# Patient Record
Sex: Female | Born: 1955 | Race: Black or African American | Hispanic: No | Marital: Single | Smoking: Never smoker
Health system: Southern US, Community
[De-identification: ages and names within clinical notes are randomized; demographics above are authoritative.]

## PROBLEM LIST (undated history)

## (undated) DIAGNOSIS — R569 Unspecified convulsions: Secondary | ICD-10-CM

## (undated) DIAGNOSIS — I10 Essential (primary) hypertension: Secondary | ICD-10-CM

## (undated) DIAGNOSIS — R4182 Altered mental status, unspecified: Secondary | ICD-10-CM

## (undated) DIAGNOSIS — I639 Cerebral infarction, unspecified: Secondary | ICD-10-CM

## (undated) DIAGNOSIS — R531 Weakness: Secondary | ICD-10-CM

## (undated) DIAGNOSIS — G4700 Insomnia, unspecified: Secondary | ICD-10-CM

## (undated) HISTORY — DX: Altered mental status, unspecified: R41.82

## (undated) HISTORY — DX: Unspecified convulsions: R56.9

## (undated) HISTORY — DX: Essential (primary) hypertension: I10

## (undated) HISTORY — DX: Insomnia, unspecified: G47.00

## (undated) HISTORY — DX: Weakness: R53.1

## (undated) HISTORY — DX: Cerebral infarction, unspecified: I63.9

---

## 2006-11-25 ENCOUNTER — Emergency Department (HOSPITAL_COMMUNITY): Admission: EM | Admit: 2006-11-25 | Discharge: 2006-11-25 | Payer: Self-pay | Admitting: Emergency Medicine

## 2006-12-02 ENCOUNTER — Emergency Department (HOSPITAL_COMMUNITY): Admission: EM | Admit: 2006-12-02 | Discharge: 2006-12-02 | Payer: Self-pay | Admitting: Family Medicine

## 2011-01-12 LAB — POCT I-STAT CREATININE: Creatinine, Ser: 0.9

## 2011-01-12 LAB — I-STAT 8, (EC8 V) (CONVERTED LAB)
Chloride: 105
HCT: 42
Hemoglobin: 14.3
Operator id: 294511
Potassium: 3.3 — ABNORMAL LOW

## 2013-04-08 ENCOUNTER — Emergency Department (HOSPITAL_COMMUNITY)
Admission: EM | Admit: 2013-04-08 | Discharge: 2013-04-08 | Disposition: A | Discharge status: Home / Self Care | Payer: No Typology Code available for payment source | Attending: Emergency Medicine | Admitting: Emergency Medicine

## 2013-04-08 DIAGNOSIS — S0993XA Unspecified injury of face, initial encounter: Secondary | ICD-10-CM | POA: Insufficient documentation

## 2013-04-08 DIAGNOSIS — S199XXA Unspecified injury of neck, initial encounter: Principal | ICD-10-CM

## 2013-04-08 DIAGNOSIS — Y92410 Unspecified street and highway as the place of occurrence of the external cause: Secondary | ICD-10-CM | POA: Insufficient documentation

## 2013-04-08 DIAGNOSIS — Y9389 Activity, other specified: Secondary | ICD-10-CM | POA: Insufficient documentation

## 2013-04-08 MED ORDER — NAPROXEN 250 MG PO TABS
500.0000 mg | ORAL_TABLET | Freq: Once | ORAL | Status: AC
  Administered 2013-04-08: 500 mg via ORAL
  Filled 2013-04-08: qty 2
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
## 2013-04-09 NOTE — ED Provider Notes (Signed)
Medical screening examination/treatment/procedure(s) were performed by non-physician practitioner and as supervising physician I was immediately available for consultation/collaboration.  EKG Interpretation   None         Candyce ChurnJohn David Navjot Loera, MD 04/09/13 1454

## 2013-04-15 ENCOUNTER — Other Ambulatory Visit (HOSPITAL_COMMUNITY): Payer: Self-pay | Admitting: Chiropractic Medicine

## 2013-04-15 ENCOUNTER — Ambulatory Visit (HOSPITAL_COMMUNITY)
Admission: RE | Admit: 2013-04-15 | Discharge: 2013-04-15 | Disposition: A | Discharge status: Home / Self Care | Payer: Self-pay | Source: Ambulatory Visit | Attending: Chiropractic Medicine | Admitting: Chiropractic Medicine

## 2013-04-15 DIAGNOSIS — M542 Cervicalgia: Secondary | ICD-10-CM

## 2013-04-15 DIAGNOSIS — M47812 Spondylosis without myelopathy or radiculopathy, cervical region: Secondary | ICD-10-CM | POA: Insufficient documentation

## 2013-04-15 DIAGNOSIS — M4310 Spondylolisthesis, site unspecified: Secondary | ICD-10-CM | POA: Insufficient documentation

## 2013-10-08 ENCOUNTER — Encounter (HOSPITAL_COMMUNITY): Payer: Self-pay | Admitting: Emergency Medicine

## 2013-10-08 DIAGNOSIS — M25569 Pain in unspecified knee: Secondary | ICD-10-CM | POA: Insufficient documentation

## 2013-10-08 DIAGNOSIS — I10 Essential (primary) hypertension: Secondary | ICD-10-CM | POA: Insufficient documentation

## 2013-10-08 NOTE — ED Notes (Signed)
Pt. reports chronic pain at back of left knee for several years worse these past several days , denies injury / no fall , ambulatory.

## 2013-10-09 ENCOUNTER — Emergency Department (HOSPITAL_COMMUNITY)
Admission: EM | Admit: 2013-10-09 | Discharge: 2013-10-09 | Disposition: A | Discharge status: Home / Self Care | Payer: BC Managed Care – PPO | Attending: Emergency Medicine | Admitting: Emergency Medicine

## 2013-10-09 DIAGNOSIS — M25562 Pain in left knee: Secondary | ICD-10-CM

## 2013-10-09 DIAGNOSIS — I10 Essential (primary) hypertension: Secondary | ICD-10-CM

## 2013-10-09 MED ORDER — ACETAMINOPHEN 500 MG PO TABS: 500.0000 mg | ORAL_TABLET | Freq: Four times a day (QID) | ORAL | Status: DC | PRN

## 2013-10-09 MED ORDER — ACETAMINOPHEN 500 MG PO TABS
1000.0000 mg | ORAL_TABLET | Freq: Once | ORAL | Status: AC
  Administered 2013-10-09: 1000 mg via ORAL
  Filled 2013-10-09: qty 2

## 2013-10-09 NOTE — Discharge Instructions (Signed)
Hypertension Hypertension is another name for high blood pressure. High blood pressure forces your heart to work harder to pump blood. A blood pressure reading has two numbers, which includes a higher number over a lower number (example: 110/72). HOME CARE   Have your blood pressure rechecked by your doctor.  Only take medicine as told by your doctor. Follow the directions carefully. The medicine does not work as well if you skip doses. Skipping doses also puts you at risk for problems.  Do not smoke.  Monitor your blood pressure at home as told by your doctor. GET HELP IF:  You think you are having a reaction to the medicine you are taking.  You have repeat headaches or feel dizzy.  You have puffiness (swelling) in your ankles.  You have trouble with your vision. GET HELP RIGHT AWAY IF:   You get a very bad headache and are confused.  You feel weak, numb, or faint.  You get chest or belly (abdominal) pain.  You throw up (vomit).  You cannot breathe very well. MAKE SURE YOU:   Understand these instructions.  Will watch your condition.  Will get help right away if you are not doing well or get worse. Document Released: 09/05/2007 Document Revised: 03/24/2013 Document Reviewed: 01/09/2013 [facility] Patient Information 2015 [place], [place]. This information is not intended to replace advice given to you by your health care provider. Make sure you discuss any questions you have with your health care provider.  Knee Pain Knee pain can be a result of an injury or other medical conditions. Treatment will depend on the cause of your pain. HOME CARE  Only take medicine as told by your doctor.  Keep a healthy weight. Being overweight can make the knee hurt more.  Stretch before exercising or playing sports.  If there is constant knee pain, change the way you exercise. Ask your doctor for advice.  Make sure shoes fit well. Choose the right shoe for the sport or  activity.  Protect your knees. Wear kneepads if needed.  Rest when you are tired. GET HELP RIGHT AWAY IF:   Your knee pain does not stop.  Your knee pain does not get better.  Your knee joint feels hot to the touch.  You have a fever. MAKE SURE YOU:   Understand these instructions.  Will watch this condition.  Will get help right away if you are not doing well or get worse. Document Released: 06/15/2008 Document Revised: 06/11/2011 Document Reviewed: 06/15/2008 [facility]ExitCare Patient Information 2015 [place]Care, [place]LLC. This information is not intended to replace advice given to you by your health care provider. Make sure you discuss any questions you have with your health care provider.

## 2013-10-09 NOTE — ED Provider Notes (Signed)
CSN: [id number]     Arrival date & time 10/08/13  2316 History   First MD Initiated Contact with Patient 10/09/13 0024     Chief Complaint  Patient presents with  . Leg Pain     (Consider location/radiation/quality/duration/timing/severity/associated sxs/prior Treatment) HPI Comments: Patient is an otherwise healthy 58 year old female who presents today with left posterior knee pain. She reports the pain started yesterday. There was no trauma to the area. The pain is worse with movement, walking, and palpation. This is the same pain she had many years ago. She took tylenol with relief of her symptoms. The pain is dull and achy. No prior history of DVT or PE, no long trips or recent surgeries, no exogenous estrogen, no history of cancer. Patient denies shortness of breath, chest pain, fevers, chills, paresthesias, headache. Patient also was noted to be very hypertensive in triage. Patient reports she does not have history of hypertension, but believes today it is due to her eating a large amount of salt prior to arrival. No CP, SOB, lightheadedness, dizziness, headache.  The history is provided by the patient. No language interpreter was used.    History reviewed. No pertinent past medical history. Past Surgical History  Procedure Laterality Date  . Cesarean section     No family history on file. History  Substance Use Topics  . Smoking status: Never Smoker   . Smokeless tobacco: Not on file  . Alcohol Use: No   OB History   Grav Para Term Preterm Abortions TAB SAB Ect Mult Living                 Review of Systems  Constitutional: Negative for fever and chills.  Respiratory: Negative for shortness of breath.   Cardiovascular: Negative for chest pain and leg swelling.  Musculoskeletal: Positive for arthralgias.  Neurological: Negative for dizziness, weakness, light-headedness, numbness and headaches.  All other systems reviewed and are negative.     Allergies  Review of  patient's allergies indicates no known allergies.  Home Medications   Prior to Admission medications   Medication Sig Start Date End Date Taking? Authorizing Provider  Aspirin-Salicylamide-Caffeine (BC HEADACHE POWDER PO) Take 1 packet by mouth daily as needed (headache).    Historical Provider, MD   BP 177/100  Pulse 107  Temp(Src) 97.1 F (36.2 C) (Oral)  Resp 18  Ht 5\' 1"  (1.549 m)  Wt 163 lb (73.936 kg)  BMI 30.81 kg/m2  SpO2 100% Physical Exam  Nursing note and vitals reviewed. Constitutional: She is oriented to person, place, and time. She appears well-developed and well-nourished. No distress.  HENT:  Head: Normocephalic and atraumatic.  Right Ear: External ear normal.  Left Ear: External ear normal.  Nose: Nose normal.  Mouth/Throat: Oropharynx is clear and moist.  Eyes: Conjunctivae and EOM are normal. Pupils are equal, round, and reactive to light.  Neck: Normal range of motion. No rigidity.  Cardiovascular: Normal rate, regular rhythm, normal heart sounds, intact distal pulses and normal pulses.   Pulses:      Radial pulses are 2+ on the right side, and 2+ on the left side.       Dorsalis pedis pulses are 2+ on the right side, and 2+ on the left side.       Posterior tibial pulses are 2+ on the right side, and 2+ on the left side.  Pulmonary/Chest: Effort normal and breath sounds normal. No stridor. No respiratory distress. She has no wheezes. She has  no rales.  Abdominal: Soft. She exhibits no distension.  Musculoskeletal: Normal range of motion.  Strength 5/5 in lower extremities bilaterally Tender to palpation to posterior left knee. Joint stable, neurovascularly intact. Compartment soft. No erythema or lesion No calf tenderness or leg swelling.   Neurological: She is alert and oriented to person, place, and time. She has normal strength. No sensory deficit. Coordination and gait normal.  Normal gait  Skin: Skin is warm and dry. She is not diaphoretic. No  erythema.  Psychiatric: She has a normal mood and affect. Her behavior is normal.    ED Course  Procedures (including critical care time) Labs Review Labs Reviewed - No data to display  Imaging Review No results found.   EKG Interpretation None      MDM   Final diagnoses:  Left knee pain  Essential hypertension    Patient presents to ED for evaluation of left knee pain. She denies injury or trauma to the area. There is no associated swelling. Normal gait. Patient is Well's negative for DVT. Pain improves with tylenol. I do not believe further imaging will benefit this patient at this time. She was given tylenol in the ED. She requested note for work which was granted. Patient also with hypertension. Patient is asymptomatic. Neuro exam is non focal. I had a lengthy discussion with patient about hypertension and the potential for untreated hypertension to be fatal. She was given a resource guide and referral to the Health and [employer]. She does not have previously documented hypertension and believes her currently hypertension is due to eating a large amount of salt prior to arrival. Discussed low salt diet and the importance of follow up. Discussed reasons to return to ED immediately. Vital signs stable for discharge. Patient / Family / Caregiver informed of clinical course, understand medical decision-making process, and agree with plan.    Mora Bellman, PA-C 10/09/13 1310

## 2013-10-19 NOTE — ED Provider Notes (Signed)
Medical screening examination/treatment/procedure(s) were performed by non-physician practitioner and as supervising physician I was immediately available for consultation/collaboration.   EKG Interpretation None        Julie Manly, MD 10/19/13 0756 

## 2017-08-14 ENCOUNTER — Emergency Department (HOSPITAL_COMMUNITY): Payer: Medicaid Other

## 2017-08-14 ENCOUNTER — Encounter (HOSPITAL_COMMUNITY): Payer: Self-pay

## 2017-08-14 ENCOUNTER — Inpatient Hospital Stay (HOSPITAL_COMMUNITY)
Admission: EM | Admit: 2017-08-14 | Discharge: 2017-09-03 | DRG: 020 | Disposition: A | Discharge status: Rehabilitation Facility | Payer: Medicaid Other | Attending: Neurosurgery | Admitting: Neurosurgery

## 2017-08-14 ENCOUNTER — Inpatient Hospital Stay (HOSPITAL_COMMUNITY): Payer: Medicaid Other

## 2017-08-14 ENCOUNTER — Inpatient Hospital Stay (HOSPITAL_COMMUNITY): Payer: Medicaid Other | Admitting: Anesthesiology

## 2017-08-14 ENCOUNTER — Inpatient Hospital Stay (HOSPITAL_COMMUNITY)
Admission: EM | Disposition: A | Discharge status: Rehabilitation Facility | Payer: Self-pay | Source: Home / Self Care | Attending: Neurosurgery

## 2017-08-14 ENCOUNTER — Other Ambulatory Visit: Payer: Self-pay

## 2017-08-14 DIAGNOSIS — I619 Nontraumatic intracerebral hemorrhage, unspecified: Secondary | ICD-10-CM | POA: Diagnosis present

## 2017-08-14 DIAGNOSIS — R402362 Coma scale, best motor response, obeys commands, at arrival to emergency department: Secondary | ICD-10-CM | POA: Diagnosis present

## 2017-08-14 DIAGNOSIS — E876 Hypokalemia: Secondary | ICD-10-CM | POA: Diagnosis not present

## 2017-08-14 DIAGNOSIS — E870 Hyperosmolality and hypernatremia: Secondary | ICD-10-CM | POA: Diagnosis not present

## 2017-08-14 DIAGNOSIS — J9811 Atelectasis: Secondary | ICD-10-CM | POA: Diagnosis not present

## 2017-08-14 DIAGNOSIS — R739 Hyperglycemia, unspecified: Secondary | ICD-10-CM | POA: Diagnosis not present

## 2017-08-14 DIAGNOSIS — I63332 Cerebral infarction due to thrombosis of left posterior cerebral artery: Secondary | ICD-10-CM | POA: Diagnosis present

## 2017-08-14 DIAGNOSIS — D6489 Other specified anemias: Secondary | ICD-10-CM | POA: Diagnosis present

## 2017-08-14 DIAGNOSIS — J9601 Acute respiratory failure with hypoxia: Secondary | ICD-10-CM

## 2017-08-14 DIAGNOSIS — D62 Acute posthemorrhagic anemia: Secondary | ICD-10-CM | POA: Diagnosis present

## 2017-08-14 DIAGNOSIS — I6032 Nontraumatic subarachnoid hemorrhage from left posterior communicating artery: Secondary | ICD-10-CM | POA: Diagnosis present

## 2017-08-14 DIAGNOSIS — G919 Hydrocephalus, unspecified: Secondary | ICD-10-CM | POA: Diagnosis present

## 2017-08-14 DIAGNOSIS — Y92234 Operating room of hospital as the place of occurrence of the external cause: Secondary | ICD-10-CM | POA: Diagnosis not present

## 2017-08-14 DIAGNOSIS — I609 Nontraumatic subarachnoid hemorrhage, unspecified: Secondary | ICD-10-CM

## 2017-08-14 DIAGNOSIS — H02402 Unspecified ptosis of left eyelid: Secondary | ICD-10-CM | POA: Diagnosis not present

## 2017-08-14 DIAGNOSIS — R402232 Coma scale, best verbal response, inappropriate words, at arrival to emergency department: Secondary | ICD-10-CM | POA: Diagnosis present

## 2017-08-14 DIAGNOSIS — R402132 Coma scale, eyes open, to sound, at arrival to emergency department: Secondary | ICD-10-CM | POA: Diagnosis present

## 2017-08-14 DIAGNOSIS — I611 Nontraumatic intracerebral hemorrhage in hemisphere, cortical: Secondary | ICD-10-CM

## 2017-08-14 DIAGNOSIS — G9389 Other specified disorders of brain: Secondary | ICD-10-CM | POA: Diagnosis present

## 2017-08-14 DIAGNOSIS — I639 Cerebral infarction, unspecified: Secondary | ICD-10-CM | POA: Diagnosis not present

## 2017-08-14 DIAGNOSIS — Z4659 Encounter for fitting and adjustment of other gastrointestinal appliance and device: Secondary | ICD-10-CM

## 2017-08-14 DIAGNOSIS — R7303 Prediabetes: Secondary | ICD-10-CM

## 2017-08-14 DIAGNOSIS — R0682 Tachypnea, not elsewhere classified: Secondary | ICD-10-CM

## 2017-08-14 DIAGNOSIS — E46 Unspecified protein-calorie malnutrition: Secondary | ICD-10-CM | POA: Diagnosis not present

## 2017-08-14 DIAGNOSIS — R000 Tachycardia, unspecified: Secondary | ICD-10-CM

## 2017-08-14 DIAGNOSIS — D649 Anemia, unspecified: Secondary | ICD-10-CM | POA: Diagnosis not present

## 2017-08-14 DIAGNOSIS — S06320A Contusion and laceration of left cerebrum without loss of consciousness, initial encounter: Secondary | ICD-10-CM

## 2017-08-14 DIAGNOSIS — N390 Urinary tract infection, site not specified: Secondary | ICD-10-CM | POA: Diagnosis not present

## 2017-08-14 DIAGNOSIS — I10 Essential (primary) hypertension: Secondary | ICD-10-CM | POA: Diagnosis present

## 2017-08-14 DIAGNOSIS — D72829 Elevated white blood cell count, unspecified: Secondary | ICD-10-CM

## 2017-08-14 DIAGNOSIS — R4702 Dysphasia: Secondary | ICD-10-CM | POA: Diagnosis not present

## 2017-08-14 DIAGNOSIS — R451 Restlessness and agitation: Secondary | ICD-10-CM | POA: Diagnosis not present

## 2017-08-14 DIAGNOSIS — S06350A Traumatic hemorrhage of left cerebrum without loss of consciousness, initial encounter: Secondary | ICD-10-CM

## 2017-08-14 DIAGNOSIS — G8191 Hemiplegia, unspecified affecting right dominant side: Secondary | ICD-10-CM | POA: Diagnosis present

## 2017-08-14 DIAGNOSIS — G935 Compression of brain: Secondary | ICD-10-CM | POA: Diagnosis present

## 2017-08-14 DIAGNOSIS — B9620 Unspecified Escherichia coli [E. coli] as the cause of diseases classified elsewhere: Secondary | ICD-10-CM | POA: Diagnosis present

## 2017-08-14 DIAGNOSIS — I69151 Hemiplegia and hemiparesis following nontraumatic intracerebral hemorrhage affecting right dominant side: Secondary | ICD-10-CM | POA: Diagnosis not present

## 2017-08-14 DIAGNOSIS — I739 Peripheral vascular disease, unspecified: Secondary | ICD-10-CM | POA: Diagnosis not present

## 2017-08-14 DIAGNOSIS — I97811 Intraoperative cerebrovascular infarction during other surgery: Secondary | ICD-10-CM | POA: Diagnosis not present

## 2017-08-14 DIAGNOSIS — R29810 Facial weakness: Secondary | ICD-10-CM | POA: Diagnosis present

## 2017-08-14 DIAGNOSIS — Y838 Other surgical procedures as the cause of abnormal reaction of the patient, or of later complication, without mention of misadventure at the time of the procedure: Secondary | ICD-10-CM | POA: Diagnosis not present

## 2017-08-14 DIAGNOSIS — R509 Fever, unspecified: Secondary | ICD-10-CM

## 2017-08-14 DIAGNOSIS — I615 Nontraumatic intracerebral hemorrhage, intraventricular: Secondary | ICD-10-CM | POA: Diagnosis present

## 2017-08-14 DIAGNOSIS — I161 Hypertensive emergency: Secondary | ICD-10-CM | POA: Diagnosis present

## 2017-08-14 DIAGNOSIS — R29721 NIHSS score 21: Secondary | ICD-10-CM | POA: Diagnosis present

## 2017-08-14 DIAGNOSIS — G936 Cerebral edema: Secondary | ICD-10-CM | POA: Diagnosis present

## 2017-08-14 DIAGNOSIS — F419 Anxiety disorder, unspecified: Secondary | ICD-10-CM | POA: Diagnosis not present

## 2017-08-14 DIAGNOSIS — R1310 Dysphagia, unspecified: Secondary | ICD-10-CM | POA: Diagnosis not present

## 2017-08-14 DIAGNOSIS — R4701 Aphasia: Secondary | ICD-10-CM | POA: Diagnosis not present

## 2017-08-14 HISTORY — PX: CRANIOTOMY: SHX93

## 2017-08-14 LAB — DIFFERENTIAL
Band Neutrophils: 1 %
Basophils Absolute: 0 10*3/uL (ref 0.0–0.1)
Basophils Relative: 0 %
Blasts: 0 %
Eosinophils Absolute: 0.1 10*3/uL (ref 0.0–0.7)
Eosinophils Relative: 1 %
LYMPHS ABS: 6.6 10*3/uL — AB (ref 0.7–4.0)
Lymphocytes Relative: 53 %
MYELOCYTES: 0 %
Metamyelocytes Relative: 0 %
Monocytes Absolute: 0.3 10*3/uL (ref 0.1–1.0)
Monocytes Relative: 2 %
NRBC: 0 /100{WBCs}
Neutro Abs: 5.5 10*3/uL (ref 1.7–7.7)
Neutrophils Relative %: 43 %
Other: 0 %
Promyelocytes Relative: 0 %

## 2017-08-14 LAB — COMPREHENSIVE METABOLIC PANEL
ALT: 16 U/L (ref 14–54)
AST: 22 U/L (ref 15–41)
Albumin: 3.9 g/dL (ref 3.5–5.0)
Alkaline Phosphatase: 66 U/L (ref 38–126)
Anion gap: 12 (ref 5–15)
BILIRUBIN TOTAL: 0.6 mg/dL (ref 0.3–1.2)
BUN: 7 mg/dL (ref 6–20)
CO2: 22 mmol/L (ref 22–32)
CREATININE: 0.86 mg/dL (ref 0.44–1.00)
Calcium: 9.5 mg/dL (ref 8.9–10.3)
Chloride: 105 mmol/L (ref 101–111)
GFR calc Af Amer: >60 mL/min (ref >60)
Glucose, Bld: 188 mg/dL — ABNORMAL HIGH (ref 65–99)
POTASSIUM: 2.9 mmol/L — AB (ref 3.5–5.1)
Sodium: 139 mmol/L (ref 135–145)
TOTAL PROTEIN: 7.5 g/dL (ref 6.5–8.1)

## 2017-08-14 LAB — RAPID URINE DRUG SCREEN, HOSP PERFORMED
Amphetamines: NOT DETECTED
Barbiturates: NOT DETECTED
Benzodiazepines: NOT DETECTED
Cocaine: NOT DETECTED
Opiates: NOT DETECTED
Tetrahydrocannabinol: NOT DETECTED

## 2017-08-14 LAB — I-STAT TROPONIN, ED: TROPONIN I, POC: 0 ng/mL (ref 0.00–0.08)

## 2017-08-14 LAB — URINALYSIS, ROUTINE W REFLEX MICROSCOPIC
BACTERIA UA: NONE SEEN
Bilirubin Urine: NEGATIVE
Glucose, UA: 150 mg/dL — AB
KETONES UR: 5 mg/dL — AB
LEUKOCYTES UA: NEGATIVE
NITRITE: NEGATIVE
PROTEIN: NEGATIVE mg/dL
Specific Gravity, Urine: 1.008 (ref 1.005–1.030)
pH: 6 (ref 5.0–8.0)

## 2017-08-14 LAB — CBC
HEMATOCRIT: 35.8 % — AB (ref 36.0–46.0)
HEMOGLOBIN: 11.8 g/dL — AB (ref 12.0–15.0)
MCH: 29.2 pg (ref 26.0–34.0)
MCHC: 33 g/dL (ref 30.0–36.0)
MCV: 88.6 fL (ref 78.0–100.0)
Platelets: 281 10*3/uL (ref 150–400)
RBC: 4.04 MIL/uL (ref 3.87–5.11)
RDW: 13.9 % (ref 11.5–15.5)
WBC: 12.5 10*3/uL — ABNORMAL HIGH (ref 4.0–10.5)

## 2017-08-14 LAB — I-STAT CHEM 8, ED
BUN: 7 mg/dL (ref 6–20)
CHLORIDE: 106 mmol/L (ref 101–111)
Calcium, Ion: 1.22 mmol/L (ref 1.15–1.40)
Creatinine, Ser: 0.6 mg/dL (ref 0.44–1.00)
GLUCOSE: 189 mg/dL — AB (ref 65–99)
HEMATOCRIT: 37 % (ref 36.0–46.0)
Hemoglobin: 12.6 g/dL (ref 12.0–15.0)
POTASSIUM: 2.9 mmol/L — AB (ref 3.5–5.1)
SODIUM: 143 mmol/L (ref 135–145)
TCO2: 24 mmol/L (ref 22–32)

## 2017-08-14 LAB — BASIC METABOLIC PANEL
ANION GAP: 11 (ref 5–15)
BUN: 5 mg/dL — ABNORMAL LOW (ref 6–20)
CO2: 21 mmol/L — AB (ref 22–32)
Calcium: 8.7 mg/dL — ABNORMAL LOW (ref 8.9–10.3)
Chloride: 111 mmol/L (ref 101–111)
Creatinine, Ser: 0.7 mg/dL (ref 0.44–1.00)
GFR calc non Af Amer: >60 mL/min (ref >60)
Glucose, Bld: 186 mg/dL — ABNORMAL HIGH (ref 65–99)
POTASSIUM: 3.6 mmol/L (ref 3.5–5.1)
Sodium: 143 mmol/L (ref 135–145)

## 2017-08-14 LAB — CBG MONITORING, ED: GLUCOSE-CAPILLARY: 223 mg/dL — AB (ref 65–99)

## 2017-08-14 LAB — GLUCOSE, CAPILLARY
GLUCOSE-CAPILLARY: 149 mg/dL — AB (ref 65–99)
GLUCOSE-CAPILLARY: 141 mg/dL — AB (ref 65–99)

## 2017-08-14 LAB — MRSA PCR SCREENING: MRSA BY PCR: NEGATIVE

## 2017-08-14 LAB — TRIGLYCERIDES: Triglycerides: 41 mg/dL (ref <150)

## 2017-08-14 LAB — ABO/RH: ABO/RH(D): B POS

## 2017-08-14 LAB — APTT: APTT: 25 s (ref 24–36)

## 2017-08-14 LAB — SURGICAL PCR SCREEN
MRSA, PCR: NEGATIVE
Staphylococcus aureus: NEGATIVE

## 2017-08-14 LAB — PREPARE RBC (CROSSMATCH)

## 2017-08-14 LAB — PROTIME-INR
INR: 1.01
Prothrombin Time: 13.2 seconds (ref 11.4–15.2)

## 2017-08-14 LAB — ETHANOL

## 2017-08-14 SURGERY — CRANIOTOMY INTRACRANIAL ANEURYSM FOR CAROTID
Anesthesia: General | Site: Head | Laterality: Left

## 2017-08-14 MED ORDER — EPHEDRINE SULFATE 50 MG/ML IJ SOLN
INTRAMUSCULAR | Status: AC
  Filled 2017-08-14: qty 1

## 2017-08-14 MED ORDER — ALBUMIN HUMAN 5 % IV SOLN
INTRAVENOUS | Status: DC | PRN
  Administered 2017-08-14: 14:00:00 via INTRAVENOUS

## 2017-08-14 MED ORDER — SODIUM CHLORIDE 0.9 % IV SOLN
INTRAVENOUS | Status: DC | PRN
  Administered 2017-08-14: 1000 mg via INTRAVENOUS

## 2017-08-14 MED ORDER — SUGAMMADEX SODIUM 200 MG/2ML IV SOLN
INTRAVENOUS | Status: AC
  Filled 2017-08-14: qty 2

## 2017-08-14 MED ORDER — DEXAMETHASONE SODIUM PHOSPHATE 10 MG/ML IJ SOLN
INTRAMUSCULAR | Status: AC
  Filled 2017-08-14: qty 1

## 2017-08-14 MED ORDER — 0.9 % SODIUM CHLORIDE (POUR BTL) OPTIME
TOPICAL | Status: DC | PRN
  Administered 2017-08-14 (×3): 1000 mL

## 2017-08-14 MED ORDER — ONDANSETRON HCL 4 MG/2ML IJ SOLN
INTRAMUSCULAR | Status: AC
  Filled 2017-08-14: qty 2

## 2017-08-14 MED ORDER — LIDOCAINE 2% (20 MG/ML) 5 ML SYRINGE
INTRAMUSCULAR | Status: AC
  Filled 2017-08-14: qty 5

## 2017-08-14 MED ORDER — ORAL CARE MOUTH RINSE
15.0000 mL | Freq: Four times a day (QID) | OROMUCOSAL | Status: DC
  Administered 2017-08-14 – 2017-08-15 (×2): 15 mL via OROMUCOSAL

## 2017-08-14 MED ORDER — SODIUM CHLORIDE 0.9 % IV SOLN
INTRAVENOUS | Status: DC | PRN
  Administered 2017-08-14: 500 mL

## 2017-08-14 MED ORDER — BACITRACIN ZINC 500 UNIT/GM EX OINT
TOPICAL_OINTMENT | CUTANEOUS | Status: DC | PRN
  Administered 2017-08-14: 1 via TOPICAL

## 2017-08-14 MED ORDER — ROCURONIUM BROMIDE 50 MG/5ML IV SOLN
INTRAVENOUS | Status: AC
  Filled 2017-08-14: qty 1

## 2017-08-14 MED ORDER — LABETALOL HCL 5 MG/ML IV SOLN
20.0000 mg | Freq: Once | INTRAVENOUS | Status: AC
  Administered 2017-08-14: 20 mg via INTRAVENOUS

## 2017-08-14 MED ORDER — SODIUM CHLORIDE 0.9 % IV SOLN
INTRAVENOUS | Status: DC
  Administered 2017-08-14 – 2017-08-19 (×8): via INTRAVENOUS

## 2017-08-14 MED ORDER — STROKE: EARLY STAGES OF RECOVERY BOOK
Freq: Once | Status: DC
  Filled 2017-08-14: qty 1

## 2017-08-14 MED ORDER — SUGAMMADEX SODIUM 500 MG/5ML IV SOLN
INTRAVENOUS | Status: DC | PRN
  Administered 2017-08-14: 139.6 mg via INTRAVENOUS

## 2017-08-14 MED ORDER — ACETAMINOPHEN 325 MG PO TABS: 650.0000 mg | ORAL_TABLET | ORAL | Status: DC | PRN

## 2017-08-14 MED ORDER — FENTANYL CITRATE (PF) 250 MCG/5ML IJ SOLN
INTRAMUSCULAR | Status: AC
  Filled 2017-08-14: qty 5

## 2017-08-14 MED ORDER — CEFAZOLIN SODIUM 1 G IJ SOLR
INTRAMUSCULAR | Status: AC
  Filled 2017-08-14: qty 20

## 2017-08-14 MED ORDER — ONDANSETRON HCL 4 MG/2ML IJ SOLN
INTRAMUSCULAR | Status: DC | PRN
  Administered 2017-08-14: 4 mg via INTRAVENOUS

## 2017-08-14 MED ORDER — CHLORHEXIDINE GLUCONATE 0.12% ORAL RINSE (MEDLINE KIT)
15.0000 mL | Freq: Two times a day (BID) | OROMUCOSAL | Status: DC
  Administered 2017-08-14 – 2017-08-29 (×29): 15 mL via OROMUCOSAL

## 2017-08-14 MED ORDER — DEXAMETHASONE SODIUM PHOSPHATE 10 MG/ML IJ SOLN
INTRAMUSCULAR | Status: DC | PRN
  Administered 2017-08-14: 10 mg via INTRAVENOUS

## 2017-08-14 MED ORDER — POTASSIUM CHLORIDE 10 MEQ/100ML IV SOLN
10.0000 meq | INTRAVENOUS | Status: AC
  Administered 2017-08-14 (×2): 10 meq via INTRAVENOUS
  Filled 2017-08-14 (×4): qty 100

## 2017-08-14 MED ORDER — FENTANYL CITRATE (PF) 100 MCG/2ML IJ SOLN
25.0000 ug | INTRAMUSCULAR | Status: DC | PRN
  Administered 2017-08-14 – 2017-09-03 (×2): 50 ug via INTRAVENOUS
  Filled 2017-08-14 (×4): qty 2

## 2017-08-14 MED ORDER — PHENYLEPHRINE 40 MCG/ML (10ML) SYRINGE FOR IV PUSH (FOR BLOOD PRESSURE SUPPORT)
PREFILLED_SYRINGE | INTRAVENOUS | Status: DC | PRN
  Administered 2017-08-14 (×2): 80 ug via INTRAVENOUS
  Administered 2017-08-14: 120 ug via INTRAVENOUS
  Administered 2017-08-14 (×4): 80 ug via INTRAVENOUS

## 2017-08-14 MED ORDER — THROMBIN (RECOMBINANT) 5000 UNITS EX SOLR
OROMUCOSAL | Status: DC | PRN
  Administered 2017-08-14: 5 mL via TOPICAL

## 2017-08-14 MED ORDER — ACETAMINOPHEN 160 MG/5ML PO SOLN
650.0000 mg | ORAL | Status: DC | PRN
  Administered 2017-08-16 – 2017-08-22 (×16): 650 mg
  Filled 2017-08-14 (×17): qty 20.3

## 2017-08-14 MED ORDER — SODIUM CHLORIDE 0.9 % IV SOLN
INTRAVENOUS | Status: DC | PRN
  Administered 2017-08-14 (×2): via INTRAVENOUS

## 2017-08-14 MED ORDER — BACITRACIN ZINC 500 UNIT/GM EX OINT
TOPICAL_OINTMENT | CUTANEOUS | Status: AC
  Filled 2017-08-14: qty 28.35

## 2017-08-14 MED ORDER — THROMBIN 20000 UNITS EX SOLR
CUTANEOUS | Status: AC
  Filled 2017-08-14: qty 20000

## 2017-08-14 MED ORDER — PROPOFOL 10 MG/ML IV BOLUS
INTRAVENOUS | Status: DC | PRN
  Administered 2017-08-14: 140 mg via INTRAVENOUS
  Administered 2017-08-14: 30 mg via INTRAVENOUS
  Administered 2017-08-14: 40 mg via INTRAVENOUS

## 2017-08-14 MED ORDER — BUPIVACAINE HCL 0.5 % IJ SOLN
INTRAMUSCULAR | Status: DC | PRN
  Administered 2017-08-14: 5 mL

## 2017-08-14 MED ORDER — HEMOSTATIC AGENTS (NO CHARGE) OPTIME
TOPICAL | Status: DC | PRN
  Administered 2017-08-14: 1 via TOPICAL

## 2017-08-14 MED ORDER — PROPOFOL 1000 MG/100ML IV EMUL: 5.0000 ug/kg/min | INTRAVENOUS | Status: DC

## 2017-08-14 MED ORDER — POTASSIUM CHLORIDE 10 MEQ/100ML IV SOLN
10.0000 meq | INTRAVENOUS | Status: AC
  Administered 2017-08-14 (×2): 10 meq via INTRAVENOUS
  Filled 2017-08-14 (×2): qty 100

## 2017-08-14 MED ORDER — LABETALOL HCL 5 MG/ML IV SOLN
INTRAVENOUS | Status: DC | PRN
  Administered 2017-08-14 (×2): 5 mg via INTRAVENOUS

## 2017-08-14 MED ORDER — POTASSIUM CHLORIDE 10 MEQ/100ML IV SOLN
INTRAVENOUS | Status: DC | PRN
Start: 2017-08-14 — End: 2017-08-14
  Administered 2017-08-14 (×2): 10 meq via INTRAVENOUS

## 2017-08-14 MED ORDER — LIDOCAINE HCL (PF) 1 % IJ SOLN
INTRAMUSCULAR | Status: AC
  Filled 2017-08-14: qty 30

## 2017-08-14 MED ORDER — ROCURONIUM BROMIDE 100 MG/10ML IV SOLN
INTRAVENOUS | Status: DC | PRN
  Administered 2017-08-14: 40 mg via INTRAVENOUS
  Administered 2017-08-14 (×2): 30 mg via INTRAVENOUS
  Administered 2017-08-14: 10 mg via INTRAVENOUS

## 2017-08-14 MED ORDER — SODIUM CHLORIDE 0.9 % IV SOLN
INTRAVENOUS | Status: DC
  Administered 2017-08-14: 17:00:00 via INTRAVENOUS

## 2017-08-14 MED ORDER — ACETAMINOPHEN 650 MG RE SUPP: 650.0000 mg | RECTAL | Status: DC | PRN

## 2017-08-14 MED ORDER — PHENYLEPHRINE 40 MCG/ML (10ML) SYRINGE FOR IV PUSH (FOR BLOOD PRESSURE SUPPORT)
PREFILLED_SYRINGE | INTRAVENOUS | Status: AC
  Filled 2017-08-14: qty 20

## 2017-08-14 MED ORDER — CEFAZOLIN SODIUM-DEXTROSE 2-3 GM-%(50ML) IV SOLR
INTRAVENOUS | Status: DC | PRN
  Administered 2017-08-14: 2 g via INTRAVENOUS

## 2017-08-14 MED ORDER — LABETALOL HCL 5 MG/ML IV SOLN
INTRAVENOUS | Status: AC
  Filled 2017-08-14: qty 4

## 2017-08-14 MED ORDER — SUCCINYLCHOLINE CHLORIDE 200 MG/10ML IV SOSY
PREFILLED_SYRINGE | INTRAVENOUS | Status: AC
  Filled 2017-08-14: qty 10

## 2017-08-14 MED ORDER — SODIUM CHLORIDE 0.9 % IV SOLN: Freq: Once | INTRAVENOUS | Status: DC

## 2017-08-14 MED ORDER — PROPOFOL 500 MG/50ML IV EMUL
INTRAVENOUS | Status: DC | PRN
  Administered 2017-08-14: 25 ug/kg/min via INTRAVENOUS

## 2017-08-14 MED ORDER — NICARDIPINE HCL IN NACL 20-0.86 MG/200ML-% IV SOLN
3.0000 mg/h | INTRAVENOUS | Status: DC
  Administered 2017-08-14: 5 mg/h via INTRAVENOUS
  Administered 2017-08-14: 7.5 mg/h via INTRAVENOUS
  Administered 2017-08-14: 5 mg/h via INTRAVENOUS
  Administered 2017-08-14: 10 mg/h via INTRAVENOUS
  Administered 2017-08-15: 7.5 mg/h via INTRAVENOUS
  Administered 2017-08-15: 15 mg/h via INTRAVENOUS
  Administered 2017-08-15: 12 mg/h via INTRAVENOUS
  Administered 2017-08-15: 10 mg/h via INTRAVENOUS
  Administered 2017-08-15: 15 mg/h via INTRAVENOUS
  Administered 2017-08-15: 10 mg/h via INTRAVENOUS
  Administered 2017-08-15: 15 mg/h via INTRAVENOUS
  Administered 2017-08-15: 12 mg/h via INTRAVENOUS
  Filled 2017-08-14 (×2): qty 200
  Filled 2017-08-14: qty 400
  Filled 2017-08-14 (×8): qty 200

## 2017-08-14 MED ORDER — IOPAMIDOL (ISOVUE-370) INJECTION 76%
INTRAVENOUS | Status: AC
  Administered 2017-08-14: 50 mL via INTRAVENOUS
  Filled 2017-08-14: qty 50

## 2017-08-14 MED ORDER — PHENYLEPHRINE HCL 10 MG/ML IJ SOLN
INTRAVENOUS | Status: DC | PRN
  Administered 2017-08-14: 30 ug/min via INTRAVENOUS

## 2017-08-14 MED ORDER — INDOCYANINE GREEN 25 MG IV SOLR
12.5000 mg | Freq: Once | INTRAVENOUS | Status: DC
  Filled 2017-08-14: qty 25

## 2017-08-14 MED ORDER — SUCCINYLCHOLINE CHLORIDE 20 MG/ML IJ SOLN
INTRAMUSCULAR | Status: DC | PRN
  Administered 2017-08-14: 120 mg via INTRAVENOUS

## 2017-08-14 MED ORDER — THROMBIN (RECOMBINANT) 20000 UNITS EX SOLR
CUTANEOUS | Status: DC | PRN
  Administered 2017-08-14: 20 mL via TOPICAL

## 2017-08-14 MED ORDER — MANNITOL 25 % IV SOLN
INTRAVENOUS | Status: DC | PRN
  Administered 2017-08-14: 50 g via INTRAVENOUS

## 2017-08-14 MED ORDER — FENTANYL CITRATE (PF) 100 MCG/2ML IJ SOLN
INTRAMUSCULAR | Status: DC | PRN
  Administered 2017-08-14 (×2): 100 ug via INTRAVENOUS
  Administered 2017-08-14: 50 ug via INTRAVENOUS

## 2017-08-14 MED ORDER — THROMBIN 5000 UNITS EX SOLR
CUTANEOUS | Status: AC
  Filled 2017-08-14: qty 5000

## 2017-08-14 MED ORDER — FAMOTIDINE IN NACL 20-0.9 MG/50ML-% IV SOLN
20.0000 mg | Freq: Two times a day (BID) | INTRAVENOUS | Status: DC
  Administered 2017-08-14 – 2017-08-16 (×4): 20 mg via INTRAVENOUS
  Filled 2017-08-14 (×4): qty 50

## 2017-08-14 MED ORDER — SENNOSIDES-DOCUSATE SODIUM 8.6-50 MG PO TABS
1.0000 | ORAL_TABLET | Freq: Two times a day (BID) | ORAL | Status: DC
  Administered 2017-08-15 – 2017-08-16 (×2): 1 via ORAL
  Filled 2017-08-14 (×3): qty 1

## 2017-08-14 MED ORDER — LIDOCAINE HCL (CARDIAC) PF 100 MG/5ML IV SOSY
PREFILLED_SYRINGE | INTRAVENOUS | Status: DC | PRN
  Administered 2017-08-14: 50 mg via INTRAVENOUS

## 2017-08-14 MED ORDER — LIDOCAINE HCL (PF) 1 % IJ SOLN
INTRAMUSCULAR | Status: DC | PRN
  Administered 2017-08-14: 5 mL

## 2017-08-14 MED ORDER — BUPIVACAINE HCL (PF) 0.5 % IJ SOLN
INTRAMUSCULAR | Status: AC
  Filled 2017-08-14: qty 30

## 2017-08-14 SURGICAL SUPPLY — 104 items
BAG DECANTER FOR FLEXI CONT (MISCELLANEOUS) ×3 IMPLANT
BENZOIN TINCTURE PRP APPL 2/3 (GAUZE/BANDAGES/DRESSINGS) IMPLANT
BIT DRILL WIRE PASS 1.3MM (BIT) IMPLANT
BLADE SAW GIGLI 16 STRL (MISCELLANEOUS) IMPLANT
BLADE SURG 15 STRL LF DISP TIS (BLADE) IMPLANT
BLADE SURG 15 STRL SS (BLADE)
BLADE ULTRA TIP 2M (BLADE) IMPLANT
BNDG GAUZE ELAST 4 BULKY (GAUZE/BANDAGES/DRESSINGS) ×6 IMPLANT
BNDG STRETCH 4X75 NS LF (GAUZE/BANDAGES/DRESSINGS) ×3 IMPLANT
BNDG STRETCH 4X75 STRL LF (GAUZE/BANDAGES/DRESSINGS) IMPLANT
BUR ACORN 6.0 PRECISION (BURR) ×2 IMPLANT
BUR ACORN 6.0MM PRECISION (BURR) ×1
BUR MATCHSTICK NEURO 3.0 LAGG (BURR) ×3 IMPLANT
BUR ROUND FLUTED 4 SOFT TCH (BURR) ×2 IMPLANT
BUR ROUND FLUTED 4MM SOFT TCH (BURR) ×1
BUR SPIRAL ROUTER 2.3 (BUR) ×2 IMPLANT
BUR SPIRAL ROUTER 2.3MM (BUR) ×1
CANISTER SUCT 3000ML PPV (MISCELLANEOUS) ×9 IMPLANT
CARTRIDGE OIL MAESTRO DRILL (MISCELLANEOUS) ×1 IMPLANT
CATH VENTRIC 35X38 W/TROCAR LG (CATHETERS) ×3 IMPLANT
CLIP ANEURY TI PERM MINI 6.6M (Clip) ×3 IMPLANT
CLIP VESOCCLUDE MED 6/CT (CLIP) IMPLANT
COVER MAYO STAND STRL (DRAPES) IMPLANT
DECANTER SPIKE VIAL GLASS SM (MISCELLANEOUS) ×3 IMPLANT
DIFFUSER DRILL AIR PNEUMATIC (MISCELLANEOUS) ×3 IMPLANT
DRAPE HALF SHEET 40X57 (DRAPES) ×3 IMPLANT
DRAPE MICROSCOPE LEICA (MISCELLANEOUS) ×3 IMPLANT
DRAPE NEUROLOGICAL W/INCISE (DRAPES) ×3 IMPLANT
DRAPE TABLE COVER HEAVY DUTY (DRAPES) ×3 IMPLANT
DRAPE WARM FLUID 44X44 (DRAPE) ×3 IMPLANT
DRESSING TELFA 8X10 (GAUZE/BANDAGES/DRESSINGS) ×3 IMPLANT
DRILL WIRE PASS 1.3MM (BIT)
DRSG ADAPTIC 3X8 NADH LF (GAUZE/BANDAGES/DRESSINGS) IMPLANT
DRSG TELFA 3X8 NADH (GAUZE/BANDAGES/DRESSINGS) IMPLANT
DURAMATRIX ONLAY 3X3 (Plate) ×3 IMPLANT
DURAPREP 6ML APPLICATOR 50/CS (WOUND CARE) ×3 IMPLANT
ELECT REM PT RETURN 9FT ADLT (ELECTROSURGICAL) ×3
ELECTRODE REM PT RTRN 9FT ADLT (ELECTROSURGICAL) ×1 IMPLANT
EVACUATOR SILICONE 100CC (DRAIN) IMPLANT
FORCEPS BIPOLAR SPETZLER 8 1.0 (NEUROSURGERY SUPPLIES) ×3 IMPLANT
GAUZE SPONGE 4X4 12PLY STRL (GAUZE/BANDAGES/DRESSINGS) IMPLANT
GAUZE SPONGE 4X4 16PLY XRAY LF (GAUZE/BANDAGES/DRESSINGS) ×3 IMPLANT
GLOVE BIO SURGEON STRL SZ7.5 (GLOVE) ×3 IMPLANT
GLOVE BIOGEL PI IND STRL 7.5 (GLOVE) ×3 IMPLANT
GLOVE BIOGEL PI IND STRL 8 (GLOVE) ×2 IMPLANT
GLOVE BIOGEL PI INDICATOR 7.5 (GLOVE) ×6
GLOVE BIOGEL PI INDICATOR 8 (GLOVE) ×4
GLOVE ECLIPSE 6.5 STRL STRAW (GLOVE) ×3 IMPLANT
GLOVE ECLIPSE 7.0 STRL STRAW (GLOVE) ×6 IMPLANT
GLOVE ECLIPSE 7.5 STRL STRAW (GLOVE) ×15 IMPLANT
GOWN STRL REUS W/ TWL LRG LVL3 (GOWN DISPOSABLE) ×3 IMPLANT
GOWN STRL REUS W/ TWL XL LVL3 (GOWN DISPOSABLE) IMPLANT
GOWN STRL REUS W/TWL 2XL LVL3 (GOWN DISPOSABLE) ×3 IMPLANT
GOWN STRL REUS W/TWL LRG LVL3 (GOWN DISPOSABLE) ×6
GOWN STRL REUS W/TWL XL LVL3 (GOWN DISPOSABLE)
HEMOSTAT POWDER KIT SURGIFOAM (HEMOSTASIS) ×3 IMPLANT
HEMOSTAT SURGICEL 2X14 (HEMOSTASIS) ×3 IMPLANT
HOOK DURA (MISCELLANEOUS) ×3 IMPLANT
HOOK DURA 1/2IN (MISCELLANEOUS) IMPLANT
KIT BASIN OR (CUSTOM PROCEDURE TRAY) ×3 IMPLANT
KIT DRAIN CSF ACCUDRAIN (MISCELLANEOUS) ×3 IMPLANT
KIT TURNOVER KIT B (KITS) ×3 IMPLANT
KNIFE ARACHNOID DISP AM-24-S (MISCELLANEOUS) ×3 IMPLANT
MARKER SKIN DUAL TIP RULER LAB (MISCELLANEOUS) ×3 IMPLANT
NEEDLE HYPO 22GX1.5 SAFETY (NEEDLE) ×3 IMPLANT
NEEDLE HYPO 25X1 1.5 SAFETY (NEEDLE) ×3 IMPLANT
NEEDLE SPNL 25GX3.5 QUINCKE BL (NEEDLE) IMPLANT
NS IRRIG 1000ML POUR BTL (IV SOLUTION) ×9 IMPLANT
OIL CARTRIDGE MAESTRO DRILL (MISCELLANEOUS) ×3
PACK CRANIOTOMY CUSTOM (CUSTOM PROCEDURE TRAY) ×3 IMPLANT
PAD ARMBOARD 7.5X6 YLW CONV (MISCELLANEOUS) ×9 IMPLANT
PATTIES SURGICAL .25X.25 (GAUZE/BANDAGES/DRESSINGS) IMPLANT
PATTIES SURGICAL .5 X.5 (GAUZE/BANDAGES/DRESSINGS) IMPLANT
PATTIES SURGICAL .5 X3 (DISPOSABLE) IMPLANT
PATTIES SURGICAL 1/4 X 3 (GAUZE/BANDAGES/DRESSINGS) IMPLANT
PATTIES SURGICAL 1X1 (DISPOSABLE) IMPLANT
PERFORATOR LRG  14-11MM (BIT)
PERFORATOR LRG 14-11MM (BIT) IMPLANT
PIN MAYFIELD SKULL DISP (PIN) ×3 IMPLANT
PLATE 1.5  2HOLE LNG NEURO (Plate) ×4 IMPLANT
PLATE 1.5 2HOLE LNG NEURO (Plate) ×2 IMPLANT
PLATE 1.5 4HOLE LONG STRAIGHT (Plate) ×3 IMPLANT
PLATE 1.5/0.5 4HOLE NEURO REC (Plate) ×3 IMPLANT
RUBBERBAND STERILE (MISCELLANEOUS) ×6 IMPLANT
SCREW SELF DRILL HT 1.5/4MM (Screw) ×30 IMPLANT
SPONGE NEURO XRAY DETECT 1X3 (DISPOSABLE) IMPLANT
SPONGE SURGIFOAM ABS GEL 100 (HEMOSTASIS) ×3 IMPLANT
STAPLER SKIN PROX WIDE 3.9 (STAPLE) ×3 IMPLANT
STAPLER VISISTAT 35W (STAPLE) ×3 IMPLANT
STOCKINETTE 6  STRL (DRAPES) ×2
STOCKINETTE 6 STRL (DRAPES) ×1 IMPLANT
SUT ETHILON 3 0 FSL (SUTURE) IMPLANT
SUT NURALON 4 0 TR CR/8 (SUTURE) ×6 IMPLANT
SUT VIC AB 0 CT1 18XCR BRD8 (SUTURE) ×2 IMPLANT
SUT VIC AB 0 CT1 8-18 (SUTURE) ×4
SUT VIC AB 3-0 SH 8-18 (SUTURE) ×6 IMPLANT
TAPE CLOTH 1X10 TAN NS (GAUZE/BANDAGES/DRESSINGS) IMPLANT
TIP NONSTICK .5MMX23CM (INSTRUMENTS)
TIP NONSTICK .5X23 (INSTRUMENTS) IMPLANT
TOWEL GREEN STERILE (TOWEL DISPOSABLE) ×3 IMPLANT
TOWEL GREEN STERILE FF (TOWEL DISPOSABLE) ×3 IMPLANT
TRAY FOLEY MTR SLVR 16FR STAT (SET/KITS/TRAYS/PACK) IMPLANT
UNDERPAD 30X30 (UNDERPADS AND DIAPERS) IMPLANT
WATER STERILE IRR 1000ML POUR (IV SOLUTION) ×3 IMPLANT

## 2017-08-14 NOTE — Consult Note (Addendum)
Chief Complaint   Chief Complaint  Patient presents with  . Code Stroke   HPI   HPI: Carla Bauer is a 62 y.o. female brought in as a code stroke. LKW 1030pm. Patient is confused. Unable to obtain any history.  Patient Active Problem List   Diagnosis Date Noted  . ICH (intracerebral hemorrhage) ([place]) 08/14/2017    PMH: History reviewed. No pertinent past medical history.  PSH: Past Surgical History:  Procedure Laterality Date  . CESAREAN SECTION       (Not in a hospital admission)  SH: Social History   Tobacco Use  . Smoking status: Never Smoker  Substance Use Topics  . Alcohol use: No  . Drug use: No    MEDS: Prior to Admission medications   Medication Sig Start Date End Date Taking? Authorizing Provider  acetaminophen (TYLENOL) 500 MG tablet Take 1 tablet (500 mg total) by mouth every 6 (six) hours as needed. Patient taking differently: Take 500 mg by mouth every 6 (six) hours as needed for mild pain.  10/09/13  Yes Cleatrice Burke, PA-C  Aspirin-Salicylamide-Caffeine (BC HEADACHE POWDER PO) Take 1 packet by mouth daily as needed (headache).   Yes [provider]    ALLERGY: No Known Allergies  Social History   Tobacco Use  . Smoking status: Never Smoker  Substance Use Topics  . Alcohol use: No     History reviewed. No pertinent family history.   ROS   ROS unable to obtain  Exam   Vitals:   08/14/17 0740 08/14/17 0745  BP: 129/75 125/80  Pulse: 92 91  Resp: (!) 22 (!) 21  Temp:    SpO2: 98% 96%   WDWN Drowsy but will awaken to follow commands with encouragement PERRL Sticks out tongue.  Able to lift BLE antigravity, left > right. Left grip strength normal.  Moves LLE well. Able to wiggle right toes and flex at knee.    Results - Imaging/Labs   Results for orders placed or performed during the hospital encounter of 08/14/17 (from the past 48 hour(s))  I-stat troponin, ED     Status: None   Collection Time: 08/14/17  6:45  AM  Result Value Ref Range   Troponin i, poc 0.00 0.00 - 0.08 ng/mL   Comment 3            Comment: Due to the release kinetics of cTnI, a negative result within the first hours of the onset of symptoms does not rule out myocardial infarction with certainty. If myocardial infarction is still suspected, repeat the test at appropriate intervals.   Ethanol     Status: None   Collection Time: 08/14/17  6:47 AM  Result Value Ref Range   Alcohol, Ethyl (B) <10 <10 mg/dL    Comment: (NOTE) Lowest detectable limit for serum alcohol is 10 mg/dL. For medical purposes only. Performed at [facility], [place] [address] [place], [place] [address]   I-Stat Chem 8, ED     Status: Abnormal   Collection Time: 08/14/17  6:48 AM  Result Value Ref Range   Sodium 143 135 - 145 mmol/L   Potassium 2.9 (L) 3.5 - 5.1 mmol/L   Chloride 106 101 - 111 mmol/L   BUN 7 6 - 20 mg/dL   Creatinine, Ser 0.60 0.44 - 1.00 mg/dL   Glucose, Bld 189 (H) 65 - 99 mg/dL   Calcium, Ion 1.22 1.15 - 1.40 mmol/L   TCO2 24 22 - 32 mmol/L  Hemoglobin 12.6 12.0 - 15.0 g/dL   HCT 37.0 36.0 - 46.0 %  Protime-INR     Status: None   Collection Time: 08/14/17  6:48 AM  Result Value Ref Range   Prothrombin Time 13.2 11.4 - 15.2 seconds   INR 1.01     Comment: Performed at [facility] [address] [place], [place] [address]  APTT     Status: None   Collection Time: 08/14/17  6:48 AM  Result Value Ref Range   aPTT 25 24 - 36 seconds    Comment: Performed at [facility] [address], [place], [place] [address]  CBC     Status: Abnormal   Collection Time: 08/14/17  6:48 AM  Result Value Ref Range   WBC 12.5 (H) 4.0 - 10.5 K/uL   RBC 4.04 3.87 - 5.11 MIL/uL   Hemoglobin 11.8 (L) 12.0 - 15.0 g/dL   HCT 35.8 (L) 36.0 - 46.0 %   MCV 88.6 78.0 - 100.0 fL   MCH 29.2 26.0 - 34.0 pg   MCHC 33.0 30.0 - 36.0 g/dL   RDW 13.9 11.5 - 15.5 %   Platelets 281 150 - 400 K/uL    Comment: Performed at [facility], [place] [address] [place], [place] [address]  Differential     Status: Abnormal   Collection Time: 08/14/17  6:48 AM  Result Value Ref Range   Neutrophils Relative % 43 %   Lymphocytes Relative 53 %   Monocytes Relative 2 %   Eosinophils Relative 1 %   Basophils Relative 0 %   Band Neutrophils 1 %   Metamyelocytes Relative 0 %   Myelocytes 0 %   Promyelocytes Relative 0 %   Blasts 0 %   nRBC 0 0 /100 WBC   Other 0 %   Neutro Abs 5.5 1.7 - 7.7 K/uL   Lymphs Abs 6.6 (H) 0.7 - 4.0 K/uL   Monocytes Absolute 0.3 0.1 - 1.0 K/uL   Eosinophils Absolute 0.1 0.0 - 0.7 K/uL   Basophils Absolute 0.0 0.0 - 0.1 K/uL   Smear Review MORPHOLOGY UNREMARKABLE     Comment: Performed at [facility], [place] [address] [place], [place] [address]  Comprehensive metabolic panel     Status: Abnormal   Collection Time: 08/14/17  6:48 AM  Result Value Ref Range   Sodium 139 135 - 145 mmol/L   Potassium 2.9 (L) 3.5 - 5.1 mmol/L   Chloride 105 101 - 111 mmol/L   CO2 22 22 - 32 mmol/L   Glucose, Bld 188 (H) 65 - 99 mg/dL   BUN 7 6 - 20 mg/dL   Creatinine, Ser 0.86 0.44 - 1.00 mg/dL   Calcium 9.5 8.9 - 10.3 mg/dL   Total Protein 7.5 6.5 - 8.1 g/dL   Albumin 3.9 3.5 - 5.0 g/dL   AST 22 15 - 41 U/L   ALT 16 14 - 54 U/L   Alkaline Phosphatase 66 38 - 126 U/L   Total Bilirubin 0.6 0.3 - 1.2 mg/dL   GFR calc non Af Amer >60 >60 mL/min   GFR calc Af Amer >60 >60 mL/min    Comment: (NOTE) The eGFR has been calculated using the CKD EPI equation. This calculation has not been validated in all clinical situations. eGFR's persistently <60 mL/min signify possible Chronic Kidney Disease.    Anion gap 12 5 - 15    Comment: Performed at [facility], [place] [address], [place], [place] [address]  CBG monitoring, ED     Status: Abnormal   Collection Time: 08/14/17  7:01 AM  Result Value Ref Range   Glucose-Capillary 223 (H) 65 - 99 mg/dL   Comment 1 Notify RN    Comment 2 Document in Chart      Ct Head Code Stroke Wo Contrast  Result Date: 08/14/2017 CLINICAL DATA:  Code stroke.  Flaccid right side. EXAM: CT HEAD WITHOUT CONTRAST TECHNIQUE: Contiguous axial images were obtained from the base of the skull through the vertex without intravenous contrast. COMPARISON:  None. FINDINGS: Brain: 4.2 x 3.4 x 2.1 cm (estimated volume 12 cc) intraparenchymal hematoma in the medial left temporal lobe with intraventricular penetration in filling of the left lateral ventricle with blood. Smaller amount of blood in the right lateral ventricle. Third ventricle and fourth ventricle are filled. Mild edema surrounding the left temporal lobe hematoma suggesting that this is not hyperacute. Mass effect with right-to-left shift of 11 mm. Otherwise, the brain shows water probably chronic small-vessel ischemic changes of the deep white matter. There is subdural blood along the convexity on the left with maximal thickness of 6 mm. Small amount of blood also along the left tentorium. Vascular: Normal Skull: Normal Sinuses/Orbits: Clear/normal Other: None ASPECTS ([facility] Stroke Program Early CT Score) - Ganglionic level infarction (caudate, lentiform nuclei, internal capsule, insula, M1-M3 cortex): 7 - Supraganglionic infarction (M4-M6 cortex): 3 Total score (0-10 with 10 being normal): 10 IMPRESSION: 1. Recent intraparenchymal hemorrhage affecting the mesial temporal lobe on the left with a hematoma 12 cc volume. Intraventricular penetration. Small amount of surrounding edema suggesting that this may be several hours to a day old. Mass effect with left-to-right shift of 11 mm. 6 mm subdural hematoma along the lateral convexity on the left with a small amount along the left tentorium. 2. ASPECTS is 10. 3. Dr. Leonel Ramsay aware of the results, with neuro surgery contact. Electronically Signed   By: Nelson Chimes M.D.   On: 08/14/2017 07:09    Impression/Plan   62 y.o. female with left temporal lobe intraparenchymal  hemorrhage with intraventricular extenision, 19m MLS and small left SDH. Neuro exam much improved compared to when she initially presented by report. Will hold on any acute NS intervention presently. The concern for clot evacuation on dominant hemispheric hemorrhage is high possibility of aphasia. Reviewed with attending, Dr NKathyrn Sheriff Will hold on any intervention presently. May consider EVD should she deteriorate.

## 2017-08-14 NOTE — H&P (Addendum)
Neurology H&P  CC: Right sided weakness  History is obtained from:patient  HPI: Carla Bauer is a 62 y.o. female with right sided weakness this am. She went to bed around 10:30 pm and then family heard her fall this am. They found her on the ground and she was noted to have right sided weakness and therefore she  Was activated as a code stroke. On arrival the patient was flaccid on the right, but actually had some improvement over the course of the ER stay.   LKW: 10:30 pm tpa given?: no, ich ICH Score: 1 MRS: 0  ROS:  Unable to obtain due to altered mental status.   PMH: None per family, took no meds   FHx: Unable to assess secondary to patient's altered mental status.    Social History:  reports that she has never smoked. She does not have any smokeless tobacco history on file. She reports that she does not drink alcohol or use drugs.  Exam: Current vital signs: BP (!) 170/92   Pulse (!) 104   Temp (!) 97 F (36.1 C) (Temporal)   Resp 20   SpO2 97%  Vital signs in last 24 hours: Temp:  [97 F (36.1 C)] 97 F (36.1 C) (05/15 0702) Pulse Rate:  [104] 104 (05/15 0702) Resp:  [20] 20 (05/15 0702) BP: (170)/(92) 170/92 (05/15 0702) SpO2:  [96 %-97 %] 97 % (05/15 0702)  Physical Exam  Constitutional: Appears well-developed and well-nourished.  Psych: Affect appropriate to situation Eyes: No scleral injection HENT: No OP obstrucion Head: Normocephalic.  Cardiovascular: Normal rate and regular rhythm.  Respiratory: Effort normal and breath sounds normal to anterior ascultation GI: Soft.  No distension. There is no tenderness.  Skin: WDI  Neuro: Mental Status: Patient is drowsy but awake, she follows command to stick out tongue  Cranial Nerves: II: blinks to threat from the left but not right. Pupils are equal, round, and reactive to light.   III,IV, VI: left gaze preference  V: Facial sensation is symmetric to temperature VII: Facial movement with left facial  droop.  VIII: hearing is intact to voice X: Uvula elevates symmetrically XI: Shoulder shrug is symmetric. XII: tongue is midline without atrophy or fasciculations.  Motor: She lifts both right arm and leg against gravity with 3/5 right arm, 4-/5 right leg. Moves left side with good strength.  Sensory: Sensation is diminished on the right Cerebellar: Does not perform   I have reviewed labs in epic and the results pertinent to this consultation are: Leukocytosis.  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 The location in concerning as I suspect that she may develop either compression of the brainstem, or hydrocephalus.   Recommendations: 1) Admit to ICU 2) no antiplatelets or anticoagulants 3) blood pressure control with goal systolic 120 - 140 4) Frequent neuro checks 5) If symptoms worsen or there is decreased mental status, repeat stat head CT 6) PT,OT,ST 7) Repeat CT with CTA at noon.  8) 40 mEQ potassium, recheck this afternoon.   This patient is critically ill and at significant risk of neurological worsening, death and care requires constant monitoring of vital signs, hemodynamics,respiratory and cardiac monitoring, neurological assessment, discussion with family, other specialists and medical decision  making of high complexity. I spent 60 minutes of neurocritical care time  in the care of  this patient.  Ritta Slot, MD [facility] [phone]  If 7pm- 7am, please page neurology on call as listed in AMION. 08/14/2017  7:04 AM

## 2017-08-14 NOTE — Progress Notes (Signed)
   08/14/17 0700  Clinical Encounter Type  Visited With Family  Visit Type Initial  Referral From Nurse  Consult/Referral To Chaplain  Spiritual Encounters  Spiritual Needs Emotional  Stress Factors  Family Stress Factors Health changes;Major life changes  Chaplain visited with the family in the consult room after being paged by the nurse.  The family was very upset and tearful.  Their loved one had taken a sudden turn.  Once getting an update from the Dr family had an opportunity to cry together as more family was arriving.  I get the sense that they are in a position where they wish they noticed an called sooner the Dr.  [name] is supportive of one another.  Chaplain was able to provide support and beverages to the family.

## 2017-08-14 NOTE — Progress Notes (Addendum)
STROKE TEAM PROGRESS NOTE  HPI ( Dr Amada Jupiter ) Arvid Right is a 62 y.o. female with right sided weakness this am. She went to bed around 10:30 pm and then family heard her fall this am. They found her on the ground and she was noted to have right sided weakness and therefore she  Was activated as a code stroke. On arrival the patient was flaccid on the right, but actually had some improvement over the course of the ER stay.   LKW: 10:30 pm tpa given?: no, ich ICH Score: 1 MRS: 0 [place] 3   INTERVAL HISTORY Her oldest dtr [name], her sister and brother are all at the bedside.  Dr. Pearlean Brownie showed them scans and talked about planned surgery by Dr. Conchita Paris.  Vitals:   08/14/17 0835 08/14/17 0840 08/14/17 0845 08/14/17 0850  BP: 122/75 118/81 119/79 118/81  Pulse: 92 94 92 92  Resp:  (!) 22  15  Temp: (!) 96.4 F (35.8 C) (!) 96.4 F (35.8 C) (!) 96.6 F (35.9 C) (!) 96.6 F (35.9 C)  TempSrc:      SpO2: 100% 95% 98% 95%  Weight:        CBC:  Recent Labs  Lab 08/14/17 0648  WBC 12.5*  NEUTROABS 5.5  HGB 11.8*  12.6  HCT 35.8*  37.0  MCV 88.6  PLT 281    Basic Metabolic Panel:  Recent Labs  Lab 08/14/17 0648  NA 139  143  K 2.9*  2.9*  CL 105  106  CO2 22  GLUCOSE 188*  189*  BUN 7  7  CREATININE 0.86  0.60  CALCIUM 9.5   Lipid Panel: No results found for: CHOL, TRIG, HDL, CHOLHDL, VLDL, LDLCALC HgbA1c: No results found for: HGBA1C Urine Drug Screen:     Component Value Date/Time   LABOPIA NONE DETECTED 08/14/2017 0644   COCAINSCRNUR NONE DETECTED 08/14/2017 0644   LABBENZ NONE DETECTED 08/14/2017 0644   AMPHETMU NONE DETECTED 08/14/2017 0644   THCU NONE DETECTED 08/14/2017 0644   LABBARB NONE DETECTED 08/14/2017 0644    Alcohol Level     Component Value Date/Time   ETH <10 08/14/2017 1191    IMAGING Ct Angio Head W Or Wo Contrast  Result Date: 08/14/2017 CLINICAL DATA:  Intracranial hemorrhage EXAM: CT ANGIOGRAPHY HEAD AND  NECK TECHNIQUE: Multidetector CT imaging of the head and neck was performed using the standard protocol during bolus administration of intravenous contrast. Multiplanar CT image reconstructions and MIPs were obtained to evaluate the vascular anatomy. Carotid stenosis measurements (when applicable) are obtained utilizing NASCET criteria, using the distal internal carotid diameter as the denominator. CONTRAST:  50 mL ISOVUE-370 IOPAMIDOL (ISOVUE-370) INJECTION 76% COMPARISON:  CT head 08/14/2017 FINDINGS: CTA NECK FINDINGS Aortic arch: Normal aortic arch and proximal great vessels. Right carotid system: Right carotid system widely patent. Bifurcation widely patent. Left carotid system: Left carotid system widely patent. Left carotid bifurcation patent. Vertebral arteries: Both vertebral arteries widely patent without stenosis or irregularity. Skeleton: Degenerative changes in the cervical spine with spondylosis and anterolisthesis C3-4. No acute skeletal abnormality. Dental caries. Other neck: Negative for mass or adenopathy in the neck. Upper chest: Visualized lung apices clear. Review of the MIP images confirms the above findings CTA HEAD FINDINGS Anterior circulation: Right cavernous carotid widely patent. Right anterior and middle cerebral arteries widely patent Left cavernous carotid widely patent. Left posterior communicating artery aneurysm measures approximately 4 x 6.5 mm and contains an irregular [employer]  at the apex. This apex extends into the left temporal hematoma. Hematoma felt to be due to rupture of this aneurysm. Left anterior and middle cerebral arteries widely patent without stenosis or vasospasm. Posterior circulation: Both vertebral arteries patent to the basilar. Basilar widely patent. Right PICA patent. Left PICA not visualized. Dominant left AICA. Superior cerebellar and posterior cerebral arteries patent bilaterally without stenosis. No aneurysm in the posterior circulation. Venous sinuses: Normal  venous enhancement Anatomic variants: None Delayed phase: Left temporal hematoma unchanged. Subdural hematoma left frontal lobe and along the tentorium on the left also unchanged. Intraventricular hemorrhage unchanged. Mild hydrocephalus. 11 mm midline shift to the right unchanged. No new hemorrhage since the CT earlier today. 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 Extensive intracranial hemorrhage is stable from earlier this morning. No other aneurysm. No significant atherosclerotic disease. These results were called by telephone at the time of interpretation on 08/14/2017 at 10:37 am to Dr. Conchita Paris , who verbally acknowledged these results. Electronically Signed   By: Marlan Palau M.D.   On: 08/14/2017 10:37   Ct Angio Neck W Or Wo Contrast  Result Date: 08/14/2017 CLINICAL DATA:  Intracranial hemorrhage EXAM: CT ANGIOGRAPHY HEAD AND NECK TECHNIQUE: Multidetector CT imaging of the head and neck was performed using the standard protocol during bolus administration of intravenous contrast. Multiplanar CT image reconstructions and MIPs were obtained to evaluate the vascular anatomy. Carotid stenosis measurements (when applicable) are obtained utilizing NASCET criteria, using the distal internal carotid diameter as the denominator. CONTRAST:  50 mL ISOVUE-370 IOPAMIDOL (ISOVUE-370) INJECTION 76% COMPARISON:  CT head 08/14/2017 FINDINGS: CTA NECK FINDINGS Aortic arch: Normal aortic arch and proximal great vessels. Right carotid system: Right carotid system widely patent. Bifurcation widely patent. Left carotid system: Left carotid system widely patent. Left carotid bifurcation patent. Vertebral arteries: Both vertebral arteries widely patent without stenosis or irregularity. Skeleton: Degenerative changes in the cervical spine with  spondylosis and anterolisthesis C3-4. No acute skeletal abnormality. Dental caries. Other neck: Negative for mass or adenopathy in the neck. Upper chest: Visualized lung apices clear. Review of the MIP images confirms the above findings CTA HEAD FINDINGS Anterior circulation: Right cavernous carotid widely patent. Right anterior and middle cerebral arteries widely patent Left cavernous carotid widely patent. 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 This apex extends into the left temporal hematoma. Hematoma felt to be due to rupture of this aneurysm. Left anterior and middle cerebral arteries widely patent without stenosis or vasospasm. Posterior circulation: Both vertebral arteries patent to the basilar. Basilar widely patent. Right PICA patent. Left PICA not visualized. Dominant left AICA. Superior cerebellar and posterior cerebral arteries patent bilaterally without stenosis. No aneurysm in the posterior circulation. Venous sinuses: Normal venous enhancement Anatomic variants: None Delayed phase: Left temporal hematoma unchanged. Subdural hematoma left frontal lobe and along the tentorium on the left also unchanged. Intraventricular hemorrhage unchanged. Mild hydrocephalus. 11 mm midline shift to the right unchanged. No new hemorrhage since the CT earlier today. 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 Extensive intracranial hemorrhage is stable from earlier this morning. No other aneurysm. No significant atherosclerotic disease. These results were  called by telephone at the time of interpretation on 08/14/2017 at 10:37 am to Dr. Conchita Paris , who verbally acknowledged these results. Electronically Signed   By: Marlan Palau M.D.   On: 08/14/2017 10:37   Ct  Head Code Stroke Wo Contrast  Result Date: 08/14/2017 CLINICAL DATA:  Code stroke.  Flaccid right side. EXAM: CT HEAD WITHOUT CONTRAST TECHNIQUE: Contiguous axial images were obtained from the base of the skull through the vertex without intravenous contrast. COMPARISON:  None. FINDINGS: Brain: 4.2 x 3.4 x 2.1 cm (estimated volume 12 cc) intraparenchymal hematoma in the medial left temporal lobe with intraventricular penetration in filling of the left lateral ventricle with blood. Smaller amount of blood in the right lateral ventricle. Third ventricle and fourth ventricle are filled. Mild edema surrounding the left temporal lobe hematoma suggesting that this is not hyperacute. Mass effect with right-to-left shift of 11 mm. Otherwise, the brain shows water probably chronic small-vessel ischemic changes of the deep white matter. There is subdural blood along the convexity on the left with maximal thickness of 6 mm. Small amount of blood also along the left tentorium. Vascular: Normal Skull: Normal Sinuses/Orbits: Clear/normal Other: None ASPECTS ([place]berta Stroke Program Early CT Score) - Ganglionic level infarction (caudate, lentiform nuclei, internal capsule, insula, M1-M3 cortex): 7 - Supraganglionic infarction (M4-M6 cortex): 3 Total score (0-10 with 10 being normal): 10 IMPRESSION: 1. Recent intraparenchymal hemorrhage affecting the mesial temporal lobe on the left with a hematoma 12 cc volume. Intraventricular penetration. Small amount of surrounding edema suggesting that this may be several hours to a day old. Mass effect with left-to-right shift of 11 mm. 6 mm subdural hematoma along the lateral convexity on the left with a small amount along the left tentorium. 2. ASPECTS is 10. 3. Dr. Amada Jupiter aware of the results, with neuro surgery contact. Electronically Signed   By: Paulina Fusi M.D.   On: 08/14/2017 07:09    PHYSICAL EXAM Constitutional: Appears well-developed and well-nourished.  Psych: Affect  appropriate to situation Eyes: No scleral injection HENT: No OP obstrucion Head: Normocephalic.  Cardiovascular: Normal rate and regular rhythm.  Respiratory: Effort normal and breath sounds normal to anterior ascultation Skin: WDI  Neuro: Mental Status: Patient is drowsy but awake, she follows simple commands .nonfluent hesitant speech with some word finding difficulties. Good comprehension. Diminished naming". Cranial Nerves: II: blinks to threat from the left but not right. Pupils are equal, round, and reactive to light.   III,IV, VI: left gaze preference but does not cross midline to R V: Facial sensation is symmetric to temperature VII: Facial movement with left facial droop.  VIII: hearing is intact to voice X: Uvula elevates symmetrically XI: Shoulder shrug is symmetric. XII: tongue is midline without atrophy or fasciculations.  Motor: She lifts both right arm and leg against gravity with 4/5 right arm, 3/5 right leg. Moves left side with good strength.  Sensory: Sensation is diminished on the right Cerebellar: Does not perform   ASSESSMENT/PLAN Ms. DAYNA ALIA is a 62 y.o. female with no significant history found having fallen out of the bed with R arm weakness and altered mental status.   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 Surrounding edema with L to R shift 11mm. 6mm SDH along L convexity. ASPECTS 10.  CTA head & neck LPCom aneurysm c/w rupture into L temporal lobe. Extensive hmg stable from earlier.  HIV pending   SCDs for VTE prophylaxis  No antithrombotic prior to admission (Goody Powders prn)  Therapy recommendations:  pending   Disposition:  pending   Leukocytosis  WBC 12.5.   Afebrile  UA no infection, CXR not performed  Elevated glucose  223  No hx  diabetes. Had decadron this am  Agree w/ CBGs  Check HgbA1c in am  DB RN following  Blood pressure  No hx hypertension  166/100 on arrival  SBP goal < 140  On cardene drip  Other Stroke Risk Factors  No family hx aneurysms  Other Active Problems  Hypokalemia 2.9 replacing  Hospital day # 0  Annie Main, MSN, APRN, ANVP-BC, AGPCNP-BC Advanced Practice Stroke Nurse [facility] See Amion for Schedule & Pager information 08/14/2017 2:55 PM  I have personally examined this patient, reviewed notes, independently viewed imaging studies, participated in medical decision making and plan of care.ROS completed by me personally and pertinent positives fully documented  I have made any additions or clarifications directly to the above note. Agree with note above. She presented with sudden onset of right hemiplegia due to left medial temporal hematoma with extension into the ventricular system and the subarachnoid space likely from ruptured posterior communicating artery aneurysm. She remains at significant risk for neurological worsening, aneurysm rerupture, hydrocephalus and brain herniation. She needs emergent aneurysm clipping and likely ventriculostomy to provide CSF drainage.Long discussion with the patient, her daughter and other family members as well as Dr. Conchita Paris, about treatment plan and answered questions. This patient is critically ill and at significant risk of neurological worsening, death and care requires constant monitoring of vital signs, hemodynamics,respiratory and cardiac monitoring, extensive review of multiple databases, frequent neurological assessment, discussion with family, other specialists and medical decision making of high complexity.I have made any additions or clarifications directly to the above note.This critical care time does not reflect procedure time, or teaching time or supervisory time of PA/NP/Med Resident etc but could involve care  discussion time.  I spent 50 minutes of neurocritical care time  in the care of  this patient.     Delia Heady, MD Medical Director [facility] Pager: [phone] 08/14/2017 3:12 PM  To contact Stroke Continuity provider, please refer to [email or web address]. After hours, contact General Neurology

## 2017-08-14 NOTE — Progress Notes (Signed)
   08/14/17 1100  Clinical Encounter Type  Visited With Patient and family together;Health care provider  Visit Type Follow-up;Spiritual support;Critical Care  Referral From Chaplain  Consult/Referral To Chaplain  Spiritual Encounters  Spiritual Needs Emotional   Followed up on a visit from [place].  Patient had been moved to 4N and was getting ready to go to surgery.  Met daughter outside the room and provided support for her.  We went in and prayed with the family prior to begin taken for surgery.  Will follow and support as needed. Chaplain Katherene Ponto

## 2017-08-14 NOTE — Consult Note (Addendum)
PULMONARY / CRITICAL CARE MEDICINE   Name: Carla Bauer MRN: [id number] DOB: April 13, 1955    ADMISSION DATE:  08/14/2017 CONSULTATION DATE:  5/04/06/17  REFERRING MD:  Dr. Pearlean Brownie   CHIEF COMPLAINT:  Right sided weakness, fall  HISTORY OF PRESENT ILLNESS:   62 y/o F admitted to [facility] on 5/15 with reports of right sided weakness and fall.    Family reported on admit that she was last seen normal on 5/14 at 1030 PM.  Family woke to hearing her fall.  They found her on the floor with right sided weakness.  EMS activated CODE STROKE.  The patient was evaluated in the ER per Neurology on arrival and found to be flaccid on the right with altered mental status.  In addition, she had hypertensive emergency.  CT imaging notable for large left temporal clot with minimal effacement of the basal cistern, significant IVH with casting of left lateral ventricle, 3rd/4th ventricle, thin left convexity SDH with 1.1cm L to R midline shift significantly out of proportion to the small SDH.  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  She was admitted to ICU under Neurology care.  On 5/15, decision was made by NSGY to proceed with surgical clot evacuation, clipping of aneurysm & EVD placement.    PCCM consulted for evaluation / post-operative ICU care.   PAST MEDICAL HISTORY :  She  has no past medical history on file.  PAST SURGICAL HISTORY: She  has a past surgical history that includes Cesarean section.  No Known Allergies  No current facility-administered medications on file prior to encounter.    Current Outpatient Medications on File Prior to Encounter  Medication Sig  . acetaminophen (TYLENOL) 500 MG tablet Take 1 tablet (500 mg total) by mouth every 6 (six) hours as needed. (Patient taking differently: Take 500 mg by mouth every 6 (six) hours as needed for mild pain. )  . Aspirin-Salicylamide-Caffeine (BC HEADACHE POWDER PO) Take 1 packet by mouth daily as needed (headache).    FAMILY HISTORY:   Her has no family status information on file.    SOCIAL HISTORY: She  reports that she has never smoked. She does not have any smokeless tobacco history on file. She reports that she does not drink alcohol or use drugs.  REVIEW OF SYSTEMS:  Unable to complete with patient due to AMS.   SUBJECTIVE:  Back in ICU post neuro OR   VITAL SIGNS: BP 116/80 Comment: Cardene stopped  Pulse 89   Temp 97.9 F (36.6 C) (Oral)   Resp (!) 23   Ht  (1.549 m)   Wt 69.8 kg (153 lb 14.1 oz)   SpO2 100%   BMI 29.08 kg/m   HEMODYNAMICS:    VENTILATOR SETTINGS: Vent Mode: PSV PEEP:  [5 cmH20] 5 cmH20 Pressure Support:  [10 cmH20] 10 cmH20  INTAKE / OUTPUT: No intake/output data recorded.  PHYSICAL EXAMINATION: General:  wdwn female, NAD  Neuro:  Sedated post op, spontaneously breathing, RASS -2  HEENT:  Mm moist, ETT, EVD  Cardiovascular:  s1s2 rrr Lungs:  resps even non labored on PS 10/5, Vt 650, clear  Abdomen:  Soft, +bs  Musculoskeletal:  Warm and dry, no sig edema   LABS:  BMET Recent Labs  Lab 08/14/17 0648  NA 139  143  K 2.9*  2.9*  CL 105  106  CO2 22  BUN 7  7  CREATININE 0.86  0.60  GLUCOSE 188*  189*  Electrolytes Recent Labs  Lab 08/14/17 0648  CALCIUM 9.5    CBC Recent Labs  Lab 08/14/17 0648  WBC 12.5*  HGB 11.8*  12.6  HCT 35.8*  37.0  PLT 281    Coag's Recent Labs  Lab 08/14/17 0648  APTT 25  INR 1.01    Sepsis Markers No results for input(s): LATICACIDVEN, PROCALCITON, O2SATVEN in the last 168 hours.  ABG No results for input(s): PHART, PCO2ART, PO2ART in the last 168 hours.  Liver Enzymes Recent Labs  Lab 08/14/17 0648  AST 22  ALT 16  ALKPHOS 66  BILITOT 0.6  ALBUMIN 3.9    Cardiac Enzymes No results for input(s): TROPONINI, PROBNP in the last 168 hours.  Glucose Recent Labs  Lab 08/14/17 0701  GLUCAP 223*    Imaging Ct Angio Head W Or Wo Contrast  Result Date: 08/14/2017 CLINICAL DATA:   Intracranial hemorrhage EXAM: CT ANGIOGRAPHY HEAD AND NECK TECHNIQUE: Multidetector CT imaging of the head and neck was performed using the standard protocol during bolus administration of intravenous contrast. Multiplanar CT image reconstructions and MIPs were obtained to evaluate the vascular anatomy. Carotid stenosis measurements (when applicable) are obtained utilizing NASCET criteria, using the distal internal carotid diameter as the denominator. CONTRAST:  50 mL ISOVUE-370 IOPAMIDOL (ISOVUE-370) INJECTION 76% COMPARISON:  CT head 08/14/2017 FINDINGS: CTA NECK FINDINGS Aortic arch: Normal aortic arch and proximal great vessels. Right carotid system: Right carotid system widely patent. Bifurcation widely patent. Left carotid system: Left carotid system widely patent. Left carotid bifurcation patent. Vertebral arteries: Both vertebral arteries widely patent without stenosis or irregularity. Skeleton: Degenerative changes in the cervical spine with spondylosis and anterolisthesis C3-4. No acute skeletal abnormality. Dental caries. Other neck: Negative for mass or adenopathy in the neck. Upper chest: Visualized lung apices clear. Review of the MIP images confirms the above findings CTA HEAD FINDINGS Anterior circulation: Right cavernous carotid widely patent. Right anterior and middle cerebral arteries widely patent Left cavernous carotid widely patent. 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 This apex extends into the left temporal hematoma. Hematoma felt to be due to rupture of this aneurysm. Left anterior and middle cerebral arteries widely patent without stenosis or vasospasm. Posterior circulation: Both vertebral arteries patent to the basilar. Basilar widely patent. Right PICA patent. Left PICA not visualized. Dominant left AICA. Superior cerebellar and posterior cerebral arteries patent bilaterally without stenosis. No aneurysm  in the posterior circulation. Venous sinuses: Normal venous enhancement Anatomic variants: None Delayed phase: Left temporal hematoma unchanged. Subdural hematoma left frontal lobe and along the tentorium on the left also unchanged. Intraventricular hemorrhage unchanged. Mild hydrocephalus. 11 mm midline shift to the right unchanged. No new hemorrhage since the CT earlier today. 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 Extensive intracranial hemorrhage is stable from earlier this morning. No other aneurysm. No significant atherosclerotic disease. These results were called by telephone at the time of interpretation on 08/14/2017 at 10:37 am to Dr. Conchita Paris , who verbally acknowledged these results. Electronically Signed   By: Marlan Palau M.D.   On: 08/14/2017 10:37   Ct Angio Neck W Or Wo Contrast  Result Date: 08/14/2017 CLINICAL DATA:  Intracranial hemorrhage EXAM: CT ANGIOGRAPHY HEAD AND NECK TECHNIQUE: Multidetector CT imaging of the head and neck was performed  using the standard protocol during bolus administration of intravenous contrast. Multiplanar CT image reconstructions and MIPs were obtained to evaluate the vascular anatomy. Carotid stenosis measurements (when applicable) are obtained utilizing NASCET criteria, using the distal internal carotid diameter as the denominator. CONTRAST:  50 mL ISOVUE-370 IOPAMIDOL (ISOVUE-370) INJECTION 76% COMPARISON:  CT head 08/14/2017 FINDINGS: CTA NECK FINDINGS Aortic arch: Normal aortic arch and proximal great vessels. Right carotid system: Right carotid system widely patent. Bifurcation widely patent. Left carotid system: Left carotid system widely patent. Left carotid bifurcation patent. Vertebral arteries: Both vertebral arteries widely patent without stenosis or irregularity.  Skeleton: Degenerative changes in the cervical spine with spondylosis and anterolisthesis C3-4. No acute skeletal abnormality. Dental caries. Other neck: Negative for mass or adenopathy in the neck. Upper chest: Visualized lung apices clear. Review of the MIP images confirms the above findings CTA HEAD FINDINGS Anterior circulation: Right cavernous carotid widely patent. Right anterior and middle cerebral arteries widely patent Left cavernous carotid widely patent. 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 This apex extends into the left temporal hematoma. Hematoma felt to be due to rupture of this aneurysm. Left anterior and middle cerebral arteries widely patent without stenosis or vasospasm. Posterior circulation: Both vertebral arteries patent to the basilar. Basilar widely patent. Right PICA patent. Left PICA not visualized. Dominant left AICA. Superior cerebellar and posterior cerebral arteries patent bilaterally without stenosis. No aneurysm in the posterior circulation. Venous sinuses: Normal venous enhancement Anatomic variants: None Delayed phase: Left temporal hematoma unchanged. Subdural hematoma left frontal lobe and along the tentorium on the left also unchanged. Intraventricular hemorrhage unchanged. Mild hydrocephalus. 11 mm midline shift to the right unchanged. No new hemorrhage since the CT earlier today. 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 Extensive intracranial hemorrhage is stable from earlier this morning. No other aneurysm. No significant atherosclerotic disease. These results were called by telephone at the time of interpretation on 08/14/2017 at 10:37 am to Dr. Conchita Paris , who verbally acknowledged these results. Electronically  Signed   By: Marlan Palau M.D.   On: 08/14/2017 10:37   Ct Head Code Stroke Wo Contrast  Result Date: 08/14/2017 CLINICAL DATA:  Code stroke.  Flaccid right side. EXAM: CT HEAD WITHOUT CONTRAST TECHNIQUE: Contiguous axial images were obtained from the base of the skull through the vertex without intravenous contrast. COMPARISON:  None. FINDINGS: Brain: 4.2 x 3.4 x 2.1 cm (estimated volume 12 cc) intraparenchymal hematoma in the medial left temporal lobe with intraventricular penetration in filling of the left lateral ventricle with blood. Smaller amount of blood in the right lateral ventricle. Third ventricle and fourth ventricle are filled. Mild edema surrounding the left temporal lobe hematoma suggesting that this is not hyperacute. Mass effect with right-to-left shift of 11 mm. Otherwise, the brain shows water probably chronic small-vessel ischemic changes of the deep white matter. There is subdural blood along the convexity on the left with maximal thickness of 6 mm. Small amount of blood also along the left tentorium. Vascular: Normal Skull: Normal Sinuses/Orbits: Clear/normal Other: None ASPECTS (Alberta Stroke Program Early CT Score) - Ganglionic level infarction (caudate, lentiform nuclei, internal capsule, insula, M1-M3 cortex): 7 - Supraganglionic infarction (M4-M6 cortex): 3 Total score (0-10 with 10 being normal): 10 IMPRESSION: 1. Recent intraparenchymal hemorrhage affecting the  mesial temporal lobe on the left with a hematoma 12 cc volume. Intraventricular penetration. Small amount of surrounding edema suggesting that this may be several hours to a day old. Mass effect with left-to-right shift of 11 mm. 6 mm subdural hematoma along the lateral convexity on the left with a small amount along the left tentorium. 2. ASPECTS is 10. 3. Dr. Amada Jupiter aware of the results, with neuro surgery contact. Electronically Signed   By: Paulina Fusi M.D.   On: 08/14/2017 07:09     STUDIES:  UDS 5/15 >>  negative  ETOH 5/15 >> negative  CTA Head / Neck 5/15 >> left posterior artery aneurysm 4x6.71mm with irregularity consistent with rupture and ICH into the left temporal lobe.  Extensive intracranial hemorrhage stable.  No other aneurysm.   CULTURES:   ANTIBIOTICS:   SIGNIFICANT EVENTS: 5/15  Admit with fall, right sided weakness, AMS.  To OR for clot evacuation   LINES/TUBES: ETT 5/15>>> R radial aline 5/15>>>  DISCUSSION: 62 y/o F admitted 5/15 with ruptured PCOM aneurysm.  To OR for evacuation of clot / EVD placement.  Returned to ICU on vent post-op.   ASSESSMENT / PLAN:  NEUROLOGIC A:   PCOM Aneurysm Rupture - with large left mesial temporal clot, significant IVH, thin SDH with left to right midline shift  P:   RASS goal: -1 overnight  WUA in am  Per neurosurgery  Propofol if needed for sedation overnight   PULMONARY A: Acute Respiratory Insufficiency - in setting of aneurysm rupture  P:   Vent support - 8cc/kg  F/u CXR  F/u ABG Ok to rest on PS 10/5 overnight  Can likely extubate in am   CARDIOVASCULAR A:  Hypertensive Emergency  P:  cardene gtt for BP control goal SBP <140  RENAL A:   Hypokalemia  P:   Replete K PRN  F/u chem   GASTROINTESTINAL A:   At Risk Aspiration  P:   NPO PPI   HEMATOLOGIC A:   Anemia - mild P:  Trend CBC SCD's for DVT prophylaxis   INFECTIOUS Indwelling EVD  P:   Monitor fever curve / WBC trend  Post-operative abx per NSGY   ENDOCRINE A:   Hyperglycemia  P:   Trend glucose on BMP.  If consistently greater than 180, add SSI   FAMILY  - Updates:  No family at bedside 5/15 - updated post op by Dr. Rica Records   - Inter-disciplinary family meet or Palliative Care meeting due by:  Day 7    Dirk Dress, NP 08/14/2017  4:22 PM Pager: [phone] or [phone]

## 2017-08-14 NOTE — Progress Notes (Signed)
Patient ID: Carla Bauer, female   DOB: 1955-07-15, 62 y.o.   MRN: [id number] Ct reviewed, no pressure on brainstem, basal cisterns no rffaced. Blood in ventricles, pneumocephalus. Scan is improved from preop. Moving left side purposefully.dressing is dry,. Other than initial insult, no clearcut reason for lethargy, will monitor

## 2017-08-14 NOTE — ED Provider Notes (Signed)
TIME SEEN: 6:58 AM  CHIEF COMPLAINT: Code stroke  HPI: Patient is a 62 year old female with unknown past medical history who presents emergency department as a code stroke.  Last seen normal at 10:30 PM.  Daughter heard a "crash" this morning and found her on the floor.  Patient hypertensive with EMS with blood pressures of 200s/100s with normal blood glucose.  Unclear if patient is on anticoagulation.  ROS: Level 5 caveat for altered mental status  PAST MEDICAL HISTORY/PAST SURGICAL HISTORY:  No past medical history on file.  MEDICATIONS:  Prior to Admission medications   Medication Sig Start Date End Date Taking? Authorizing Provider  acetaminophen (TYLENOL) 500 MG tablet Take 1 tablet (500 mg total) by mouth every 6 (six) hours as needed. 10/09/13   Junious Silk, PA-C  Aspirin-Salicylamide-Caffeine (BC HEADACHE POWDER PO) Take 1 packet by mouth daily as needed (headache).    [provider]    ALLERGIES:  No Known Allergies  SOCIAL HISTORY:  Social History   Tobacco Use  . Smoking status: Never Smoker  Substance Use Topics  . Alcohol use: No    FAMILY HISTORY: No family history on file.  EXAM: BP (!) 158/97   Pulse 85   Temp (!) 97 F (36.1 C) (Temporal)   Resp 16   Wt 69.4 kg (153 lb)   SpO2 95%   BMI 28.91 kg/m  CONSTITUTIONAL: Alert but does not answer questions or follow commands HEAD: Normocephalic EYES: Conjunctivae clear, pupils appear equal; L gaze preference ENT: normal nose; moist mucous membranes NECK: Supple, no meningismus, no nuchal rigidity, no LAD  CARD: RRR; S1 and S2 appreciated; no murmurs, no clicks, no rubs, no gallops RESP: Normal chest excursion without splinting or tachypnea; breath sounds clear and equal bilaterally; no wheezes, no rhonchi, no rales, no hypoxia or respiratory distress, speaking full sentences ABD/GI: Normal bowel sounds; non-distended; soft, non-tender, no rebound, no guarding, no peritoneal signs, no  hepatosplenomegaly BACK:  The back appears normal and is non-tender to palpation, there is no CVA tenderness EXT: Normal ROM in all joints; non-tender to palpation; no edema; normal capillary refill; no cyanosis, no calf tenderness or swelling    SKIN: Normal color for age and race; warm; no rash NEURO: Patient has purposeful movement of her left upper extremity but complete flaccid paralysis of her right upper and lower extremity.  No movement of the left lower extremity.  Aphasic.  Left gaze preference.    MEDICAL DECISION MAKING: Patient here as a code stroke.  CT imaging shows large intraparenchymal hemorrhage near the brainstem with intraventricular spread with midline shift.  She is hypertensive.  She will receive IV labetalol now and then be started on Cardene drip.  Dr. Amada Jupiter with neurology at bedside upon patient's arrival.  Appreciate his help.  Will discuss with neurosurgery.  ED PROGRESS:   6:57 AM  D/w Sabino Dick, PA on-call for neurosurgery.  He is on his way emergently to the ED and will see patient upon arrival.    7:20 AM  Dr. Amada Jupiter will update patient's family who is now in the ED.  Patient will likely need intubation.    7:40 AM  Neuro reports patient's exam is actually improving and she is now moving her right upper extremity.  We will hold off on intubation at this time.  Neurosurgery has seen the patient.  Neurology will admit.    I reviewed all nursing notes, vitals, pertinent previous records, EKGs, lab and urine results, imaging (  as available).    EKG Interpretation  Date/Time:  Wednesday Aug 14 2017 06:55:38 EDT Ventricular Rate:  94 PR Interval:    QRS Duration: 92 QT Interval:  397 QTC Calculation: 497 R Axis:   47 Text Interpretation:  Sinus rhythm Borderline prolonged PR interval Probable left atrial enlargement Borderline prolonged QT interval No significant change since last tracing Confirmed by Rochele Raring [phone]) on 08/14/2017  6:59:38 AM         CRITICAL CARE Performed by: Baxter Hire Kelbie Moro   Total critical care time: 45 minutes  Critical care time was exclusive of separately billable procedures and treating other patients.  Critical care was necessary to treat or prevent imminent or life-threatening deterioration.  Critical care was time spent personally by me on the following activities: development of treatment plan with patient and/or surrogate as well as nursing, discussions with consultants, evaluation of patient's response to treatment, examination of patient, obtaining history from patient or surrogate, ordering and performing treatments and interventions, ordering and review of laboratory studies, ordering and review of radiographic studies, pulse oximetry and re-evaluation of patient's condition.    Honora Searson, Layla Maw, DO 08/14/17 [phone]

## 2017-08-14 NOTE — Anesthesia Preprocedure Evaluation (Addendum)
Anesthesia Evaluation  Patient identified by MRN, date of birth, ID band Patient awake    Reviewed: Allergy & Precautions, NPO status , [name] Chart, lab work & pertinent test results  Airway Mallampati: II  TM Distance: >3 FB Neck ROM: Full    Dental  (+) Teeth Intact   Pulmonary neg pulmonary ROS,    breath sounds clear to auscultation       Cardiovascular negative cardio ROS   Rhythm:Regular Rate:Normal     Neuro/Psych negative neurological ROS     GI/Hepatic negative GI ROS, Neg liver ROS,   Endo/Other  negative endocrine ROS  Renal/GU negative Renal ROS     Musculoskeletal negative musculoskeletal ROS (+)   Abdominal Normal abdominal exam  (+)   Peds  Hematology negative hematology ROS (+)   Anesthesia Other Findings   Reproductive/Obstetrics                            Lab Results  Component Value Date   WBC 12.5 (H) 08/14/2017   HGB 12.6 08/14/2017   HGB 11.8 (L) 08/14/2017   HCT 37.0 08/14/2017   HCT 35.8 (L) 08/14/2017   MCV 88.6 08/14/2017   PLT 281 08/14/2017   Lab Results  Component Value Date   CREATININE 0.60 08/14/2017   CREATININE 0.86 08/14/2017   BUN 7 08/14/2017   BUN 7 08/14/2017   NA 143 08/14/2017   NA 139 08/14/2017   K 2.9 (L) 08/14/2017   K 2.9 (L) 08/14/2017   CL 106 08/14/2017   CL 105 08/14/2017   CO2 22 08/14/2017   Lab Results  Component Value Date   INR 1.01 08/14/2017   EKG: normal sinus rhythm.  Anesthesia Physical Anesthesia Plan  ASA: III and emergent  Anesthesia Plan: General   Post-op Pain Management:    Induction: Intravenous  PONV Risk Score and Plan: 4 or greater and Ondansetron, Dexamethasone and Treatment may vary due to age or medical condition  Airway Management Planned: Oral ETT  Additional Equipment: Arterial line  Intra-op Plan:   Post-operative Plan: Possible Post-op intubation/ventilation  Informed  Consent: I have reviewed the patients History and Physical, chart, labs and discussed the procedure including the risks, benefits and alternatives for the proposed anesthesia with the patient or authorized representative who has indicated his/her understanding and acceptance.   Dental advisory given  Plan Discussed with: CRNA  Anesthesia Plan Comments:        Anesthesia Quick Evaluation

## 2017-08-14 NOTE — Op Note (Signed)
PREOP DIAGNOSIS:  1. Left Posterior communicating artery aneurysm 2. Left temporal hematoma    POSTOP DIAGNOSIS: Same  PROCEDURE: 1. Left pterional craniotomy for clipping of left posterior to medicating artery aneurysm 2. Evacuation of left temporal hematoma 3. Placement of right frontal external ventricular drain  SURGEON: Dr. Lisbeth Renshaw, MD  ASSISTANT: Dr. Coletta Memos, MD  ANESTHESIA: General Endotracheal  EBL: 100 cc  SPECIMENS: None  DRAINS: Right frontal external ventricular drain  COMPLICATIONS: None immediate  CONDITION: Hemodynamically stable to postanesthesia care unit  HISTORY: Carla Bauer is a 62 y.o. female initially presented to the emergency department after passing out this morning.  Her work-up included CT scan of the head which demonstrated left temporal hematoma with some mass-effect upon the brainstem.  She also had significant amount of intraventricular hemorrhage.  In the absence of a history of hypertension, CT angiogram was obtained which did demonstrate the presence of a left posterior communicating artery aneurysm as a source of hemorrhage.  She therefore presents for surgical clipping with evacuation hematoma and placement of a ventricular drain.  The risks and benefits of the surgery were explained in detail to the patient's daughters and son.  After all questions were answered informed consent was obtained and witnessed.  PROCEDURE IN DETAIL: The patient was brought to the operating room. After induction of general anesthesia, the patient was positioned on the operative table in the [place] head holder in the supine position with the head turned slightly towards the right. All pressure points were meticulously padded.  Standard curvilinear skin incision was then marked out and prepped and draped in the usual sterile fashion.  In addition, the midline was marked out, and a small incision was marked out over Kocher's point for placement of  the right frontal external ventricular drain.  After timeout was conducted, the right frontal and left curvilinear incision were infiltrated with local anesthetic without epinephrine.  The right frontal incision was then made and carried through the galea.  Self-retaining retractor was placed.  The high-speed drill was used to create a small bur hole.  The dura was then coagulated.  Using standard anatomic landmarks, a right frontal ventriculostomy catheter was passed in a single attempt with good flow of clear CSF obtained.  The catheter was then tunneled subcutaneously.  The small frontal incision was closed with 3-0 Vicryl stitches and skin staples.  The catheter was then capped and secured in place.  At this point, the left-sided incision was then opened sharply and carried down through the galea.  Hemostasis on the skin edges was achieved with Raney clips.  The superficial and deep temporal fat pads were then identified and incised.  Interfascial dissection was then carried out over the deep temporal fascia.  The skin flap was then reflected anteriorly.  The lateral orbit was identified.  At this point the temporalis fascia and muscle was then cut and the temporalis muscle was reflected inferiorly.  High-speed drill was then used to create a standard frontotemporal bone flap.  During removal of the flap, the temporal portion of the bone did crack and it was removed in 2 pieces.  Hemostasis on the epidural surface was then achieved with a combination of bipolar electrocautery and morselized Gelfoam and thrombin.  Using the high-speed drill, the lesser wing of the sphenoid was then drilled down until the evening orbital ligament was identified.  The dura was then opened in a curvilinear fashion based along the orbit and tacked up with  4 Nurolon stitches.  The microscope was then draped sterilely and brought into the field, and the remainder of the case was done under the microscope using  microdissection.  Immediately underneath the dura we did encounter a subdural hematoma which was evacuated easily with suction.  This measured slightly less than 1 cm in thickness.  Initially, using a subfrontal approach, the left optic nerve was identified in the optical carotid cistern was opened with standard arachnoid knife.  There was good egress of CSF from the cistern.  Dissection was then carried laterally to identify the proximal supraclinoid internal carotid artery.  The carotid was then dissected more distally until the neck of the posterior to medicating artery was identified.  The medial portion of the sylvian fissure was then split in order to further expose the distal neck of the aneurysm.  At this point, a standard titanium curved mini aneurysm clip was identified and placed across the neck of the aneurysm.  I then used bipolar electrocautery to coagulate the portion of the temporal lobe adherent to the aneurysm dome.  The aneurysm dome did appear to be significantly less tense.  The clip blades did appear to completely cross the aneurysm neck likely indicating complete occlusion of the aneurysm.  Aneurysm occlusion was confirmed with intraoperative video ICG angiography.  Patency of the internal carotid artery was also confirmed.  At this point, I made a small corticectomy along the superior temporal gyrus anteriorly just proximal to the temporal pole.  The white matter was dissected with bipolar and suction, and I immediately identified the temporal polar clot.  This was easily removed with suction.  Once the entire hematoma was removed, the entire wound was irrigated with copious amounts of normal saline irrigation.  There was no active bleeding within the hematoma cavity or on the brain surface.  The dura was then reapproximated with 4 Nurolon stitches.  It was covered with a dural onlay dural matrix graft.  The bone flap was then replaced and secured with standard titanium plates and  screws.  The temporalis muscle was then reapproximated with 0 Vicryl stitches.  The galea was reapproximated with interrupted 3-0 Vicryl stitches, and the skin was closed with staples.  Sterile dressing and bacitracin ointment was then applied.  At the end of the case all sponge, needle, instrument, and cottonoid counts were correct.    Patient was removed from the [place] head holder, extubated, and taken to the postanesthesia care unit in stable hemodynamic condition.

## 2017-08-14 NOTE — Anesthesia Postprocedure Evaluation (Signed)
Anesthesia Post Note  Patient: Carla Bauer  Procedure(s) Performed: 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 (Left Head)     Patient location during evaluation: SICU Anesthesia Type: General Level of consciousness: sedated Pain management: pain level controlled Vital Signs Assessment: post-procedure vital signs reviewed and stable Respiratory status: patient remains intubated per anesthesia plan Cardiovascular status: stable Postop Assessment: no apparent nausea or vomiting Anesthetic complications: no    Last Vitals:  Vitals:   08/14/17 1015 08/14/17 1600  BP: 116/80   Pulse: 89   Resp: (!) 23   Temp:    SpO2: 99% 100%    Last Pain:  Vitals:   08/14/17 0930  TempSrc:   PainSc: 3                  Cadyn Fann,W. EDMOND

## 2017-08-14 NOTE — Progress Notes (Signed)
Inpatient Diabetes Program Recommendations  AACE/ADA: New Consensus Statement on Inpatient Glycemic Control (2015)  Target Ranges:  Prepandial:   less than 140 mg/dL      Peak postprandial:   less than 180 mg/dL (1-2 hours)      Critically ill patients:  140 - 180 mg/dL   Results for SHAQUIA, BERKLEY (MRN [id number]) as of 08/14/2017 12:41  Ref. Range 08/14/2017 07:01  Glucose-Capillary Latest Ref Range: 65 - 99 mg/dL 409 (H)   Review of Glycemic Control  Diabetes history: None  Inpatient Diabetes Program Recommendations:    Glucose 223 mg/dl this am at 8119. Patient has since had Decadron 10 mg during surgery. Please consider CBGs and Novolog Moderate Correction 0-15 units Q4 hours.   Thanks,  Christena Deem RN, MSN, BC-ADM, [facility] Inpatient Diabetes Coordinator Team Pager [phone] (8a-5p)

## 2017-08-14 NOTE — Transfer of Care (Signed)
Immediate Anesthesia Transfer of Care Note  Patient: Carla Bauer  Procedure(s) Performed: 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 (Left Head)  Patient Location: ICU  Anesthesia Type:General  Level of Consciousness: drowsy, responds to stimulation and Patient remains intubated per anesthesia plan  Airway & Oxygen Therapy: Patient remains intubated per anesthesia plan and Patient placed on Ventilator (see vital sign flow sheet for setting)  Post-op Assessment: Report given to RN and Post -op Vital signs reviewed and stable  Post vital signs: Reviewed and stable  Last Vitals:  Vitals Value Taken Time  BP 122/95 08/14/2017  4:00 PM  Temp 35.3 C 08/14/2017  3:52 PM  Pulse 83 08/14/2017  4:02 PM  Resp 13 08/14/2017  4:05 PM  SpO2 100 % 08/14/2017  4:02 PM  Vitals shown include unvalidated device data.  Last Pain:  Vitals:   08/14/17 0930  TempSrc:   PainSc: 3          Complications: No apparent anesthesia complications

## 2017-08-14 NOTE — Anesthesia Procedure Notes (Signed)
Procedure Name: Intubation Date/Time: 08/14/2017 11:38 AM Performed by: Scheryl Darter, CRNA Pre-anesthesia Checklist: Patient identified, Emergency Drugs available, Suction available and Patient being monitored Patient Re-evaluated:Patient Re-evaluated prior to induction Oxygen Delivery Method: Circle System Utilized Preoxygenation: Pre-oxygenation with 100% oxygen Induction Type: IV induction Ventilation: Mask ventilation without difficulty Laryngoscope Size: Mac and 3 Grade View: Grade I Tube type: Oral Tube size: 7.0 mm Number of attempts: 1 Airway Equipment and Method: Stylet and Oral airway Placement Confirmation: ETT inserted through vocal cords under direct vision,  positive ETCO2 and breath sounds checked- equal and bilateral Secured at: 22 cm Tube secured with: Tape Dental Injury: Teeth and Oropharynx as per pre-operative assessment

## 2017-08-14 NOTE — ED Triage Notes (Signed)
Pt BIB [facility] for eval of altered mental status. Pt was LKN @ 2230 last night before bed. Daughter heard a crash this AM and found her on the floor, minimally responsive. Arrives via EMS receving BVM assistance w/ ventilations. R sided neglect, flaccidity. Code Stroke called PTA

## 2017-08-14 NOTE — Progress Notes (Signed)
PT Cancellation Note  Patient Details Name: MAGARET JUSTO MRN: [id number] DOB: [birth year]   Cancelled Treatment:    Reason Eval/Treat Not Completed: Patient not medically ready.  Pt in OR for emergent crani.  Will see tomorrow if has OOB activity orders and otherwise as able. 08/14/2017  [place] Bing, PT [phone]  (pager)   Eliseo Gum Tanai Bouler 08/14/2017, 2:48 PM

## 2017-08-14 NOTE — Anesthesia Procedure Notes (Signed)
Arterial Line Insertion Start/End5/15/2019 10:50 AM, 08/14/2017 11:00 AM Performed by: Shelton Silvas, MD  Patient location: Pre-op. Preanesthetic checklist: patient identified, IV checked, site marked, risks and benefits discussed, surgical consent, monitors and equipment checked, pre-op evaluation, timeout performed and anesthesia consent Lidocaine 1% used for infiltration Right, radial was placed Catheter size: 20 G Hand hygiene performed  and maximum sterile barriers used   Attempts: 1 Procedure performed without using ultrasound guided technique. Following insertion, Biopatch and dressing applied. Post procedure assessment: normal  Patient tolerated the procedure well with no immediate complications. Additional procedure comments: Performed by Arlice Colt, SRNA.

## 2017-08-14 NOTE — ED Notes (Signed)
Dr. Petra Kuba talking with daughter. Patient will open her eyes when spoken to. Daughter at bedside and told her mother she loved her , patient repeated back I Love You Too.

## 2017-08-14 NOTE — ED Notes (Signed)
Family at bedside. Talking is speaking in short words to family

## 2017-08-15 ENCOUNTER — Inpatient Hospital Stay (HOSPITAL_COMMUNITY): Payer: Medicaid Other

## 2017-08-15 ENCOUNTER — Encounter (HOSPITAL_COMMUNITY): Payer: Self-pay | Admitting: Neurosurgery

## 2017-08-15 DIAGNOSIS — I610 Nontraumatic intracerebral hemorrhage in hemisphere, subcortical: Secondary | ICD-10-CM

## 2017-08-15 LAB — CBC WITH DIFFERENTIAL/PLATELET
Abs Immature Granulocytes: 0.1 10*3/uL (ref 0.0–0.1)
BASOS ABS: 0 10*3/uL (ref 0.0–0.1)
Basophils Relative: 0 %
EOS ABS: 0 10*3/uL (ref 0.0–0.7)
Eosinophils Relative: 0 %
HEMATOCRIT: 28.8 % — AB (ref 36.0–46.0)
HEMOGLOBIN: 9.5 g/dL — AB (ref 12.0–15.0)
Immature Granulocytes: 1 %
LYMPHS ABS: 1.4 10*3/uL (ref 0.7–4.0)
LYMPHS PCT: 10 %
MCH: 29.3 pg (ref 26.0–34.0)
MCHC: 33 g/dL (ref 30.0–36.0)
MCV: 88.9 fL (ref 78.0–100.0)
Monocytes Absolute: 0.9 10*3/uL (ref 0.1–1.0)
Monocytes Relative: 6 %
NEUTROS PCT: 83 %
Neutro Abs: 11.8 10*3/uL — ABNORMAL HIGH (ref 1.7–7.7)
Platelets: 230 10*3/uL (ref 150–400)
RBC: 3.24 MIL/uL — AB (ref 3.87–5.11)
RDW: 14 % (ref 11.5–15.5)
WBC: 14.2 10*3/uL — AB (ref 4.0–10.5)

## 2017-08-15 LAB — BASIC METABOLIC PANEL
ANION GAP: 8 (ref 5–15)
Anion gap: 9 (ref 5–15)
BUN: 6 mg/dL (ref 6–20)
BUN: 5 mg/dL — ABNORMAL LOW (ref 6–20)
CALCIUM: 8.2 mg/dL — AB (ref 8.9–10.3)
CHLORIDE: 111 mmol/L (ref 101–111)
CO2: 23 mmol/L (ref 22–32)
CO2: 22 mmol/L (ref 22–32)
CREATININE: 0.6 mg/dL (ref 0.44–1.00)
Calcium: 8.6 mg/dL — ABNORMAL LOW (ref 8.9–10.3)
Chloride: 114 mmol/L — ABNORMAL HIGH (ref 101–111)
Creatinine, Ser: 0.62 mg/dL (ref 0.44–1.00)
GFR calc non Af Amer: >60 mL/min (ref >60)
GFR calc non Af Amer: >60 mL/min (ref >60)
Glucose, Bld: 148 mg/dL — ABNORMAL HIGH (ref 65–99)
Glucose, Bld: 176 mg/dL — ABNORMAL HIGH (ref 65–99)
POTASSIUM: 3.3 mmol/L — AB (ref 3.5–5.1)
Potassium: 3.2 mmol/L — ABNORMAL LOW (ref 3.5–5.1)
SODIUM: 143 mmol/L (ref 135–145)
SODIUM: 144 mmol/L (ref 135–145)

## 2017-08-15 LAB — HIV ANTIBODY (ROUTINE TESTING W REFLEX): HIV Screen 4th Generation wRfx: NONREACTIVE

## 2017-08-15 LAB — PATHOLOGIST SMEAR REVIEW

## 2017-08-15 LAB — GLUCOSE, CAPILLARY
GLUCOSE-CAPILLARY: 147 mg/dL — AB (ref 65–99)
GLUCOSE-CAPILLARY: 152 mg/dL — AB (ref 65–99)
GLUCOSE-CAPILLARY: 153 mg/dL — AB (ref 65–99)
GLUCOSE-CAPILLARY: 154 mg/dL — AB (ref 65–99)

## 2017-08-15 LAB — HEMOGLOBIN A1C
Hgb A1c MFr Bld: 6 % — ABNORMAL HIGH (ref 4.8–5.6)
Mean Plasma Glucose: 125.5 mg/dL

## 2017-08-15 MED ORDER — ORAL CARE MOUTH RINSE
15.0000 mL | OROMUCOSAL | Status: DC
  Administered 2017-08-15 – 2017-08-16 (×11): 15 mL via OROMUCOSAL

## 2017-08-15 MED ORDER — NIMODIPINE 60 MG/20ML PO SOLN
60.0000 mg | ORAL | Status: DC
  Administered 2017-08-15 – 2017-09-03 (×113): 60 mg
  Filled 2017-08-15 (×118): qty 20

## 2017-08-15 MED FILL — Thrombin For Soln 5000 Unit: CUTANEOUS | Qty: 5000 | Status: AC

## 2017-08-15 MED FILL — Thrombin For Soln 20000 Unit: CUTANEOUS | Qty: 1 | Status: AC

## 2017-08-15 NOTE — Progress Notes (Signed)
PT Cancellation Note  Patient Details Name: Carla Bauer MRN: [id number] DOB: [birth year]   Cancelled Treatment:    Reason Eval/Treat Not Completed: Patient not medically ready.  Pt still on bedrest, on vent and not arousing well post surgery. 08/15/2017  [place] Bing, PT [phone]  (pager)   [name] 08/15/2017, 12:23 PM

## 2017-08-15 NOTE — Progress Notes (Signed)
PULMONARY / CRITICAL CARE MEDICINE   Name: Carla Bauer MRN: [id number] DOB: [birth year]    ADMISSION DATE:  08/14/2017 CONSULTATION DATE:  08/14/2017  REFERRING MD:  Patric Dykes - CNSA.   CHIEF COMPLAINT:  Aneurysmal subarachnoid hemorrhage.   HISTORY OF PRESENT ILLNESS:   62 year old presented with sudden R sided weakness. Fluctuating exam initially.  Brought to OR for clipping of aneurysm L PCOM and evacuation of L temporal hematoma.   PAST MEDICAL HISTORY :  She  has no past medical history on file.  PAST SURGICAL HISTORY: She  has a past surgical history that includes Cesarean section.  No Known Allergies  No current facility-administered medications on file prior to encounter.    Current Outpatient Medications on File Prior to Encounter  Medication Sig  . acetaminophen (TYLENOL) 500 MG tablet Take 1 tablet (500 mg total) by mouth every 6 (six) hours as needed. (Patient taking differently: Take 500 mg by mouth every 6 (six) hours as needed for mild pain. )  . Aspirin-Salicylamide-Caffeine (BC HEADACHE POWDER PO) Take 1 packet by mouth daily as needed (headache).    FAMILY HISTORY:  Her has no family status information on file.    SOCIAL HISTORY: She  reports that she has never smoked. She does not have any smokeless tobacco history on file. She reports that she does not drink alcohol or use drugs.  REVIEW OF SYSTEMS:   No intercurrent illness reported by family.   SUBJECTIVE:  Patient has remained lethargic and hemiplegic since OR  VITAL SIGNS: BP (!) 141/63   Pulse 92   Temp 98.3 F (36.8 C) (Axillary)   Resp 19   Ht  (1.549 m)   Wt 153 lb 14.1 oz (69.8 kg)   SpO2 100%   BMI 29.08 kg/m   HEMODYNAMICS:   Hypertensive requiring nicardipine infusion.   VENTILATOR SETTINGS: Vent Mode: PRVC FiO2 (%):  [40 %] 40 % Set Rate:  [14 bmp] 14 bmp Vt Set:  [380 mL] 380 mL PEEP:  [5 cmH20] 5 cmH20 Pressure Support:  [5 cmH20-10 cmH20] 5 cmH20 Plateau  Pressure:  [12 cmH20] 12 cmH20  INTAKE / OUTPUT: I/O last 3 completed shifts: In: 3981.4 [I.V.:3381.4; IV Piggyback:600] Out: 3516 [Urine:3415; Drains:1; Blood:100]  PHYSICAL EXAMINATION: General:  Appears stated age . Neuro:  No eye opening. Facial grimacing to pain. Semi-purposeful on L side. R hemiplegia with hemisensory loss.  EVD at 14cmH2O with minimal drainage of clear CSF. HEENT:  7.60mm ETT in place.  Cardiovascular:  HS normal, no edema. Extremities warm. Nicardipine at 15 to maintain SBP< 140. Lungs:  PRVC ventilation. No accessory muscle use or dyssynchrony. Chest clear.  Abdomen:  Soft and non-tender with active bowel sounds. Skin:  Surgical incision wrapped with minimal drainage.   LABS:  BMET Recent Labs  Lab 08/14/17 0648 08/14/17 1643 08/15/17 0532  NA 139  143 143 143  K 2.9*  2.9* 3.6 3.3*  CL 105  106 111 111  CO2 22 21* 23  BUN 7  7 5* 6  CREATININE 0.86  0.60 0.70 0.62  GLUCOSE 188*  189* 186* 148*    Electrolytes Recent Labs  Lab 08/14/17 0648 08/14/17 1643 08/15/17 0532  CALCIUM 9.5 8.7* 8.6*    CBC Recent Labs  Lab 08/14/17 0648 08/15/17 0532  WBC 12.5* 14.2*  HGB 11.8*  12.6 9.5*  HCT 35.8*  37.0 28.8*  PLT 281 230    Coag's Recent Labs  Lab 08/14/17 [phone]  APTT 25  INR 1.01    Sepsis Markers No results for input(s): LATICACIDVEN, PROCALCITON, O2SATVEN in the last 168 hours.  ABG No results for input(s): PHART, PCO2ART, PO2ART in the last 168 hours.  Liver Enzymes Recent Labs  Lab 08/14/17 0648  AST 22  ALT 16  ALKPHOS 66  BILITOT 0.6  ALBUMIN 3.9    Cardiac Enzymes No results for input(s): TROPONINI, PROBNP in the last 168 hours.  Glucose Recent Labs  Lab 08/14/17 0701 08/14/17 1759 08/14/17 2328 08/15/17 0621  GLUCAP 223* 149* 141* 147*    Imaging Ct Angio Head W Or Wo Contrast  Result Date: 08/14/2017 CLINICAL DATA:  Intracranial hemorrhage EXAM: CT ANGIOGRAPHY HEAD AND NECK TECHNIQUE:  Multidetector CT imaging of the head and neck was performed using the standard protocol during bolus administration of intravenous contrast. Multiplanar CT image reconstructions and MIPs were obtained to evaluate the vascular anatomy. Carotid stenosis measurements (when applicable) are obtained utilizing NASCET criteria, using the distal internal carotid diameter as the denominator. CONTRAST:  50 mL ISOVUE-370 IOPAMIDOL (ISOVUE-370) INJECTION 76% COMPARISON:  CT head 08/14/2017 FINDINGS: CTA NECK FINDINGS Aortic arch: Normal aortic arch and proximal great vessels. Right carotid system: Right carotid system widely patent. Bifurcation widely patent. Left carotid system: Left carotid system widely patent. Left carotid bifurcation patent. Vertebral arteries: Both vertebral arteries widely patent without stenosis or irregularity. Skeleton: Degenerative changes in the cervical spine with spondylosis and anterolisthesis C3-4. No acute skeletal abnormality. Dental caries. Other neck: Negative for mass or adenopathy in the neck. Upper chest: Visualized lung apices clear. Review of the MIP images confirms the above findings CTA HEAD FINDINGS Anterior circulation: Right cavernous carotid widely patent. Right anterior and middle cerebral arteries widely patent Left cavernous carotid widely patent. 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 This apex extends into the left temporal hematoma. Hematoma felt to be due to rupture of this aneurysm. Left anterior and middle cerebral arteries widely patent without stenosis or vasospasm. Posterior circulation: Both vertebral arteries patent to the basilar. Basilar widely patent. Right PICA patent. Left PICA not visualized. Dominant left AICA. Superior cerebellar and posterior cerebral arteries patent bilaterally without stenosis. No aneurysm in the posterior circulation. Venous sinuses: Normal venous  enhancement Anatomic variants: None Delayed phase: Left temporal hematoma unchanged. Subdural hematoma left frontal lobe and along the tentorium on the left also unchanged. Intraventricular hemorrhage unchanged. Mild hydrocephalus. 11 mm midline shift to the right unchanged. No new hemorrhage since the CT earlier today. 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 Extensive intracranial hemorrhage is stable from earlier this morning. No other aneurysm. No significant atherosclerotic disease. These results were called by telephone at the time of interpretation on 08/14/2017 at 10:37 am to Dr. Conchita Paris , who verbally acknowledged these results. Electronically Signed   By: Marlan Palau M.D.   On: 08/14/2017 10:37   Ct Head Wo Contrast  Result Date: 08/14/2017 CLINICAL DATA:  62 y/o  F; 08/14/2017 CT head and CTA head. EXAM: CT HEAD WITHOUT CONTRAST TECHNIQUE: Contiguous axial images were obtained from the base of the skull through the vertex without intravenous contrast. COMPARISON:  08/14/2017 CT head and CTA head. FINDINGS: Brain: Interval left frontal and temporal craniotomy postsurgical changes with air and edema in the overlying scalp. Small  volume pneumocephalus overlying the frontal lobes. Interval placement of a left posterior communicating artery aneurysm clip in the left suprasellar cistern. Interval evacuation of hematoma within the left anterior temporal lobe with small residual fluid-filled cavity. Stable volume of intraventricular hemorrhage within the left greater than right lateral ventricles, third ventricle, and fourth ventricle. Stable small volume of hemorrhage overlying the left tentorium cerebelli. Interval placement of a right frontal approach ventriculostomy catheter with tip in the frontal horn of right lateral  ventricle. Stable ventricle size. Decreased left-to-right midline shift of 9 mm, previously 11 mm. No downward herniation. Vascular: Interval placement of left suprasellar cistern aneurysm clips. Skull: Postsurgical changes related to left frontal and temporal craniotomy with air and edema in the overlying scalp as well as skin staples. Sinuses/Orbits: No acute finding. Other: None. IMPRESSION: 1. Interval left frontal and temporal craniotomy, left anterior temporal lobe hematoma evacuation, right frontal approach ventriculostomy catheter, and clipping of left posterior communicating artery aneurysm. 2. Associated postsurgical changes with air and edema in the overlying scalp and small volume of pneumocephalus. 3. Stable residual intraventricular hemorrhage and subdural along the left tentorium cerebelli. 4. No new acute intracranial abnormality. 5. Decreased mass effect with 9 mm left-to-right midline shift, previously 11 mm. Electronically Signed   By: Mitzi Hansen M.D.   On: 08/14/2017 23:20   Ct Angio Neck W Or Wo Contrast  Result Date: 08/14/2017 CLINICAL DATA:  Intracranial hemorrhage EXAM: CT ANGIOGRAPHY HEAD AND NECK TECHNIQUE: Multidetector CT imaging of the head and neck was performed using the standard protocol during bolus administration of intravenous contrast. Multiplanar CT image reconstructions and MIPs were obtained to evaluate the vascular anatomy. Carotid stenosis measurements (when applicable) are obtained utilizing NASCET criteria, using the distal internal carotid diameter as the denominator. CONTRAST:  50 mL ISOVUE-370 IOPAMIDOL (ISOVUE-370) INJECTION 76% COMPARISON:  CT head 08/14/2017 FINDINGS: CTA NECK FINDINGS Aortic arch: Normal aortic arch and proximal great vessels. Right carotid system: Right carotid system widely patent. Bifurcation widely patent. Left carotid system: Left carotid system widely patent. Left carotid bifurcation patent. Vertebral arteries: Both vertebral  arteries widely patent without stenosis or irregularity. Skeleton: Degenerative changes in the cervical spine with spondylosis and anterolisthesis C3-4. No acute skeletal abnormality. Dental caries. Other neck: Negative for mass or adenopathy in the neck. Upper chest: Visualized lung apices clear. Review of the MIP images confirms the above findings CTA HEAD FINDINGS Anterior circulation: Right cavernous carotid widely patent. Right anterior and middle cerebral arteries widely patent Left cavernous carotid widely patent. 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 This apex extends into the left temporal hematoma. Hematoma felt to be due to rupture of this aneurysm. Left anterior and middle cerebral arteries widely patent without stenosis or vasospasm. Posterior circulation: Both vertebral arteries patent to the basilar. Basilar widely patent. Right PICA patent. Left PICA not visualized. Dominant left AICA. Superior cerebellar and posterior cerebral arteries patent bilaterally without stenosis. No aneurysm in the posterior circulation. Venous sinuses: Normal venous enhancement Anatomic variants: None Delayed phase: Left temporal hematoma unchanged. Subdural hematoma left frontal lobe and along the tentorium on the left also unchanged. Intraventricular hemorrhage unchanged. Mild hydrocephalus. 11 mm midline shift to the right unchanged. No new hemorrhage since the CT earlier today. 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  Extensive intracranial hemorrhage is stable from earlier this morning. No other aneurysm. No significant atherosclerotic disease. These results were called by telephone at the time of interpretation on 08/14/2017 at 10:37 am to Dr. Conchita Paris , who  verbally acknowledged these results. Electronically Signed   By: Marlan Palau M.D.   On: 08/14/2017 10:37     STUDIES:  NA  CULTURES: NA  ANTIBIOTICS: Completed perioperative antibiotics.  SIGNIFICANT EVENTS: 5/15 - Clipping PCOM aneurysm and clot evacuation.   LINES/TUBES: EVD, Foley arterial line, PIV  DISCUSSION: Unexplained lethargy following aneurysm clipping. Possible intraoperative or clot related vascular compromise. CTA if not more awake this pm.  ASSESSMENT / PLAN:  PULMONARY A: Acute respiratory failure from inability to protect airway.  P:   Maintain Control ventilation at this time to avoid, hypoxia or hypercarbia induced secondary neurological injury.  VAP bundle in place. SBT not appropriate today  CARDIOVASCULAR A:  Secured aneurysm. No signs of Neurogenic cardiac events. P:  Control SBP < 140 for next 24h then permissive hypertension. Secondary Prevention: n/a  RENAL A:   Even fluid balance.  Volume Status: P:   Increase fluids to maintain slight hypervolemia. Monitor for cerebral salt wasting. Target Na 144- 150. Foley: required to precisely monitor urine output.  GASTROINTESTINAL A:   Nutritional Status: Moderate nutritional risk. P:   Initiate tube feeds if not extubated in next 48h.  HEMATOLOGIC A:   Acute blood loss anemia.DVT prophylaxis: Mechanical only now; Transfusion requirements: none P:  Continue current management.  INFECTIOUS A:   Mild stress leukocytosis.  P:   NA Line Removal: all currently required.   ENDOCRINE A:   Euglycemic on current therapy   Glycemic control: monitor; Stress steroids: none. P:   N/A  NEUROLOGIC A:   Prolonged coma following surgery. Unlikely sedation or anesthesia related at this point. Possible cerebral infarction or perioperative edema related.  P:   RASS goal: 0 Keep off sedative if possible. Likely can be extubated if become agitated.   FAMILY  - Updates:  daughter at  bedside.   - Inter-disciplinary family meet or Palliative Care meeting due by:  n/a  CRITICAL CARE Performed by: Lynnell Catalan  Total critical care time: 45 minutes  Critical care time was exclusive of separately billable procedures and treating other patients.  Critical care was necessary to treat or prevent imminent or life-threatening deterioration.  Critical care was time spent personally by me on the following activities: development of treatment plan with patient and/or surrogate as well as nursing, discussions with consultants, evaluation of patient's response to treatment, examination of patient, obtaining history from patient or surrogate, ordering and performing treatments and interventions, ordering and review of laboratory studies, ordering and review of radiographic studies, pulse oximetry and re-evaluation of patient's condition.  Lynnell Catalan, MD [facility] Pulmonary and Critical Care Medicine [facility] Pager: [phone] After hours [phone]  08/15/2017, 9:46 AM

## 2017-08-15 NOTE — Progress Notes (Addendum)
STROKE TEAM PROGRESS NOTE    INTERVAL HISTORY Husband at bedside. Pt intubated post crani w/ clip, hematoma evacuation and EVD placement. not waking up post op. CT head during the night shows nothing to cause. Not following commands. Moves the left  Side only. Doubt can extubate, defer to PCCM.   Vitals:   08/15/17 0630 08/15/17 0635 08/15/17 0645 08/15/17 0700  BP: 111/68  110/70 109/69  Pulse: 90 89 90 86  Resp: Temp:      TempSrc:      SpO2: 100% 100% 100% 100%  Weight:      Height:        CBC:  Recent Labs  Lab 08/14/17 0648 08/15/17 0532  WBC 12.5* 14.2*  NEUTROABS 5.5 11.8*  HGB 11.8*  12.6 9.5*  HCT 35.8*  37.0 28.8*  MCV 88.6 88.9  PLT 281 230    Basic Metabolic Panel:  Recent Labs  Lab 08/14/17 1643 08/15/17 0532  NA 143 143  K 3.6 3.3*  CL 111 111  CO2 21* 23  GLUCOSE 186* 148*  BUN 5* 6  CREATININE 0.70 0.62  CALCIUM 8.7* 8.6*   Lipid Panel:     Component Value Date/Time   TRIG 41 08/14/2017 2105   HgbA1c:  Lab Results  Component Value Date   HGBA1C 6.0 (H) 08/15/2017   Urine Drug Screen:     Component Value Date/Time   LABOPIA NONE DETECTED 08/14/2017 0644   COCAINSCRNUR NONE DETECTED 08/14/2017 0644   LABBENZ NONE DETECTED 08/14/2017 0644   AMPHETMU NONE DETECTED 08/14/2017 0644   THCU NONE DETECTED 08/14/2017 0644   LABBARB NONE DETECTED 08/14/2017 0644    Alcohol Level     Component Value Date/Time   ETH <10 08/14/2017 0647    IMAGING Ct Angio Head W Or Wo Contrast  Result Date: 08/14/2017 CLINICAL DATA:  Intracranial hemorrhage EXAM: CT ANGIOGRAPHY HEAD AND NECK TECHNIQUE: Multidetector CT imaging of the head and neck was performed using the standard protocol during bolus administration of intravenous contrast. Multiplanar CT image reconstructions and MIPs were obtained to evaluate the vascular anatomy. Carotid stenosis measurements (when applicable) are obtained utilizing NASCET criteria, using the distal  internal carotid diameter as the denominator. CONTRAST:  50 mL ISOVUE-370 IOPAMIDOL (ISOVUE-370) INJECTION 76% COMPARISON:  CT head 08/14/2017 FINDINGS: CTA NECK FINDINGS Aortic arch: Normal aortic arch and proximal great vessels. Right carotid system: Right carotid system widely patent. Bifurcation widely patent. Left carotid system: Left carotid system widely patent. Left carotid bifurcation patent. Vertebral arteries: Both vertebral arteries widely patent without stenosis or irregularity. Skeleton: Degenerative changes in the cervical spine with spondylosis and anterolisthesis C3-4. No acute skeletal abnormality. Dental caries. Other neck: Negative for mass or adenopathy in the neck. Upper chest: Visualized lung apices clear. Review of the MIP images confirms the above findings CTA HEAD FINDINGS Anterior circulation: Right cavernous carotid widely patent. Right anterior and middle cerebral arteries widely patent Left cavernous carotid widely patent. 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 This apex extends into the left temporal hematoma. Hematoma felt to be due to rupture of this aneurysm. Left anterior and middle cerebral arteries widely patent without stenosis or vasospasm. Posterior circulation: Both vertebral arteries patent to the basilar. Basilar widely patent. Right PICA patent. Left PICA not visualized. Dominant left AICA. Superior cerebellar and posterior cerebral arteries patent bilaterally without stenosis. No aneurysm in the posterior circulation.  Venous sinuses: Normal venous enhancement Anatomic variants: None Delayed phase: Left temporal hematoma unchanged. Subdural hematoma left frontal lobe and along the tentorium on the left also unchanged. Intraventricular hemorrhage unchanged. Mild hydrocephalus. 11 mm midline shift to the right unchanged. No new hemorrhage since the CT earlier today. 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 Extensive intracranial hemorrhage is stable from earlier this morning. No other aneurysm. No significant atherosclerotic disease. These results were called by telephone at the time of interpretation on 08/14/2017 at 10:37 am to Dr. Conchita Paris , who verbally acknowledged these results. Electronically Signed   By: Marlan Palau M.D.   On: 08/14/2017 10:37   Ct Head Wo Contrast  Result Date: 08/14/2017 CLINICAL DATA:  62 y/o  F; 08/14/2017 CT head and CTA head. EXAM: CT HEAD WITHOUT CONTRAST TECHNIQUE: Contiguous axial images were obtained from the base of the skull through the vertex without intravenous contrast. COMPARISON:  08/14/2017 CT head and CTA head. FINDINGS: Brain: Interval left frontal and temporal craniotomy postsurgical changes with air and edema in the overlying scalp. Small volume pneumocephalus overlying the frontal lobes. Interval placement of a left posterior communicating artery aneurysm clip in the left suprasellar cistern. Interval evacuation of hematoma within the left anterior temporal lobe with small residual fluid-filled cavity. Stable volume of intraventricular hemorrhage within the left greater than right lateral ventricles, third ventricle, and fourth ventricle. Stable small volume of hemorrhage overlying the left tentorium cerebelli. Interval placement of a right frontal approach ventriculostomy catheter with tip in the frontal horn of right lateral ventricle. Stable ventricle size. Decreased left-to-right midline shift of 9 mm, previously 11 mm. No downward herniation. Vascular: Interval placement of left suprasellar cistern aneurysm clips. Skull: Postsurgical changes related to left frontal and temporal craniotomy with air and edema in the overlying scalp as well as skin staples.  Sinuses/Orbits: No acute finding. Other: None. IMPRESSION: 1. Interval left frontal and temporal craniotomy, left anterior temporal lobe hematoma evacuation, right frontal approach ventriculostomy catheter, and clipping of left posterior communicating artery aneurysm. 2. Associated postsurgical changes with air and edema in the overlying scalp and small volume of pneumocephalus. 3. Stable residual intraventricular hemorrhage and subdural along the left tentorium cerebelli. 4. No new acute intracranial abnormality. 5. Decreased mass effect with 9 mm left-to-right midline shift, previously 11 mm. Electronically Signed   By: Mitzi Hansen M.D.   On: 08/14/2017 23:20   Ct Angio Neck W Or Wo Contrast  Result Date: 08/14/2017 CLINICAL DATA:  Intracranial hemorrhage EXAM: CT ANGIOGRAPHY HEAD AND NECK TECHNIQUE: Multidetector CT imaging of the head and neck was performed using the standard protocol during bolus administration of intravenous contrast. Multiplanar CT image reconstructions and MIPs were obtained to evaluate the vascular anatomy. Carotid stenosis measurements (when applicable) are obtained utilizing NASCET criteria, using the distal internal carotid diameter as the denominator. CONTRAST:  50 mL ISOVUE-370 IOPAMIDOL (ISOVUE-370) INJECTION 76% COMPARISON:  CT head 08/14/2017 FINDINGS: CTA NECK FINDINGS Aortic arch: Normal aortic arch and proximal great vessels. Right carotid system: Right carotid system widely patent. Bifurcation widely patent. Left carotid system: Left carotid system widely patent. Left carotid bifurcation patent. Vertebral arteries: Both vertebral arteries widely patent without stenosis or irregularity. Skeleton: Degenerative changes in the cervical spine with spondylosis and anterolisthesis C3-4. No acute skeletal abnormality. Dental caries. Other neck: Negative for mass or adenopathy  in the neck. Upper chest: Visualized lung apices clear. Review of the MIP images confirms the  above findings CTA HEAD FINDINGS Anterior circulation: Right cavernous carotid widely patent. Right anterior and middle cerebral arteries widely patent Left cavernous carotid widely patent. 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 This apex extends into the left temporal hematoma. Hematoma felt to be due to rupture of this aneurysm. Left anterior and middle cerebral arteries widely patent without stenosis or vasospasm. Posterior circulation: Both vertebral arteries patent to the basilar. Basilar widely patent. Right PICA patent. Left PICA not visualized. Dominant left AICA. Superior cerebellar and posterior cerebral arteries patent bilaterally without stenosis. No aneurysm in the posterior circulation. Venous sinuses: Normal venous enhancement Anatomic variants: None Delayed phase: Left temporal hematoma unchanged. Subdural hematoma left frontal lobe and along the tentorium on the left also unchanged. Intraventricular hemorrhage unchanged. Mild hydrocephalus. 11 mm midline shift to the right unchanged. No new hemorrhage since the CT earlier today. 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 Extensive intracranial hemorrhage is stable from earlier this morning. No other aneurysm. No significant atherosclerotic disease. These results were called by telephone at the time of interpretation on 08/14/2017 at 10:37 am to Dr. Conchita Paris , who verbally acknowledged these results. Electronically Signed   By: Marlan Palau M.D.   On: 08/14/2017 10:37   Ct Head Code Stroke Wo Contrast  Result Date: 08/14/2017 CLINICAL DATA:  Code stroke.  Flaccid right side. EXAM: CT HEAD WITHOUT CONTRAST TECHNIQUE: Contiguous axial images were obtained from the base of the skull through  the vertex without intravenous contrast. COMPARISON:  None. FINDINGS: Brain: 4.2 x 3.4 x 2.1 cm (estimated volume 12 cc) intraparenchymal hematoma in the medial left temporal lobe with intraventricular penetration in filling of the left lateral ventricle with blood. Smaller amount of blood in the right lateral ventricle. Third ventricle and fourth ventricle are filled. Mild edema surrounding the left temporal lobe hematoma suggesting that this is not hyperacute. Mass effect with right-to-left shift of 11 mm. Otherwise, the brain shows water probably chronic small-vessel ischemic changes of the deep white matter. There is subdural blood along the convexity on the left with maximal thickness of 6 mm. Small amount of blood also along the left tentorium. Vascular: Normal Skull: Normal Sinuses/Orbits: Clear/normal Other: None ASPECTS (Alberta Stroke Program Early CT Score) - Ganglionic level infarction (caudate, lentiform nuclei, internal capsule, insula, M1-M3 cortex): 7 - Supraganglionic infarction (M4-M6 cortex): 3 Total score (0-10 with 10 being normal): 10 IMPRESSION: 1. Recent intraparenchymal hemorrhage affecting the mesial temporal lobe on the left with a hematoma 12 cc volume. Intraventricular penetration. Small amount of surrounding edema suggesting that this may be several hours to a day old. Mass effect with left-to-right shift of 11 mm. 6 mm subdural hematoma along the lateral convexity on the left with a small amount along the left tentorium. 2. ASPECTS is 10. 3. Dr. Amada Jupiter aware of the results, with neuro surgery contact. Electronically Signed   By: Paulina Fusi M.D.   On: 08/14/2017 07:09    PHYSICAL EXAM Constitutional: Appears well-developed and well-nourished.  Psych: Affect appropriate to situation Eyes: No scleral injection HENT: No OP obstrucion Head: Normocephalic.  Cardiovascular: Normal rate and regular rhythm.  Respiratory: Effort normal and breath sounds normal to  anterior  ascultation. +cough +gag Skin: WDI  Neuro: Mental Status: Patient is intubated. Does not follows commands  Cranial Nerves: II: blinks to threat from the left but not right. Pupils are equal, round, and reactive to light.   III,IV, VI: left gaze preference  absent reflex eye moments of the right. V: Facial sensation is symmetric to temperature VII: Facial movement with  right facial droop.  VIII: hearing is intact to voice X: Uvula elevates symmetrically XI: Shoulder shrug is symmetric. XII: tongue is midline without atrophy or fasciculations.  Motor: dense R arm and leg plegia. Withdraws L leg to pain and has antigravity strength on left.  Sensory: Sensation is diminished on the right Cerebellar: Does not perform   ASSESSMENT/PLAN Carla Bauer is a 62 y.o. female with no significant history found having fallen out of the bed with R arm weakness and altered mental status.   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 May be direct effect of OR. Consider CTA with next imaging. Will discuss with NS  Code Stroke CT head L mesial temporal hmg 12 cc w/ IVH. Surrounding edema with L to R shift 11mm. 6mm SDH along L convexity. ASPECTS 10.     CTA head & neck LPCom aneurysm c/w rupture into L temporal lobe. Extensive hmg stable from earlier.  CT head interval L frontal and temp crani, L ant temporal lobe hematoma evac w/ R fronal appoach, clip L PCom. Post OR changes w/ air and edema. Stable IVH and SDH. No new abn. Decreased mass effect to 9mm L to R  TCD MWF. To start 5/17  Place OG.   Start nimonidpine  Increase NS to 100cc/hr  HIV pending   SCDs for VTE prophylaxis  No antithrombotic prior to admission (Goody Powders prn)  Therapy recommendations:  pending   Disposition:  pending   Respiratory  failure  Intubated for crani  PCCM following  Leukocytosis  WBC 12.5.   Afebrile  UA no infection, CXR not performed  Hyperglycemia  No hx diabetes. Had decadron this am  CBGs - 147 -141 - 149  HgbA1c 6.0  DB RN following  Blood pressure  No hx hypertension  166/100 on arrival  SBP goal < 140  maxed out on cardene drip  Other Stroke Risk Factors  No family hx aneurysms  Other Active Problems  Hypokalemia 3.3   Leukocytosis 14.2  Anemia 9.5  Hospital day # 1  Carla Main, MSN, APRN, ANVP-BC, AGPCNP-BC Advanced Practice Stroke Nurse  Stroke Center See Amion for Schedule & Pager information 08/15/2017 8:19 AM  I have personally examined this patient, reviewed notes, independently viewed imaging studies, participated in medical decision making and plan of care.ROS completed by me personally and pertinent positives fully documented  I have made any additions or clarifications directly to the above note. Agree with note above.  The patient remains aphasic with dense right hemiplegia following her aneurysm surgery anxiety etiology of this is unclear. Recommend checking CT angiogram this afternoon though the time course for vasospasm could be too early. Start Nimodipine as per protocol and transcranial Dopplers to monitor for vasospasm.increase hydration and a CT angiogram suggest vasospasm may need triple H therapy. Long discussion at the bedside with the patient's husband about her prognosis and answered questions. Discussed with  Dr. Denese Killings This patient is critically ill and at significant risk of neurological worsening, death and care requires constant monitoring of vital signs, hemodynamics,respiratory and cardiac monitoring, extensive review of multiple databases, frequent neurological assessment, discussion with family, other specialists and medical decision making of high complexity.I have made any additions or clarifications directly to the above  note.This critical care time does not reflect procedure time, or teaching time or supervisory time of PA/NP/Med Resident etc but could involve care discussion time.  I spent 35 minutes of neurocritical care time  in the care of  this patient.     Delia Heady, MD Medical Director [facility] Stroke Center Pager: [phone] 08/15/2017 1:53 PM   To contact Stroke Continuity provider, please refer to [email or web address]. After hours, contact General Neurology

## 2017-08-15 NOTE — Progress Notes (Signed)
  NEUROSURGERY PROGRESS NOTE   Pt seen and examined. Has remained intubated, been very somnolent. CT done overnight.  EXAM: Temp:  [98.2 F (36.8 C)-99.3 F (37.4 C)] 98.7 F (37.1 C) (05/16 1600) Pulse Rate:  [76-112] 76 (05/16 1600) Resp:  [9-25] 18 (05/16 1600) BP: (100-157)/(59-92) 106/61 (05/16 1600) SpO2:  [100 %] 100 % (05/16 1600) Arterial Line BP: (116-161)/(53-76) 135/58 (05/16 1600) FiO2 (%):  [40 %-100 %] 100 % (05/16 1557) Intake/Output      05/15 0701 - 05/16 0700 05/16 0701 - 05/17 0700   P.O.  3   I.V. (mL/kg) 3381.4 (48.4) 1795.3 (25.7)   NG/GT  90   IV Piggyback 600 50   Total Intake(mL/kg) 3981.4 (57) 1938.3 (27.8)   Urine (mL/kg/hr) 3415 (2) 800 (1.1)   Drains 1 3   Blood 100    Total Output 3516 803   Net +465.4 +1135.3          Opens eyes to pain Pupils reactive Does follow commands LUE/LLE Moves RUE/RLE to noxious stimuli Headwrap in place, c/d/i EVD with clear CSF  LABS: Lab Results  Component Value Date   CREATININE 0.60 08/15/2017   BUN 5 (L) 08/15/2017   NA 144 08/15/2017   K 3.2 (L) 08/15/2017   CL 114 (H) 08/15/2017   CO2 22 08/15/2017   Lab Results  Component Value Date   WBC 14.2 (H) 08/15/2017   HGB 9.5 (L) 08/15/2017   HCT 28.8 (L) 08/15/2017   MCV 88.9 08/15/2017   PLT 230 08/15/2017    IMAGING: CTH reviewed, unchanged MLS, casting of left lateral, 3rd, and 4th ventricles.Evacuation of left temporal hematoma. No CT evidence of stroke.  IMPRESSION: - 62 y.o. female POD#1 SAH d#2 s/p clipping left Pcom aneurysm. Has been somnolent since surgery, but appears to be somewhat improving.  PLAN: - Planning on CTA - Cont supportive care. SBP up to ok today, can go up to tomorrow - 100% O2 for pneumocephalus

## 2017-08-15 NOTE — Progress Notes (Signed)
Pt placed on 100% fio2 for 24 hours per Dr. Denese Killings to provide nitrogen washout.

## 2017-08-15 NOTE — Progress Notes (Signed)
SLP Cancellation Note  Patient Details Name: Carla Bauer MRN: [id number] DOB: Feb 04, 1956   Cancelled treatment:       Reason Eval/Treat Not Completed: Patient not medically ready   Tailer Volkert, Riley Nearing 08/15/2017, 8:01 AM

## 2017-08-15 NOTE — Progress Notes (Signed)
OT Cancellation Note  Patient Details Name: Carla Bauer MRN: [id number] DOB: [birth year]   Cancelled Treatment:    Reason Eval/Treat Not Completed: Patient not medically ready.  Pt currently on bedrest.  Jeani Hawking, OTR/L [phone]   Jeani Hawking M 08/15/2017, 7:24 AM

## 2017-08-16 ENCOUNTER — Inpatient Hospital Stay (HOSPITAL_COMMUNITY): Payer: Medicaid Other

## 2017-08-16 DIAGNOSIS — I671 Cerebral aneurysm, nonruptured: Secondary | ICD-10-CM

## 2017-08-16 DIAGNOSIS — G911 Obstructive hydrocephalus: Secondary | ICD-10-CM

## 2017-08-16 DIAGNOSIS — I340 Nonrheumatic mitral (valve) insufficiency: Secondary | ICD-10-CM

## 2017-08-16 LAB — LIPID PANEL
CHOL/HDL RATIO: 2.3 ratio
CHOLESTEROL: 120 mg/dL (ref 0–200)
HDL: 53 mg/dL (ref >40)
LDL Cholesterol: 52 mg/dL (ref 0–99)
TRIGLYCERIDES: 77 mg/dL (ref <150)
VLDL: 15 mg/dL (ref 0–40)

## 2017-08-16 LAB — MAGNESIUM: Magnesium: 2 mg/dL (ref 1.7–2.4)

## 2017-08-16 LAB — CBC WITH DIFFERENTIAL/PLATELET
ABS IMMATURE GRANULOCYTES: 0.2 10*3/uL — AB (ref 0.0–0.1)
BASOS ABS: 0 10*3/uL (ref 0.0–0.1)
Basophils Relative: 0 %
EOS PCT: 0 %
Eosinophils Absolute: 0 10*3/uL (ref 0.0–0.7)
HEMATOCRIT: 27.3 % — AB (ref 36.0–46.0)
HEMOGLOBIN: 9 g/dL — AB (ref 12.0–15.0)
Immature Granulocytes: 1 %
LYMPHS ABS: 2.2 10*3/uL (ref 0.7–4.0)
LYMPHS PCT: 11 %
MCH: 29.7 pg (ref 26.0–34.0)
MCHC: 33 g/dL (ref 30.0–36.0)
MCV: 90.1 fL (ref 78.0–100.0)
MONO ABS: 1.4 10*3/uL — AB (ref 0.1–1.0)
MONOS PCT: 7 %
NEUTROS ABS: 16.1 10*3/uL — AB (ref 1.7–7.7)
Neutrophils Relative %: 81 %
Platelets: 220 10*3/uL (ref 150–400)
RBC: 3.03 MIL/uL — ABNORMAL LOW (ref 3.87–5.11)
RDW: 14.4 % (ref 11.5–15.5)
WBC: 19.9 10*3/uL — ABNORMAL HIGH (ref 4.0–10.5)

## 2017-08-16 LAB — GLUCOSE, CAPILLARY
GLUCOSE-CAPILLARY: 135 mg/dL — AB (ref 65–99)
Glucose-Capillary: 110 mg/dL — ABNORMAL HIGH (ref 65–99)
Glucose-Capillary: 143 mg/dL — ABNORMAL HIGH (ref 65–99)
Glucose-Capillary: 152 mg/dL — ABNORMAL HIGH (ref 65–99)
Glucose-Capillary: 155 mg/dL — ABNORMAL HIGH (ref 65–99)

## 2017-08-16 LAB — ECHOCARDIOGRAM COMPLETE
Height: 61 in
WEIGHTICAEL: 2462.1 [oz_av]

## 2017-08-16 LAB — BASIC METABOLIC PANEL
ANION GAP: 8 (ref 5–15)
BUN: 5 mg/dL — ABNORMAL LOW (ref 6–20)
CHLORIDE: 114 mmol/L — AB (ref 101–111)
CO2: 23 mmol/L (ref 22–32)
CREATININE: 0.57 mg/dL (ref 0.44–1.00)
Calcium: 8.6 mg/dL — ABNORMAL LOW (ref 8.9–10.3)
GFR calc non Af Amer: >60 mL/min (ref >60)
Glucose, Bld: 159 mg/dL — ABNORMAL HIGH (ref 65–99)
Potassium: 3 mmol/L — ABNORMAL LOW (ref 3.5–5.1)
SODIUM: 145 mmol/L (ref 135–145)

## 2017-08-16 MED ORDER — ORAL CARE MOUTH RINSE
15.0000 mL | Freq: Two times a day (BID) | OROMUCOSAL | Status: DC
  Administered 2017-08-16 – 2017-08-27 (×22): 15 mL via OROMUCOSAL

## 2017-08-16 MED ORDER — PRO-STAT SUGAR FREE PO LIQD
30.0000 mL | Freq: Every day | ORAL | Status: DC
  Administered 2017-08-17 – 2017-09-03 (×17): 30 mL
  Filled 2017-08-16 (×18): qty 30

## 2017-08-16 MED ORDER — OSMOLITE 1.2 CAL PO LIQD
1000.0000 mL | ORAL | Status: DC
  Administered 2017-08-16 – 2017-09-02 (×17): 1000 mL
  Filled 2017-08-16 (×26): qty 1000

## 2017-08-16 MED ORDER — LABETALOL HCL 5 MG/ML IV SOLN
20.0000 mg | INTRAVENOUS | Status: AC | PRN
  Administered 2017-08-18: 20 mg via INTRAVENOUS
  Filled 2017-08-16: qty 4

## 2017-08-16 MED ORDER — IOPAMIDOL (ISOVUE-370) INJECTION 76%
INTRAVENOUS | Status: AC
  Administered 2017-08-16: 01:00:00
  Filled 2017-08-16: qty 50

## 2017-08-16 MED ORDER — LABETALOL HCL 5 MG/ML IV SOLN: 20.0000 mg | INTRAVENOUS | Status: DC | PRN

## 2017-08-16 MED ORDER — IOPAMIDOL (ISOVUE-370) INJECTION 76%: 50.0000 mL | Freq: Once | INTRAVENOUS | Status: DC | PRN

## 2017-08-16 MED ORDER — VITAL HIGH PROTEIN PO LIQD: 1000.0000 mL | ORAL | Status: DC

## 2017-08-16 MED ORDER — PRO-STAT SUGAR FREE PO LIQD: 30.0000 mL | Freq: Two times a day (BID) | ORAL | Status: DC

## 2017-08-16 NOTE — Progress Notes (Signed)
Initial Nutrition Assessment  DOCUMENTATION CODES:   Not applicable  INTERVENTION:   Osmolite 1.2 @ 50 ml/hr (1200 ml/day) 30 ml Prostat daily  Provides: 1540 kcal, 81 grams protein, and 973 ml free water.    NUTRITION DIAGNOSIS:   Inadequate oral intake related to inability to eat as evidenced by NPO status.  GOAL:   Patient will meet greater than or equal to 90% of their needs  MONITOR:   TF tolerance, I & O's  REASON FOR ASSESSMENT:   Consult Enteral/tube feeding initiation and management  ASSESSMENT:   Pt with no PMH on file admitted with SAH due to ruptured PCOM aneurysm s/p clipping with clot evacuation and EVD placement.    Pt discussed during ICU rounds and with RN.  Pt just extubated 5/17 cortrak placed, tip is in the stomach   Medications reviewed and include: senokot-s Labs reviewed: K+ 3 (L)   NUTRITION - FOCUSED PHYSICAL EXAM:    Most Recent Value  Orbital Region  No depletion  Upper Arm Region  No depletion  Thoracic and Lumbar Region  No depletion  Buccal Region  Unable to assess  Temple Region  No depletion  Clavicle Bone Region  No depletion  Clavicle and Acromion Bone Region  No depletion  Scapular Bone Region  Unable to assess  Dorsal Hand  No depletion  Patellar Region  No depletion  Anterior Thigh Region  No depletion  Posterior Calf Region  No depletion  Edema (RD Assessment)  None  Hair  Reviewed  Eyes  Unable to assess  Mouth  Unable to assess  Skin  Reviewed  Nails  Reviewed       Diet Order:   Diet Order           Diet NPO time specified  Diet effective now          EDUCATION NEEDS:   No education needs have been identified at this time  Skin:  Skin Assessment: Reviewed RN Assessment  Last BM:     Height:   Ht Readings from Last 1 Encounters:  08/14/17  (1.549 m)    Weight:   Wt Readings from Last 1 Encounters:  08/16/17 153 lb 14.1 oz (69.8 kg)    Ideal Body Weight:  47.7 kg  BMI:  Body  mass index is 29.08 kg/m.  Estimated Nutritional Needs:   Kcal:  1500-1700  Protein:  80-90 grams  Fluid:  > 1.5 L/day  Kendell Bane RD, LDN, CNSC [phone] Pager [phone] After Hours Pager

## 2017-08-16 NOTE — Progress Notes (Signed)
OT Cancellation Note  Patient Details Name: Carla Bauer MRN: [id number] DOB: 1955/04/26   Cancelled Treatment:    Reason Eval/Treat Not Completed: Patient not medically ready (Bedrest order set and intubated at this time. Please increase activity as appropriate)   Harolyn Rutherford  [phone] 08/16/2017, 8:04 AM

## 2017-08-16 NOTE — Progress Notes (Signed)
Pt transported from 4N24 to CT1 and back to 4N24 without incidence.

## 2017-08-16 NOTE — Progress Notes (Addendum)
PULMONARY / CRITICAL CARE MEDICINE   Name: Carla Bauer MRN: [id number] DOB: 11-17-1955    ADMISSION DATE:  08/14/2017 CONSULTATION DATE:  08/14/2017  REFERRING MD:  Patric Dykes - CNSA.   CHIEF COMPLAINT:  Aneurysmal subarachnoid hemorrhage.   BRIEF SUMMARY:   61 year old presented with sudden R sided weakness. Fluctuating exam initially.  Brought to OR for clipping of aneurysm L PCOM and evacuation of L temporal hematoma.  Lethargic post-operative.  Mental status improved 5/17 but remains hemiplegic on right.      SUBJECTIVE:  RN reports pt weaning on PSV, appropriate on left.  Cardene gtt off.      VITAL SIGNS: BP (!) 151/71   Pulse 90   Temp 98.7 F (37.1 C) (Axillary)   Resp 17   Ht  (1.549 m)   Wt 153 lb 14.1 oz (69.8 kg)   SpO2 100%   BMI 29.08 kg/m   HEMODYNAMICS:       VENTILATOR SETTINGS: Vent Mode: PSV;CPAP FiO2 (%):  [100 %] 100 % Set Rate:  [14 bmp] 14 bmp Vt Set:  [380 mL] 380 mL PEEP:  [5 cmH20] 5 cmH20 Pressure Support:  [5 cmH20] 5 cmH20 Plateau Pressure:  [11 cmH20-13 cmH20] 12 cmH20  INTAKE / OUTPUT: I/O last 3 completed shifts: In: 5324.8 [P.O.:3; I.V.:4881.8; NG/GT:90; IV Piggyback:350] Out: 3910 [Urine:3865; Drains:45]  PHYSICAL EXAMINATION: General:  Critically ill appearing adult female in NAD, lying in bed HEENT: MM pink/moist, head dressed, EVD with bloody drainage  Neuro: eyes open, left eye swollen, follows commands on left, hemiplegia on right, pupils 3-34mm =/R.   CV: s1s2 rrr, no m/r/g PULM: even/non-labored, lungs bilaterally clear  NU:UVOZ, non-tender, bsx4 active  Extremities: warm/dry, no peripheral edema  Skin: no rashes or lesions  LABS:  BMET Recent Labs  Lab 08/15/17 0532 08/15/17 1533 08/16/17 0316  NA 143 144 145  K 3.3* 3.2* 3.0*  CL 111 114* 114*  CO2 BUN 6 5* 5*  CREATININE 0.62 0.60 0.57  GLUCOSE 148* 176* 159*    Electrolytes Recent Labs  Lab 08/15/17 0532 08/15/17 1533  08/16/17 0316  CALCIUM 8.6* 8.2* 8.6*    CBC Recent Labs  Lab 08/14/17 0648 08/15/17 0532 08/16/17 0316  WBC 12.5* 14.2* 19.9*  HGB 11.8*  12.6 9.5* 9.0*  HCT 35.8*  37.0 28.8* 27.3*  PLT 281 230 220    Coag's Recent Labs  Lab 08/14/17 0648  APTT 25  INR 1.01    Sepsis Markers No results for input(s): LATICACIDVEN, PROCALCITON, O2SATVEN in the last 168 hours.  ABG No results for input(s): PHART, PCO2ART, PO2ART in the last 168 hours.  Liver Enzymes Recent Labs  Lab 08/14/17 0648  AST 22  ALT 16  ALKPHOS 66  BILITOT 0.6  ALBUMIN 3.9    Cardiac Enzymes No results for input(s): TROPONINI, PROBNP in the last 168 hours.  Glucose Recent Labs  Lab 08/14/17 2328 08/15/17 0621 08/15/17 1143 08/15/17 1730 08/15/17 2317 08/16/17 0543  GLUCAP 141* 147* 152* 153* 154* 110*    Imaging Ct Angio Head W Or Wo Contrast  Result Date: 08/16/2017 CLINICAL DATA:  62 y/o F; cerebral aneurysm and subarachnoid hemorrhage. Evaluation for cerebral vasospasm. EXAM: CT ANGIOGRAPHY HEAD TECHNIQUE: Multidetector CT imaging of the head was performed using the standard protocol during bolus administration of intravenous contrast. Multiplanar CT image reconstructions and MIPs were obtained to evaluate the vascular anatomy. CONTRAST:  50 cc Isovue 370 COMPARISON:  08/14/2017  CT angiogram of the head and CT of the head. FINDINGS: CT HEAD Brain: Mild interval decrease in volume of intraventricular hemorrhage. Stable subdural collection over the left anterior frontal convexity subjacent to frontal and temporal craniotomy. Near complete resolution of pneumocephalus. Decreased mass effect with 7 mm of left-to-right midline shift, previously 9 mm. Increased hypoattenuation within the left medial temporal lobe extending into the left posterior lentiform nucleus and posterior limb of internal capsule likely representing evolving early subacute infarction. No new acute intracranial hemorrhage or  herniation. Right frontal approach ventriculostomy catheter is stable in position. No hydrocephalus. Vascular: As below. Skull: Postsurgical changes related to a left frontal and temporal craniotomy with mildly increased edema, but decreased air in the overlying scalp. Sinuses: Imaged portions are clear. Orbits: No acute finding. CTA HEAD Anterior circulation: No significant stenosis, proximal occlusion, new aneurysm, or vascular malformation. Clips left posterior communicating artery aneurysm without residual or recurrent aneurysm identified. Posterior circulation: No significant stenosis, proximal occlusion, aneurysm, or vascular malformation. Venous sinuses: As permitted by contrast timing, patent. Anatomic variants: None significant. Delayed phase: No abnormal intracranial enhancement. IMPRESSION: CT head: 1. Increasing hypoattenuation within the left medial temporal lobe extending to left posterior lentiform nucleus and posterior limb of internal capsule compatible developing early subacute infarction. 2. Mild decrease in volume of intraventricular hemorrhage. 3. Stable subdural collection over the left anterior frontal convexity subjacent to the left frontal and temporal craniotomy. 4. Decreased mass effect with 7 mm of left-to-right midline shift, previously 9 mm. CTA head: 1. No findings of vasospasm. No new large vessel occlusion, aneurysm, or significant stenosis. 2. Left posterior communicating artery aneurysm clip. No residual or recurrent aneurysm identified. Electronically Signed   By: Mitzi Hansen M.D.   On: 08/16/2017 02:20   Dg Abd 1 View  Result Date: 08/15/2017 CLINICAL DATA:  Orogastric tube placement EXAM: ABDOMEN - 1 VIEW COMPARISON:  None. FINDINGS: Orogastric tube coiled in the stomach with the tip projecting over the cardia of the stomach. There is no bowel dilatation to suggest obstruction. There is no evidence of pneumoperitoneum, portal venous gas or pneumatosis. There are  no pathologic calcifications along the expected course of the ureters. The osseous structures are unremarkable. IMPRESSION: Orogastric tube coiled in the stomach with the tip projecting over the cardia of the stomach. Electronically Signed   By: Elige Ko   On: 08/15/2017 11:08     STUDIES:  CT Head 5/17 >> increasing hypoattenuation within the left medial temporal lobe extending to left posterior lentiform nucleus & posterior limb of internal capsule consistent with developing early subacute infarction.  Mild decrease in intraventricular hemorrhage, decreased mass effect at 7mm, decreased air CTA Head 5/17 >> no findings of vasospasm, no new large vessel occlusion, left posterior communicating artery aneurysm clip  CULTURES:   ANTIBIOTICS:   SIGNIFICANT EVENTS: 5/15 - Clipping PCOM aneurysm and clot evacuation.  2 units PRBC's intra-op  LINES/TUBES: EVD 5/15 >>  A-Line 5/15 >>   DISCUSSION: 62 y/o F admitted after fall and right sided weakness.  Concern for PCOM aneurysm rupture s/p neurosurgical clot evacuation, clipping of aneurysm and EVD placement.   Postop lethargy, improved 5/17.  Repeat imaging with concern for new PCA territory infarct.     ASSESSMENT / PLAN:  NEUROLOGIC A:   PCOM Aneurysm Rupture - prolonged coma following surgery. Unlikely sedation or anesthesia related at this point. Possible cerebral infarction or perioperative edema related.  P:   RASS Goal: 0  Minimize sedation  Early PT efforts  SBP goal <160  Appreciate Neurology / NSGY care Monitor TCD's M/W/F Continue nimodipine   PULMONARY A: Acute Respiratory Insufficiency  P:   PRVC as rest mode, avoid hypoxia, hypercarbia SBT as tolerated, proceed with extubation 5/17 Aspiration precautions   CARDIOVASCULAR A:  Secured aneurysm    Hypertensive Emergency  P:  SBP goal < 160  ICU monitoring   RENAL A:   Hypokalemia  P:   NS @ 100 ml/hr, maintain slight hypervolemia Monitor for  cerebral salt wasting  Goal Na 144-150  Monitor I/O, foley needed   GASTROINTESTINAL A:   At Risk Aspiration  P:   NPO  Begin TF pm 5/17 if not extubated  Pepcid for SUP  Place cortrak post extubation for meds / feeding   HEMATOLOGIC A:   Acute Blood Loss Anemia  P:  Trend CBC  Transfuse per ICU guidelines   INFECTIOUS A:   Indwelling EVD  P:   Monitor, post operative care per NSGY   ENDOCRINE A:   Hyperglycemia  P:   Monitor glucose on BMP.  If consistently >180, add SSI     FAMILY  - Updates: No family at bedside on am NP rounding.  Will update on arrival.    Multidisciplinary rounding completed with RN and Dr. Roda Shutters.    Canary Brim, NP-C [facility] Pgr: [phone] or if no answer [phone] 08/16/2017, 9:24 AM

## 2017-08-16 NOTE — Care Management Note (Signed)
Case Management Note  Patient Details  Name: Carla Bauer MRN: [id number] Date of Birth: 31-Jan-1956  Subjective/Objective:  Pt admitted on 08/14/17 s/p Lt temporal lobe ICH with IVH/SAH/SDH duet to LT PCOM aneurysm rupture s/p clipping, EVD and hematoma evacuation.                 Action/Plan: Pt extubated today.  Await therapy recommendations.      Expected Discharge Date:                  Expected Discharge Plan:     In-House Referral:     Discharge planning Services  CM Consult  Post Acute Care Choice:    Choice offered to:     DME Arranged:    DME Agency:     HH Arranged:    HH Agency:     Status of Service:  In process, will continue to follow  If discussed at Long Length of Stay Meetings, dates discussed:    Additional Comments:  Glennon Mac, RN 08/16/2017, 4:59 PM

## 2017-08-16 NOTE — Progress Notes (Signed)
PT Cancellation Note  Patient Details Name: Carla Bauer MRN: [id number] DOB: 09/14/1955   Cancelled Treatment:    Reason Eval/Treat Not Completed: Patient not medically ready.  Pt still on bedrest, sedated on the vent.  Please increase activity orders when appropriate. 08/16/2017  Milbank Bing, PT [phone]  (pager)   Eliseo Gum [name] [name] 08/16/2017, 9:39 AM

## 2017-08-16 NOTE — Progress Notes (Addendum)
STROKE TEAM PROGRESS NOTE   INTERVAL HISTORY RN and CCM PA at bedside. Pt still intubated post crani w/ clip, hematoma evacuation and EVD placement. EVD patent and draining. Pt more awake alert and trying to use left hand to extubate. Put on hand mitten. Still right hemiplegia. On weaning trial doing well. CTA showed new right thalamic and optic radiation infarct, distal PCA territory, however left PCA patent.    Vitals:   08/16/17 0700 08/16/17 0800 08/16/17 0859 08/16/17 0901  BP: 123/76 121/75 (!) 155/73 (!) 151/71  Pulse: 86 84 92 90  Resp: Temp:  (!) 100.5 F (38.1 C)    TempSrc:  Axillary    SpO2: 100% 100% 100% 100%  Weight:      Height:        CBC:  Recent Labs  Lab 08/15/17 0532 08/16/17 0316  WBC 14.2* 19.9*  NEUTROABS 11.8* 16.1*  HGB 9.5* 9.0*  HCT 28.8* 27.3*  MCV 88.9 90.1  PLT 230 220    Basic Metabolic Panel:  Recent Labs  Lab 08/15/17 1533 08/16/17 0316  NA 144 145  K 3.2* 3.0*  CL 114* 114*  CO2 22 23  GLUCOSE 176* 159*  BUN 5* 5*  CREATININE 0.60 0.57  CALCIUM 8.2* 8.6*   Lipid Panel:     Component Value Date/Time   CHOL 120 08/16/2017 0316   TRIG 77 08/16/2017 0316   HDL 53 08/16/2017 0316   CHOLHDL 2.3 08/16/2017 0316   VLDL 15 08/16/2017 0316   LDLCALC 52 08/16/2017 0316   HgbA1c:  Lab Results  Component Value Date   HGBA1C 6.0 (H) 08/15/2017   Urine Drug Screen:     Component Value Date/Time   LABOPIA NONE DETECTED 08/14/2017 0644   COCAINSCRNUR NONE DETECTED 08/14/2017 0644   LABBENZ NONE DETECTED 08/14/2017 0644   AMPHETMU NONE DETECTED 08/14/2017 0644   THCU NONE DETECTED 08/14/2017 0644   LABBARB NONE DETECTED 08/14/2017 0644    Alcohol Level     Component Value Date/Time   ETH <10 08/14/2017 0647    IMAGING I have personally reviewed the radiological images below and agree with the radiology interpretations.  Ct Angio Head W Or Wo Contrast  Result Date: 08/16/2017 CLINICAL DATA:  62 y/o F;  cerebral aneurysm and subarachnoid hemorrhage. Evaluation for cerebral vasospasm. EXAM: CT ANGIOGRAPHY HEAD TECHNIQUE: Multidetector CT imaging of the head was performed using the standard protocol during bolus administration of intravenous contrast. Multiplanar CT image reconstructions and MIPs were obtained to evaluate the vascular anatomy. CONTRAST:  50 cc Isovue 370 COMPARISON:  08/14/2017 CT angiogram of the head and CT of the head. FINDINGS: CT HEAD Brain: Mild interval decrease in volume of intraventricular hemorrhage. Stable subdural collection over the left anterior frontal convexity subjacent to frontal and temporal craniotomy. Near complete resolution of pneumocephalus. Decreased mass effect with 7 mm of left-to-right midline shift, previously 9 mm. Increased hypoattenuation within the left medial temporal lobe extending into the left posterior lentiform nucleus and posterior limb of internal capsule likely representing evolving early subacute infarction. No new acute intracranial hemorrhage or herniation. Right frontal approach ventriculostomy catheter is stable in position. No hydrocephalus. Vascular: As below. Skull: Postsurgical changes related to a left frontal and temporal craniotomy with mildly increased edema, but decreased air in the overlying scalp. Sinuses: Imaged portions are clear. Orbits: No acute finding. CTA HEAD Anterior circulation: No significant stenosis, proximal occlusion, new aneurysm, or vascular malformation. Clips left posterior communicating  artery aneurysm without residual or recurrent aneurysm identified. Posterior circulation: No significant stenosis, proximal occlusion, aneurysm, or vascular malformation. Venous sinuses: As permitted by contrast timing, patent. Anatomic variants: None significant. Delayed phase: No abnormal intracranial enhancement. IMPRESSION: CT head: 1. Increasing hypoattenuation within the left medial temporal lobe extending to left posterior lentiform  nucleus and posterior limb of internal capsule compatible developing early subacute infarction. 2. Mild decrease in volume of intraventricular hemorrhage. 3. Stable subdural collection over the left anterior frontal convexity subjacent to the left frontal and temporal craniotomy. 4. Decreased mass effect with 7 mm of left-to-right midline shift, previously 9 mm. CTA head: 1. No findings of vasospasm. No new large vessel occlusion, aneurysm, or significant stenosis. 2. Left posterior communicating artery aneurysm clip. No residual or recurrent aneurysm identified. Electronically Signed   By: Mitzi Hansen M.D.   On: 08/16/2017 02:20   Ct Angio Head W Or Wo Contrast  Result Date: 08/14/2017 CLINICAL DATA:  Intracranial hemorrhage EXAM: CT ANGIOGRAPHY HEAD AND NECK TECHNIQUE: Multidetector CT imaging of the head and neck was performed using the standard protocol during bolus administration of intravenous contrast. Multiplanar CT image reconstructions and MIPs were obtained to evaluate the vascular anatomy. Carotid stenosis measurements (when applicable) are obtained utilizing NASCET criteria, using the distal internal carotid diameter as the denominator. CONTRAST:  50 mL ISOVUE-370 IOPAMIDOL (ISOVUE-370) INJECTION 76% COMPARISON:  CT head 08/14/2017 FINDINGS: CTA NECK FINDINGS Aortic arch: Normal aortic arch and proximal great vessels. Right carotid system: Right carotid system widely patent. Bifurcation widely patent. Left carotid system: Left carotid system widely patent. Left carotid bifurcation patent. Vertebral arteries: Both vertebral arteries widely patent without stenosis or irregularity. Skeleton: Degenerative changes in the cervical spine with spondylosis and anterolisthesis C3-4. No acute skeletal abnormality. Dental caries. Other neck: Negative for mass or adenopathy in the neck. Upper chest: Visualized lung apices clear. Review of the MIP images confirms the above findings CTA HEAD FINDINGS  Anterior circulation: Right cavernous carotid widely patent. Right anterior and middle cerebral arteries widely patent Left cavernous carotid widely patent. 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 This apex extends into the left temporal hematoma. Hematoma felt to be due to rupture of this aneurysm. Left anterior and middle cerebral arteries widely patent without stenosis or vasospasm. Posterior circulation: Both vertebral arteries patent to the basilar. Basilar widely patent. Right PICA patent. Left PICA not visualized. Dominant left AICA. Superior cerebellar and posterior cerebral arteries patent bilaterally without stenosis. No aneurysm in the posterior circulation. Venous sinuses: Normal venous enhancement Anatomic variants: None Delayed phase: Left temporal hematoma unchanged. Subdural hematoma left frontal lobe and along the tentorium on the left also unchanged. Intraventricular hemorrhage unchanged. Mild hydrocephalus. 11 mm midline shift to the right unchanged. No new hemorrhage since the CT earlier today. 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 Extensive intracranial hemorrhage is stable from earlier this morning. No other aneurysm. No significant atherosclerotic disease. These results were called by telephone at the time of interpretation on 08/14/2017 at 10:37 am to Dr. Conchita Paris , who verbally acknowledged these results. Electronically Signed   By: Marlan Palau M.D.   On: 08/14/2017 10:37   Dg Abd 1 View  Result Date: 08/15/2017 CLINICAL DATA:  Orogastric tube placement EXAM: ABDOMEN - 1 VIEW COMPARISON:  None. FINDINGS: Orogastric tube coiled in the stomach with the tip projecting over the cardia of the stomach. There is no bowel dilatation  to suggest obstruction. There is no evidence of pneumoperitoneum, portal venous gas or pneumatosis. There are no pathologic calcifications along the expected course of the ureters. The osseous structures are unremarkable. IMPRESSION: Orogastric tube coiled in the stomach with the tip projecting over the cardia of the stomach. Electronically Signed   By: Elige Ko   On: 08/15/2017 11:08   Ct Head Wo Contrast  Result Date: 08/14/2017 CLINICAL DATA:  62 y/o  F; 08/14/2017 CT head and CTA head. EXAM: CT HEAD WITHOUT CONTRAST TECHNIQUE: Contiguous axial images were obtained from the base of the skull through the vertex without intravenous contrast. COMPARISON:  08/14/2017 CT head and CTA head. FINDINGS: Brain: Interval left frontal and temporal craniotomy postsurgical changes with air and edema in the overlying scalp. Small volume pneumocephalus overlying the frontal lobes. Interval placement of a left posterior communicating artery aneurysm clip in the left suprasellar cistern. Interval evacuation of hematoma within the left anterior temporal lobe with small residual fluid-filled cavity. Stable volume of intraventricular hemorrhage within the left greater than right lateral ventricles, third ventricle, and fourth ventricle. Stable small volume of hemorrhage overlying the left tentorium cerebelli. Interval placement of a right frontal approach ventriculostomy catheter with tip in the frontal horn of right lateral ventricle. Stable ventricle size. Decreased left-to-right midline shift of 9 mm, previously 11 mm. No downward herniation. Vascular: Interval placement of left suprasellar cistern aneurysm clips. Skull: Postsurgical changes related to left frontal and temporal craniotomy with air and edema in the overlying scalp as well as skin staples. Sinuses/Orbits: No acute finding. Other: None. IMPRESSION: 1. Interval left frontal and temporal craniotomy, left anterior temporal lobe hematoma evacuation, right frontal  approach ventriculostomy catheter, and clipping of left posterior communicating artery aneurysm. 2. Associated postsurgical changes with air and edema in the overlying scalp and small volume of pneumocephalus. 3. Stable residual intraventricular hemorrhage and subdural along the left tentorium cerebelli. 4. No new acute intracranial abnormality. 5. Decreased mass effect with 9 mm left-to-right midline shift, previously 11 mm. Electronically Signed   By: Mitzi Hansen M.D.   On: 08/14/2017 23:20   Ct Angio Neck W Or Wo Contrast  Result Date: 08/14/2017 CLINICAL DATA:  Intracranial hemorrhage EXAM: CT ANGIOGRAPHY HEAD AND NECK TECHNIQUE: Multidetector CT imaging of the head and neck was performed using the standard protocol during bolus administration of intravenous contrast. Multiplanar CT image reconstructions and MIPs were obtained to evaluate the vascular anatomy. Carotid stenosis measurements (when applicable) are obtained utilizing NASCET criteria, using the distal internal carotid diameter as the denominator. CONTRAST:  50 mL ISOVUE-370 IOPAMIDOL (ISOVUE-370) INJECTION 76% COMPARISON:  CT head 08/14/2017 FINDINGS: CTA NECK FINDINGS Aortic arch: Normal aortic arch and proximal great vessels. Right carotid system: Right carotid system widely patent. Bifurcation widely patent. Left carotid system: Left carotid system widely patent. Left carotid bifurcation patent. Vertebral arteries: Both vertebral arteries widely patent without stenosis or irregularity. Skeleton: Degenerative changes in the cervical spine with spondylosis and anterolisthesis C3-4. No acute skeletal abnormality. Dental caries. Other neck: Negative for mass or adenopathy in the neck. Upper chest: Visualized lung apices clear. Review of the MIP images confirms the above findings CTA HEAD FINDINGS Anterior circulation: Right cavernous carotid widely patent. Right anterior and middle cerebral arteries widely patent Left cavernous  carotid widely patent. Left posterior communicating artery aneurysm measures approximately 4 x 6.5  mm and contains an irregular tit at the apex. This apex extends into the left temporal hematoma. Hematoma felt to be due to rupture of this aneurysm. Left anterior and middle cerebral arteries widely patent without stenosis or vasospasm. Posterior circulation: Both vertebral arteries patent to the basilar. Basilar widely patent. Right PICA patent. Left PICA not visualized. Dominant left AICA. Superior cerebellar and posterior cerebral arteries patent bilaterally without stenosis. No aneurysm in the posterior circulation. Venous sinuses: Normal venous enhancement Anatomic variants: None Delayed phase: Left temporal hematoma unchanged. Subdural hematoma left frontal lobe and along the tentorium on the left also unchanged. Intraventricular hemorrhage unchanged. Mild hydrocephalus. 11 mm midline shift to the right unchanged. No new hemorrhage since the CT earlier today. 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 Extensive intracranial hemorrhage is stable from earlier this morning. No other aneurysm. No significant atherosclerotic disease. These results were called by telephone at the time of interpretation on 08/14/2017 at 10:37 am to Dr. Conchita Paris , who verbally acknowledged these results. Electronically Signed   By: Marlan Palau M.D.   On: 08/14/2017 10:37   TTE pending   PHYSICAL EXAM Temp:  [98.2 F (36.8 C)-100.5 F (38.1 C)] 100.5 F (38.1 C) (05/17 0800) Pulse Rate:  [74-98] 90 (05/17 0901) Resp:  [14-20] 17 (05/17 0901) BP: (96-155)/(59-76) 151/71 (05/17 0901) SpO2:  [100 %] 100 % (05/17 0901) Arterial Line BP: (120-160)/(51-69) 149/68 (05/17 0800) FiO2 (%):  [100 %] 100 % (05/17 0901)  General - Well nourished, well  developed, intubated on weaning trial.  Ophthalmologic - Fundi not visualized due to ET tube and left eyelid swollen.  Cardiovascular - Regular rate and rhythm.  Neuro - lethargic, eye closed, however able to open right eye on voice, but not left eye due to eyelid swollen. With forced eye open, PERRL, able to have left gaze but not complete, not able to have right gaze on command. Blinking to visual threat inconsistent bilaterally. Facial symmetry not able to test due to ET tube. Pt was able to follow some simple commands on the left hand, showing fingers and fist, LUE 4/5, LLE 3/5, RUE and RLE flaccid. DTR 1+ and no babinski. Sensation, coordination and gait not tested.   ASSESSMENT/PLAN Ms. SUMAYYA MUHA is a 62 y.o. female with no significant history found having fallen out of the bed with R arm weakness and altered mental status. 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  Left temporal lobe ICH w IVH/SAH/SDH, d/t L PCOM aneurysm rupture s/p clipping, EVD and hematoma evacuation  CT head L mesial temporal hmg 12 cc w/ IVH. Surrounding edema with L to R shift 11mm. 6mm SDH along L convexity. ASPECTS 10.   CTA head & neck LPCom aneurysm c/w rupture into L temporal lobe. Extensive hmg stable from earlier.  S/p crani w/ clipping of L PCom aneurysm, hematoma evacutaion and EVD placement 5/15 (Nundkumar)  CT head repeat interval L frontal and temp crani, L ant temporal lobe hematoma evac w/ R fronal appoach, clip L PCom. Post OR changes w/ air and edema. Stable IVH and SDH. No new abn. 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  PCCM following  Leukocytosis  WBC 12.5->19.9   Tmax 100.5  UA neg   CXR pending  Hyperglycemia  No hx diabetes. Off decadron this am  CBGs monitoring  HgbA1c 6.0  SSI  Blood pressure  controlled  SBP goal < 140  off cardene drip  Labetalol PRN  Other Stroke Risk Factors  No family hx aneurysms  Other Active Problems  Hypokalemia 3.3 -> 3.0, supplement   Hospital day # 2  This patient is critically ill due to ICH, SAH, IVH, PCOM aneurysm, s/p hematoma evacuation, EVD, and aneurysm clipping and at significant risk of neurological worsening, death form re-bleed, hydrocephalus, seizure, cerebral edema and brain herniation. This patient's care requires constant monitoring of vital signs, hemodynamics, respiratory and cardiac monitoring, review of multiple databases, neurological assessment, discussion with family, other specialists and medical decision making of high complexity. I spent 40 minutes of neurocritical care time in the care of this patient.  Marvel Plan, MD PhD Stroke Neurology 08/16/2017 1:52 PM    To contact Stroke Continuity provider, please refer to [email or web address]. After hours, contact General Neurology

## 2017-08-16 NOTE — Progress Notes (Signed)
Transcranial Doppler  Date POD PCO2 HCT BP  MCA ACA PCA OPHT SIPH VERT Basilar  5/17,rs     Right  Left   79  49   -59  *   34  42  32   *  *   *           Right  Left                                            Right  Left                                             Right  Left                                             Right  Left                                            Right  Left                                            Right  Left                                        MCA = Middle Cerebral Artery      OPHT = Opthalmic Artery     BASILAR = Basilar Artery   ACA = Anterior Cerebral Artery     SIPH = Carotid Siphon PCA = Posterior Cerebral Artery   VERT = Verterbral Artery                   Normal MCA = 62+\-12 ACA = 50+\-12 PCA = 42+\-23   5/17 - (*) not insonated due patient position. RDS

## 2017-08-16 NOTE — Progress Notes (Signed)
  Echocardiogram 2D Echocardiogram has been performed.  Carla Bauer 08/16/2017, 11:24 AM

## 2017-08-16 NOTE — Progress Notes (Signed)
Cortrak Tube Team Note:  Consult received to place a Cortrak feeding tube.   A 10 F Cortrak tube was placed in the RIGHT nare and secured with a nasal bridle at 60 cm. Per the Cortrak monitor reading the tube tip is gastric, at the pylorus.   No x-ray is required. RN may begin using tube.   If the tube becomes dislodged please keep the tube and contact the Cortrak team at [email or web address] ([email or web address]) for replacement.  If after hours and replacement cannot be delayed, place a NG tube and confirm placement with an abdominal x-ray.    Earma Reading, MS, RD, LDN Pager: [phone] Weekend/After Hours: [phone]

## 2017-08-16 NOTE — Progress Notes (Signed)
  NEUROSURGERY PROGRESS NOTE   Pt seen and examined. No issues overnight. Extubated today.  EXAM: Temp:  [98.2 F (36.8 C)-100.5 F (38.1 C)] 99.3 F (37.4 C) (05/17 1200) Pulse Rate:  [74-104] 104 (05/17 1200) Resp:  [14-20] 20 (05/17 1200) BP: (96-155)/(59-77) 143/77 (05/17 1200) SpO2:  [98 %-100 %] 100 % (05/17 1200) Arterial Line BP: (120-181)/(51-76) 181/76 (05/17 1200) FiO2 (%):  [100 %] 100 % (05/17 0901) Weight:  [69.8 kg (153 lb 14.1 oz)] 69.8 kg (153 lb 14.1 oz) (05/17 1000) Intake/Output      05/16 0701 - 05/17 0700 05/17 0701 - 05/18 0700   P.O. 3    I.V. (mL/kg) 3295.3 (47.2) 500 (7.2)   NG/GT 90    IV Piggyback 100 50   Total Intake(mL/kg) 3488.3 (50) 550 (7.9)   Urine (mL/kg/hr) 2950 (1.8) 350 (0.8)   Drains 44 14   Blood     Total Output 2994 364   Net +494.3 +186         Arouses to voice Responds to simple questions CN grossly intact Follows commands LUE/LLE W/D RLE to pain, no movement RUE Headwrap c/d/i  LABS: Lab Results  Component Value Date   CREATININE 0.57 08/16/2017   BUN 5 (L) 08/16/2017   NA 145 08/16/2017   K 3.0 (L) 08/16/2017   CL 114 (H) 08/16/2017   CO2 23 08/16/2017   Lab Results  Component Value Date   WBC 19.9 (H) 08/16/2017   HGB 9.0 (L) 08/16/2017   HCT 27.3 (L) 08/16/2017   MCV 90.1 08/16/2017   PLT 220 08/16/2017    IMAGING: CTA reviewed. No large vessel occlusion. No residual aneurysm. PLIC hypodensity likely stroke  IMPRESSION: - 62 y.o. female SAH d#3 POD#2 s/p clipping left pcom aneurysm. 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 Can allow SBP up to if needed - Nimotop, TCD monitoring - Cont EVD @ 10 - Can get OOB with PT/OT

## 2017-08-16 NOTE — Procedures (Signed)
Extubation Procedure Note  Patient Details:   Name: Carla Bauer DOB: 09-14-1955 MRN: [id number]   Airway Documentation:    Vent end date: 08/16/17 Vent end time: 1155   Evaluation  O2 sats: stable throughout Complications: No apparent complications Patient did tolerate procedure well. Bilateral Breath Sounds: Diminished, Clear   Yes- pt has clear ability to speak.  Pt extubated per MD order to 2L [place]. VS stable and no signs of distress or stridor noted.   Julieanne Manson 08/16/2017, 11:58 AM

## 2017-08-16 NOTE — Progress Notes (Signed)
eLink Physician-Brief Progress Note Patient Name: Carla Bauer DOB: [birth year] MRN: [id number]   Date of Service  08/16/2017  HPI/Events of Note  hypertension  eICU Interventions  Prn labetalol     Intervention Category Major Interventions: Hypertension - evaluation and management  Max Fickle 08/16/2017, 2:05 AM

## 2017-08-17 ENCOUNTER — Inpatient Hospital Stay (HOSPITAL_COMMUNITY): Payer: Medicaid Other

## 2017-08-17 DIAGNOSIS — J9601 Acute respiratory failure with hypoxia: Secondary | ICD-10-CM

## 2017-08-17 DIAGNOSIS — I63332 Cerebral infarction due to thrombosis of left posterior cerebral artery: Secondary | ICD-10-CM

## 2017-08-17 DIAGNOSIS — I6032 Nontraumatic subarachnoid hemorrhage from left posterior communicating artery: Principal | ICD-10-CM

## 2017-08-17 DIAGNOSIS — J988 Other specified respiratory disorders: Secondary | ICD-10-CM

## 2017-08-17 LAB — CBC WITH DIFFERENTIAL/PLATELET
ABS IMMATURE GRANULOCYTES: 0.2 10*3/uL — AB (ref 0.0–0.1)
BASOS PCT: 0 %
Basophils Absolute: 0.1 10*3/uL (ref 0.0–0.1)
EOS PCT: 0 %
Eosinophils Absolute: 0 10*3/uL (ref 0.0–0.7)
HCT: 27.1 % — ABNORMAL LOW (ref 36.0–46.0)
Hemoglobin: 9.2 g/dL — ABNORMAL LOW (ref 12.0–15.0)
Immature Granulocytes: 1 %
LYMPHS PCT: 16 %
Lymphs Abs: 3 10*3/uL (ref 0.7–4.0)
MCH: 29.8 pg (ref 26.0–34.0)
MCHC: 33.9 g/dL (ref 30.0–36.0)
MCV: 87.7 fL (ref 78.0–100.0)
MONO ABS: 1.1 10*3/uL — AB (ref 0.1–1.0)
MONOS PCT: 6 %
NEUTROS ABS: 15 10*3/uL — AB (ref 1.7–7.7)
Neutrophils Relative %: 77 %
Platelets: 227 10*3/uL (ref 150–400)
RBC: 3.09 MIL/uL — ABNORMAL LOW (ref 3.87–5.11)
RDW: 13.6 % (ref 11.5–15.5)
WBC: 19.3 10*3/uL — ABNORMAL HIGH (ref 4.0–10.5)

## 2017-08-17 LAB — BASIC METABOLIC PANEL
ANION GAP: 9 (ref 5–15)
BUN: 5 mg/dL — ABNORMAL LOW (ref 6–20)
CO2: 24 mmol/L (ref 22–32)
Calcium: 8.9 mg/dL (ref 8.9–10.3)
Chloride: 113 mmol/L — ABNORMAL HIGH (ref 101–111)
Creatinine, Ser: 0.55 mg/dL (ref 0.44–1.00)
Glucose, Bld: 160 mg/dL — ABNORMAL HIGH (ref 65–99)
POTASSIUM: 2.8 mmol/L — AB (ref 3.5–5.1)
Sodium: 146 mmol/L — ABNORMAL HIGH (ref 135–145)

## 2017-08-17 LAB — GLUCOSE, CAPILLARY
GLUCOSE-CAPILLARY: 140 mg/dL — AB (ref 65–99)
GLUCOSE-CAPILLARY: 142 mg/dL — AB (ref 65–99)
Glucose-Capillary: 124 mg/dL — ABNORMAL HIGH (ref 65–99)
Glucose-Capillary: 131 mg/dL — ABNORMAL HIGH (ref 65–99)
Glucose-Capillary: 136 mg/dL — ABNORMAL HIGH (ref 65–99)
Glucose-Capillary: 148 mg/dL — ABNORMAL HIGH (ref 65–99)

## 2017-08-17 LAB — C DIFFICILE QUICK SCREEN W PCR REFLEX
C DIFFICLE (CDIFF) ANTIGEN: NEGATIVE
C Diff interpretation: NOT DETECTED
C Diff toxin: NEGATIVE

## 2017-08-17 MED ORDER — ACETYLCYSTEINE 20 % IN SOLN
3.0000 mL | Freq: Three times a day (TID) | RESPIRATORY_TRACT | Status: DC
  Administered 2017-08-17: 3 mL via RESPIRATORY_TRACT
  Filled 2017-08-17: qty 4

## 2017-08-17 MED ORDER — CHLORHEXIDINE GLUCONATE 0.12 % MT SOLN
OROMUCOSAL | Status: AC
  Filled 2017-08-17: qty 15

## 2017-08-17 MED ORDER — POTASSIUM CHLORIDE 20 MEQ/15ML (10%) PO SOLN
40.0000 meq | Freq: Four times a day (QID) | ORAL | Status: AC
  Administered 2017-08-17 (×3): 40 meq
  Filled 2017-08-17 (×3): qty 30

## 2017-08-17 MED ORDER — ALBUTEROL SULFATE (2.5 MG/3ML) 0.083% IN NEBU
2.5000 mg | INHALATION_SOLUTION | Freq: Four times a day (QID) | RESPIRATORY_TRACT | Status: DC | PRN
  Administered 2017-08-17: 2.5 mg via RESPIRATORY_TRACT
  Filled 2017-08-17: qty 3

## 2017-08-17 NOTE — Progress Notes (Signed)
PULMONARY / CRITICAL CARE MEDICINE   Name: Carla Bauer MRN: [id number] DOB: [birth year]    ADMISSION DATE:  08/14/2017 CONSULTATION DATE:  08/14/2017  REFERRING MD:  Patric Dykes - CNSA.   CHIEF COMPLAINT:  Aneurysmal subarachnoid hemorrhage.   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  Remained on vent post op.  SUBJECTIVE:   Extubated 5/17.  VITAL SIGNS: BP 122/68 (BP Location: Right Arm)   Pulse 85   Temp 99.1 F (37.3 C) (Axillary)   Resp 20   Ht  (1.549 m)   Wt 149 lb 4 oz (67.7 kg)   SpO2 96%   BMI 28.20 kg/m   INTAKE / OUTPUT: I/O last 3 completed shifts: In: 4470.8 [I.V.:3600; NG/GT:770.8; IV Piggyback:100] Out: 5066 [Urine:4950; Drains:116]  PHYSICAL EXAMINATION:  General - alert Eyes - pupils reactive ENT - no stridor Cardiac - regular, no murmur Chest - no wheeze, rales Abd - soft, non tender Ext - no edema Skin - no rashes Neuro - weak on Rt side  LABS:  BMET Recent Labs  Lab 08/15/17 1533 08/16/17 0316 08/17/17 0454  NA 144 145 146*  K 3.2* 3.0* 2.8*  CL 114* 114* 113*  CO2 BUN 5* 5* 5*  CREATININE 0.60 0.57 0.55  GLUCOSE 176* 159* 160*    Electrolytes Recent Labs  Lab 08/15/17 1533 08/16/17 0316 08/17/17 0454  CALCIUM 8.2* 8.6* 8.9  MG  --  2.0  --     CBC Recent Labs  Lab 08/15/17 0532 08/16/17 0316 08/17/17 0454  WBC 14.2* 19.9* 19.3*  HGB 9.5* 9.0* 9.2*  HCT 28.8* 27.3* 27.1*  PLT 230 220 227    Coag's Recent Labs  Lab 08/14/17 0648  APTT 25  INR 1.01    Sepsis Markers No results for input(s): LATICACIDVEN, PROCALCITON, O2SATVEN in the last 168 hours.  ABG No results for input(s): PHART, PCO2ART, PO2ART in the last 168 hours.  Liver Enzymes Recent Labs  Lab 08/14/17 0648  AST 22  ALT 16  ALKPHOS 66  BILITOT 0.6  ALBUMIN 3.9    Cardiac  Enzymes No results for input(s): TROPONINI, PROBNP in the last 168 hours.  Glucose Recent Labs  Lab 08/16/17 1138 08/16/17 1527 08/16/17 2007 08/16/17 2331 08/17/17 0409 08/17/17 0730  GLUCAP 143* 135* 152* 155* 140* 131*    Imaging Dg Chest Port 1 View  Result Date: 08/17/2017 CLINICAL DATA:  Acute respiratory failure with hypoxia EXAM: PORTABLE CHEST 1 VIEW COMPARISON:  None. FINDINGS: Nasoenteric feeding tube at least reaches the stomach. Extensive artifact from EKG leads. Linear opacity at the right base favoring atelectasis. There is low volumes with interstitial crowding. No Kerley lines, effusion, or pneumothorax. Borderline cardiomegaly, accentuated by technique. IMPRESSION: Low volumes with mild atelectasis on the right. Electronically Signed   By: Marnee Spring M.D.   On: 08/17/2017 08:18     STUDIES:  CT Head 5/17 >> increasing hypoattenuation within the left medial temporal lobe extending to left posterior lentiform nucleus & posterior limb of internal capsule consistent with developing early subacute infarction.  Mild decrease in intraventricular hemorrhage, decreased mass effect at 7mm, decreased air CTA Head 5/17 >> no findings of vasospasm, no new large vessel occlusion, left posterior communicating artery aneurysm clip Echo 5/17 >> EF 65 to 70%, grade 1 DD, mild MR, mod TR,  PAS 48 mmHg  CULTURES:  ANTIBIOTICS:  SIGNIFICANT EVENTS: 5/15 Clipping PCOM aneurysm and clot evacuation.  2 units PRBC's intra-op  LINES/TUBES: EVD 5/15 >>  A-Line 5/15 >>  ETT 5/15 >> 5/17  DISCUSSION: No issues after extubation 5/17.  Neurosurgery planning for f/u CT head 5/19.  ASSESSMENT / PLAN:  Compromised airway. 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 - post op care, EVD per neurosurgery - f/u CT head 5/19 - continue nimodipine  Hypertension. - goal SBP < 160  Hypokalemia. - replace  as needed  Hyperglycemia. - SSI  Anemia of critical illness. - f/u CBC  DVT prophylaxis - SCD SUP - not indicated Nutrition - tube feeds Goals of care - full code  PCCM will f/u on 08/19/17 >> call if help needed sooner  Updated family at bedside  Coralyn Helling, MD [facility] Pulmonary/Critical Care 08/17/2017, 9:09 AM

## 2017-08-17 NOTE — Evaluation (Signed)
Clinical/Bedside Swallow Evaluation Patient Details  Name: Carla Bauer MRN: [id number] Date of Birth: [birth year]  Today's Date: 08/17/2017 Time: SLP Start Time (ACUTE ONLY): 1110 SLP Stop Time (ACUTE ONLY): 1125 SLP Time Calculation (min) (ACUTE ONLY): 15 min  Past Medical History: History reviewed. No pertinent past medical history. Past Surgical History:  Past Surgical History:  Procedure Laterality Date  . CESAREAN SECTION    . CRANIOTOMY Left 08/14/2017   Procedure: 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;  Surgeon: Lisbeth Renshaw, MD;  Location: [facility] OR;  Service: Neurosurgery;  Laterality: Left;   HPI:  Carla Bauer is a 62 y.o. female with no significant history found having fallen out of the bed with R arm weakness and altered mental status. 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 Intubated ETT 5/15- 5/17.   Assessment / Plan / Recommendation Clinical Impression   Patient presents with severe risk for aspiration, with what appears to be multifactorial dysphagia s/p ICH and 2 day intubation. Cough is weak and congested. Pt with cognitive linguistic impairments including slow processing; suspect language impairments although difficult to assess this date given pt's ability to maintain attention and extremely low vocal intensity. Right sided facial droop, weakness noted. Single ice chip administered after oral care; there is prolonged oral transit, and pt requires cues to initiate swallow, which appears delayed and is followed by immediate, wet coughing. Pt's son and daughter-in-law present; education completed with pt and family re: risks for aspiration and swallow physiology. Anticipate pt will need instrumental assessment (MBS or FEES would be appropriate) when clinically indicated. Cortrak in place; recommend pt remain NPO. Will follow for readiness for POs/instrumental  assessment. Pt's family in agreement.   SLP Visit Diagnosis: Dysphagia, oropharyngeal phase (R13.12)    Aspiration Risk  Severe aspiration risk    Diet Recommendation NPO   Medication Administration: Via alternative means    Other  Recommendations Oral Care Recommendations: Oral care QID   Follow up Recommendations Other (comment)(tbd)      Frequency and Duration min 2x/week  2 weeks       Prognosis Prognosis for Safe Diet Advancement: Good      Swallow Study   General Date of Onset: 08/14/17 HPI: Carla Bauer is a 62 y.o. female with no significant history found having fallen out of the bed with R arm weakness and altered mental status. 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 Intubated ETT 5/15- 5/17. Type of Study: Bedside Swallow Evaluation Previous Swallow Assessment: none in chart Diet Prior to this Study: NPO;NG Tube Temperature Spikes Noted: Yes(100.3) Respiratory Status: Room air History of Recent Intubation: No Behavior/Cognition: Lethargic/Drowsy;Distractible;Requires cueing Oral Cavity Assessment: Within Functional Limits Oral Care Completed by SLP: Yes Oral Cavity - Dentition: Adequate natural dentition Vision: Impaired for self-feeding Self-Feeding Abilities: Total assist Patient Positioning: Upright in bed Baseline Vocal Quality: Low vocal intensity Volitional Cough: Cognitively unable to elicit(reflexive coughing weak and congested) Volitional Swallow: Unable to elicit    Oral/Motor/Sensory Function Overall Oral Motor/Sensory Function: Moderate impairment Facial ROM: Reduced right;Suspected CN VII (facial) dysfunction Facial Symmetry: Abnormal symmetry right;Suspected CN VII (facial) dysfunction Facial Strength: Reduced right;Suspected CN VII (facial) dysfunction Lingual Symmetry: Within Functional Limits   Ice Chips Ice chips: Impaired Oral Phase Impairments: Poor  awareness of bolus Oral Phase Functional Implications: Oral holding;Prolonged oral transit Pharyngeal Phase  Impairments: Suspected delayed Swallow;Cough - Immediate   Thin Liquid Thin Liquid: Not tested    Nectar Thick Nectar Thick Liquid: Not tested   Honey Thick Honey Thick Liquid: Not tested   Puree Puree: Not tested   Solid   GO   Solid: Not tested       Rondel Baton, MS, CCC-SLP Speech-Language Pathologist [phone]  Arlana Lindau 08/17/2017,11:47 AM

## 2017-08-17 NOTE — Progress Notes (Signed)
No changes over the night, patient following commands, not moving right side but to pain. Patient has congested cough, but will not cough up secretions all the way or allow to be suctioned in mouth. Lungs sound rhonchus.  Removed foley at 0500, placed pure wick. Removed arterial line after drawing labs.

## 2017-08-17 NOTE — Progress Notes (Signed)
STROKE TEAM PROGRESS NOTE   INTERVAL HISTORY RN at bedside and two family members sleeping in roomat bedside. Pt extubated yesterday and tolerating well. Today she has secretions but difficult to cough out. Lethargic but able to follow most of the simple commands. EVD drain patent. CT head pending.     Vitals:   08/17/17 0500 08/17/17 0600 08/17/17 0700 08/17/17 0800  BP: 125/72 (!) 141/81 (!) 124/59 122/68  Pulse: 86 99 92 85  Resp: (!) 23 (!) 21 (!) 22 20  Temp:      TempSrc:      SpO2: 98% 97% 95% 96%  Weight: 149 lb 4 oz (67.7 kg)     Height:        CBC:  Recent Labs  Lab 08/16/17 0316 08/17/17 0454  WBC 19.9* 19.3*  NEUTROABS 16.1* 15.0*  HGB 9.0* 9.2*  HCT 27.3* 27.1*  MCV 90.1 87.7  PLT 220 227    Basic Metabolic Panel:  Recent Labs  Lab 08/16/17 0316 08/17/17 0454  NA 145 146*  K 3.0* 2.8*  CL 114* 113*  CO2 23 24  GLUCOSE 159* 160*  BUN 5* 5*  CREATININE 0.57 0.55  CALCIUM 8.6* 8.9  MG 2.0  --    Lipid Panel:     Component Value Date/Time   CHOL 120 08/16/2017 0316   TRIG 77 08/16/2017 0316   HDL 53 08/16/2017 0316   CHOLHDL 2.3 08/16/2017 0316   VLDL 15 08/16/2017 0316   LDLCALC 52 08/16/2017 0316   HgbA1c:  Lab Results  Component Value Date   HGBA1C 6.0 (H) 08/15/2017   Urine Drug Screen:     Component Value Date/Time   LABOPIA NONE DETECTED 08/14/2017 0644   COCAINSCRNUR NONE DETECTED 08/14/2017 0644   LABBENZ NONE DETECTED 08/14/2017 0644   AMPHETMU NONE DETECTED 08/14/2017 0644   THCU NONE DETECTED 08/14/2017 0644   LABBARB NONE DETECTED 08/14/2017 0644    Alcohol Level     Component Value Date/Time   ETH <10 08/14/2017 0647    IMAGING I have personally reviewed the radiological images below and agree with the radiology interpretations.  Ct Angio Head W Or Wo Contrast  Result Date: 08/16/2017 CLINICAL DATA:  62 y/o F; cerebral aneurysm and subarachnoid hemorrhage. Evaluation for cerebral vasospasm. EXAM: CT ANGIOGRAPHY  HEAD TECHNIQUE: Multidetector CT imaging of the head was performed using the standard protocol during bolus administration of intravenous contrast. Multiplanar CT image reconstructions and MIPs were obtained to evaluate the vascular anatomy. CONTRAST:  50 cc Isovue 370 COMPARISON:  08/14/2017 CT angiogram of the head and CT of the head. FINDINGS: CT HEAD Brain: Mild interval decrease in volume of intraventricular hemorrhage. Stable subdural collection over the left anterior frontal convexity subjacent to frontal and temporal craniotomy. Near complete resolution of pneumocephalus. Decreased mass effect with 7 mm of left-to-right midline shift, previously 9 mm. Increased hypoattenuation within the left medial temporal lobe extending into the left posterior lentiform nucleus and posterior limb of internal capsule likely representing evolving early subacute infarction. No new acute intracranial hemorrhage or herniation. Right frontal approach ventriculostomy catheter is stable in position. No hydrocephalus. Vascular: As below. Skull: Postsurgical changes related to a left frontal and temporal craniotomy with mildly increased edema, but decreased air in the overlying scalp. Sinuses: Imaged portions are clear. Orbits: No acute finding. CTA HEAD Anterior circulation: No significant stenosis, proximal occlusion, new aneurysm, or vascular malformation. Clips left posterior communicating artery aneurysm without residual or recurrent aneurysm identified. Posterior  circulation: No significant stenosis, proximal occlusion, aneurysm, or vascular malformation. Venous sinuses: As permitted by contrast timing, patent. Anatomic variants: None significant. Delayed phase: No abnormal intracranial enhancement. IMPRESSION: CT head: 1. Increasing hypoattenuation within the left medial temporal lobe extending to left posterior lentiform nucleus and posterior limb of internal capsule compatible developing early subacute infarction. 2. Mild  decrease in volume of intraventricular hemorrhage. 3. Stable subdural collection over the left anterior frontal convexity subjacent to the left frontal and temporal craniotomy. 4. Decreased mass effect with 7 mm of left-to-right midline shift, previously 9 mm. CTA head: 1. No findings of vasospasm. No new large vessel occlusion, aneurysm, or significant stenosis. 2. Left posterior communicating artery aneurysm clip. No residual or recurrent aneurysm identified. Electronically Signed   By: Mitzi Hansen M.D.   On: 08/16/2017 02:20   Ct Angio Head W Or Wo Contrast  Result Date: 08/14/2017 CLINICAL DATA:  Intracranial hemorrhage EXAM: CT ANGIOGRAPHY HEAD AND NECK TECHNIQUE: Multidetector CT imaging of the head and neck was performed using the standard protocol during bolus administration of intravenous contrast. Multiplanar CT image reconstructions and MIPs were obtained to evaluate the vascular anatomy. Carotid stenosis measurements (when applicable) are obtained utilizing NASCET criteria, using the distal internal carotid diameter as the denominator. CONTRAST:  50 mL ISOVUE-370 IOPAMIDOL (ISOVUE-370) INJECTION 76% COMPARISON:  CT head 08/14/2017 FINDINGS: CTA NECK FINDINGS Aortic arch: Normal aortic arch and proximal great vessels. Right carotid system: Right carotid system widely patent. Bifurcation widely patent. Left carotid system: Left carotid system widely patent. Left carotid bifurcation patent. Vertebral arteries: Both vertebral arteries widely patent without stenosis or irregularity. Skeleton: Degenerative changes in the cervical spine with spondylosis and anterolisthesis C3-4. No acute skeletal abnormality. Dental caries. Other neck: Negative for mass or adenopathy in the neck. Upper chest: Visualized lung apices clear. Review of the MIP images confirms the above findings CTA HEAD FINDINGS Anterior circulation: Right cavernous carotid widely patent. Right anterior and middle cerebral arteries  widely patent Left cavernous carotid widely patent. 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 This apex extends into the left temporal hematoma. Hematoma felt to be due to rupture of this aneurysm. Left anterior and middle cerebral arteries widely patent without stenosis or vasospasm. Posterior circulation: Both vertebral arteries patent to the basilar. Basilar widely patent. Right PICA patent. Left PICA not visualized. Dominant left AICA. Superior cerebellar and posterior cerebral arteries patent bilaterally without stenosis. No aneurysm in the posterior circulation. Venous sinuses: Normal venous enhancement Anatomic variants: None Delayed phase: Left temporal hematoma unchanged. Subdural hematoma left frontal lobe and along the tentorium on the left also unchanged. Intraventricular hemorrhage unchanged. Mild hydrocephalus. 11 mm midline shift to the right unchanged. No new hemorrhage since the CT earlier today. 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 Extensive intracranial hemorrhage is stable from earlier this morning. No other aneurysm. No significant atherosclerotic disease. These results were called by telephone at the time of interpretation on 08/14/2017 at 10:37 am to Dr. Conchita Paris , who verbally acknowledged these results. Electronically Signed   By: Marlan Palau M.D.   On: 08/14/2017 10:37   Ct Head Wo Contrast  Result Date: 08/14/2017 CLINICAL DATA:  62 y/o  F; 08/14/2017 CT head and CTA head. EXAM: CT HEAD WITHOUT CONTRAST TECHNIQUE: Contiguous axial  images were obtained from the base of the skull through the vertex without intravenous contrast. COMPARISON:  08/14/2017 CT head and CTA head. FINDINGS: Brain: Interval left frontal and temporal  craniotomy postsurgical changes with air and edema in the overlying scalp. Small volume pneumocephalus overlying the frontal lobes. Interval placement of a left posterior communicating artery aneurysm clip in the left suprasellar cistern. Interval evacuation of hematoma within the left anterior temporal lobe with small residual fluid-filled cavity. Stable volume of intraventricular hemorrhage within the left greater than right lateral ventricles, third ventricle, and fourth ventricle. Stable small volume of hemorrhage overlying the left tentorium cerebelli. Interval placement of a right frontal approach ventriculostomy catheter with tip in the frontal horn of right lateral ventricle. Stable ventricle size. Decreased left-to-right midline shift of 9 mm, previously 11 mm. No downward herniation. Vascular: Interval placement of left suprasellar cistern aneurysm clips. Skull: Postsurgical changes related to left frontal and temporal craniotomy with air and edema in the overlying scalp as well as skin staples. Sinuses/Orbits: No acute finding. Other: None. IMPRESSION: 1. Interval left frontal and temporal craniotomy, left anterior temporal lobe hematoma evacuation, right frontal approach ventriculostomy catheter, and clipping of left posterior communicating artery aneurysm. 2. Associated postsurgical changes with air and edema in the overlying scalp and small volume of pneumocephalus. 3. Stable residual intraventricular hemorrhage and subdural along the left tentorium cerebelli. 4. No new acute intracranial abnormality. 5. Decreased mass effect with 9 mm left-to-right midline shift, previously 11 mm. Electronically Signed   By: Mitzi Hansen M.D.   On: 08/14/2017 23:20   Ct Angio Neck W Or Wo Contrast  Result Date: 08/14/2017 CLINICAL DATA:  Intracranial hemorrhage EXAM: CT ANGIOGRAPHY HEAD AND NECK TECHNIQUE: Multidetector CT imaging of the head and neck was performed using the standard protocol during  bolus administration of intravenous contrast. Multiplanar CT image reconstructions and MIPs were obtained to evaluate the vascular anatomy. Carotid stenosis measurements (when applicable) are obtained utilizing NASCET criteria, using the distal internal carotid diameter as the denominator. CONTRAST:  50 mL ISOVUE-370 IOPAMIDOL (ISOVUE-370) INJECTION 76% COMPARISON:  CT head 08/14/2017 FINDINGS: CTA NECK FINDINGS Aortic arch: Normal aortic arch and proximal great vessels. Right carotid system: Right carotid system widely patent. Bifurcation widely patent. Left carotid system: Left carotid system widely patent. Left carotid bifurcation patent. Vertebral arteries: Both vertebral arteries widely patent without stenosis or irregularity. Skeleton: Degenerative changes in the cervical spine with spondylosis and anterolisthesis C3-4. No acute skeletal abnormality. Dental caries. Other neck: Negative for mass or adenopathy in the neck. Upper chest: Visualized lung apices clear. Review of the MIP images confirms the above findings CTA HEAD FINDINGS Anterior circulation: Right cavernous carotid widely patent. Right anterior and middle cerebral arteries widely patent Left cavernous carotid widely patent. 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 This apex extends into the left temporal hematoma. Hematoma felt to be due to rupture of this aneurysm. Left anterior and middle cerebral arteries widely patent without stenosis or vasospasm. Posterior circulation: Both vertebral arteries patent to the basilar. Basilar widely patent. Right PICA patent. Left PICA not visualized. Dominant left AICA. Superior cerebellar and posterior cerebral arteries patent bilaterally without stenosis. No aneurysm in the posterior circulation. Venous sinuses: Normal venous enhancement Anatomic variants: None Delayed phase: Left temporal hematoma unchanged. Subdural hematoma  left frontal lobe and along the tentorium on the left also unchanged. Intraventricular hemorrhage unchanged. Mild hydrocephalus. 11 mm midline shift to  the right unchanged. No new hemorrhage since the CT earlier today. 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 Extensive intracranial hemorrhage is stable from earlier this morning. No other aneurysm. No significant atherosclerotic disease. These results were called by telephone at the time of interpretation on 08/14/2017 at 10:37 am to Dr. Conchita Paris , who verbally acknowledged these results. Electronically Signed   By: Marlan Palau M.D.   On: 08/14/2017 10:37   Dg Chest Port 1 View  Result Date: 08/17/2017 CLINICAL DATA:  Acute respiratory failure with hypoxia EXAM: PORTABLE CHEST 1 VIEW COMPARISON:  None. FINDINGS: Nasoenteric feeding tube at least reaches the stomach. Extensive artifact from EKG leads. Linear opacity at the right base favoring atelectasis. There is low volumes with interstitial crowding. No Kerley lines, effusion, or pneumothorax. Borderline cardiomegaly, accentuated by technique. IMPRESSION: Low volumes with mild atelectasis on the right. Electronically Signed   By: Marnee Spring M.D.   On: 08/17/2017 08:18   TTE  - Left ventricle: The cavity size was normal. Systolic function was   vigorous. The estimated ejection fraction was in the range of 65%   to 70%. Wall motion was normal; there were no regional wall   motion abnormalities. Doppler parameters are consistent with   abnormal left ventricular relaxation (grade 1 diastolic   dysfunction). There was no evidence of elevated ventricular   filling pressure by Doppler parameters. - Aortic valve: Trileaflet; normal thickness leaflets. Valve area   (VTI): 2.01 cm^2. Valve area (Vmax): 2.18 cm^2. Valve area    (Vmean): 2.06 cm^2. - Aortic root: The aortic root was normal in size. - Mitral valve: There was mild regurgitation. - Right ventricle: Systolic function was normal. - Right atrium: The atrium was normal in size. - Tricuspid valve: There was moderate regurgitation. - Pulmonic valve: There was no regurgitation. - Pulmonary arteries: Systolic pressure was moderately increased.   PA peak pressure: 48 mm Hg (S). - Inferior vena cava: The vessel was normal in size. - Pericardium, extracardiac: There was no pericardial effusion.  TCD 08/16/17 - no vasospasm  TCD pending  CT head pending    PHYSICAL EXAM Temp:  [99 F (37.2 C)-100.3 F (37.9 C)] 99.1 F (37.3 C) (05/18 0400) Pulse Rate:  [81-117] 85 (05/18 0800) Resp:  [17-27] 20 (05/18 0800) BP: (114-153)/(54-109) 122/68 (05/18 0800) SpO2:  [95 %-100 %] 96 % (05/18 0800) Arterial Line BP: (137-201)/(55-77) 155/67 (05/18 0500) Weight:  [149 lb 4 oz (67.7 kg)-153 lb 14.1 oz (69.8 kg)] 149 lb 4 oz (67.7 kg) (05/18 0500)  General - Well nourished, well developed, lethargic  Ophthalmologic - Fundi not visualized due to noncooperation.  Cardiovascular - Regular rate and rhythm.  Neuro - lethargic, eye closed, however able to open right eye on voice, but slightly open left eye due to eyelid swollen. With forced eye open, PERRL, left gaze preference but not able to cross midline. Blinking to visual threat to the left but not to the right. Right facial droop and tongue midline protrusion. Pt was able to follow  simple peripheral commands on the left hand, showing fingers and fist, but not wiggle toes on command, LUE 4/5, LLE 3/5, RUE 1/5 on pain, and RLE 2/5 on pain stimulation. DTR 1+ and no babinski. Sensation, coordination not cooperative and gait not tested.   ASSESSMENT/PLAN Ms. SAHVANNA MCMANIGAL is a  62 y.o. female with no significant history found having fallen out of the bed with R arm weakness and altered mental status. CT showed  left temporal ICH with IVH/SAH. 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  Left temporal lobe ICH w IVH/SAH/SDH, d/t L PCOM aneurysm rupture s/p clipping, EVD and hematoma evacuation  CT head L mesial temporal hmg 12 cc w/ IVH. Surrounding edema with L to R shift 11mm. 6mm SDH along L convexity. ASPECTS 10.   CTA head & neck LPCom aneurysm c/w rupture into L temporal lobe. Extensive hmg stable from earlier.  S/p crani w/ clipping of L PCom aneurysm, hematoma evacutaion and EVD placement 5/15 (Nundkumar)  CT head repeat interval L frontal and temp crani, L ant temporal lobe hematoma evac w/ R fronal appoach, clip L PCom. Post OR changes w/ air and edema. Stable IVH and SDH. No new abn. Decreased mass effect to 9mm L to R  CTA repeat showed left PCOM s/p clip and left temporal ICH evacuation, however, new left thalamus and left optic radiation infarcts  TCD MWF - 08/16/17 no vasospasm. 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/PCA/AchA manipulation with PCOM aneurysm clipping  Resultant - right hemiplegia  CTA repeat - patent left PCA and MCA  Consider MRI once off EVD drain and able to go to MRI  May consider ASA pending CT repeat  PT/OT - pending  LDL 52   A1C 6.0  Repeat CT pending  Respiratory distress  Intubated for crani  extubate 08/16/17, tolerating well   CXR mild right atelectasis  Will order mucomyst nebs  PCCM following  Leukocytosis  WBC 12.5->19.9->19.3  Tmax 100.5  UA neg   CXR mild right atelectasis  Blood pressure  controlled  SBP goal < 140  off cardene drip  Labetalol PRN  Other Stroke Risk Factors  No family hx aneurysms  Other Active  Problems  Hypokalemia 3.3 -> 3.0->2.8, supplement   Hospital day # 3  This patient is critically ill due to ICH, SAH, IVH, PCOM aneurysm, s/p hematoma evacuation, EVD, and aneurysm clipping and at significant risk of neurological worsening, death form re-bleed, hydrocephalus, seizure, cerebral edema and brain herniation. This patient's care requires constant monitoring of vital signs, hemodynamics, respiratory and cardiac monitoring, review of multiple databases, neurological assessment, discussion with family, other specialists and medical decision making of high complexity. I spent 35 minutes of neurocritical care time in the care of this patient.  Marvel Plan, MD PhD Stroke Neurology 08/17/2017 9:12 AM    To contact Stroke Continuity provider, please refer to [email or web address]. After hours, contact General Neurology

## 2017-08-17 NOTE — Progress Notes (Signed)
Subjective: Patient reports patient somnolent but arousable does follow commands on the left right hemiparesis  Objective: Vital signs in last 24 hours: Temp:  [99 F (37.2 C)-100.3 F (37.9 C)] 99.1 F (37.3 C) (05/18 0400) Pulse Rate:  [81-117] 85 (05/18 0800) Resp:  [17-27] 20 (05/18 0800) BP: (114-153)/(54-109) 122/68 (05/18 0800) SpO2:  [95 %-100 %] 96 % (05/18 0800) Arterial Line BP: (137-201)/(55-77) 155/67 (05/18 0500) Weight:  [67.7 kg (149 lb 4 oz)-69.8 kg (153 lb 14.1 oz)] 67.7 kg (149 lb 4 oz) (05/18 0500)  Intake/Output from previous day: 05/17 0701 - 05/18 0700 In: 3220.8 [I.V.:2400; NG/GT:770.8; IV Piggyback:50] Out: 3050 [Urine:2975; Drains:75] Intake/Output this shift: Total I/O In: 150 [I.V.:100; NG/GT:50] Out: 9 [Drains:9]  as above  Lab Results: Recent Labs    08/16/17 0316 08/17/17 0454  WBC 19.9* 19.3*  HGB 9.0* 9.2*  HCT 27.3* 27.1*  PLT 220 227   BMET Recent Labs    08/16/17 0316 08/17/17 0454  NA 145 146*  K 3.0* 2.8*  CL 114* 113*  CO2 23 24  GLUCOSE 159* 160*  BUN 5* 5*  CREATININE 0.57 0.55  CALCIUM 8.6* 8.9    Studies/Results: Ct Angio Head W Or Wo Contrast  Result Date: 08/16/2017 CLINICAL DATA:  62 y/o F; cerebral aneurysm and subarachnoid hemorrhage. Evaluation for cerebral vasospasm. EXAM: CT ANGIOGRAPHY HEAD TECHNIQUE: Multidetector CT imaging of the head was performed using the standard protocol during bolus administration of intravenous contrast. Multiplanar CT image reconstructions and MIPs were obtained to evaluate the vascular anatomy. CONTRAST:  50 cc Isovue 370 COMPARISON:  08/14/2017 CT angiogram of the head and CT of the head. FINDINGS: CT HEAD Brain: Mild interval decrease in volume of intraventricular hemorrhage. Stable subdural collection over the left anterior frontal convexity subjacent to frontal and temporal craniotomy. Near complete resolution of pneumocephalus. Decreased mass effect with 7 mm of left-to-right  midline shift, previously 9 mm. Increased hypoattenuation within the left medial temporal lobe extending into the left posterior lentiform nucleus and posterior limb of internal capsule likely representing evolving early subacute infarction. No new acute intracranial hemorrhage or herniation. Right frontal approach ventriculostomy catheter is stable in position. No hydrocephalus. Vascular: As below. Skull: Postsurgical changes related to a left frontal and temporal craniotomy with mildly increased edema, but decreased air in the overlying scalp. Sinuses: Imaged portions are clear. Orbits: No acute finding. CTA HEAD Anterior circulation: No significant stenosis, proximal occlusion, new aneurysm, or vascular malformation. Clips left posterior communicating artery aneurysm without residual or recurrent aneurysm identified. Posterior circulation: No significant stenosis, proximal occlusion, aneurysm, or vascular malformation. Venous sinuses: As permitted by contrast timing, patent. Anatomic variants: None significant. Delayed phase: No abnormal intracranial enhancement. IMPRESSION: CT head: 1. Increasing hypoattenuation within the left medial temporal lobe extending to left posterior lentiform nucleus and posterior limb of internal capsule compatible developing early subacute infarction. 2. Mild decrease in volume of intraventricular hemorrhage. 3. Stable subdural collection over the left anterior frontal convexity subjacent to the left frontal and temporal craniotomy. 4. Decreased mass effect with 7 mm of left-to-right midline shift, previously 9 mm. CTA head: 1. No findings of vasospasm. No new large vessel occlusion, aneurysm, or significant stenosis. 2. Left posterior communicating artery aneurysm clip. No residual or recurrent aneurysm identified. Electronically Signed   By: Mitzi Hansen M.D.   On: 08/16/2017 02:20   Dg Abd 1 View  Result Date: 08/15/2017 CLINICAL DATA:  Orogastric tube placement  EXAM: ABDOMEN - 1 VIEW COMPARISON:  None. FINDINGS: Orogastric tube coiled in the stomach with the tip projecting over the cardia of the stomach. There is no bowel dilatation to suggest obstruction. There is no evidence of pneumoperitoneum, portal venous gas or pneumatosis. There are no pathologic calcifications along the expected course of the ureters. The osseous structures are unremarkable. IMPRESSION: Orogastric tube coiled in the stomach with the tip projecting over the cardia of the stomach. Electronically Signed   By: Elige Ko   On: 08/15/2017 11:08   Dg Chest Port 1 View  Result Date: 08/17/2017 CLINICAL DATA:  Acute respiratory failure with hypoxia EXAM: PORTABLE CHEST 1 VIEW COMPARISON:  None. FINDINGS: Nasoenteric feeding tube at least reaches the stomach. Extensive artifact from EKG leads. Linear opacity at the right base favoring atelectasis. There is low volumes with interstitial crowding. No Kerley lines, effusion, or pneumothorax. Borderline cardiomegaly, accentuated by technique. IMPRESSION: Low volumes with mild atelectasis on the right. Electronically Signed   By: Marnee Spring M.D.   On: 08/17/2017 08:18    Assessment/Plan: Postop day 3 clipping of posterior connecting artery aneurysm and external ventricular drain placement. Continue supportive care with ventricular drainage hemodynamic management will challenges the drain repeat CT scan in the morning. Drain output has been minimal.  LOS: 3 days     Joleena Weisenburger P 08/17/2017, 9:01 AM

## 2017-08-18 ENCOUNTER — Inpatient Hospital Stay (HOSPITAL_COMMUNITY): Payer: Medicaid Other

## 2017-08-18 DIAGNOSIS — D72829 Elevated white blood cell count, unspecified: Secondary | ICD-10-CM

## 2017-08-18 DIAGNOSIS — R509 Fever, unspecified: Secondary | ICD-10-CM

## 2017-08-18 LAB — URINALYSIS, COMPLETE (UACMP) WITH MICROSCOPIC
BILIRUBIN URINE: NEGATIVE
Glucose, UA: >=500 mg/dL — AB
KETONES UR: NEGATIVE mg/dL
Nitrite: NEGATIVE
PROTEIN: NEGATIVE mg/dL
Specific Gravity, Urine: 1.017 (ref 1.005–1.030)
pH: 5 (ref 5.0–8.0)

## 2017-08-18 LAB — TYPE AND SCREEN
ABO/RH(D): B POS
Antibody Screen: NEGATIVE
UNIT DIVISION: 0
UNIT DIVISION: 0

## 2017-08-18 LAB — BASIC METABOLIC PANEL
Anion gap: 11 (ref 5–15)
BUN: 8 mg/dL (ref 6–20)
CO2: 23 mmol/L (ref 22–32)
CREATININE: 0.65 mg/dL (ref 0.44–1.00)
Calcium: 9.7 mg/dL (ref 8.9–10.3)
Chloride: 115 mmol/L — ABNORMAL HIGH (ref 101–111)
GFR calc Af Amer: >60 mL/min (ref >60)
GLUCOSE: 189 mg/dL — AB (ref 65–99)
Potassium: 3.5 mmol/L (ref 3.5–5.1)
Sodium: 149 mmol/L — ABNORMAL HIGH (ref 135–145)

## 2017-08-18 LAB — BPAM RBC
BLOOD PRODUCT EXPIRATION DATE: 201906062359
Blood Product Expiration Date: 201906052359
ISSUE DATE / TIME: 201905151231
ISSUE DATE / TIME: 201905151231
UNIT TYPE AND RH: 7300
Unit Type and Rh: 7300

## 2017-08-18 LAB — CBC
HCT: 30.6 % — ABNORMAL LOW (ref 36.0–46.0)
Hemoglobin: 10.2 g/dL — ABNORMAL LOW (ref 12.0–15.0)
MCH: 29.8 pg (ref 26.0–34.0)
MCHC: 33.3 g/dL (ref 30.0–36.0)
MCV: 89.5 fL (ref 78.0–100.0)
Platelets: 269 10*3/uL (ref 150–400)
RBC: 3.42 MIL/uL — ABNORMAL LOW (ref 3.87–5.11)
RDW: 13.8 % (ref 11.5–15.5)
WBC: 16.8 10*3/uL — ABNORMAL HIGH (ref 4.0–10.5)

## 2017-08-18 LAB — GLUCOSE, CAPILLARY
GLUCOSE-CAPILLARY: 166 mg/dL — AB (ref 65–99)
GLUCOSE-CAPILLARY: 128 mg/dL — AB (ref 65–99)
GLUCOSE-CAPILLARY: 137 mg/dL — AB (ref 65–99)
GLUCOSE-CAPILLARY: 164 mg/dL — AB (ref 65–99)
Glucose-Capillary: 207 mg/dL — ABNORMAL HIGH (ref 65–99)
Glucose-Capillary: 187 mg/dL — ABNORMAL HIGH (ref 65–99)

## 2017-08-18 LAB — MAGNESIUM: Magnesium: 1.9 mg/dL (ref 1.7–2.4)

## 2017-08-18 MED ORDER — METOPROLOL TARTRATE 25 MG/10 ML ORAL SUSPENSION
25.0000 mg | Freq: Two times a day (BID) | ORAL | Status: DC
  Administered 2017-08-18 – 2017-08-20 (×5): 25 mg
  Filled 2017-08-18 (×5): qty 10

## 2017-08-18 MED ORDER — IBUPROFEN 100 MG/5ML PO SUSP
400.0000 mg | Freq: Four times a day (QID) | ORAL | Status: DC | PRN
  Administered 2017-08-18 – 2017-08-20 (×3): 400 mg via ORAL
  Filled 2017-08-18 (×3): qty 20

## 2017-08-18 MED ORDER — METOPROLOL TARTRATE 25 MG PO TABS: 25.0000 mg | ORAL_TABLET | Freq: Two times a day (BID) | ORAL | Status: DC

## 2017-08-18 NOTE — Progress Notes (Signed)
STROKE TEAM PROGRESS NOTE   INTERVAL HISTORY Two family member are leaving when I stepped into room. Today she has low grade fever with HR at 120s, sinus. Breathing much better and less secretions, able to repeat simple sentences but perseverated after. EVD drain clamped. CT head stable and will repeat CT head in am to see if EVD can be removed.     Vitals:   08/18/17 0700 08/18/17 0800 08/18/17 0900 08/18/17 1000  BP: 134/82 (!) 153/90 (!) 160/100 134/79  Pulse: (!) 107 (!) 112 (!) 111 (!) 113  Resp: (!) 26 (!) 22 20 (!) 26  Temp:  100 F (37.8 C)    TempSrc:  Axillary    SpO2: 98% 99% 100% 96%  Weight:      Height:        CBC:  Recent Labs  Lab 08/16/17 0316 08/17/17 0454 08/18/17 0241  WBC 19.9* 19.3* 16.8*  NEUTROABS 16.1* 15.0*  --   HGB 9.0* 9.2* 10.2*  HCT 27.3* 27.1* 30.6*  MCV 90.1 87.7 89.5  PLT 220 227 269    Basic Metabolic Panel:  Recent Labs  Lab 08/16/17 0316 08/17/17 0454 08/18/17 0241  NA 145 146* 149*  K 3.0* 2.8* 3.5  CL 114* 113* 115*  CO2 GLUCOSE 159* 160* 189*  BUN 5* 5* 8  CREATININE 0.57 0.55 0.65  CALCIUM 8.6* 8.9 9.7  MG 2.0  --  1.9   Lipid Panel:     Component Value Date/Time   CHOL 120 08/16/2017 0316   TRIG 77 08/16/2017 0316   HDL 53 08/16/2017 0316   CHOLHDL 2.3 08/16/2017 0316   VLDL 15 08/16/2017 0316   LDLCALC 52 08/16/2017 0316   HgbA1c:  Lab Results  Component Value Date   HGBA1C 6.0 (H) 08/15/2017   Urine Drug Screen:     Component Value Date/Time   LABOPIA NONE DETECTED 08/14/2017 0644   COCAINSCRNUR NONE DETECTED 08/14/2017 0644   LABBENZ NONE DETECTED 08/14/2017 0644   AMPHETMU NONE DETECTED 08/14/2017 0644   THCU NONE DETECTED 08/14/2017 0644   LABBARB NONE DETECTED 08/14/2017 0644    Alcohol Level     Component Value Date/Time   ETH <10 08/14/2017 0647    IMAGING I have personally reviewed the radiological images below and agree with the radiology interpretations.  Ct Angio Head W  Or Wo Contrast  Result Date: 08/16/2017 CLINICAL DATA:  62 y/o F; cerebral aneurysm and subarachnoid hemorrhage. Evaluation for cerebral vasospasm. EXAM: CT ANGIOGRAPHY HEAD TECHNIQUE: Multidetector CT imaging of the head was performed using the standard protocol during bolus administration of intravenous contrast. Multiplanar CT image reconstructions and MIPs were obtained to evaluate the vascular anatomy. CONTRAST:  50 cc Isovue 370 COMPARISON:  08/14/2017 CT angiogram of the head and CT of the head. FINDINGS: CT HEAD Brain: Mild interval decrease in volume of intraventricular hemorrhage. Stable subdural collection over the left anterior frontal convexity subjacent to frontal and temporal craniotomy. Near complete resolution of pneumocephalus. Decreased mass effect with 7 mm of left-to-right midline shift, previously 9 mm. Increased hypoattenuation within the left medial temporal lobe extending into the left posterior lentiform nucleus and posterior limb of internal capsule likely representing evolving early subacute infarction. No new acute intracranial hemorrhage or herniation. Right frontal approach ventriculostomy catheter is stable in position. No hydrocephalus. Vascular: As below. Skull: Postsurgical changes related to a left frontal and temporal craniotomy with mildly increased edema, but decreased air in the overlying scalp.  Sinuses: Imaged portions are clear. Orbits: No acute finding. CTA HEAD Anterior circulation: No significant stenosis, proximal occlusion, new aneurysm, or vascular malformation. Clips left posterior communicating artery aneurysm without residual or recurrent aneurysm identified. Posterior circulation: No significant stenosis, proximal occlusion, aneurysm, or vascular malformation. Venous sinuses: As permitted by contrast timing, patent. Anatomic variants: None significant. Delayed phase: No abnormal intracranial enhancement. IMPRESSION: CT head: 1. Increasing hypoattenuation within  the left medial temporal lobe extending to left posterior lentiform nucleus and posterior limb of internal capsule compatible developing early subacute infarction. 2. Mild decrease in volume of intraventricular hemorrhage. 3. Stable subdural collection over the left anterior frontal convexity subjacent to the left frontal and temporal craniotomy. 4. Decreased mass effect with 7 mm of left-to-right midline shift, previously 9 mm. CTA head: 1. No findings of vasospasm. No new large vessel occlusion, aneurysm, or significant stenosis. 2. Left posterior communicating artery aneurysm clip. No residual or recurrent aneurysm identified. Electronically Signed   By: Mitzi Hansen M.D.   On: 08/16/2017 02:20   Ct Angio Head W Or Wo Contrast  Result Date: 08/14/2017 CLINICAL DATA:  Intracranial hemorrhage EXAM: CT ANGIOGRAPHY HEAD AND NECK TECHNIQUE: Multidetector CT imaging of the head and neck was performed using the standard protocol during bolus administration of intravenous contrast. Multiplanar CT image reconstructions and MIPs were obtained to evaluate the vascular anatomy. Carotid stenosis measurements (when applicable) are obtained utilizing NASCET criteria, using the distal internal carotid diameter as the denominator. CONTRAST:  50 mL ISOVUE-370 IOPAMIDOL (ISOVUE-370) INJECTION 76% COMPARISON:  CT head 08/14/2017 FINDINGS: CTA NECK FINDINGS Aortic arch: Normal aortic arch and proximal great vessels. Right carotid system: Right carotid system widely patent. Bifurcation widely patent. Left carotid system: Left carotid system widely patent. Left carotid bifurcation patent. Vertebral arteries: Both vertebral arteries widely patent without stenosis or irregularity. Skeleton: Degenerative changes in the cervical spine with spondylosis and anterolisthesis C3-4. No acute skeletal abnormality. Dental caries. Other neck: Negative for mass or adenopathy in the neck. Upper chest: Visualized lung apices clear.  Review of the MIP images confirms the above findings CTA HEAD FINDINGS Anterior circulation: Right cavernous carotid widely patent. Right anterior and middle cerebral arteries widely patent Left cavernous carotid widely patent. 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 This apex extends into the left temporal hematoma. Hematoma felt to be due to rupture of this aneurysm. Left anterior and middle cerebral arteries widely patent without stenosis or vasospasm. Posterior circulation: Both vertebral arteries patent to the basilar. Basilar widely patent. Right PICA patent. Left PICA not visualized. Dominant left AICA. Superior cerebellar and posterior cerebral arteries patent bilaterally without stenosis. No aneurysm in the posterior circulation. Venous sinuses: Normal venous enhancement Anatomic variants: None Delayed phase: Left temporal hematoma unchanged. Subdural hematoma left frontal lobe and along the tentorium on the left also unchanged. Intraventricular hemorrhage unchanged. Mild hydrocephalus. 11 mm midline shift to the right unchanged. No new hemorrhage since the CT earlier today. 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 Extensive intracranial hemorrhage is stable from earlier this morning. No other aneurysm. No significant atherosclerotic disease. These results were called by telephone at the time of interpretation on 08/14/2017 at 10:37 am to Dr. Conchita Paris , who verbally acknowledged these results. Electronically Signed   By: Marlan Palau M.D.  On: 08/14/2017 10:37   Ct Head Wo Contrast  Result Date: 08/14/2017 CLINICAL DATA:  62 y/o  F; 08/14/2017 CT head and CTA head. EXAM: CT HEAD WITHOUT CONTRAST TECHNIQUE: Contiguous axial images were obtained  from the base of the skull through the vertex without intravenous contrast. COMPARISON:  08/14/2017 CT head and CTA head. FINDINGS: Brain: Interval left frontal and temporal craniotomy postsurgical changes with air and edema in the overlying scalp. Small volume pneumocephalus overlying the frontal lobes. Interval placement of a left posterior communicating artery aneurysm clip in the left suprasellar cistern. Interval evacuation of hematoma within the left anterior temporal lobe with small residual fluid-filled cavity. Stable volume of intraventricular hemorrhage within the left greater than right lateral ventricles, third ventricle, and fourth ventricle. Stable small volume of hemorrhage overlying the left tentorium cerebelli. Interval placement of a right frontal approach ventriculostomy catheter with tip in the frontal horn of right lateral ventricle. Stable ventricle size. Decreased left-to-right midline shift of 9 mm, previously 11 mm. No downward herniation. Vascular: Interval placement of left suprasellar cistern aneurysm clips. Skull: Postsurgical changes related to left frontal and temporal craniotomy with air and edema in the overlying scalp as well as skin staples. Sinuses/Orbits: No acute finding. Other: None. IMPRESSION: 1. Interval left frontal and temporal craniotomy, left anterior temporal lobe hematoma evacuation, right frontal approach ventriculostomy catheter, and clipping of left posterior communicating artery aneurysm. 2. Associated postsurgical changes with air and edema in the overlying scalp and small volume of pneumocephalus. 3. Stable residual intraventricular hemorrhage and subdural along the left tentorium cerebelli. 4. No new acute intracranial abnormality. 5. Decreased mass effect with 9 mm left-to-right midline shift, previously 11 mm. Electronically Signed   By: Mitzi Hansen M.D.   On: 08/14/2017 23:20   Ct Angio Neck W Or Wo Contrast  Result Date: 08/14/2017 CLINICAL  DATA:  Intracranial hemorrhage EXAM: CT ANGIOGRAPHY HEAD AND NECK TECHNIQUE: Multidetector CT imaging of the head and neck was performed using the standard protocol during bolus administration of intravenous contrast. Multiplanar CT image reconstructions and MIPs were obtained to evaluate the vascular anatomy. Carotid stenosis measurements (when applicable) are obtained utilizing NASCET criteria, using the distal internal carotid diameter as the denominator. CONTRAST:  50 mL ISOVUE-370 IOPAMIDOL (ISOVUE-370) INJECTION 76% COMPARISON:  CT head 08/14/2017 FINDINGS: CTA NECK FINDINGS Aortic arch: Normal aortic arch and proximal great vessels. Right carotid system: Right carotid system widely patent. Bifurcation widely patent. Left carotid system: Left carotid system widely patent. Left carotid bifurcation patent. Vertebral arteries: Both vertebral arteries widely patent without stenosis or irregularity. Skeleton: Degenerative changes in the cervical spine with spondylosis and anterolisthesis C3-4. No acute skeletal abnormality. Dental caries. Other neck: Negative for mass or adenopathy in the neck. Upper chest: Visualized lung apices clear. Review of the MIP images confirms the above findings CTA HEAD FINDINGS Anterior circulation: Right cavernous carotid widely patent. Right anterior and middle cerebral arteries widely patent Left cavernous carotid widely patent. 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 This apex extends into the left temporal hematoma. Hematoma felt to be due to rupture of this aneurysm. Left anterior and middle cerebral arteries widely patent without stenosis or vasospasm. Posterior circulation: Both vertebral arteries patent to the basilar. Basilar widely patent. Right PICA patent. Left PICA not visualized. Dominant left AICA. Superior cerebellar and posterior cerebral arteries patent bilaterally without stenosis. No  aneurysm in the posterior circulation. Venous sinuses: Normal venous  enhancement Anatomic variants: None Delayed phase: Left temporal hematoma unchanged. Subdural hematoma left frontal lobe and along the tentorium on the left also unchanged. Intraventricular hemorrhage unchanged. Mild hydrocephalus. 11 mm midline shift to the right unchanged. No new hemorrhage since the CT earlier today. 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 Extensive intracranial hemorrhage is stable from earlier this morning. No other aneurysm. No significant atherosclerotic disease. These results were called by telephone at the time of interpretation on 08/14/2017 at 10:37 am to Dr. Conchita Paris , who verbally acknowledged these results. Electronically Signed   By: Marlan Palau M.D.   On: 08/14/2017 10:37   Dg Chest Port 1 View  Result Date: 08/17/2017 CLINICAL DATA:  Acute respiratory failure with hypoxia EXAM: PORTABLE CHEST 1 VIEW COMPARISON:  None. FINDINGS: Nasoenteric feeding tube at least reaches the stomach. Extensive artifact from EKG leads. Linear opacity at the right base favoring atelectasis. There is low volumes with interstitial crowding. No Kerley lines, effusion, or pneumothorax. Borderline cardiomegaly, accentuated by technique. IMPRESSION: Low volumes with mild atelectasis on the right. Electronically Signed   By: Marnee Spring M.D.   On: 08/17/2017 08:18   TTE  - Left ventricle: The cavity size was normal. Systolic function was   vigorous. The estimated ejection fraction was in the range of 65%   to 70%. Wall motion was normal; there were no regional wall   motion abnormalities. Doppler parameters are consistent with   abnormal left ventricular relaxation (grade 1 diastolic   dysfunction). There was no evidence of elevated ventricular    filling pressure by Doppler parameters. - Aortic valve: Trileaflet; normal thickness leaflets. Valve area   (VTI): 2.01 cm^2. Valve area (Vmax): 2.18 cm^2. Valve area   (Vmean): 2.06 cm^2. - Aortic root: The aortic root was normal in size. - Mitral valve: There was mild regurgitation. - Right ventricle: Systolic function was normal. - Right atrium: The atrium was normal in size. - Tricuspid valve: There was moderate regurgitation. - Pulmonic valve: There was no regurgitation. - Pulmonary arteries: Systolic pressure was moderately increased.   PA peak pressure: 48 mm Hg (S). - Inferior vena cava: The vessel was normal in size. - Pericardium, extracardiac: There was no pericardial effusion.  Ct Head Wo Contrast  Result Date: 08/18/2017 CLINICAL DATA:  Follow-up examination for subarachnoid hemorrhage. EXAM: CT HEAD WITHOUT CONTRAST TECHNIQUE: Contiguous axial images were obtained from the base of the skull through the vertex without intravenous contrast. COMPARISON:  Prior CT from 08/14/2017 FINDINGS: Brain: Postoperative changes from prior left frontotemporal craniotomy for surgical clipping of left P com aneurysm again seen. Postoperative pneumocephalus has essentially resolved. Sequelae of prior evacuation of left temporal lobe hematoma with decreasing hemorrhage. Persistent extra-axial collection overlies the anterior left frontotemporal convexity without significant mass effect. Persistent small volume subdural hemorrhage along the left tentorium measuring up to 4 mm in maximal thickness. Persistent 5 mm of left-to-right shift, improved from previous. Right frontal approach ventriculostomy remains in place with tip position within the right lateral ventricle. Markedly decreased intraventricular hemorrhage as compared to previous. No hydrocephalus or ventricular trapping. Evolving 2.2 cm subacute ischemic infarct involving the lateral left thalamus/posterior limb of the left internal capsule with  inferior extension. Vascular: Aneurysm clip in the region of the left posterior communicating artery. No new hyperdense vessel. Skull: Postoperative changes from prior left frontotemporal  craniotomy. Skin staples remain in place. Sinuses/Orbits: Globes and orbital soft tissues within normal limits. Nasogastric tube partially visualized. Paranasal sinuses and mastoid air cells are largely clear. Other: None. IMPRESSION: 1. Postoperative changes from prior left frontotemporal craniotomy for surgical clipping of left P com aneurysm and left temporal hematoma evacuation. Resolved postoperative pneumocephalus with improved left-to-right shift now measuring 5 mm. 2. Right frontal approach ventriculostomy in place with decreased intraventricular hemorrhage. No hydrocephalus. 3. Persistent small volume subdural hemorrhage along the left tentorium. 4. Evolving subacute ischemic infarct at the lateral left thalamus/posterior limb of the left internal capsule. 5. No other new acute intracranial abnormality. Electronically Signed   By: Rise Mu M.D.   On: 08/18/2017 04:55   TCD 08/16/17 - no vasospasm  TCD pending  CT head pending    PHYSICAL EXAM Temp:  [99.1 F (37.3 C)-101.9 F (38.8 C)] 100 F (37.8 C) (05/19 0800) Pulse Rate:  [92-132] 113 (05/19 1000) Resp:  [20-28] 26 (05/19 1000) BP: (128-166)/(76-100) 134/79 (05/19 1000) SpO2:  [96 %-100 %] 96 % (05/19 1000) FiO2 (%):  [100 %] 100 % (05/18 2050) Weight:  [144 lb 10 oz (65.6 kg)] 144 lb 10 oz (65.6 kg) (05/19 0500)  General - Well nourished, well developed, lethargic  Ophthalmologic - Fundi not visualized due to noncooperation.  Cardiovascular - Regular rate and rhythm.  Neuro - lethargic, but eyes open on the left and with ptosis on the right. Left pupil 3mm and right pupil 2.31mm, reactive to light bilaterally, left gaze preference and not able to cross midline. Blinking to visual threat to the left but not to the right. Right  facial droop and tongue midline. Pt was not able to follow simple command today but able to repeat simple sentences "I am here" but then perseverated on it with any further questions, LUE 4/5, LLE 3/5, RUE 1/5 on pain, and RLE 2/5 on pain stimulation. DTR 1+ and no babinski. Sensation, coordination not cooperative and gait not tested.   ASSESSMENT/PLAN Carla Bauer is a 62 y.o. female with no significant history found having fallen out of the bed with R arm weakness and altered mental status. 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  Left temporal lobe ICH w IVH/SAH/SDH, d/t L PCOM aneurysm rupture s/p clipping, EVD and hematoma evacuation  CT head L mesial temporal hmg 12 cc w/ IVH. Surrounding edema with L to R shift 11mm. 6mm SDH along L convexity. ASPECTS 10.   CTA head & neck LPCom aneurysm c/w rupture into L temporal lobe. Extensive hmg stable from earlier.  S/p crani w/ clipping of L PCom aneurysm, hematoma evacutaion and EVD placement 5/15 (Nundkumar)  CT head repeat interval L frontal and temp crani, L ant temporal lobe hematoma evac w/ R fronal appoach, clip L PCom. Post OR changes w/ air and edema. Stable IVH and SDH. No new abn. Decreased mass effect to 9mm L to R  CTA repeat showed left PCOM s/p clip and left temporal ICH evacuation, however, new left thalamus and left optic radiation infarcts  CT repeat 08/18/17 stable hematoma and no hydro  CT repeat 08/19/17 pending  TCD MWF - 08/16/17 no vasospasm. 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, likely due to left PCOM/PCA/AchA manipulation with PCOM aneurysm clipping  Resultant - right hemiplegia  CTA repeat -  patent left PCA and MCA  CT repeat 08/18/17 - stable IVH and no hydro  Consider MRI once off EVD drain and able to go to MRI  May consider ASA pending CT repeat in am  PT/OT - pending  LDL 52   A1C 6.0  Repeat CT pending  Left ptosis   Left ptosis with mildly larger left pupil  Likely mild injury of left CNIII with left PCOM aneurysm clipping  Respiratory distress, improved  Intubated for crani  extubate 08/16/17, tolerating well   CXR mild right atelectasis  On mucomyst nebs  PCCM following  CXR pending  Leukocytosis with low grade fever  WBC 12.5->19.9->19.3->16.8  Tmax 100.5->101.9  UA neg - UA repeat pending  CXR mild right atelectasis - CXR repeat pending  Blood pressure  controlled  SBP goal < 140  off cardene drip  Labetalol PRN  Tachycardia   Likely related to fever  On metoprolol  bid  Other Stroke Risk Factors  No family hx aneurysms  Other Active Problems  Hypokalemia 3.3 -> 3.0->2.8->3.5, supplement   Hospital day # 4  This patient is critically ill due to ICH, SAH, IVH, PCOM aneurysm, s/p hematoma evacuation, EVD, fever, leukocytosis and aneurysm clipping and at significant risk of neurological worsening, death form re-bleed, hydrocephalus, seizure, cerebral edema and brain herniation. This patient's care requires constant monitoring of vital signs, hemodynamics, respiratory and cardiac monitoring, review of multiple databases, neurological assessment, discussion with family, other specialists and medical decision making of high complexity. I spent 40 minutes of neurocritical care time in the care of this patient.  Marvel Plan, MD PhD Stroke Neurology 08/18/2017 11:12 AM    To contact Stroke Continuity provider, please refer to [email or web address]. After hours, contact General Neurology

## 2017-08-18 NOTE — Progress Notes (Signed)
Spoke with Dr. Wynetta Emery of neurosurgery regarding patient's increasing drowsiness. Received order to level drain at 10 mm Hg and open it to drain continuously. Will continue to monitor and update as needed.

## 2017-08-18 NOTE — Progress Notes (Signed)
External ventricular drain clamped by Dr. Wynetta Emery of neurosurgery at 0800.

## 2017-08-18 NOTE — Progress Notes (Signed)
Subjective: Patient reports Patient awakens to stimulation nonverbal  Objective: Vital signs in last 24 hours: Temp:  [99.1 F (37.3 C)-101.9 F (38.8 C)] 99.3 F (37.4 C) (05/19 0400) Pulse Rate:  [85-132] 112 (05/19 0800) Resp:  [19-28] 22 (05/19 0800) BP: (128-166)/(74-91) 153/90 (05/19 0800) SpO2:  [96 %-100 %] 99 % (05/19 0800) FiO2 (%):  [100 %] 100 % (05/18 2050) Weight:  [65.6 kg (144 lb 10 oz)] 65.6 kg (144 lb 10 oz) (05/19 0500)  Intake/Output from previous day: 05/18 0701 - 05/19 0700 In: 3206.3 [I.V.:1906.3; NG/GT:1300] Out: 2468 [Urine:2350; Drains:118] Intake/Output this shift: Total I/O In: 125 [I.V.:75; NG/GT:50] Out: -   Purposeful and localizes but would not follow commands on the left right hemiparesis  Lab Results: Recent Labs    08/17/17 0454 08/18/17 0241  WBC 19.3* 16.8*  HGB 9.2* 10.2*  HCT 27.1* 30.6*  PLT 227 269   BMET Recent Labs    08/17/17 0454 08/18/17 0241  NA 146* 149*  K 2.8* 3.5  CL 113* 115*  CO2 24 23  GLUCOSE 160* 189*  BUN 5* 8  CREATININE 0.55 0.65  CALCIUM 8.9 9.7    Studies/Results: Ct Head Wo Contrast  Result Date: 08/18/2017 CLINICAL DATA:  Follow-up examination for subarachnoid hemorrhage. EXAM: CT HEAD WITHOUT CONTRAST TECHNIQUE: Contiguous axial images were obtained from the base of the skull through the vertex without intravenous contrast. COMPARISON:  Prior CT from 08/14/2017 FINDINGS: Brain: Postoperative changes from prior left frontotemporal craniotomy for surgical clipping of left P com aneurysm again seen. Postoperative pneumocephalus has essentially resolved. Sequelae of prior evacuation of left temporal lobe hematoma with decreasing hemorrhage. Persistent extra-axial collection overlies the anterior left frontotemporal convexity without significant mass effect. Persistent small volume subdural hemorrhage along the left tentorium measuring up to 4 mm in maximal thickness. Persistent 5 mm of left-to-right  shift, improved from previous. Right frontal approach ventriculostomy remains in place with tip position within the right lateral ventricle. Markedly decreased intraventricular hemorrhage as compared to previous. No hydrocephalus or ventricular trapping. Evolving 2.2 cm subacute ischemic infarct involving the lateral left thalamus/posterior limb of the left internal capsule with inferior extension. Vascular: Aneurysm clip in the region of the left posterior communicating artery. No new hyperdense vessel. Skull: Postoperative changes from prior left frontotemporal craniotomy. Skin staples remain in place. Sinuses/Orbits: Globes and orbital soft tissues within normal limits. Nasogastric tube partially visualized. Paranasal sinuses and mastoid air cells are largely clear. Other: None. IMPRESSION: 1. Postoperative changes from prior left frontotemporal craniotomy for surgical clipping of left P com aneurysm and left temporal hematoma evacuation. Resolved postoperative pneumocephalus with improved left-to-right shift now measuring 5 mm. 2. Right frontal approach ventriculostomy in place with decreased intraventricular hemorrhage. No hydrocephalus. 3. Persistent small volume subdural hemorrhage along the left tentorium. 4. Evolving subacute ischemic infarct at the lateral left thalamus/posterior limb of the left internal capsule. 5. No other new acute intracranial abnormality. Electronically Signed   By: Rise Mu M.D.   On: 08/18/2017 04:55   Dg Chest Port 1 View  Result Date: 08/17/2017 CLINICAL DATA:  Acute respiratory failure with hypoxia EXAM: PORTABLE CHEST 1 VIEW COMPARISON:  None. FINDINGS: Nasoenteric feeding tube at least reaches the stomach. Extensive artifact from EKG leads. Linear opacity at the right base favoring atelectasis. There is low volumes with interstitial crowding. No Kerley lines, effusion, or pneumothorax. Borderline cardiomegaly, accentuated by technique. IMPRESSION: Low volumes  with mild atelectasis on the right. Electronically Signed   By:  Marnee Spring M.D.   On: 08/17/2017 08:18    Assessment/Plan: Postop day for clipping of P, aneurysm post-bleed day 4. We will clamp the ventriculostomy CT this morning look stable repeat CT in the morning.  LOS: 4 days     Carole Deere P 08/18/2017, 8:17 AM

## 2017-08-19 ENCOUNTER — Inpatient Hospital Stay (HOSPITAL_COMMUNITY): Payer: Medicaid Other

## 2017-08-19 DIAGNOSIS — H02402 Unspecified ptosis of left eyelid: Secondary | ICD-10-CM

## 2017-08-19 LAB — BASIC METABOLIC PANEL
Anion gap: 12 (ref 5–15)
BUN: 10 mg/dL (ref 6–20)
CALCIUM: 9.5 mg/dL (ref 8.9–10.3)
CO2: 22 mmol/L (ref 22–32)
CREATININE: 0.57 mg/dL (ref 0.44–1.00)
Chloride: 114 mmol/L — ABNORMAL HIGH (ref 101–111)
GFR calc non Af Amer: >60 mL/min (ref >60)
Glucose, Bld: 190 mg/dL — ABNORMAL HIGH (ref 65–99)
Potassium: 3.4 mmol/L — ABNORMAL LOW (ref 3.5–5.1)
Sodium: 148 mmol/L — ABNORMAL HIGH (ref 135–145)

## 2017-08-19 LAB — CBC
HCT: 32.1 % — ABNORMAL LOW (ref 36.0–46.0)
Hemoglobin: 10.3 g/dL — ABNORMAL LOW (ref 12.0–15.0)
MCH: 29.3 pg (ref 26.0–34.0)
MCHC: 32.1 g/dL (ref 30.0–36.0)
MCV: 91.2 fL (ref 78.0–100.0)
PLATELETS: 289 10*3/uL (ref 150–400)
RBC: 3.52 MIL/uL — AB (ref 3.87–5.11)
RDW: 14.2 % (ref 11.5–15.5)
WBC: 15.5 10*3/uL — AB (ref 4.0–10.5)

## 2017-08-19 LAB — GLUCOSE, CAPILLARY
GLUCOSE-CAPILLARY: 176 mg/dL — AB (ref 65–99)
GLUCOSE-CAPILLARY: 154 mg/dL — AB (ref 65–99)
Glucose-Capillary: 152 mg/dL — ABNORMAL HIGH (ref 65–99)
Glucose-Capillary: 173 mg/dL — ABNORMAL HIGH (ref 65–99)
Glucose-Capillary: 202 mg/dL — ABNORMAL HIGH (ref 65–99)
Glucose-Capillary: 210 mg/dL — ABNORMAL HIGH (ref 65–99)

## 2017-08-19 LAB — PROCALCITONIN: PROCALCITONIN: 0.23 ng/mL

## 2017-08-19 MED ORDER — ASPIRIN 81 MG PO CHEW
81.0000 mg | CHEWABLE_TABLET | Freq: Every day | ORAL | Status: DC
  Administered 2017-08-19 – 2017-09-03 (×16): 81 mg via ORAL
  Filled 2017-08-19 (×16): qty 1

## 2017-08-19 MED ORDER — FREE WATER
200.0000 mL | Freq: Three times a day (TID) | Status: DC
  Administered 2017-08-19 – 2017-08-20 (×4): 200 mL

## 2017-08-19 MED ORDER — SODIUM CHLORIDE 0.45 % IV SOLN: INTRAVENOUS | Status: DC

## 2017-08-19 MED ORDER — HEPARIN SODIUM (PORCINE) 5000 UNIT/ML IJ SOLN
5000.0000 [IU] | Freq: Three times a day (TID) | INTRAMUSCULAR | Status: DC
  Administered 2017-08-19 – 2017-09-03 (×45): 5000 [IU] via SUBCUTANEOUS
  Filled 2017-08-19 (×46): qty 1

## 2017-08-19 MED ORDER — SODIUM CHLORIDE 0.9 % IV SOLN
1.0000 g | INTRAVENOUS | Status: DC
  Administered 2017-08-19 – 2017-08-24 (×6): 1 g via INTRAVENOUS
  Filled 2017-08-19 (×7): qty 10

## 2017-08-19 NOTE — Evaluation (Signed)
Physical Therapy Evaluation Patient Details Name: Carla Bauer MRN: [id number] DOB: Jan 28, 1956 Today's Date: 08/19/2017   History of Present Illness  pt is a 62 y/o female with no significant medical history admitted with R sided weakness.  Pt s/p L crani for aneurysm clipping, evacuation of left temporal hematoma and placement of right frontal ventricular drain.  Pt remained intubated post crani, CT showing new left thalamic/posterior internal capsule and optic radiation infarcts.`  Clinical Impression  Pt admitted with/for clipping of aneurysm, then post op L side infarcts described above.  Pt now needing significant assist for mobility and pt showing significant neglect of her R side..  Pt currently limited functionally due to the problems listed. ( See problems list.)   Pt will benefit from PT to maximize function and safety in order to get ready for next venue listed below.     Follow Up Recommendations CIR    Equipment Recommendations  Other (comment)(TBA)    Recommendations for Other Services Rehab consult     Precautions / Restrictions Precautions Precautions: Fall Restrictions Weight Bearing Restrictions: No      Mobility  Bed Mobility Overal bed mobility: Needs Assistance Bed Mobility: Rolling;Sidelying to Sit Rolling: Max assist Sidelying to sit: Max assist;+2 for physical assistance(to max assist)       General bed mobility comments: cued to push up with L UE and pt did assist with additional truncal assist given  Transfers Overall transfer level: Needs assistance Equipment used: 2 person hand held assist Transfers: Sit to/from [employer] Sit to Stand: Max assist;+2 physical assistance   Squat pivot transfers: Max assist;+2 physical assistance     General transfer comment: cues for direction.  Assist of 2 to help pt forward and upright.  Significant truncal and R LE assist throughout squat transfer.  Ambulation/Gait              General Gait Details: not able today  Stairs            Wheelchair Mobility    Modified Rankin (Stroke Patients Only) Modified Rankin (Stroke Patients Only) Pre-Morbid Rankin Score: No symptoms Modified Rankin: Severe disability     Balance Overall balance assessment: Needs assistance Sitting-balance support: Bilateral upper extremity supported Sitting balance-Leahy Scale: Poor Sitting balance - Comments: Worked EOB on upright sitting balance and w/shifting L into midline for >5 min   Standing balance support: Bilateral upper extremity supported Standing balance-Leahy Scale: Poor                               Pertinent Vitals/Pain Faces Pain Scale: No hurt    Home Living Family/patient expects to be discharged to:: Private residence Living Arrangements: Children                    Prior Function Level of Independence: Independent               Hand Dominance        Extremity/Trunk Assessment   Upper Extremity Assessment Upper Extremity Assessment: Defer to OT evaluation    Lower Extremity Assessment Lower Extremity Assessment: RLE deficits/detail;LLE deficits/detail RLE Deficits / Details: As she became more alert, pt spontaneously moved left LE around.  She was able to bear full weight during standing. LLE Deficits / Details: associated movements only to cough otherwise PROM was stiff LLE Coordination: decreased fine motor;decreased gross motor       Communication  Communication: No difficulties  Cognition Arousal/Alertness: Lethargic Behavior During Therapy: Flat affect;WFL for tasks assessed/performed Overall Cognitive Status: Impaired/Different from baseline Area of Impairment: Orientation;Attention;Memory;Following commands;Awareness;Safety/judgement;Problem solving                   Current Attention Level: Focused   Following Commands: Follows one step commands inconsistently Safety/Judgement: Decreased  awareness of safety;Decreased awareness of deficits Awareness: Intellectual Problem Solving: Slow processing;Requires verbal cues;Requires tactile cues;Difficulty sequencing;Decreased initiation General Comments: pt shows neglect of her right side, gaze consistently held to the left      General Comments General comments (skin integrity, edema, etc.): BP sitting 146/90, VSS    Exercises     Assessment/Plan    PT Assessment Patient needs continued PT services  PT Problem List Decreased strength;Decreased activity tolerance;Decreased balance;Decreased mobility;Decreased coordination;Decreased safety awareness       PT Treatment Interventions Functional mobility training;Therapeutic activities;Balance training;Patient/family education;Neuromuscular re-education;DME instruction    PT Goals (Current goals can be found in the Care Plan section)  Acute Rehab PT Goals Patient Stated Goal: pt unable to state.  pt's children interested in rehab. PT Goal Formulation: With patient/family Time For Goal Achievement: 09/02/17 Potential to Achieve Goals: Fair    Frequency Min 3X/week   Barriers to discharge        Co-evaluation               AM-PAC PT "6 Clicks" Daily Activity  Outcome Measure Difficulty turning over in bed (including adjusting bedclothes, sheets and blankets)?: Unable Difficulty moving from lying on back to sitting on the side of the bed? : Unable Difficulty sitting down on and standing up from a chair with arms (e.g., wheelchair, bedside commode, etc,.)?: Unable Help needed moving to and from a bed to chair (including a wheelchair)?: Total Help needed walking in hospital room?: Total Help needed climbing 3-5 steps with a railing? : Total 6 Click Score: 6    End of Session   Activity Tolerance: Patient limited by fatigue;Patient tolerated treatment well Patient left: in chair;with chair alarm set;with call bell/phone within reach;Other (comment)(posey  alarm) Nurse Communication: Mobility status PT Visit Diagnosis: Hemiplegia and hemiparesis Hemiplegia - Right/Left: Right Hemiplegia - dominant/non-dominant: Dominant Hemiplegia - caused by: Cerebral infarction    Time: 1610-9604 PT Time Calculation (min) (ACUTE ONLY): 41 min   Charges:   PT Evaluation $PT Eval Moderate Complexity: 1 Mod PT Treatments $Therapeutic Activity: 8-22 mins   PT G Codes:        September 01, 2017  Woodland Bing, PT [phone]  (pager)  Carla Bauer 09/01/17, 11:28 AM

## 2017-08-19 NOTE — Progress Notes (Signed)
Inpatient Rehabilitation  Per PT request, patient was screened by Zarinah Oviatt for appropriateness for an Inpatient Acute Rehab consult.  At this time we are recommending an Inpatient Rehab consult.  Please order if you are agreeable.    Retia Cordle, M.A., CCC/SLP Admission Coordinator  [facility]  Cell [phone]  

## 2017-08-19 NOTE — Progress Notes (Addendum)
PULMONARY / CRITICAL CARE MEDICINE   Name: Carla Bauer MRN: [id number] DOB: 04-24-1955    ADMISSION DATE:  08/14/2017 CONSULTATION DATE:  08/14/2017  REFERRING MD:  Patric Dykes - CNSA.   CHIEF COMPLAINT:  Aneurysmal subarachnoid hemorrhage.   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  Remained on vent post op.  SUBJECTIVE:   More sleepy yesterday afternoon after EVD clamped.  Somewhat improved with drain opened.  Remains somnolent. Protecting airway.    VITAL SIGNS: BP (!) 170/90   Pulse 100   Temp 99.8 F (37.7 C) (Oral)   Resp (!) 26   Ht  (1.549 m)   Wt 64.8 kg (142 lb 13.7 oz)   SpO2 98%   BMI 26.99 kg/m   INTAKE / OUTPUT: I/O last 3 completed shifts: In: 4027.9 [I.V.:2227.9; NG/GT:1800] Out: 3791 [Urine:3400; Drains:141; Stool:250]  PHYSICAL EXAMINATION: General:  Chronically ill appearing female, NAD  HEENT: MM pink/moist, EVD c/d Neuro: somnolent, eyes open, moves spontaneously, does not track, does not follow commands, R sided weakness   CV: s1s2 rrr, no m/r/g PULM: even/non-labored on [place], lungs bilaterally clear  WU:JWJX, non-tender, bsx4 active  Extremities: warm/dry, no sig edema  Skin: no rashes or lesions   LABS:  BMET Recent Labs  Lab 08/16/17 0316 08/17/17 0454 08/18/17 0241  NA 145 146* 149*  K 3.0* 2.8* 3.5  CL 114* 113* 115*  CO2 BUN 5* 5* 8  CREATININE 0.57 0.55 0.65  GLUCOSE 159* 160* 189*    Electrolytes Recent Labs  Lab 08/16/17 0316 08/17/17 0454 08/18/17 0241  CALCIUM 8.6* 8.9 9.7  MG 2.0  --  1.9    CBC Recent Labs  Lab 08/16/17 0316 08/17/17 0454 08/18/17 0241  WBC 19.9* 19.3* 16.8*  HGB 9.0* 9.2* 10.2*  HCT 27.3* 27.1* 30.6*  PLT 220 227 269    Coag's Recent Labs  Lab 08/14/17 0648  APTT 25  INR 1.01    Sepsis Markers No results for input(s):  LATICACIDVEN, PROCALCITON, O2SATVEN in the last 168 hours.  ABG No results for input(s): PHART, PCO2ART, PO2ART in the last 168 hours.  Liver Enzymes Recent Labs  Lab 08/14/17 0648  AST 22  ALT 16  ALKPHOS 66  BILITOT 0.6  ALBUMIN 3.9    Cardiac Enzymes No results for input(s): TROPONINI, PROBNP in the last 168 hours.  Glucose Recent Labs  Lab 08/18/17 0816 08/18/17 1206 08/18/17 1609 08/18/17 1946 08/18/17 2318 08/19/17 0336  GLUCAP 128* 137* 164* 207* 187* 202*    Imaging Ct Head Wo Contrast  Result Date: 08/19/2017 CLINICAL DATA:  Follow-up examination for intracranial hemorrhage. EXAM: CT HEAD WITHOUT CONTRAST TECHNIQUE: Contiguous axial images were obtained from the base of the skull through the vertex without intravenous contrast. COMPARISON:  Prior CT from 08/18/2017 FINDINGS: Brain: Postoperative changes from prior left frontotemporal craniotomy for surgical clipping of left P com aneurysm again seen. Continued interval decrease in blood products at the anterior left temporal lobe, now only faintly visible. Extra-axial collection overlying the left frontotemporal convexity is unchanged without significant mass effect. Small volume subdural hemorrhage along the left tentorium relatively unchanged as well. 5 mm of left-to-right midline shift is stable. Right frontal approach ventriculostomy catheter in place with tip in the right lateral ventricle. Intraventricular hemorrhage is slightly decreased from previous. Stable ventricular size.  No hydrocephalus or ventricular trapping. Evolving subacute infarct at the lateral left thalamus/posterior limb of the left internal capsule with inferior extension again noted, stable. No new intracranial hemorrhage or large vessel territory infarct. No other new acute intracranial abnormality. Atrophy with chronic small vessel ischemic changes again noted. Vascular: Aneurysm clip in the region of the left posterior communicating artery. No  hyperdense vessel. Skull: Postoperative changes from left frontotemporal craniotomy. Skin staples remain in place. Overlying scalp edema and swelling is increased from previous. Sinuses/Orbits: No acute finding about the globes and orbital soft tissues. Paranasal sinuses remain clear. Nasogastric tube partially visualized. No significant mastoid effusion. Other: None. IMPRESSION: 1. Postoperative changes from prior left frontotemporal craniotomy for surgical clipping of left P com aneurysm and left temporal hematoma evacuation. Hemorrhage at the anterior left temporal lobe continues to decrease. 5 mm of left-to-right shift stable from previous. 2. Right frontal approach ventriculostomy in place with continued interval decrease in intraventricular hemorrhage. No hydrocephalus or ventricular trapping. 3. Unchanged small volume subdural hemorrhage along the left tentorium. 4. Continued interval evolution of subacute ischemic infarct at the lateral left thalamus/posterior limb of the left internal capsule. 5. No other new acute intracranial abnormality. Electronically Signed   By: Rise Mu M.D.   On: 08/19/2017 06:22   Dg Chest Port 1 View  Result Date: 08/18/2017 CLINICAL DATA:  Patient with fever. EXAM: PORTABLE CHEST 1 VIEW COMPARISON:  Chest radiograph 08/17/2017 FINDINGS: Enteric tube courses inferior to the diaphragm. Stable cardiac and mediastinal contours. No consolidative pulmonary opacities. No pleural effusion or pneumothorax. IMPRESSION: No acute cardiopulmonary process. Electronically Signed   By: Annia Belt M.D.   On: 08/18/2017 13:39     STUDIES:  CT Head 5/17 >> increasing hypoattenuation within the left medial temporal lobe extending to left posterior lentiform nucleus & posterior limb of internal capsule consistent with developing early subacute infarction.  Mild decrease in intraventricular hemorrhage, decreased mass effect at 7mm, decreased air CTA Head 5/17 >> no findings of  vasospasm, no new large vessel occlusion, left posterior communicating artery aneurysm clip Echo 5/17 >> EF 65 to 70%, grade 1 DD, mild MR, mod TR, PAS 48 mmHg  CULTURES:  ANTIBIOTICS:  SIGNIFICANT EVENTS: 5/15 Clipping PCOM aneurysm and clot evacuation.  2 units PRBC's intra-op  LINES/TUBES: EVD 5/15 >>  A-Line 5/15 >> out ETT 5/15 >> 5/17  DISCUSSION: No issues after extubation 5/17.  Neurosurgery planning for f/u CT head 5/19.  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  Mental status remains tenuous but currently protecting airway.  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 R hemiplegia  PLAN -  Per neuro, nsgy  Continue nimodipine  EVD draining  F/u CT stable/improving    Hypertension. PLAN -  Off cardene  goal SBP < 160 PRN labetalol  Continue per tube metoprolol   Hypokalemia. Hypernatremia  PLAN -  KVO IVF  Add low dose free water 5/20 F/u chem in am  Replete K PRN   Hyperglycemia. PLAN -  SSI   Anemia of critical illness. - stable.  No s/s bleeding  PLAN -  F/u CBC  SCD's    DVT prophylaxis - SCD SUP - not indicated Nutrition - tube feeds Goals of care - full code   No family at bedside  5/20Dirk Dress, NP 08/19/2017  9:20 AM Pager: [phone] or [phone]

## 2017-08-19 NOTE — Progress Notes (Signed)
  NEUROSURGERY PROGRESS NOTE   Pt seen and examined. No issues overnight.   EXAM: Temp:  [98.3 F (36.8 C)-102.5 F (39.2 C)] 101.7 F (38.7 C) (05/20 1250) Pulse Rate:  [81-113] 100 (05/20 1200) Resp:  [19-27] 25 (05/20 1200) BP: (113-176)/(66-100) 156/96 (05/20 1200) SpO2:  [97 %-100 %] 100 % (05/20 1200) Weight:  [64.8 kg (142 lb 13.7 oz)] 64.8 kg (142 lb 13.7 oz) (05/20 0500) Intake/Output      05/19 0701 - 05/20 0700 05/20 0701 - 05/21 0700   I.V. (mL/kg) 1327.9 (20.5) 137.5 (2.1)   NG/GT 1200 450   Total Intake(mL/kg) 2527.9 (39) 587.5 (9.1)   Urine (mL/kg/hr) 2000 (1.3)    Drains 73 20   Stool 250    Total Output 2323 20   Net +204.9 +567.5        Urine Occurrence 4 x    Stool Occurrence 1 x     Awake, alert Does say she feels "okay." CN grossly intact Follows commands, >antigravity RUE/RLE W/D RLE, trace w/d RUE Wound c/d/i  LABS: Lab Results  Component Value Date   CREATININE 0.57 08/19/2017   BUN 10 08/19/2017   NA 148 (H) 08/19/2017   K 3.4 (L) 08/19/2017   CL 114 (H) 08/19/2017   CO2 22 08/19/2017   Lab Results  Component Value Date   WBC 15.5 (H) 08/19/2017   HGB 10.3 (L) 08/19/2017   HCT 32.1 (L) 08/19/2017   MCV 91.2 08/19/2017   PLT 289 08/19/2017    IMAGING: CTH this am reviewed, stable. Improved IVH, No HCP.  TCD: Date POD PCO2 HCT BP  MCA ACA PCA OPHT SIPH VERT Basilar  5/17 RS     Right  Left   72  49   -59     34  42  32              5/20 GC     Right  Left   59  49   -23     - IMPRESSION: - 62 y.o. 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

## 2017-08-19 NOTE — Evaluation (Signed)
Occupational Therapy Evaluation Patient Details Name: Carla Bauer MRN: [id number] DOB: 1955-08-30 Today's Date: 08/19/2017    History of Present Illness pt is a 62 y/o female with no significant medical history admitted with R sided weakness.  Pt s/p L crani for aneurysm clipping, evacuation of left temporal hematoma and placement of right frontal ventricular drain.  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 She demonstrates the below listed deficits and will benefit from continued OT to maximize safety and independence with BADLs.  Pt presents to OT with Rt neglect, Rt hemiparesis, impaired vision, impaired communication, impaired balance, and cognitive deficits.  She currently requires max - total A for ADLs and max A +2 for functional transfers.  She lived with her daughter and was independent PTA.  Feel she will benefit from CIR at discharge.       Follow Up Recommendations  CIR;Supervision/Assistance - 24 hour    Equipment Recommendations  None recommended by OT    Recommendations for Other Services Rehab consult     Precautions / Restrictions Precautions Precautions: Fall      Mobility Bed Mobility Overal bed mobility: Needs Assistance Bed Mobility: Rolling;Sidelying to Sit Rolling: Max assist Sidelying to sit: Max assist;+2 for physical assistance(to max assist)       General bed mobility comments: cued to push up with L UE and pt did assist with additional truncal assist given  Transfers Overall transfer level: Needs assistance Equipment used: 2 person hand held assist Transfers: Sit to/from [employer] Sit to Stand: Max assist;+2 physical assistance   Squat pivot transfers: Max assist;+2 physical assistance     General transfer comment: cues for direction.  Assist of 2 to help pt forward and upright.  Significant  truncal and R LE assist throughout squat transfer.    Balance Overall balance assessment: Needs assistance Sitting-balance support: Bilateral upper extremity supported Sitting balance-Leahy Scale: Poor Sitting balance - Comments: Worked EOB on upright sitting balance and w/shifting L into midline for >5 min   Standing balance support: Bilateral upper extremity supported Standing balance-Leahy Scale: Poor                             ADL either performed or assessed with clinical judgement   ADL Overall ADL's : Needs assistance/impaired Eating/Feeding: NPO   Grooming: Wash/dry hands;Wash/dry face;Maximal assistance;Sitting   Upper Body Bathing: Total assistance;Sitting   Lower Body Bathing: Total assistance;Sit to/from stand   Upper Body Dressing : Total assistance;Sitting   Lower Body Dressing: Total assistance;Sit to/from stand   Toilet Transfer: Maximal assistance;+2 for physical assistance;BSC   Toileting- Clothing Manipulation and Hygiene: Total assistance;Sit to/from stand       Functional mobility during ADLs: Maximal assistance;+2 for physical assistance       Vision   Additional Comments: Pt unable to participate in formal assessment.  She demonstrates Lt gaze preference.  She will turn head to midline with max stimuli on the Rt.  Max A to look to the Rt.  Ptosis noted Lt eye.  Eyes dysconjugate      Perception Perception Perception Tested?: Yes Perception Deficits: Inattention/neglect Inattention/Neglect: Does not attend to right visual field;Does not attend to right side of body   Praxis Praxis Praxis tested?: Deficits Deficits: Initiation;Organization    Pertinent Vitals/Pain Pain Assessment: Faces Faces Pain Scale: No  hurt     Hand Dominance Right   Extremity/Trunk Assessment Upper Extremity Assessment Upper Extremity Assessment: RUE deficits/detail RUE Deficits / Details: appears flaccid  RUE Sensation: decreased proprioception RUE  Coordination: decreased fine motor;decreased gross motor   Lower Extremity Assessment Lower Extremity Assessment: Defer to PT evaluation RLE Deficits / Details: As she became more alert, pt spontaneously moved left LE around.  She was able to bear full weight during standing. LLE Deficits / Details: associated movements only to cough otherwise PROM was stiff LLE Coordination: decreased fine motor;decreased gross motor   Cervical / Trunk Assessment Cervical / Trunk Assessment: Other exceptions(head/neck rotated to the Ltdecreased trunk activation on Rt )   Communication Communication Communication: No difficulties   Cognition Arousal/Alertness: Lethargic;Awake/alert Behavior During Therapy: Flat affect;WFL for tasks assessed/performed Overall Cognitive Status: Impaired/Different from baseline Area of Impairment: Orientation;Attention;Memory;Following commands;Awareness;Safety/judgement;Problem solving                   Current Attention Level: Focused   Following Commands: Follows one step commands inconsistently Safety/Judgement: Decreased awareness of safety;Decreased awareness of deficits Awareness: Intellectual Problem Solving: Slow processing;Requires verbal cues;Requires tactile cues;Difficulty sequencing;Decreased initiation General Comments: Pt with signficant communication deficits.  She will follow some one step motor commands with multi modal cues.  She will give one word responses occasionally.     General Comments  BP sitting 146/90, VSS    Exercises     Shoulder Instructions      Home Living Family/patient expects to be discharged to:: Private residence Living Arrangements: Children Available Help at Discharge: Family;Available 24 hours/day Type of Home: House Home Access: Level entry     Home Layout: One level                   Additional Comments: Pt lives with daughter who works 7-3, and her children who are young.  daughter reports that pt  will have family or friends assist her at discharge while she is at work, but isn't able to state exactly who that is.  The other daughter became tearful and stated "I need to have a say in what happens... she's my mother, and I am the oldest"      Prior Functioning/Environment Level of Independence: Independent                 OT Problem List: Decreased strength;Decreased range of motion;Decreased activity tolerance;Impaired balance (sitting and/or standing);Impaired vision/perception;Decreased coordination;Decreased cognition;Decreased safety awareness;Decreased knowledge of use of DME or AE;Cardiopulmonary status limiting activity;Impaired sensation;Impaired tone;Impaired UE functional use      OT Treatment/Interventions:      OT Goals(Current goals can be found in the care plan section) Acute Rehab OT Goals Patient Stated Goal: pt unable to state.  pt's children interested in rehab. OT Goal Formulation: With family Time For Goal Achievement: 09/02/17 Potential to Achieve Goals: Good ADL Goals Pt Will Perform Grooming: with mod assist;sitting Pt Will Perform Upper Body Bathing: with mod assist;sitting Pt Will Transfer to Toilet: with mod assist;squat pivot transfer;bedside commode Additional ADL Goal #1: Pt will locate items at midline with min cues during ADL activiites Additional ADL Goal #2: Family will be independent with PROM and positioning of Rt UE  OT Frequency:     Barriers to D/C:            Co-evaluation PT/OT/SLP Co-Evaluation/Treatment: Yes Reason for Co-Treatment: Complexity of the patient's impairments (multi-system involvement);For patient/therapist safety;Necessary to address cognition/behavior during functional activity;To address functional/ADL transfers  AM-PAC PT "6 Clicks" Daily Activity     Outcome Measure Help from another person eating meals?: Total Help from another person taking care of personal grooming?: A Lot Help from another  person toileting, which includes using toliet, bedpan, or urinal?: Total Help from another person bathing (including washing, rinsing, drying)?: Total Help from another person to put on and taking off regular upper body clothing?: Total Help from another person to put on and taking off regular lower body clothing?: Total 6 Click Score: 7   End of Session Nurse Communication: Mobility status;Need for lift equipment  Activity Tolerance: Patient tolerated treatment well Patient left: in chair;with call bell/phone within reach;with chair alarm set;with family/visitor present  OT Visit Diagnosis: Unsteadiness on feet (R26.81);Cognitive communication deficit (R41.841);Hemiplegia and hemiparesis Symptoms and signs involving cognitive functions: Cerebral infarction;Nontraumatic SAH Hemiplegia - Right/Left: Right Hemiplegia - dominant/non-dominant: Dominant Hemiplegia - caused by: Nontraumatic SAH;Cerebral infarction                Time: 8657-8469 OT Time Calculation (min): 41 min Charges:  OT General Charges $OT Visit: 1 Visit OT Evaluation $OT Eval High Complexity: 1 High G-Codes:     [employer], OTR/L [phone]   Jeani Hawking M 08/19/2017, 12:58 PM

## 2017-08-19 NOTE — Progress Notes (Addendum)
STROKE TEAM PROGRESS NOTE   INTERVAL HISTORY Pt RN is at the bedside. More awake alert. However, not able to tolerate EVD clamp, it was re-opened yesterday afternoon. CT head stable this am. Able to follow some simple commands and able to repeat. However, continue to have fever.     Vitals:   08/19/17 1200 08/19/17 1250 08/19/17 1300 08/19/17 1400  BP: (!) 156/96  (!) 141/89 (!) 151/88  Pulse: 100  97 96  Resp: (!) 25  (!) 21 (!) 22  Temp:  (!) 101.7 F (38.7 C)    TempSrc:  Oral    SpO2: 100%  100% 100%  Weight:      Height:        CBC:  Recent Labs  Lab 08/16/17 0316 08/17/17 0454 08/18/17 0241 08/19/17 1048  WBC 19.9* 19.3* 16.8* 15.5*  NEUTROABS 16.1* 15.0*  --   --   HGB 9.0* 9.2* 10.2* 10.3*  HCT 27.3* 27.1* 30.6* 32.1*  MCV 90.1 87.7 89.5 91.2  PLT 220 227 269 289    Basic Metabolic Panel:  Recent Labs  Lab 08/16/17 0316  08/18/17 0241 08/19/17 0930  NA 145   < > 149* 148*  K 3.0*   < > 3.5 3.4*  CL 114*   < > 115* 114*  CO2 23   < > 23 22  GLUCOSE 159*   < > 189* 190*  BUN 5*   < > 8 10  CREATININE 0.57   < > 0.65 0.57  CALCIUM 8.6*   < > 9.7 9.5  MG 2.0  --  1.9  --    < > = values in this interval not displayed.   Lipid Panel:     Component Value Date/Time   CHOL 120 08/16/2017 0316   TRIG 77 08/16/2017 0316   HDL 53 08/16/2017 0316   CHOLHDL 2.3 08/16/2017 0316   VLDL 15 08/16/2017 0316   LDLCALC 52 08/16/2017 0316   HgbA1c:  Lab Results  Component Value Date   HGBA1C 6.0 (H) 08/15/2017   Urine Drug Screen:     Component Value Date/Time   LABOPIA NONE DETECTED 08/14/2017 0644   COCAINSCRNUR NONE DETECTED 08/14/2017 0644   LABBENZ NONE DETECTED 08/14/2017 0644   AMPHETMU NONE DETECTED 08/14/2017 0644   THCU NONE DETECTED 08/14/2017 0644   LABBARB NONE DETECTED 08/14/2017 0644    Alcohol Level     Component Value Date/Time   ETH <10 08/14/2017 0647    IMAGING I have personally reviewed the radiological images below and agree  with the radiology interpretations.  Ct Angio Head W Or Wo Contrast  Result Date: 08/16/2017 CLINICAL DATA:  62 y/o F; cerebral aneurysm and subarachnoid hemorrhage. Evaluation for cerebral vasospasm. EXAM: CT ANGIOGRAPHY HEAD TECHNIQUE: Multidetector CT imaging of the head was performed using the standard protocol during bolus administration of intravenous contrast. Multiplanar CT image reconstructions and MIPs were obtained to evaluate the vascular anatomy. CONTRAST:  50 cc Isovue 370 COMPARISON:  08/14/2017 CT angiogram of the head and CT of the head. FINDINGS: CT HEAD Brain: Mild interval decrease in volume of intraventricular hemorrhage. Stable subdural collection over the left anterior frontal convexity subjacent to frontal and temporal craniotomy. Near complete resolution of pneumocephalus. Decreased mass effect with 7 mm of left-to-right midline shift, previously 9 mm. Increased hypoattenuation within the left medial temporal lobe extending into the left posterior lentiform nucleus and posterior limb of internal capsule likely representing evolving early subacute infarction. No new  acute intracranial hemorrhage or herniation. Right frontal approach ventriculostomy catheter is stable in position. No hydrocephalus. Vascular: As below. Skull: Postsurgical changes related to a left frontal and temporal craniotomy with mildly increased edema, but decreased air in the overlying scalp. Sinuses: Imaged portions are clear. Orbits: No acute finding. CTA HEAD Anterior circulation: No significant stenosis, proximal occlusion, new aneurysm, or vascular malformation. Clips left posterior communicating artery aneurysm without residual or recurrent aneurysm identified. Posterior circulation: No significant stenosis, proximal occlusion, aneurysm, or vascular malformation. Venous sinuses: As permitted by contrast timing, patent. Anatomic variants: None significant. Delayed phase: No abnormal intracranial enhancement.  IMPRESSION: CT head: 1. Increasing hypoattenuation within the left medial temporal lobe extending to left posterior lentiform nucleus and posterior limb of internal capsule compatible developing early subacute infarction. 2. Mild decrease in volume of intraventricular hemorrhage. 3. Stable subdural collection over the left anterior frontal convexity subjacent to the left frontal and temporal craniotomy. 4. Decreased mass effect with 7 mm of left-to-right midline shift, previously 9 mm. CTA head: 1. No findings of vasospasm. No new large vessel occlusion, aneurysm, or significant stenosis. 2. Left posterior communicating artery aneurysm clip. No residual or recurrent aneurysm identified. Electronically Signed   By: Mitzi Hansen M.D.   On: 08/16/2017 02:20   Ct Angio Head W Or Wo Contrast  Result Date: 08/14/2017 CLINICAL DATA:  Intracranial hemorrhage EXAM: CT ANGIOGRAPHY HEAD AND NECK TECHNIQUE: Multidetector CT imaging of the head and neck was performed using the standard protocol during bolus administration of intravenous contrast. Multiplanar CT image reconstructions and MIPs were obtained to evaluate the vascular anatomy. Carotid stenosis measurements (when applicable) are obtained utilizing NASCET criteria, using the distal internal carotid diameter as the denominator. CONTRAST:  50 mL ISOVUE-370 IOPAMIDOL (ISOVUE-370) INJECTION 76% COMPARISON:  CT head 08/14/2017 FINDINGS: CTA NECK FINDINGS Aortic arch: Normal aortic arch and proximal great vessels. Right carotid system: Right carotid system widely patent. Bifurcation widely patent. Left carotid system: Left carotid system widely patent. Left carotid bifurcation patent. Vertebral arteries: Both vertebral arteries widely patent without stenosis or irregularity. Skeleton: Degenerative changes in the cervical spine with spondylosis and anterolisthesis C3-4. No acute skeletal abnormality. Dental caries. Other neck: Negative for mass or adenopathy  in the neck. Upper chest: Visualized lung apices clear. Review of the MIP images confirms the above findings CTA HEAD FINDINGS Anterior circulation: Right cavernous carotid widely patent. Right anterior and middle cerebral arteries widely patent Left cavernous carotid widely patent. 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 This apex extends into the left temporal hematoma. Hematoma felt to be due to rupture of this aneurysm. Left anterior and middle cerebral arteries widely patent without stenosis or vasospasm. Posterior circulation: Both vertebral arteries patent to the basilar. Basilar widely patent. Right PICA patent. Left PICA not visualized. Dominant left AICA. Superior cerebellar and posterior cerebral arteries patent bilaterally without stenosis. No aneurysm in the posterior circulation. Venous sinuses: Normal venous enhancement Anatomic variants: None Delayed phase: Left temporal hematoma unchanged. Subdural hematoma left frontal lobe and along the tentorium on the left also unchanged. Intraventricular hemorrhage unchanged. Mild hydrocephalus. 11 mm midline shift to the right unchanged. No new hemorrhage since the CT earlier today. 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 Extensive intracranial hemorrhage is stable from earlier this morning.  No other aneurysm. No significant atherosclerotic disease. These results were called by telephone at the time of interpretation on 08/14/2017 at 10:37 am to Dr. Conchita Paris , who verbally acknowledged these results. Electronically Signed   By: Marlan Palau M.D.   On: 08/14/2017 10:37   Ct Head Wo Contrast  Result Date: 08/14/2017 CLINICAL DATA:  62 y/o  F; 08/14/2017 CT head and CTA head. EXAM: CT HEAD WITHOUT  CONTRAST TECHNIQUE: Contiguous axial images were obtained from the base of the skull through the vertex without intravenous contrast. COMPARISON:  08/14/2017 CT head and CTA head. FINDINGS: Brain: Interval left frontal and temporal craniotomy postsurgical changes with air and edema in the overlying scalp. Small volume pneumocephalus overlying the frontal lobes. Interval placement of a left posterior communicating artery aneurysm clip in the left suprasellar cistern. Interval evacuation of hematoma within the left anterior temporal lobe with small residual fluid-filled cavity. Stable volume of intraventricular hemorrhage within the left greater than right lateral ventricles, third ventricle, and fourth ventricle. Stable small volume of hemorrhage overlying the left tentorium cerebelli. Interval placement of a right frontal approach ventriculostomy catheter with tip in the frontal horn of right lateral ventricle. Stable ventricle size. Decreased left-to-right midline shift of 9 mm, previously 11 mm. No downward herniation. Vascular: Interval placement of left suprasellar cistern aneurysm clips. Skull: Postsurgical changes related to left frontal and temporal craniotomy with air and edema in the overlying scalp as well as skin staples. Sinuses/Orbits: No acute finding. Other: None. IMPRESSION: 1. Interval left frontal and temporal craniotomy, left anterior temporal lobe hematoma evacuation, right frontal approach ventriculostomy catheter, and clipping of left posterior communicating artery aneurysm. 2. Associated postsurgical changes with air and edema in the overlying scalp and small volume of pneumocephalus. 3. Stable residual intraventricular hemorrhage and subdural along the left tentorium cerebelli. 4. No new acute intracranial abnormality. 5. Decreased mass effect with 9 mm left-to-right midline shift, previously 11 mm. Electronically Signed   By: Mitzi Hansen M.D.   On: 08/14/2017 23:20   Ct Angio  Neck W Or Wo Contrast  Result Date: 08/14/2017 CLINICAL DATA:  Intracranial hemorrhage EXAM: CT ANGIOGRAPHY HEAD AND NECK TECHNIQUE: Multidetector CT imaging of the head and neck was performed using the standard protocol during bolus administration of intravenous contrast. Multiplanar CT image reconstructions and MIPs were obtained to evaluate the vascular anatomy. Carotid stenosis measurements (when applicable) are obtained utilizing NASCET criteria, using the distal internal carotid diameter as the denominator. CONTRAST:  50 mL ISOVUE-370 IOPAMIDOL (ISOVUE-370) INJECTION 76% COMPARISON:  CT head 08/14/2017 FINDINGS: CTA NECK FINDINGS Aortic arch: Normal aortic arch and proximal great vessels. Right carotid system: Right carotid system widely patent. Bifurcation widely patent. Left carotid system: Left carotid system widely patent. Left carotid bifurcation patent. Vertebral arteries: Both vertebral arteries widely patent without stenosis or irregularity. Skeleton: Degenerative changes in the cervical spine with spondylosis and anterolisthesis C3-4. No acute skeletal abnormality. Dental caries. Other neck: Negative for mass or adenopathy in the neck. Upper chest: Visualized lung apices clear. Review of the MIP images confirms the above findings CTA HEAD FINDINGS Anterior circulation: Right cavernous carotid widely patent. Right anterior and middle cerebral arteries widely patent Left cavernous carotid widely patent. 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 This apex extends into the left temporal hematoma. Hematoma felt to be due to rupture of this aneurysm. Left anterior and middle cerebral arteries widely patent without stenosis or vasospasm.  Posterior circulation: Both vertebral arteries patent to the basilar. Basilar widely patent. Right PICA patent. Left PICA not visualized. Dominant left AICA. Superior cerebellar and posterior  cerebral arteries patent bilaterally without stenosis. No aneurysm in the posterior circulation. Venous sinuses: Normal venous enhancement Anatomic variants: None Delayed phase: Left temporal hematoma unchanged. Subdural hematoma left frontal lobe and along the tentorium on the left also unchanged. Intraventricular hemorrhage unchanged. Mild hydrocephalus. 11 mm midline shift to the right unchanged. No new hemorrhage since the CT earlier today. 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 Extensive intracranial hemorrhage is stable from earlier this morning. No other aneurysm. No significant atherosclerotic disease. These results were called by telephone at the time of interpretation on 08/14/2017 at 10:37 am to Dr. Conchita Paris , who verbally acknowledged these results. Electronically Signed   By: Marlan Palau M.D.   On: 08/14/2017 10:37   Dg Chest Port 1 View  Result Date: 08/17/2017 CLINICAL DATA:  Acute respiratory failure with hypoxia EXAM: PORTABLE CHEST 1 VIEW COMPARISON:  None. FINDINGS: Nasoenteric feeding tube at least reaches the stomach. Extensive artifact from EKG leads. Linear opacity at the right base favoring atelectasis. There is low volumes with interstitial crowding. No Kerley lines, effusion, or pneumothorax. Borderline cardiomegaly, accentuated by technique. IMPRESSION: Low volumes with mild atelectasis on the right. Electronically Signed   By: Marnee Spring M.D.   On: 08/17/2017 08:18   TTE  - Left ventricle: The cavity size was normal. Systolic function was   vigorous. The estimated ejection fraction was in the range of 65%   to 70%. Wall motion was normal; there were no regional wall   motion abnormalities. Doppler parameters are consistent with   abnormal left ventricular relaxation (grade 1 diastolic    dysfunction). There was no evidence of elevated ventricular   filling pressure by Doppler parameters. - Aortic valve: Trileaflet; normal thickness leaflets. Valve area   (VTI): 2.01 cm^2. Valve area (Vmax): 2.18 cm^2. Valve area   (Vmean): 2.06 cm^2. - Aortic root: The aortic root was normal in size. - Mitral valve: There was mild regurgitation. - Right ventricle: Systolic function was normal. - Right atrium: The atrium was normal in size. - Tricuspid valve: There was moderate regurgitation. - Pulmonic valve: There was no regurgitation. - Pulmonary arteries: Systolic pressure was moderately increased.   PA peak pressure: 48 mm Hg (S). - Inferior vena cava: The vessel was normal in size. - Pericardium, extracardiac: There was no pericardial effusion.  Ct Head Wo Contrast  Result Date: 08/19/2017 CLINICAL DATA:  Follow-up examination for intracranial hemorrhage. EXAM: CT HEAD WITHOUT CONTRAST TECHNIQUE: Contiguous axial images were obtained from the base of the skull through the vertex without intravenous contrast. COMPARISON:  Prior CT from 08/18/2017 FINDINGS: Brain: Postoperative changes from prior left frontotemporal craniotomy for surgical clipping of left P com aneurysm again seen. Continued interval decrease in blood products at the anterior left temporal lobe, now only faintly visible. Extra-axial collection overlying the left frontotemporal convexity is unchanged without significant mass effect. Small volume subdural hemorrhage along the left tentorium relatively unchanged as well. 5 mm of left-to-right midline shift is stable. Right frontal approach ventriculostomy catheter in place with tip in the right lateral ventricle. Intraventricular hemorrhage is slightly decreased from previous. Stable ventricular size. No hydrocephalus or ventricular trapping. Evolving subacute infarct at the lateral left  thalamus/posterior limb of the left internal capsule with inferior extension again noted,  stable. No new intracranial hemorrhage or large vessel territory infarct. No other new acute intracranial abnormality. Atrophy with chronic small vessel ischemic changes again noted. Vascular: Aneurysm clip in the region of the left posterior communicating artery. No hyperdense vessel. Skull: Postoperative changes from left frontotemporal craniotomy. Skin staples remain in place. Overlying scalp edema and swelling is increased from previous. Sinuses/Orbits: No acute finding about the globes and orbital soft tissues. Paranasal sinuses remain clear. Nasogastric tube partially visualized. No significant mastoid effusion. Other: None. IMPRESSION: 1. Postoperative changes from prior left frontotemporal craniotomy for surgical clipping of left P com aneurysm and left temporal hematoma evacuation. Hemorrhage at the anterior left temporal lobe continues to decrease. 5 mm of left-to-right shift stable from previous. 2. Right frontal approach ventriculostomy in place with continued interval decrease in intraventricular hemorrhage. No hydrocephalus or ventricular trapping. 3. Unchanged small volume subdural hemorrhage along the left tentorium. 4. Continued interval evolution of subacute ischemic infarct at the lateral left thalamus/posterior limb of the left internal capsule. 5. No other new acute intracranial abnormality. Electronically Signed   By: Rise Mu M.D.   On: 08/19/2017 06:22   Dg Chest Port 1 View  Result Date: 08/19/2017 CLINICAL DATA:  Fever EXAM: PORTABLE CHEST 1 VIEW COMPARISON:  Portable exam 1208 hours compared to 08/18/2017 FINDINGS: Feeding tube traverses esophagus into stomach. Normal heart size, mediastinal contours, and pulmonary vascularity. Mild bibasilar atelectasis. Lungs otherwise clear. No infiltrate, pleural effusion or pneumothorax. Bones unremarkable. IMPRESSION: Mild bibasilar atelectasis. Electronically Signed   By: Ulyses Southward M.D.   On: 08/19/2017 12:32   TCD 08/16/17 - no  vasospasm  TCD pending   PHYSICAL EXAM Temp:  [98.3 F (36.8 C)-102.5 F (39.2 C)] 101.7 F (38.7 C) (05/20 1250) Pulse Rate:  [81-113] 96 (05/20 1400) Resp:  [19-27] 22 (05/20 1400) BP: (113-170)/(66-100) 151/88 (05/20 1400) SpO2:  [97 %-100 %] 100 % (05/20 1400) Weight:  [142 lb 13.7 oz (64.8 kg)] 142 lb 13.7 oz (64.8 kg) (05/20 0500)  General - Well nourished, well developed, lethargic  Ophthalmologic - Fundi not visualized due to noncooperation.  Cardiovascular - Regular rate and rhythm.  Neuro - lethargic, but more awake alert than yesterday, eyes open on the left and with ptosis on the right. Left pupil 3.40mm and right pupil 3mm, reactive to light bilaterally, left gaze preference and not able to cross midline. Blinking to visual threat to the left but not to the right. Right facial droop and tongue midline. Pt was  able to follow limited simple command and able to repeat simple sentences. LUE 4/5, LLE 3-/5, RUE 1/5 on pain, and RLE 2/5 on pain stimulation. DTR 1+ and no babinski. Sensation, coordination not cooperative and gait not tested.   ASSESSMENT/PLAN Ms. MYELLE POTEAT is a 62 y.o. female with no significant history found having fallen out of the bed with R arm weakness and altered mental status. 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  Left temporal lobe ICH w IVH/SAH/SDH, d/t L PCOM aneurysm rupture s/p clipping, EVD and hematoma evacuation  CT head L mesial temporal hmg 12 cc w/ IVH. Surrounding edema with L to R shift 11mm. 6mm SDH along L convexity. ASPECTS 10.   CTA head & neck LPCom aneurysm c/w rupture into L temporal lobe. Extensive hmg stable from earlier.  S/p crani w/ clipping of  L PCom aneurysm, hematoma evacutaion and EVD placement 5/15 (Nundkumar)  CT head repeat interval L frontal and temp crani, L ant temporal lobe hematoma evac w/ R fronal appoach, clip L PCom. Post OR  changes w/ air and edema. Stable IVH and SDH. No new abn. Decreased mass effect to 9mm L to R  CTA repeat showed left PCOM s/p clip and left temporal ICH evacuation, however, new left thalamus and left optic radiation infarcts  CT repeat x 2 stable hematoma and no hydro  TCD MWF - 08/16/17 no vasospasm. Pending today   On nimonidpine  On free water and TF /hr  subq heparin for VTE prophylaxis  No antithrombotic prior to admission (Goody Powders prn).   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/PCA/AchA manipulation with PCOM aneurysm clipping  Resultant - right hemiplegia  CTA repeat - patent left PCA and MCA  CT repeat x 2 - stable IVH and no hydro  Consider MRI once off EVD drain and able to go to MRI  Add ASA  for stroke prevention  PT/OT - pending  LDL 52   A1C 6.0  Left ptosis   Left ptosis with mildly larger left pupil  Likely mild injury of left CNIII with left PCOM aneurysm clipping  Respiratory distress, improved  Intubated for crani  extubate 08/16/17, tolerating well   CXR mild bibasilar atelectasis  On mucomyst nebs  PCCM following  Hydrocephalus  S/p EVD  Not tolerating EVD clamping  EVD drain 14cc/h  CT stable  Per NSG, will again clamp EVD later this week  Fever  WBC 12.5->19.9->19.3->16.8->15.5  Tmax 100.5->101.9->102.5  UA neg - UA repeat WBC 6-10  Blood culture pending  Consider CSF sampling if needed  CXR mild bibasilar atelectasis  Management per CCM  Tachycardia - on metoprolol  bid  Blood pressure  controlled  SBP goal < 160  off cardene drip  Labetalol PRN  Other Stroke Risk Factors  No family hx aneurysms  Other Active Problems  Hypokalemia 3.3 -> 3.0->2.8->3.5->3.4, supplement   Hospital day # 5  This patient is critically ill due to ICH, SAH, IVH, PCOM aneurysm, s/p hematoma  evacuation, EVD, fever, leukocytosis and aneurysm clipping and at significant risk of neurological worsening, death form re-bleed, hydrocephalus, seizure, cerebral edema and brain herniation. This patient's care requires constant monitoring of vital signs, hemodynamics, respiratory and cardiac monitoring, review of multiple databases, neurological assessment, discussion with family, other specialists and medical decision making of high complexity. I spent 40 minutes of neurocritical care time in the care of this patient.  Marvel Plan, MD PhD Stroke Neurology 08/19/2017 2:42 PM    To contact Stroke Continuity provider, please refer to [email or web address]. After hours, contact General Neurology

## 2017-08-19 NOTE — Progress Notes (Signed)
Transcranial Doppler  Date POD PCO2 HCT BP  MCA ACA PCA OPHT SIPH VERT Basilar  5/17 RS     Right  Left   72  49   -59     34  42  32              5/20 GC     Right  Left   59  49   -23     - Right  Left                                             Right  Left                                             Right  Left                                            Right  Left                                            Right  Left                                        MCA = Middle Cerebral Artery      OPHT = Opthalmic Artery     BASILAR = Basilar Artery   ACA = Anterior Cerebral Artery     SIPH = Carotid Siphon PCA = Posterior Cerebral Artery   VERT = Verterbral Artery                   Normal MCA = 62+\-12 ACA = 50+\-12 PCA = 42+\-23   08/19/17 1:50 PM Olen Cordial RVT

## 2017-08-20 ENCOUNTER — Inpatient Hospital Stay (HOSPITAL_COMMUNITY): Payer: Medicaid Other

## 2017-08-20 ENCOUNTER — Other Ambulatory Visit (HOSPITAL_COMMUNITY): Payer: Self-pay

## 2017-08-20 DIAGNOSIS — R0682 Tachypnea, not elsewhere classified: Secondary | ICD-10-CM

## 2017-08-20 DIAGNOSIS — R7303 Prediabetes: Secondary | ICD-10-CM

## 2017-08-20 DIAGNOSIS — D62 Acute posthemorrhagic anemia: Secondary | ICD-10-CM

## 2017-08-20 DIAGNOSIS — D72829 Elevated white blood cell count, unspecified: Secondary | ICD-10-CM

## 2017-08-20 DIAGNOSIS — R509 Fever, unspecified: Secondary | ICD-10-CM

## 2017-08-20 DIAGNOSIS — R000 Tachycardia, unspecified: Secondary | ICD-10-CM

## 2017-08-20 DIAGNOSIS — E876 Hypokalemia: Secondary | ICD-10-CM

## 2017-08-20 DIAGNOSIS — N390 Urinary tract infection, site not specified: Secondary | ICD-10-CM

## 2017-08-20 DIAGNOSIS — G936 Cerebral edema: Secondary | ICD-10-CM

## 2017-08-20 DIAGNOSIS — G935 Compression of brain: Secondary | ICD-10-CM

## 2017-08-20 DIAGNOSIS — I10 Essential (primary) hypertension: Secondary | ICD-10-CM

## 2017-08-20 LAB — CBC
HEMATOCRIT: 33.1 % — AB (ref 36.0–46.0)
Hemoglobin: 10.6 g/dL — ABNORMAL LOW (ref 12.0–15.0)
MCH: 29.3 pg (ref 26.0–34.0)
MCHC: 32 g/dL (ref 30.0–36.0)
MCV: 91.4 fL (ref 78.0–100.0)
PLATELETS: 300 10*3/uL (ref 150–400)
RBC: 3.62 MIL/uL — ABNORMAL LOW (ref 3.87–5.11)
RDW: 14.1 % (ref 11.5–15.5)
WBC: 15.4 10*3/uL — ABNORMAL HIGH (ref 4.0–10.5)

## 2017-08-20 LAB — BASIC METABOLIC PANEL
Anion gap: 11 (ref 5–15)
BUN: 13 mg/dL (ref 6–20)
CALCIUM: 9.7 mg/dL (ref 8.9–10.3)
CHLORIDE: 112 mmol/L — AB (ref 101–111)
CO2: 24 mmol/L (ref 22–32)
CREATININE: 0.7 mg/dL (ref 0.44–1.00)
GFR calc non Af Amer: >60 mL/min (ref >60)
Glucose, Bld: 189 mg/dL — ABNORMAL HIGH (ref 65–99)
Potassium: 3.3 mmol/L — ABNORMAL LOW (ref 3.5–5.1)
Sodium: 147 mmol/L — ABNORMAL HIGH (ref 135–145)

## 2017-08-20 LAB — GLUCOSE, CAPILLARY
GLUCOSE-CAPILLARY: 179 mg/dL — AB (ref 65–99)
GLUCOSE-CAPILLARY: 174 mg/dL — AB (ref 65–99)
Glucose-Capillary: 166 mg/dL — ABNORMAL HIGH (ref 65–99)
Glucose-Capillary: 201 mg/dL — ABNORMAL HIGH (ref 65–99)
Glucose-Capillary: 233 mg/dL — ABNORMAL HIGH (ref 65–99)
Glucose-Capillary: 286 mg/dL — ABNORMAL HIGH (ref 65–99)

## 2017-08-20 LAB — PROCALCITONIN: Procalcitonin: 0.21 ng/mL

## 2017-08-20 MED ORDER — METOPROLOL TARTRATE 25 MG/10 ML ORAL SUSPENSION
50.0000 mg | Freq: Two times a day (BID) | ORAL | Status: DC
  Administered 2017-08-20 – 2017-09-03 (×28): 50 mg
  Filled 2017-08-20 (×29): qty 20

## 2017-08-20 MED ORDER — POTASSIUM CHLORIDE 20 MEQ PO PACK
20.0000 meq | PACK | Freq: Two times a day (BID) | ORAL | Status: AC
  Administered 2017-08-20 (×2): 20 meq via ORAL
  Filled 2017-08-20 (×2): qty 1

## 2017-08-20 NOTE — Progress Notes (Signed)
STROKE TEAM PROGRESS NOTE   INTERVAL HISTORY Pt RN is at the bedside. Awake, but not as alert as yesterday, not following commands or repeat sentence today. Had low grade fever. Still has tachycardia and tachypnea. But neuro otherwise stable.    Vitals:   08/20/17 0500 08/20/17 0600 08/20/17 0700 08/20/17 0800  BP: (!) 165/101 (!) 168/92 (!) 143/85 (!) 150/84  Pulse: (!) 109 (!) 106 (!) 111 (!) 119  Resp: (!) 24 19 (!) 26 (!) 23  Temp:    100 F (37.8 C)  TempSrc:    Axillary  SpO2: 99% 100% 97% 100%  Weight: 142 lb 3.2 oz (64.5 kg)     Height:        CBC:  Recent Labs  Lab 08/16/17 0316 08/17/17 0454  08/19/17 1048 08/20/17 0352  WBC 19.9* 19.3*   < > 15.5* 15.4*  NEUTROABS 16.1* 15.0*  --   --   --   HGB 9.0* 9.2*   < > 10.3* 10.6*  HCT 27.3* 27.1*   < > 32.1* 33.1*  MCV 90.1 87.7   < > 91.2 91.4  PLT 220 227   < > 289 300   < > = values in this interval not displayed.    Basic Metabolic Panel:  Recent Labs  Lab 08/16/17 0316  08/18/17 0241 08/19/17 0930 08/20/17 0352  NA 145   < > 149* 148* 147*  K 3.0*   < > 3.5 3.4* 3.3*  CL 114*   < > 115* 114* 112*  CO2 23   < > 23 22 24   GLUCOSE 159*   < > 189* 190* 189*  BUN 5*   < > 8 10 13   CREATININE 0.57   < > 0.65 0.57 0.70  CALCIUM 8.6*   < > 9.7 9.5 9.7  MG 2.0  --  1.9  --   --    < > = values in this interval not displayed.   Lipid Panel:     Component Value Date/Time   CHOL 120 08/16/2017 0316   TRIG 77 08/16/2017 0316   HDL 53 08/16/2017 0316   CHOLHDL 2.3 08/16/2017 0316   VLDL 15 08/16/2017 0316   LDLCALC 52 08/16/2017 0316   HgbA1c:  Lab Results  Component Value Date   HGBA1C 6.0 (H) 08/15/2017   Urine Drug Screen:     Component Value Date/Time   LABOPIA NONE DETECTED 08/14/2017 0644   COCAINSCRNUR NONE DETECTED 08/14/2017 0644   LABBENZ NONE DETECTED 08/14/2017 0644   AMPHETMU NONE DETECTED 08/14/2017 0644   THCU NONE DETECTED 08/14/2017 0644   LABBARB NONE DETECTED 08/14/2017 0644     Alcohol Level     Component Value Date/Time   ETH <10 08/14/2017 0647    IMAGING I have personally reviewed the radiological images below and agree with the radiology interpretations.  Ct Angio Head W Or Wo Contrast  Result Date: 08/16/2017 CLINICAL DATA:  62 y/o F; cerebral aneurysm and subarachnoid hemorrhage. Evaluation for cerebral vasospasm. EXAM: CT ANGIOGRAPHY HEAD TECHNIQUE: Multidetector CT imaging of the head was performed using the standard protocol during bolus administration of intravenous contrast. Multiplanar CT image reconstructions and MIPs were obtained to evaluate the vascular anatomy. CONTRAST:  50 cc Isovue 370 COMPARISON:  08/14/2017 CT angiogram of the head and CT of the head. FINDINGS: CT HEAD Brain: Mild interval decrease in volume of intraventricular hemorrhage. Stable subdural collection over the left anterior frontal convexity subjacent to frontal and temporal  craniotomy. Near complete resolution of pneumocephalus. Decreased mass effect with 7 mm of left-to-right midline shift, previously 9 mm. Increased hypoattenuation within the left medial temporal lobe extending into the left posterior lentiform nucleus and posterior limb of internal capsule likely representing evolving early subacute infarction. No new acute intracranial hemorrhage or herniation. Right frontal approach ventriculostomy catheter is stable in position. No hydrocephalus. Vascular: As below. Skull: Postsurgical changes related to a left frontal and temporal craniotomy with mildly increased edema, but decreased air in the overlying scalp. Sinuses: Imaged portions are clear. Orbits: No acute finding. CTA HEAD Anterior circulation: No significant stenosis, proximal occlusion, new aneurysm, or vascular malformation. Clips left posterior communicating artery aneurysm without residual or recurrent aneurysm identified. Posterior circulation: No significant stenosis, proximal occlusion, aneurysm, or vascular  malformation. Venous sinuses: As permitted by contrast timing, patent. Anatomic variants: None significant. Delayed phase: No abnormal intracranial enhancement. IMPRESSION: CT head: 1. Increasing hypoattenuation within the left medial temporal lobe extending to left posterior lentiform nucleus and posterior limb of internal capsule compatible developing early subacute infarction. 2. Mild decrease in volume of intraventricular hemorrhage. 3. Stable subdural collection over the left anterior frontal convexity subjacent to the left frontal and temporal craniotomy. 4. Decreased mass effect with 7 mm of left-to-right midline shift, previously 9 mm. CTA head: 1. No findings of vasospasm. No new large vessel occlusion, aneurysm, or significant stenosis. 2. Left posterior communicating artery aneurysm clip. No residual or recurrent aneurysm identified. Electronically Signed   By: Mitzi Hansen M.D.   On: 08/16/2017 02:20   Ct Angio Head W Or Wo Contrast  Result Date: 08/14/2017 CLINICAL DATA:  Intracranial hemorrhage EXAM: CT ANGIOGRAPHY HEAD AND NECK TECHNIQUE: Multidetector CT imaging of the head and neck was performed using the standard protocol during bolus administration of intravenous contrast. Multiplanar CT image reconstructions and MIPs were obtained to evaluate the vascular anatomy. Carotid stenosis measurements (when applicable) are obtained utilizing NASCET criteria, using the distal internal carotid diameter as the denominator. CONTRAST:  50 mL ISOVUE-370 IOPAMIDOL (ISOVUE-370) INJECTION 76% COMPARISON:  CT head 08/14/2017 FINDINGS: CTA NECK FINDINGS Aortic arch: Normal aortic arch and proximal great vessels. Right carotid system: Right carotid system widely patent. Bifurcation widely patent. Left carotid system: Left carotid system widely patent. Left carotid bifurcation patent. Vertebral arteries: Both vertebral arteries widely patent without stenosis or irregularity. Skeleton: Degenerative  changes in the cervical spine with spondylosis and anterolisthesis C3-4. No acute skeletal abnormality. Dental caries. Other neck: Negative for mass or adenopathy in the neck. Upper chest: Visualized lung apices clear. Review of the MIP images confirms the above findings CTA HEAD FINDINGS Anterior circulation: Right cavernous carotid widely patent. Right anterior and middle cerebral arteries widely patent Left cavernous carotid widely patent. 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 This apex extends into the left temporal hematoma. Hematoma felt to be due to rupture of this aneurysm. Left anterior and middle cerebral arteries widely patent without stenosis or vasospasm. Posterior circulation: Both vertebral arteries patent to the basilar. Basilar widely patent. Right PICA patent. Left PICA not visualized. Dominant left AICA. Superior cerebellar and posterior cerebral arteries patent bilaterally without stenosis. No aneurysm in the posterior circulation. Venous sinuses: Normal venous enhancement Anatomic variants: None Delayed phase: Left temporal hematoma unchanged. Subdural hematoma left frontal lobe and along the tentorium on the left also unchanged. Intraventricular hemorrhage unchanged. Mild hydrocephalus. 11 mm midline shift to the right unchanged. No  new hemorrhage since the CT earlier today. 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 Extensive intracranial hemorrhage is stable from earlier this morning. No other aneurysm. No significant atherosclerotic disease. These results were called by telephone at the time of interpretation on 08/14/2017 at 10:37 am to Dr. Conchita Paris , who verbally acknowledged these results. Electronically Signed   By: Marlan Palau  M.D.   On: 08/14/2017 10:37   Ct Head Wo Contrast  Result Date: 08/14/2017 CLINICAL DATA:  62 y/o  F; 08/14/2017 CT head and CTA head. EXAM: CT HEAD WITHOUT CONTRAST TECHNIQUE: Contiguous axial images were obtained from the base of the skull through the vertex without intravenous contrast. COMPARISON:  08/14/2017 CT head and CTA head. FINDINGS: Brain: Interval left frontal and temporal craniotomy postsurgical changes with air and edema in the overlying scalp. Small volume pneumocephalus overlying the frontal lobes. Interval placement of a left posterior communicating artery aneurysm clip in the left suprasellar cistern. Interval evacuation of hematoma within the left anterior temporal lobe with small residual fluid-filled cavity. Stable volume of intraventricular hemorrhage within the left greater than right lateral ventricles, third ventricle, and fourth ventricle. Stable small volume of hemorrhage overlying the left tentorium cerebelli. Interval placement of a right frontal approach ventriculostomy catheter with tip in the frontal horn of right lateral ventricle. Stable ventricle size. Decreased left-to-right midline shift of 9 mm, previously 11 mm. No downward herniation. Vascular: Interval placement of left suprasellar cistern aneurysm clips. Skull: Postsurgical changes related to left frontal and temporal craniotomy with air and edema in the overlying scalp as well as skin staples. Sinuses/Orbits: No acute finding. Other: None. IMPRESSION: 1. Interval left frontal and temporal craniotomy, left anterior temporal lobe hematoma evacuation, right frontal approach ventriculostomy catheter, and clipping of left posterior communicating artery aneurysm. 2. Associated postsurgical changes with air and edema in the overlying scalp and small volume of pneumocephalus. 3. Stable residual intraventricular hemorrhage and subdural along the left tentorium cerebelli. 4. No new acute intracranial abnormality. 5. Decreased  mass effect with 9 mm left-to-right midline shift, previously 11 mm. Electronically Signed   By: Mitzi Hansen M.D.   On: 08/14/2017 23:20   Ct Angio Neck W Or Wo Contrast  Result Date: 08/14/2017 CLINICAL DATA:  Intracranial hemorrhage EXAM: CT ANGIOGRAPHY HEAD AND NECK TECHNIQUE: Multidetector CT imaging of the head and neck was performed using the standard protocol during bolus administration of intravenous contrast. Multiplanar CT image reconstructions and MIPs were obtained to evaluate the vascular anatomy. Carotid stenosis measurements (when applicable) are obtained utilizing NASCET criteria, using the distal internal carotid diameter as the denominator. CONTRAST:  50 mL ISOVUE-370 IOPAMIDOL (ISOVUE-370) INJECTION 76% COMPARISON:  CT head 08/14/2017 FINDINGS: CTA NECK FINDINGS Aortic arch: Normal aortic arch and proximal great vessels. Right carotid system: Right carotid system widely patent. Bifurcation widely patent. Left carotid system: Left carotid system widely patent. Left carotid bifurcation patent. Vertebral arteries: Both vertebral arteries widely patent without stenosis or irregularity. Skeleton: Degenerative changes in the cervical spine with spondylosis and anterolisthesis C3-4. No acute skeletal abnormality. Dental caries. Other neck: Negative for mass or adenopathy in the neck. Upper chest: Visualized lung apices clear. Review of the MIP images confirms the above findings CTA HEAD FINDINGS Anterior circulation: Right cavernous carotid widely patent. Right anterior and middle cerebral arteries widely patent Left cavernous carotid widely patent. Left  posterior communicating artery aneurysm measures approximately 4 x 6.5 mm and contains an irregular tit at the apex. This apex extends into the left temporal hematoma. Hematoma felt to be due to rupture of this aneurysm. Left anterior and middle cerebral arteries widely patent without stenosis or vasospasm. Posterior circulation: Both  vertebral arteries patent to the basilar. Basilar widely patent. Right PICA patent. Left PICA not visualized. Dominant left AICA. Superior cerebellar and posterior cerebral arteries patent bilaterally without stenosis. No aneurysm in the posterior circulation. Venous sinuses: Normal venous enhancement Anatomic variants: None Delayed phase: Left temporal hematoma unchanged. Subdural hematoma left frontal lobe and along the tentorium on the left also unchanged. Intraventricular hemorrhage unchanged. Mild hydrocephalus. 11 mm midline shift to the right unchanged. No new hemorrhage since the CT earlier today. 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 Extensive intracranial hemorrhage is stable from earlier this morning. No other aneurysm. No significant atherosclerotic disease. These results were called by telephone at the time of interpretation on 08/14/2017 at 10:37 am to Dr. Conchita Paris , who verbally acknowledged these results. Electronically Signed   By: Marlan Palau M.D.   On: 08/14/2017 10:37   Dg Chest Port 1 View  Result Date: 08/17/2017 CLINICAL DATA:  Acute respiratory failure with hypoxia EXAM: PORTABLE CHEST 1 VIEW COMPARISON:  None. FINDINGS: Nasoenteric feeding tube at least reaches the stomach. Extensive artifact from EKG leads. Linear opacity at the right base favoring atelectasis. There is low volumes with interstitial crowding. No Kerley lines, effusion, or pneumothorax. Borderline cardiomegaly, accentuated by technique. IMPRESSION: Low volumes with mild atelectasis on the right. Electronically Signed   By: Marnee Spring M.D.   On: 08/17/2017 08:18   TTE  - Left ventricle: The cavity size was normal. Systolic function was   vigorous. The estimated ejection fraction was in the range of 65%   to 70%. Wall motion was  normal; there were no regional wall   motion abnormalities. Doppler parameters are consistent with   abnormal left ventricular relaxation (grade 1 diastolic   dysfunction). There was no evidence of elevated ventricular   filling pressure by Doppler parameters. - Aortic valve: Trileaflet; normal thickness leaflets. Valve area   (VTI): 2.01 cm^2. Valve area (Vmax): 2.18 cm^2. Valve area   (Vmean): 2.06 cm^2. - Aortic root: The aortic root was normal in size. - Mitral valve: There was mild regurgitation. - Right ventricle: Systolic function was normal. - Right atrium: The atrium was normal in size. - Tricuspid valve: There was moderate regurgitation. - Pulmonic valve: There was no regurgitation. - Pulmonary arteries: Systolic pressure was moderately increased.   PA peak pressure: 48 mm Hg (S). - Inferior vena cava: The vessel was normal in size. - Pericardium, extracardiac: There was no pericardial effusion.  Ct Head Wo Contrast  Result Date: 08/19/2017 CLINICAL DATA:  Follow-up examination for intracranial hemorrhage. EXAM: CT HEAD WITHOUT CONTRAST TECHNIQUE: Contiguous axial images were obtained from the base of the skull through the vertex without intravenous contrast. COMPARISON:  Prior CT from 08/18/2017 FINDINGS: Brain: Postoperative changes from prior left frontotemporal craniotomy for surgical clipping of left P com aneurysm again seen. Continued interval decrease in blood products at the anterior left temporal lobe, now only faintly visible. Extra-axial collection overlying the left frontotemporal convexity is unchanged without significant mass effect. Small volume subdural hemorrhage along the left tentorium  relatively unchanged as well. 5 mm of left-to-right midline shift is stable. Right frontal approach ventriculostomy catheter in place with tip in the right lateral ventricle. Intraventricular hemorrhage is slightly decreased from previous. Stable ventricular size. No hydrocephalus or  ventricular trapping. Evolving subacute infarct at the lateral left thalamus/posterior limb of the left internal capsule with inferior extension again noted, stable. No new intracranial hemorrhage or large vessel territory infarct. No other new acute intracranial abnormality. Atrophy with chronic small vessel ischemic changes again noted. Vascular: Aneurysm clip in the region of the left posterior communicating artery. No hyperdense vessel. Skull: Postoperative changes from left frontotemporal craniotomy. Skin staples remain in place. Overlying scalp edema and swelling is increased from previous. Sinuses/Orbits: No acute finding about the globes and orbital soft tissues. Paranasal sinuses remain clear. Nasogastric tube partially visualized. No significant mastoid effusion. Other: None. IMPRESSION: 1. Postoperative changes from prior left frontotemporal craniotomy for surgical clipping of left P com aneurysm and left temporal hematoma evacuation. Hemorrhage at the anterior left temporal lobe continues to decrease. 5 mm of left-to-right shift stable from previous. 2. Right frontal approach ventriculostomy in place with continued interval decrease in intraventricular hemorrhage. No hydrocephalus or ventricular trapping. 3. Unchanged small volume subdural hemorrhage along the left tentorium. 4. Continued interval evolution of subacute ischemic infarct at the lateral left thalamus/posterior limb of the left internal capsule. 5. No other new acute intracranial abnormality. Electronically Signed   By: Rise Mu M.D.   On: 08/19/2017 06:22   Dg Chest Port 1 View  Result Date: 08/19/2017 CLINICAL DATA:  Fever EXAM: PORTABLE CHEST 1 VIEW COMPARISON:  Portable exam 1208 hours compared to 08/18/2017 FINDINGS: Feeding tube traverses esophagus into stomach. Normal heart size, mediastinal contours, and pulmonary vascularity. Mild bibasilar atelectasis. Lungs otherwise clear. No infiltrate, pleural effusion or  pneumothorax. Bones unremarkable. IMPRESSION: Mild bibasilar atelectasis. Electronically Signed   By: Ulyses Southward M.D.   On: 08/19/2017 12:32   TCD 08/16/17 - no vasospasm  TCD 08/19/17 - no vasospasm  TCD pending   PHYSICAL EXAM Temp:  [98.3 F (36.8 C)-102.5 F (39.2 C)] 100 F (37.8 C) (05/21 0800) Pulse Rate:  [88-119] 119 (05/21 0800) Resp:  [16-28] 23 (05/21 0800) BP: (113-174)/(64-101) 150/84 (05/21 0800) SpO2:  [97 %-100 %] 100 % (05/21 0800) Weight:  [142 lb 3.2 oz (64.5 kg)] 142 lb 3.2 oz (64.5 kg) (05/21 0500)  General - Well nourished, well developed, lethargic  Ophthalmologic - Fundi not visualized due to noncooperation.  Cardiovascular - Regular rhythm, but tachycardia.  Neuro - lethargic, awake not as alert as yesterday, eyes open on the left and with ptosis on the right. Left pupil 3.22mm and right pupil 3mm, reactive to light bilaterally, left gaze preference and not able to cross midline. Blinking to visual threat to the left but not to the right. Right facial droop and tongue midline. Pt was not able to follow simple command today and not able to repeat simple sentences. LUE 4/5, LLE 2/5 on pain, RUE and RLE 0/5 on pain stimulation. DTR 1+ and no babinski. Sensation, coordination not cooperative and gait not tested.   ASSESSMENT/PLAN Ms. ROSY ESTABROOK is a 62 y.o. female with no significant history found having fallen out of the bed with R arm weakness and altered mental status. 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  Left temporal lobe ICH w IVH/SAH/SDH, d/t L PCOM aneurysm rupture s/p clipping, EVD  and hematoma evacuation  CT head L mesial temporal hmg 12 cc w/ IVH. Surrounding edema with L to R shift 11mm. 6mm SDH along L convexity. ASPECTS 10.   CTA head & neck LPCom aneurysm c/w rupture into L temporal lobe. Extensive hmg stable from earlier.  S/p crani w/ clipping of L PCom  aneurysm, hematoma evacutaion and EVD placement 5/15 (Nundkumar)  CT head repeat interval L frontal and temp crani, L ant temporal lobe hematoma evac w/ R fronal appoach, clip L PCom. Post OR changes w/ air and edema. Stable IVH and SDH. No new abn. Decreased mass effect to 9mm L to R  CTA repeat showed left PCOM s/p clip and left temporal ICH evacuation, however, new left thalamus and left optic radiation infarcts  CT repeat x 2 stable hematoma and no hydro  TCD MWF - no vasospasm x 2. Pending tomorrow.   On nimonidpine  On TF /hr  subq heparin for VTE prophylaxis  No antithrombotic prior to admission (Goody Powders prn).   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/PCA/AchA manipulation with PCOM aneurysm clipping  Resultant - right hemiplegia  CTA repeat - patent left PCA and MCA  CT repeat x 2 - stable IVH and no hydro  Consider MRI once off EVD drain and able to go to MRI  On ASA  for stroke prevention  PT/OT - CIR  LDL 52   A1C 6.0  Left ptosis   Left ptosis with mildly larger left pupil  Likely mild injury of left CNIII with left PCOM aneurysm clipping  Respiratory distress, improved  Intubated for crani  extubate 08/16/17   Still tachypnea  CXR mild bibasilar atelectasis  CXR pending today  On mucomyst nebs  PCCM following  Hydrocephalus  S/p EVD  Not tolerating EVD clamping  EVD drain 14cc/h  CT stable  Per NSG, will again clamp EVD later this week  Fever  WBC 12.5->19.9->19.3->16.8->15.5->15.4  Tmax 100.5->101.9->102.5->100.0  UA WBC 6-10  Urine culture G- rods -> on rocephin 08/19/17  Blood culture NGTD  Consider CSF sampling if needed  CXR pending  Management per CCM  Tachycardia - on metoprolol  bid  Blood pressure  controlled  SBP goal < 160  off cardene drip  On metoprolol  bid->50mg   bid  Labetalol PRN  Other Stroke Risk Factors  No family hx aneurysms  Other Active Problems  Hypokalemia 3.3 -> 3.0->2.8->3.5->3.4->3.3, supplement   Hospital day # 6  This patient is critically ill due to ICH, SAH, IVH, PCOM aneurysm, s/p hematoma evacuation, EVD, fever, leukocytosis and aneurysm clipping and at significant risk of neurological worsening, death form re-bleed, hydrocephalus, seizure, cerebral edema and brain herniation. This patient's care requires constant monitoring of vital signs, hemodynamics, respiratory and cardiac monitoring, review of multiple databases, neurological assessment, discussion with family, other specialists and medical decision making of high complexity. I spent 40 minutes of neurocritical care time in the care of this patient.  Marvel Plan, MD PhD Stroke Neurology 08/20/2017 10:59 AM    To contact Stroke Continuity provider, please refer to [email or web address]. After hours, contact General Neurology

## 2017-08-20 NOTE — Consult Note (Signed)
Physical Medicine and Rehabilitation Consult Reason for Consult: Right side weakness Referring Physician: Dr. Conchita Paris   HPI: Carla Bauer is a 63 y.o. right-handed female with history of headaches.  Per chart review, patient lives with her daughter who works 7-3.  Family arranging care as needed on discharge.  One level home with level entry.  Patient independent prior to admission.  Presented 08/14/2017 after being found down by her family with right side weakness.  Cranial CT scan reviewed, showing left temporal hemorrhage.  Per report, a 4 x 2 x 3 0.4 x 2.1 cm intraparenchymal hematoma in the medial left temporal lobe with intraventricular penetration in filling of the left lateral ventricle with blood.  Mass-effect with right to left shift of 11 mm.  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  Underwent left craniotomy for clipping of aneurysm, evacuation of left temporal hematoma placement of right frontal external ventricular drain 08/14/2017 per Dr. Conchita Paris.  Hospital course presently on intravenous Rocephin for suspected UTI.  Patient is NPO with nasogastric tube feeds for nutritional support.  Subcutaneous heparin was initiated 08/19/2017 after latest CT of the head showing to be stable.  Physical and occupational therapy evaluation completed 08/19/2017 with recommendations of physical medicine rehab consult.  Review of Systems  Unable to perform ROS: Acuity of condition   History reviewed. No pertinent past medical history., unable to obtain from patient.  Past Surgical History:  Procedure Laterality Date  . CESAREAN SECTION    . CRANIOTOMY Left 08/14/2017   Procedure: 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;  Surgeon: Lisbeth Renshaw, MD;  Location: [facility] OR;  Service: Neurosurgery;  Laterality: Left;    History reviewed. No pertinent family history., unable to obtain from patient Social History:  reports that she has never smoked. She does not have any smokeless tobacco history on file. She reports that she does not drink alcohol or use drugs. Allergies: No Known Allergies Medications Prior to Admission  Medication Sig Dispense Refill  . acetaminophen (TYLENOL) 500 MG tablet Take 1 tablet (500 mg total) by mouth every 6 (six) hours as needed. (Patient taking differently: Take 500 mg by mouth every 6 (six) hours as needed for mild pain. ) 30 tablet 0  . Aspirin-Salicylamide-Caffeine (BC HEADACHE POWDER PO) Take 1 packet by mouth daily as needed (headache).      Home: Home Living Family/patient expects to be discharged to:: Private residence Living Arrangements: Children Available Help at Discharge: Family, Available 24 hours/day Type of Home: House Home Access: Level entry Home Layout: One level Additional Comments: Pt lives with daughter who works 7-3, and her children who are young.  daughter reports that pt will have family or friends assist her at discharge while she is at work, but isn't able to state exactly who that is.  The other daughter became tearful and stated "I need to have a say in what happens... she's my mother, and I am the oldest"  Functional History: Prior Function Level of Independence: Independent Functional Status:  Mobility: Bed Mobility Overal bed mobility: Needs Assistance Bed Mobility: Rolling, Sidelying to Sit Rolling: Max assist Sidelying to sit: Max assist, +2 for physical assistance(to max assist) General bed mobility comments: cued to push up with L UE and pt did assist with additional truncal assist given Transfers Overall transfer level: Needs assistance Equipment used:  2 person hand held assist Transfers: Sit to/from Stand, Scientist, clinical (histocompatibility and immunogenetics) Transfers Sit to Stand: Max assist, +2 physical assistance Squat pivot transfers: Max assist, +2 physical  assistance General transfer comment: cues for direction.  Assist of 2 to help pt forward and upright.  Significant truncal and R LE assist throughout squat transfer. Ambulation/Gait General Gait Details: not able today    ADL: ADL Overall ADL's : Needs assistance/impaired Eating/Feeding: NPO Grooming: Wash/dry hands, Wash/dry face, Maximal assistance, Sitting Upper Body Bathing: Total assistance, Sitting Lower Body Bathing: Total assistance, Sit to/from stand Upper Body Dressing : Total assistance, Sitting Lower Body Dressing: Total assistance, Sit to/from stand Toilet Transfer: Maximal assistance, +2 for physical assistance, BSC Toileting- Clothing Manipulation and Hygiene: Total assistance, Sit to/from stand Functional mobility during ADLs: Maximal assistance, +2 for physical assistance  Cognition: Cognition Overall Cognitive Status: Impaired/Different from baseline Orientation Level: Oriented to person, Disoriented to place, Disoriented to time, Disoriented to situation Cognition Arousal/Alertness: Lethargic, Awake/alert Behavior During Therapy: Flat affect, WFL for tasks assessed/performed Overall Cognitive Status: Impaired/Different from baseline Area of Impairment: Orientation, Attention, Memory, Following commands, Awareness, Safety/judgement, Problem solving Current Attention Level: Focused Following Commands: Follows one step commands inconsistently Safety/Judgement: Decreased awareness of safety, Decreased awareness of deficits Awareness: Intellectual Problem Solving: Slow processing, Requires verbal cues, Requires tactile cues, Difficulty sequencing, Decreased initiation General Comments: Pt with signficant communication deficits.  She will follow some one step motor commands with multi modal cues.  She will give one word responses occasionally.    Blood pressure (!) 165/101, pulse (!) 109, temperature 98.3 F (36.8 C), temperature source Axillary, resp. rate (!) 24,  height  (1.549 m), weight 64.8 kg (142 lb 13.7 oz), SpO2 99 %. Physical Exam  Vitals reviewed. Constitutional: She appears well-developed and well-nourished.  HENT:  Craniotomy site clean and dry.   Nasogastric tube in place EVD in place  Eyes: Right eye exhibits no discharge. Left eye exhibits no discharge.  Pupils sluggish but reactive to light Left ptosis  Neck: Neck supple. No thyromegaly present.  Cardiovascular: Regular rhythm.  +Tachycardia  Respiratory: Effort normal and breath sounds normal.  Patient with limited inspiratory effort  GI: Soft. Bowel sounds are normal. She exhibits no distension.  Musculoskeletal:  No edema or tenderness in extremities  Neurological:  Lethargic  She remained nonverbal throughout exam  Exam limited by participation, but moving LUE freely.  No other movement noted Down going babinski on left, no response on right   Skin:  See above  Psychiatric:  Unable to assess due to mentation    Results for orders placed or performed during the hospital encounter of 08/14/17 (from the past 24 hour(s))  Glucose, capillary     Status: Abnormal   Collection Time: 08/19/17  8:26 AM  Result Value Ref Range   Glucose-Capillary 176 (H) 65 - 99 mg/dL   Comment 1 Notify RN    Comment 2 Document in Chart   Basic metabolic panel     Status: Abnormal   Collection Time: 08/19/17  9:30 AM  Result Value Ref Range   Sodium 148 (H) 135 - 145 mmol/L   Potassium 3.4 (L) 3.5 - 5.1 mmol/L   Chloride 114 (H) 101 - 111 mmol/L   CO2 22 22 - 32 mmol/L   Glucose, Bld 190 (H) 65 - 99 mg/dL   BUN 10 6 - 20 mg/dL   Creatinine, Ser 9.14 0.44 - 1.00 mg/dL   Calcium 9.5 8.9 - 78.2 mg/dL  GFR calc non Af Amer >60 >60 mL/min   GFR calc Af Amer >60 >60 mL/min   Anion gap 12 5 - 15  Procalcitonin - Baseline     Status: None   Collection Time: 08/19/17  9:30 AM  Result Value Ref Range   Procalcitonin 0.23 ng/mL  CBC     Status: Abnormal   Collection Time: 08/19/17  10:48 AM  Result Value Ref Range   WBC 15.5 (H) 4.0 - 10.5 K/uL   RBC 3.52 (L) 3.87 - 5.11 MIL/uL   Hemoglobin 10.3 (L) 12.0 - 15.0 g/dL   HCT 16.1 (L) 09.6 - 04.5 %   MCV 91.2 78.0 - 100.0 fL   MCH 29.3 26.0 - 34.0 pg   MCHC 32.1 30.0 - 36.0 g/dL   RDW 40.9 81.1 - 91.4 %   Platelets 289 150 - 400 K/uL  Glucose, capillary     Status: Abnormal   Collection Time: 08/19/17 11:53 AM  Result Value Ref Range   Glucose-Capillary 173 (H) 65 - 99 mg/dL  Glucose, capillary     Status: Abnormal   Collection Time: 08/19/17  4:03 PM  Result Value Ref Range   Glucose-Capillary 152 (H) 65 - 99 mg/dL  Glucose, capillary     Status: Abnormal   Collection Time: 08/19/17  7:48 PM  Result Value Ref Range   Glucose-Capillary 154 (H) 65 - 99 mg/dL  Glucose, capillary     Status: Abnormal   Collection Time: 08/19/17 11:34 PM  Result Value Ref Range   Glucose-Capillary 210 (H) 65 - 99 mg/dL  Glucose, capillary     Status: Abnormal   Collection Time: 08/20/17  3:26 AM  Result Value Ref Range   Glucose-Capillary 179 (H) 65 - 99 mg/dL  CBC     Status: Abnormal   Collection Time: 08/20/17  3:52 AM  Result Value Ref Range   WBC 15.4 (H) 4.0 - 10.5 K/uL   RBC 3.62 (L) 3.87 - 5.11 MIL/uL   Hemoglobin 10.6 (L) 12.0 - 15.0 g/dL   HCT 78.2 (L) 95.6 - 21.3 %   MCV 91.4 78.0 - 100.0 fL   MCH 29.3 26.0 - 34.0 pg   MCHC 32.0 30.0 - 36.0 g/dL   RDW 08.6 57.8 - 46.9 %   Platelets 300 150 - 400 K/uL  Basic metabolic panel     Status: Abnormal   Collection Time: 08/20/17  3:52 AM  Result Value Ref Range   Sodium 147 (H) 135 - 145 mmol/L   Potassium 3.3 (L) 3.5 - 5.1 mmol/L   Chloride 112 (H) 101 - 111 mmol/L   CO2 24 22 - 32 mmol/L   Glucose, Bld 189 (H) 65 - 99 mg/dL   BUN 13 6 - 20 mg/dL   Creatinine, Ser 6.29 0.44 - 1.00 mg/dL   Calcium 9.7 8.9 - 52.8 mg/dL   GFR calc non Af Amer >60 >60 mL/min   GFR calc Af Amer >60 >60 mL/min   Anion gap 11 5 - 15   Ct Head Wo Contrast  Result Date:  08/19/2017 CLINICAL DATA:  Follow-up examination for intracranial hemorrhage. EXAM: CT HEAD WITHOUT CONTRAST TECHNIQUE: Contiguous axial images were obtained from the base of the skull through the vertex without intravenous contrast. COMPARISON:  Prior CT from 08/18/2017 FINDINGS: Brain: Postoperative changes from prior left frontotemporal craniotomy for surgical clipping of left P com aneurysm again seen. Continued interval decrease in blood products at the anterior left temporal lobe, now  only faintly visible. Extra-axial collection overlying the left frontotemporal convexity is unchanged without significant mass effect. Small volume subdural hemorrhage along the left tentorium relatively unchanged as well. 5 mm of left-to-right midline shift is stable. Right frontal approach ventriculostomy catheter in place with tip in the right lateral ventricle. Intraventricular hemorrhage is slightly decreased from previous. Stable ventricular size. No hydrocephalus or ventricular trapping. Evolving subacute infarct at the lateral left thalamus/posterior limb of the left internal capsule with inferior extension again noted, stable. No new intracranial hemorrhage or large vessel territory infarct. No other new acute intracranial abnormality. Atrophy with chronic small vessel ischemic changes again noted. Vascular: Aneurysm clip in the region of the left posterior communicating artery. No hyperdense vessel. Skull: Postoperative changes from left frontotemporal craniotomy. Skin staples remain in place. Overlying scalp edema and swelling is increased from previous. Sinuses/Orbits: No acute finding about the globes and orbital soft tissues. Paranasal sinuses remain clear. Nasogastric tube partially visualized. No significant mastoid effusion. Other: None. IMPRESSION: 1. Postoperative changes from prior left frontotemporal craniotomy for surgical clipping of left P com aneurysm and left temporal hematoma evacuation. Hemorrhage at  the anterior left temporal lobe continues to decrease. 5 mm of left-to-right shift stable from previous. 2. Right frontal approach ventriculostomy in place with continued interval decrease in intraventricular hemorrhage. No hydrocephalus or ventricular trapping. 3. Unchanged small volume subdural hemorrhage along the left tentorium. 4. Continued interval evolution of subacute ischemic infarct at the lateral left thalamus/posterior limb of the left internal capsule. 5. No other new acute intracranial abnormality. Electronically Signed   By: Rise Mu M.D.   On: 08/19/2017 06:22   Dg Chest Port 1 View  Result Date: 08/19/2017 CLINICAL DATA:  Fever EXAM: PORTABLE CHEST 1 VIEW COMPARISON:  Portable exam 1208 hours compared to 08/18/2017 FINDINGS: Feeding tube traverses esophagus into stomach. Normal heart size, mediastinal contours, and pulmonary vascularity. Mild bibasilar atelectasis. Lungs otherwise clear. No infiltrate, pleural effusion or pneumothorax. Bones unremarkable. IMPRESSION: Mild bibasilar atelectasis. Electronically Signed   By: Ulyses Southward M.D.   On: 08/19/2017 12:32   Dg Chest Port 1 View  Result Date: 08/18/2017 CLINICAL DATA:  Patient with fever. EXAM: PORTABLE CHEST 1 VIEW COMPARISON:  Chest radiograph 08/17/2017 FINDINGS: Enteric tube courses inferior to the diaphragm. Stable cardiac and mediastinal contours. No consolidative pulmonary opacities. No pleural effusion or pneumothorax. IMPRESSION: No acute cardiopulmonary process. Electronically Signed   By: Annia Belt M.D.   On: 08/18/2017 13:39    Assessment/Plan: Diagnosis: intraparenchymal hematoma in the medial left temporal lobe  Labs and images independently reviewed.  Records reviewed and summated above. Stroke: Continue secondary stroke prophylaxis and Risk Factor Modification listed below:   Blood Pressure Management:  Continue current medication with prn's with permisive HTN per primary team Prediabetes  management:   Right sided hemiparesis: fit for orthosis to prevent contractures (resting hand splint for day, wrist cock up splint at night, PRAFO, etc) Motor recovery: Fluoxetine  1. Does the need for close, 24 hr/day medical supervision in concert with the patient's rehab needs make it unreasonable for this patient to be served in a less intensive setting? Yes  2. Co-Morbidities requiring supervision/potential complications: UTI (cont abx), headaches (ensure pain does not limit therapies), tachypnea (monitor RR and O2 Sats with increased physical exertion), Tachycardia (monitor in accordance with pain and increasing activity), HTN (monitor and provide prns in accordance with increased physical exertion and pain), prediabetes (Monitor in accordance with exercise and adjust meds as necessary), leukocytosis (  cont to monitor for signs and symptoms of infection, further workup if indicated), ABLA (transfuse if necessary to ensure appropriate perfusion for increased activity tolerance), hypokalemia (continue to monitor and replete as necessary) 3. Due to bladder management, bowel management, safety, skin/wound care, disease management, medication administration, pain management and patient education, does the patient require 24 hr/day rehab nursing? Yes 4. Does the patient require coordinated care of a physician, rehab nurse, PT (1-2 hrs/day, 5 days/week), OT (1-2 hrs/day, 5 days/week) and SLP (1-2 hrs/day, 5 days/week) to address physical and functional deficits in the context of the above medical diagnosis(es)? Yes Addressing deficits in the following areas: balance, endurance, locomotion, strength, transferring, bowel/bladder control, bathing, dressing, feeding, grooming, toileting, cognition, speech, language, swallowing and psychosocial support 5. Can the patient actively participate in an intensive therapy program of at least 3 hrs of therapy per day at least 5 days per week? Yes 6. The potential for  patient to make measurable gains while on inpatient rehab is excellent 7. Anticipated functional outcomes upon discharge from inpatient rehab are min assist  with PT, min assist with OT, min assist with SLP. 8. Estimated rehab length of stay to reach the above functional goals is: 22-27 days. 9. Anticipated D/C setting: Home 10. Anticipated post D/C treatments: HH therapy and Home excercise program 11. Overall Rehab/Functional Prognosis: good  RECOMMENDATIONS: This patient's condition is appropriate for continued rehabilitative care in the following setting: CIR when medically stable Patient has agreed to participate in recommended program. Potentially Note that insurance prior authorization may be required for reimbursement for recommended care.  Comment: Rehab Admissions Coordinator to follow up.   I have personally performed a face to face diagnostic evaluation, including, but not limited to relevant history and physical exam findings, of this patient and developed relevant assessment and plan.  Additionally, I have reviewed and concur with the physician assistant's documentation above.   Maryla Morrow, MD, ABPMR Mcarthur Rossetti Angiulli, PA-C 08/20/2017

## 2017-08-20 NOTE — Progress Notes (Signed)
Inpatient Diabetes Program Recommendations  AACE/ADA: New Consensus Statement on Inpatient Glycemic Control (2019)  Target Ranges:  Prepandial:   less than 140 mg/dL      Peak postprandial:   less than 180 mg/dL (1-2 hours)      Critically ill patients:  140 - 180 mg/dL   Results for Carla Bauer, Carla Bauer (MRN [id number]) as of 08/20/2017 11:49  Ref. Range 08/19/2017 08:26 08/19/2017 11:53 08/19/2017 16:03 08/19/2017 19:48 08/19/2017 23:34 08/20/2017 03:26 08/20/2017 07:31 08/20/2017 11:20  Glucose-Capillary Latest Ref Range: 65 - 99 mg/dL 086 (H) 578 (H) 469 (H) 154 (H) 210 (H) 179 (H) 201 (H) 233 (H)   Review of Glycemic Control  Current orders for Inpatient glycemic control: None  Inpatient Diabetes Program Recommendations:   Insulin-Correction: Patient does not have any history of DM and glucose has been elevated.  Please consider ordering Novolog 0-9 units Q4H.  Thanks, Orlando Penner, RN, MSN, CDE Diabetes Coordinator Inpatient Diabetes Program [phone] (Team Pager from 8am to 5pm)

## 2017-08-20 NOTE — Progress Notes (Signed)
PULMONARY / CRITICAL CARE MEDICINE   Name: Carla Bauer MRN: [id number] DOB: 1956/02/11    ADMISSION DATE:  08/14/2017 CONSULTATION DATE:  08/14/2017  REFERRING MD:  Patric Dykes - CNSA.   CHIEF COMPLAINT:  Aneurysmal subarachnoid hemorrhage.   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  Remained on vent post op.  SUBJECTIVE:   This is day 7 S/P SAH. EVD remains open at +10.She is moving the LUE spontaneously for me but is not at all interactive.    VITAL SIGNS: BP (!) 168/92   Pulse (!) 106   Temp 98.3 F (36.8 C) (Axillary)   Resp 19   Ht  (1.549 m)   Wt 142 lb 3.2 oz (64.5 kg)   SpO2 100%   BMI 26.87 kg/m   INTAKE / OUTPUT: I/O last 3 completed shifts: In: 3187.5 [I.V.:737.5; NG/GT:2350; IV Piggyback:100] Out: 2625 [Urine:2325; Drains:125; Stool:175]  PHYSICAL EXAMINATION: General: Moving left upper extremity spontaneously but not at all interactive for me.  Extubated and in no distress.    HEENT: Craniotomy site is well opposed and dry without drainage.  No difficulty in maintaining airway. Neuro: No response to voice for me.  Left ptosis.  Pupils equal.  Moves left upper extremity spontaneously.  Does not withdraw the right from pain. CV: S1 and S2 are regular without murmur rub or gallop  PULM: Respirations are unlabored, there is symmetric air movement, no wheezes, no rales.    GI: The abdomen is obese and soft without any organomegaly masses tenderness guarding or rebound   Extremities: warm/dry, no sig edema  Skin: no rashes or lesions   LABS:  BMET Recent Labs  Lab 08/18/17 0241 08/19/17 0930 08/20/17 0352  NA 149* 148* 147*  K 3.5 3.4* 3.3*  CL 115* 114* 112*  CO2 BUN CREATININE 0.65 0.57 0.70  GLUCOSE 189* 190* 189*    Electrolytes Recent Labs  Lab 08/16/17 0316  08/18/17 0241  08/19/17 0930 08/20/17 0352  CALCIUM 8.6*   < > 9.7 9.5 9.7  MG 2.0  --  1.9  --   --    < > = values in this interval not displayed.    CBC Recent Labs  Lab 08/18/17 0241 08/19/17 1048 08/20/17 0352  WBC 16.8* 15.5* 15.4*  HGB 10.2* 10.3* 10.6*  HCT 30.6* 32.1* 33.1*  PLT 269 289 300    Coag's Recent Labs  Lab 08/14/17 0648  APTT 25  INR 1.01    Sepsis Markers Recent Labs  Lab 08/19/17 0930 08/20/17 0352  PROCALCITON 0.23 0.21    ABG No results for input(s): PHART, PCO2ART, PO2ART in the last 168 hours.  Liver Enzymes Recent Labs  Lab 08/14/17 0648  AST 22  ALT 16  ALKPHOS 66  BILITOT 0.6  ALBUMIN 3.9    Cardiac Enzymes No results for input(s): TROPONINI, PROBNP in the last 168 hours.  Glucose Recent Labs  Lab 08/19/17 0826 08/19/17 1153 08/19/17 1603 08/19/17 1948 08/19/17 2334 08/20/17 0326  GLUCAP 176* 173* 152* 154* 210* 179*    Imaging Dg Chest Port 1 View  Result Date: 08/19/2017 CLINICAL DATA:  Fever EXAM: PORTABLE CHEST 1 VIEW COMPARISON:  Portable exam 1208 hours compared to 08/18/2017 FINDINGS: Feeding tube traverses esophagus into stomach. Normal heart size, mediastinal contours, and pulmonary  vascularity. Mild bibasilar atelectasis. Lungs otherwise clear. No infiltrate, pleural effusion or pneumothorax. Bones unremarkable. IMPRESSION: Mild bibasilar atelectasis. Electronically Signed   By: Ulyses Southward M.D.   On: 08/19/2017 12:32     STUDIES:  CT Head 5/17 >> increasing hypoattenuation within the left medial temporal lobe extending to left posterior lentiform nucleus & posterior limb of internal capsule consistent with developing early subacute infarction.  Mild decrease in intraventricular hemorrhage, decreased mass effect at 7mm, decreased air CTA Head 5/17 >> no findings of vasospasm, no new large vessel occlusion, left posterior communicating artery aneurysm clip Echo 5/17 >> EF 65 to 70%, grade 1 DD, mild MR, mod TR, PAS 48  mmHg  CULTURES:  ANTIBIOTICS:  SIGNIFICANT EVENTS: 5/15 Clipping PCOM aneurysm and clot evacuation.  2 units PRBC's intra-op  LINES/TUBES: EVD 5/15 >>  A-Line 5/15 >> out ETT 5/15 >> 5/17  DISCUSSION: No issues after extubation 5/17.  Neurosurgery planning for f/u CT head 5/19.  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  Mental status remains tenuous but currently protecting airway.  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 PLAN -  Off cardene  goal SBP < 160 PRN labetalol  Continue per tube metoprolol   Hypokalemia. Hypernatremia  PLAN -  KVO IVF  F/u chem in am  Replete K PRN   Hyperglycemia. PLAN -  SSI   Anemia of critical illness. - stable.  No s/s bleeding  PLAN -  F/u CBC  SCD's    DVT prophylaxis - SCD Nutrition - tube feeds Goals of care - full code     Penny Pia, MD 08/20/2017  7:01 AM Pager: [phone] or [phone]

## 2017-08-20 NOTE — Progress Notes (Signed)
  Speech Language Pathology Treatment: Dysphagia  Patient Details Name: RIYAN GAVINA MRN: [id number] DOB: 11-05-1955 Today's Date: 08/20/2017 Time: 4098-1191 SLP Time Calculation (min) (ACUTE ONLY): 21 min  Assessment / Plan / Recommendation Clinical Impression  Though pt awake, poor focused attention and response impedes ability to consume PO. Pt did respond to tactile cues, touch of ice to lips with acceptance and mastication of ice. No swallow response elicited. Cough response noted after ice and intermittently with secretions. Overall airway protection is tenuous. Will follow for readiness.   HPI HPI: Ms. LIANNY MOLTER is a 62 y.o. female with no significant history found having fallen out of the bed with R arm weakness and altered mental status. 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 Intubated ETT 5/15- 5/17.      SLP Plan     Patient needs continued Speech Lanaguage Pathology Services    Recommendations  Diet recommendations: NPO                Follow up Recommendations: Skilled Nursing facility SLP Visit Diagnosis: Attention and concentration deficit Attention and concentration deficit following: Cerebral infarction       GO               Harlon Ditty, MA CCC-SLP [phone]  Claudine Mouton 08/20/2017, 11:59 AM

## 2017-08-20 NOTE — Evaluation (Signed)
Speech Language Pathology Evaluation Patient Details Name: Carla Bauer MRN: [id number] DOB: 17-Dec-1955 Today's Date: 08/20/2017 Time: 8295-6213 SLP Time Calculation (min) (ACUTE ONLY): 21 min  Problem List:  Patient Active Problem List   Diagnosis Date Noted  . Acute lower UTI   . Tachypnea   . Tachycardia   . Reactive hypertension   . Prediabetes   . Acute blood loss anemia   . Hypokalemia   . Fever   . Leukocytosis   . Acute respiratory failure with hypoxia (HCC)   . 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 ICH (intracerebral hemorrhage) (HCC) 08/14/2017  . SAH (subarachnoid hemorrhage) (HCC) 08/14/2017  . 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 Hydrocephalus 08/14/2017  . Brain herniation (HCC) 08/14/2017  . Cytotoxic brain edema (HCC) 08/14/2017   Past Medical History: History reviewed. No pertinent past medical history. Past Surgical History:  Past Surgical History:  Procedure Laterality Date  . CESAREAN SECTION    . 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 DALYNN JHAVERI is a 62 y.o. female with no significant history found having fallen out of the bed with R arm weakness and altered mental status. 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 Intubated ETT 5/15- 5/17.   Assessment / Plan / Recommendation Clinical Impression  Pt demonstrates severe cognitive lingusitic impairment. Eyes are open during session with occasional signs of sustained gaze to speaker. Pt does not follow commands even with repeition and time given for delayed responses. Pt responds to tactile manipulation of her hand, will hold positions on her  hand with shaping, but does not follow commands or repeat movements with assist. There is no attempt to articulate. Pt does occasionally make a smile with her left lips, questionable if this is in response to speakers, or reflexive.  will continue efforts to facilitate meaningful communicative responses in acute setting. Recommend SNF at this time pending further pt participation.     SLP Assessment  SLP Recommendation/Assessment: Patient needs continued Speech Lanaguage Pathology Services SLP Visit Diagnosis: Attention and concentration deficit Attention and concentration deficit following: Cerebral infarction    Follow Up Recommendations  Skilled Nursing facility    Frequency and Duration min 2x/week  2 weeks      SLP Evaluation Cognition  Overall Cognitive Status: Impaired/Different from baseline Arousal/Alertness: Awake/alert Orientation Level: Oriented to person;Disoriented to place;Disoriented to time;Disoriented to situation Attention: Focused Focused Attention: Impaired Focused Attention Impairment: Verbal basic;Functional basic       Comprehension  Auditory Comprehension Overall Auditory Comprehension: Impaired Yes/No Questions: Impaired Basic Biographical Questions: 0-25% accurate Commands: Impaired One Step Basic Commands: 0-24% accurate    Expression Verbal Expression Overall Verbal Expression: Impaired Initiation: Impaired   Oral / Motor  Oral Motor/Sensory Function Overall Oral Motor/Sensory Function: Moderate impairment Facial ROM: Reduced right;Suspected CN VII (facial) dysfunction Facial Symmetry: Abnormal symmetry right;Suspected CN VII (facial) dysfunction Facial Strength: Reduced right;Suspected CN VII (facial) dysfunction Lingual ROM: (not observed)   GO                    Henry Utsey, Riley Nearing 08/20/2017, 11:57 AM

## 2017-08-20 NOTE — Progress Notes (Signed)
eLink Physician-Brief Progress Note Patient Name: Carla Bauer DOB: 1955-04-18 MRN: [id number]   Date of Service  08/20/2017  HPI/Events of Note  Urine culture is positive for E. Coli. Patient is currently on Rocephin.   eICU Interventions  Will ask pharmacy to review the appropriateness of Rocephin given [facility] antibiogram and dosing.      Intervention Category Major Interventions: Infection - evaluation and management  Vrinda Heckstall Eugene 08/20/2017, 7:27 PM

## 2017-08-21 ENCOUNTER — Inpatient Hospital Stay (HOSPITAL_COMMUNITY): Payer: Medicaid Other

## 2017-08-21 DIAGNOSIS — I340 Nonrheumatic mitral (valve) insufficiency: Secondary | ICD-10-CM

## 2017-08-21 LAB — GLUCOSE, CAPILLARY
GLUCOSE-CAPILLARY: 193 mg/dL — AB (ref 65–99)
Glucose-Capillary: 173 mg/dL — ABNORMAL HIGH (ref 65–99)
Glucose-Capillary: 184 mg/dL — ABNORMAL HIGH (ref 65–99)
Glucose-Capillary: 195 mg/dL — ABNORMAL HIGH (ref 65–99)
Glucose-Capillary: 224 mg/dL — ABNORMAL HIGH (ref 65–99)
Glucose-Capillary: 286 mg/dL — ABNORMAL HIGH (ref 65–99)

## 2017-08-21 LAB — BASIC METABOLIC PANEL
Anion gap: 10 (ref 5–15)
BUN: 20 mg/dL (ref 6–20)
CO2: 23 mmol/L (ref 22–32)
CREATININE: 0.74 mg/dL (ref 0.44–1.00)
Calcium: 9.8 mg/dL (ref 8.9–10.3)
Chloride: 118 mmol/L — ABNORMAL HIGH (ref 101–111)
GFR calc Af Amer: >60 mL/min (ref >60)
GFR calc non Af Amer: >60 mL/min (ref >60)
Glucose, Bld: 196 mg/dL — ABNORMAL HIGH (ref 65–99)
Potassium: 3.5 mmol/L (ref 3.5–5.1)
SODIUM: 151 mmol/L — AB (ref 135–145)

## 2017-08-21 LAB — CBC WITH DIFFERENTIAL/PLATELET
Abs Immature Granulocytes: 0.1 10*3/uL (ref 0.0–0.1)
BASOS PCT: 1 %
Basophils Absolute: 0.1 10*3/uL (ref 0.0–0.1)
EOS ABS: 0 10*3/uL (ref 0.0–0.7)
Eosinophils Relative: 0 %
HCT: 32.5 % — ABNORMAL LOW (ref 36.0–46.0)
Hemoglobin: 10.3 g/dL — ABNORMAL LOW (ref 12.0–15.0)
Immature Granulocytes: 1 %
LYMPHS PCT: 21 %
Lymphs Abs: 3.2 10*3/uL (ref 0.7–4.0)
MCH: 29.4 pg (ref 26.0–34.0)
MCHC: 31.7 g/dL (ref 30.0–36.0)
MCV: 92.9 fL (ref 78.0–100.0)
MONO ABS: 1.4 10*3/uL — AB (ref 0.1–1.0)
MONOS PCT: 10 %
Neutro Abs: 10.3 10*3/uL — ABNORMAL HIGH (ref 1.7–7.7)
Neutrophils Relative %: 67 %
PLATELETS: 306 10*3/uL (ref 150–400)
RBC: 3.5 MIL/uL — ABNORMAL LOW (ref 3.87–5.11)
RDW: 14.6 % (ref 11.5–15.5)
WBC: 15.1 10*3/uL — ABNORMAL HIGH (ref 4.0–10.5)

## 2017-08-21 LAB — URINE CULTURE
Culture: >=100000 — AB
Special Requests: NORMAL

## 2017-08-21 LAB — PROCALCITONIN: PROCALCITONIN: 0.19 ng/mL

## 2017-08-21 MED ORDER — CHLORHEXIDINE GLUCONATE 0.12 % MT SOLN
OROMUCOSAL | Status: AC
  Administered 2017-08-21: 15 mL
  Filled 2017-08-21: qty 15

## 2017-08-21 MED ORDER — INSULIN ASPART 100 UNIT/ML ~~LOC~~ SOLN
0.0000 [IU] | SUBCUTANEOUS | Status: DC
  Administered 2017-08-21 (×2): 5 [IU] via SUBCUTANEOUS
  Administered 2017-08-21 (×4): 2 [IU] via SUBCUTANEOUS
  Administered 2017-08-22: 1 [IU] via SUBCUTANEOUS
  Administered 2017-08-22 (×3): 3 [IU] via SUBCUTANEOUS
  Administered 2017-08-22 – 2017-08-23 (×3): 2 [IU] via SUBCUTANEOUS
  Administered 2017-08-23: 5 [IU] via SUBCUTANEOUS
  Administered 2017-08-23 (×3): 2 [IU] via SUBCUTANEOUS
  Administered 2017-08-24 (×3): 3 [IU] via SUBCUTANEOUS
  Administered 2017-08-24: 2 [IU] via SUBCUTANEOUS
  Administered 2017-08-24: 5 [IU] via SUBCUTANEOUS
  Administered 2017-08-24 (×2): 3 [IU] via SUBCUTANEOUS
  Administered 2017-08-25 (×2): 2 [IU] via SUBCUTANEOUS
  Administered 2017-08-25 (×2): 1 [IU] via SUBCUTANEOUS
  Administered 2017-08-26 (×3): 2 [IU] via SUBCUTANEOUS
  Administered 2017-08-26 – 2017-08-27 (×4): 1 [IU] via SUBCUTANEOUS
  Administered 2017-08-27: 3 [IU] via SUBCUTANEOUS
  Administered 2017-08-28 (×2): 2 [IU] via SUBCUTANEOUS
  Administered 2017-08-28: 1 [IU] via SUBCUTANEOUS
  Administered 2017-08-28: 2 [IU] via SUBCUTANEOUS
  Administered 2017-08-28: 1 [IU] via SUBCUTANEOUS
  Administered 2017-08-28: 3 [IU] via SUBCUTANEOUS
  Administered 2017-08-28: 2 [IU] via SUBCUTANEOUS
  Administered 2017-08-29: 3 [IU] via SUBCUTANEOUS
  Administered 2017-08-30: 1 [IU] via SUBCUTANEOUS
  Administered 2017-08-30: 2 [IU] via SUBCUTANEOUS
  Administered 2017-08-30: 1 [IU] via SUBCUTANEOUS
  Administered 2017-08-30: 2 [IU] via SUBCUTANEOUS
  Administered 2017-08-31 (×3): 1 [IU] via SUBCUTANEOUS
  Administered 2017-08-31 (×2): 2 [IU] via SUBCUTANEOUS
  Administered 2017-09-01: 1 [IU] via SUBCUTANEOUS
  Administered 2017-09-01 (×3): 2 [IU] via SUBCUTANEOUS
  Administered 2017-09-01 – 2017-09-02 (×5): 1 [IU] via SUBCUTANEOUS
  Administered 2017-09-02: 2 [IU] via SUBCUTANEOUS
  Administered 2017-09-02: 5 [IU] via SUBCUTANEOUS
  Administered 2017-09-03: 2 [IU] via SUBCUTANEOUS
  Administered 2017-09-03 (×2): 1 [IU] via SUBCUTANEOUS

## 2017-08-21 NOTE — Progress Notes (Signed)
eLink Physician-Brief Progress Note Patient Name: Carla Bauer DOB: 01/16/1956 MRN: [id number]   Date of Service  08/21/2017  HPI/Events of Note  Hyperglycemia - Blood glucose = 286.  eICU Interventions  Will order: 1. Q 4 hour sensitive Novolog SSI.      Intervention Category Major Interventions: Hyperglycemia - active titration of insulin therapy  Sommer,Steven Dennard Nip 08/21/2017, 12:51 AM

## 2017-08-21 NOTE — Progress Notes (Signed)
  NEUROSURGERY PROGRESS NOTE   Pt seen and examined. No issues overnight.   EXAM: Temp:  [98.3 F (36.8 C)-101.4 F (38.6 C)] 99.9 F (37.7 C) (05/22 1221) Pulse Rate:  [80-123] 106 (05/22 1500) Resp:  [19-29] 26 (05/22 1500) BP: (100-173)/(66-99) 157/93 (05/22 1400) SpO2:  [96 %-100 %] 96 % (05/22 1500) Weight:  [64.9 kg (143 lb 1.3 oz)] 64.9 kg (143 lb 1.3 oz) (05/22 0436) Intake/Output      05/21 0701 - 05/22 0700 05/22 0701 - 05/23 0700   I.V. (mL/kg)     NG/GT 1200 400   IV Piggyback 100 100   Total Intake(mL/kg) 1300 (20) 500 (7.7)   Urine (mL/kg/hr) 800 (0.5) 350 (0.6)   Drains 46 11   Stool 550    Total Output 1396 361   Net -96 +139        Urine Occurrence 3 x    Stool Occurrence 1 x     Awake, alert Not speaking this am, per RN did say her name earlier Moves LUE/LLE purposefully, intermittently FC No spontaneous movement RUE/RLE Wound c/d/i EVD in place, blood tinged CSF  LABS: Lab Results  Component Value Date   CREATININE 0.74 08/21/2017   BUN 20 08/21/2017   NA 151 (H) 08/21/2017   K 3.5 08/21/2017   CL 118 (H) 08/21/2017   CO2 23 08/21/2017   Lab Results  Component Value Date   WBC 15.1 (H) 08/21/2017   HGB 10.3 (L) 08/21/2017   HCT 32.5 (L) 08/21/2017   MCV 92.9 08/21/2017   PLT 306 08/21/2017    IMPRESSION: - 62 y.o. female SAHd# 8, POD#7 s/p clipping left pcom with choroidal stroke. Neurologically stable with right hemiplegia  PLAN: - Cont supportive care - Will challenge EVD again tomorrow - Cont therapy, will likely need CIR when stable for d/c which will likely be early next week depending on need for permanent CSF shunt.

## 2017-08-21 NOTE — Progress Notes (Signed)
Inpatient Rehabilitation Admissions Coordinator  I assessed patient at bedside. No family present. I contacted daughter, [name], by phone to begin discussions concerning goals and expectations of any rehab. 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 Patient lives with another daughter, [name], who is a CNA that works first shift. Patient is married for 5 to 6 years but separated. I discussed with [name], that patient's [place] legal decision maker is patient's spouse and then children of equal value, unless designated POA made pta. I offered to meet with patient's spouse and 3 children to begin discussions concerning her rehab options. [name] to contact pt's spouse and her other children to arrange a meeting for possible tomorrow. [name] also to clarify medical insurance coverage with pt's employer. I will follow up tomorrow.   Ottie Glazier, RN, MSN Rehab Admissions Coordinator [phone] 08/21/2017 11:31 AM

## 2017-08-21 NOTE — Progress Notes (Signed)
Physical Therapy Treatment Patient Details Name: Carla Bauer MRN: [id number] DOB: 07/05/1955 Today's Date: 08/21/2017    History of Present Illness pt is a 62 y/o female with no significant medical history admitted with R sided weakness.  Pt s/p L crani for aneurysm clipping, evacuation of left temporal hematoma and placement of right frontal ventricular drain.  Pt remained intubated post crani, CT showing new left thalamic/posterior internal capsule and optic radiation infarcts.`    PT Comments    Pt making slow progress.  Emphasis on transitions, sitting balance, drawing attention to the R side, sit to stand and transfers bed to chair.    Follow Up Recommendations  CIR     Equipment Recommendations  (TBA)    Recommendations for Other Services Rehab consult     Precautions / Restrictions Precautions Precautions: Fall    Mobility  Bed Mobility Overal bed mobility: Needs Assistance Bed Mobility: Rolling;Sidelying to Sit Rolling: Max assist Sidelying to sit: Max assist;+2 for physical assistance       General bed mobility comments: minimal L UE assist by pt with significant truncal stability/assist  Transfers Overall transfer level: Needs assistance Equipment used: 2 person hand held assist Transfers: Sit to/from [employer] Sit to Stand: Max assist;+2 physical assistance   Squat pivot transfers: Max assist;+2 physical assistance     General transfer comment: cues for direction.  Assist of 2 to help pt forward and upright.  Significant truncal and R LE assist throughout squat transfer.  Ambulation/Gait             General Gait Details: NT   Stairs             Wheelchair Mobility    Modified Rankin (Stroke Patients Only) Modified Rankin (Stroke Patients Only) Pre-Morbid Rankin Score: No symptoms Modified Rankin: Severe disability     Balance     Sitting balance-Leahy Scale: Poor Sitting balance - Comments: worked on  balance at EOB and in addition tried to draw her attention to the R.       Standing balance-Leahy Scale: Poor                              Cognition Arousal/Alertness: Lethargic;Awake/alert Behavior During Therapy: Flat affect;WFL for tasks assessed/performed Overall Cognitive Status: Impaired/Different from baseline Area of Impairment: Orientation;Attention;Memory;Following commands;Awareness;Safety/judgement;Problem solving                   Current Attention Level: Focused   Following Commands: Follows one step commands inconsistently Safety/Judgement: Decreased awareness of safety;Decreased awareness of deficits Awareness: Intellectual Problem Solving: Slow processing;Requires verbal cues;Requires tactile cues;Difficulty sequencing;Decreased initiation General Comments: Pt with signficant communication deficits.  She will follow some one step motor commands infrequently with multi modal cues.  She gave one 1 word response throughout the session today.      Exercises      General Comments        Pertinent Vitals/Pain Pain Assessment: Faces Faces Pain Scale: Hurts a little bit Pain Location: L knee Pain Descriptors / Indicators: Grimacing;Guarding Pain Intervention(s): Monitored during session    Home Living                      Prior Function            PT Goals (current goals can now be found in the care plan section) Acute Rehab PT Goals Patient Stated Goal: pt unable  to state.  pt's children interested in rehab. PT Goal Formulation: With patient/family Time For Goal Achievement: 09/02/17 Potential to Achieve Goals: Fair Progress towards PT goals: Progressing toward goals    Frequency    Min 3X/week      PT Plan Current plan remains appropriate    Co-evaluation              AM-PAC PT "6 Clicks" Daily Activity  Outcome Measure  Difficulty turning over in bed (including adjusting bedclothes, sheets and blankets)?:  Unable Difficulty moving from lying on back to sitting on the side of the bed? : Unable Difficulty sitting down on and standing up from a chair with arms (e.g., wheelchair, bedside commode, etc,.)?: Unable Help needed moving to and from a bed to chair (including a wheelchair)?: Total Help needed walking in hospital room?: Total Help needed climbing 3-5 steps with a railing? : Total 6 Click Score: 6    End of Session   Activity Tolerance: Patient limited by fatigue Patient left: in chair;with chair alarm set;with call bell/phone within reach;Other (comment)(placed maxisky pad under pt.) Nurse Communication: Mobility status PT Visit Diagnosis: Hemiplegia and hemiparesis Hemiplegia - Right/Left: Right Hemiplegia - dominant/non-dominant: Dominant Hemiplegia - caused by: Cerebral infarction     Time: 1355-1429 PT Time Calculation (min) (ACUTE ONLY): 34 min  Charges:  $Therapeutic Activity: 23-37 mins                    G Codes:       09/04/2017   Bing, PT [phone]  (pager)   Eliseo Gum Teoman Giraud 04-Sep-2017, 7:15 PM

## 2017-08-21 NOTE — Progress Notes (Signed)
STROKE TEAM PROGRESS NOTE   INTERVAL HISTORY No family is at the bedside. More awake, alert than yesterday, still not following commands or repeat sentence today. Still has intermittent fever. Ttachycardia and tachypnea improved. But neuro otherwise no significant change. EVD patent   Vitals:   08/21/17 1000 08/21/17 1100 08/21/17 1200 08/21/17 1221  BP: 119/71 127/86 134/89   Pulse: 93 93 94   Resp: (!) 22 (!) 25 (!) 22   Temp: 98.8 F (37.1 C)   99.9 F (37.7 C)  TempSrc: Axillary   Axillary  SpO2: 98% 100% 98%   Weight:      Height:        CBC:  Recent Labs  Lab 08/17/17 0454  08/20/17 0352 08/21/17 0311  WBC 19.3*   < > 15.4* 15.1*  NEUTROABS 15.0*  --   --  10.3*  HGB 9.2*   < > 10.6* 10.3*  HCT 27.1*   < > 33.1* 32.5*  MCV 87.7   < > 91.4 92.9  PLT 227   < > 300 306   < > = values in this interval not displayed.    Basic Metabolic Panel:  Recent Labs  Lab 08/16/17 0316  08/18/17 0241  08/20/17 0352 08/21/17 0311  NA 145   < > 149*   < > 147* 151*  K 3.0*   < > 3.5   < > 3.3* 3.5  CL 114*   < > 115*   < > 112* 118*  CO2 23   < > 23   < > 24 23  GLUCOSE 159*   < > 189*   < > 189* 196*  BUN 5*   < > 8   < > 13 20  CREATININE 0.57   < > 0.65   < > 0.70 0.74  CALCIUM 8.6*   < > 9.7   < > 9.7 9.8  MG 2.0  --  1.9  --   --   --    < > = values in this interval not displayed.   Lipid Panel:     Component Value Date/Time   CHOL 120 08/16/2017 0316   TRIG 77 08/16/2017 0316   HDL 53 08/16/2017 0316   CHOLHDL 2.3 08/16/2017 0316   VLDL 15 08/16/2017 0316   LDLCALC 52 08/16/2017 0316   HgbA1c:  Lab Results  Component Value Date   HGBA1C 6.0 (H) 08/15/2017   Urine Drug Screen:     Component Value Date/Time   LABOPIA NONE DETECTED 08/14/2017 0644   COCAINSCRNUR NONE DETECTED 08/14/2017 0644   LABBENZ NONE DETECTED 08/14/2017 0644   AMPHETMU NONE DETECTED 08/14/2017 0644   THCU NONE DETECTED 08/14/2017 0644   LABBARB NONE DETECTED 08/14/2017 0644     Alcohol Level     Component Value Date/Time   ETH <10 08/14/2017 0647    IMAGING I have personally reviewed the radiological images below and agree with the radiology interpretations.  Ct Angio Head W Or Wo Contrast  Result Date: 08/16/2017 CLINICAL DATA:  62 y/o F; cerebral aneurysm and subarachnoid hemorrhage. Evaluation for cerebral vasospasm. EXAM: CT ANGIOGRAPHY HEAD TECHNIQUE: Multidetector CT imaging of the head was performed using the standard protocol during bolus administration of intravenous contrast. Multiplanar CT image reconstructions and MIPs were obtained to evaluate the vascular anatomy. CONTRAST:  50 cc Isovue 370 COMPARISON:  08/14/2017 CT angiogram of the head and CT of the head. FINDINGS: CT HEAD Brain: Mild interval decrease in volume  of intraventricular hemorrhage. Stable subdural collection over the left anterior frontal convexity subjacent to frontal and temporal craniotomy. Near complete resolution of pneumocephalus. Decreased mass effect with 7 mm of left-to-right midline shift, previously 9 mm. Increased hypoattenuation within the left medial temporal lobe extending into the left posterior lentiform nucleus and posterior limb of internal capsule likely representing evolving early subacute infarction. No new acute intracranial hemorrhage or herniation. Right frontal approach ventriculostomy catheter is stable in position. No hydrocephalus. Vascular: As below. Skull: Postsurgical changes related to a left frontal and temporal craniotomy with mildly increased edema, but decreased air in the overlying scalp. Sinuses: Imaged portions are clear. Orbits: No acute finding. CTA HEAD Anterior circulation: No significant stenosis, proximal occlusion, new aneurysm, or vascular malformation. Clips left posterior communicating artery aneurysm without residual or recurrent aneurysm identified. Posterior circulation: No significant stenosis, proximal occlusion, aneurysm, or vascular  malformation. Venous sinuses: As permitted by contrast timing, patent. Anatomic variants: None significant. Delayed phase: No abnormal intracranial enhancement. IMPRESSION: CT head: 1. Increasing hypoattenuation within the left medial temporal lobe extending to left posterior lentiform nucleus and posterior limb of internal capsule compatible developing early subacute infarction. 2. Mild decrease in volume of intraventricular hemorrhage. 3. Stable subdural collection over the left anterior frontal convexity subjacent to the left frontal and temporal craniotomy. 4. Decreased mass effect with 7 mm of left-to-right midline shift, previously 9 mm. CTA head: 1. No findings of vasospasm. No new large vessel occlusion, aneurysm, or significant stenosis. 2. Left posterior communicating artery aneurysm clip. No residual or recurrent aneurysm identified. Electronically Signed   By: Mitzi Hansen M.D.   On: 08/16/2017 02:20   Ct Angio Head W Or Wo Contrast  Result Date: 08/14/2017 CLINICAL DATA:  Intracranial hemorrhage EXAM: CT ANGIOGRAPHY HEAD AND NECK TECHNIQUE: Multidetector CT imaging of the head and neck was performed using the standard protocol during bolus administration of intravenous contrast. Multiplanar CT image reconstructions and MIPs were obtained to evaluate the vascular anatomy. Carotid stenosis measurements (when applicable) are obtained utilizing NASCET criteria, using the distal internal carotid diameter as the denominator. CONTRAST:  50 mL ISOVUE-370 IOPAMIDOL (ISOVUE-370) INJECTION 76% COMPARISON:  CT head 08/14/2017 FINDINGS: CTA NECK FINDINGS Aortic arch: Normal aortic arch and proximal great vessels. Right carotid system: Right carotid system widely patent. Bifurcation widely patent. Left carotid system: Left carotid system widely patent. Left carotid bifurcation patent. Vertebral arteries: Both vertebral arteries widely patent without stenosis or irregularity. Skeleton: Degenerative  changes in the cervical spine with spondylosis and anterolisthesis C3-4. No acute skeletal abnormality. Dental caries. Other neck: Negative for mass or adenopathy in the neck. Upper chest: Visualized lung apices clear. Review of the MIP images confirms the above findings CTA HEAD FINDINGS Anterior circulation: Right cavernous carotid widely patent. Right anterior and middle cerebral arteries widely patent Left cavernous carotid widely patent. 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 This apex extends into the left temporal hematoma. Hematoma felt to be due to rupture of this aneurysm. Left anterior and middle cerebral arteries widely patent without stenosis or vasospasm. Posterior circulation: Both vertebral arteries patent to the basilar. Basilar widely patent. Right PICA patent. Left PICA not visualized. Dominant left AICA. Superior cerebellar and posterior cerebral arteries patent bilaterally without stenosis. No aneurysm in the posterior circulation. Venous sinuses: Normal venous enhancement Anatomic variants: None Delayed phase: Left temporal hematoma unchanged. Subdural hematoma left frontal lobe and along the tentorium on the  left also unchanged. Intraventricular hemorrhage unchanged. Mild hydrocephalus. 11 mm midline shift to the right unchanged. No new hemorrhage since the CT earlier today. 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 Extensive intracranial hemorrhage is stable from earlier this morning. No other aneurysm. No significant atherosclerotic disease. These results were called by telephone at the time of interpretation on 08/14/2017 at 10:37 am to Dr. Conchita Paris , who verbally acknowledged these results. Electronically Signed   By: Marlan Palau  M.D.   On: 08/14/2017 10:37   Ct Head Wo Contrast  Result Date: 08/14/2017 CLINICAL DATA:  62 y/o  F; 08/14/2017 CT head and CTA head. EXAM: CT HEAD WITHOUT CONTRAST TECHNIQUE: Contiguous axial images were obtained from the base of the skull through the vertex without intravenous contrast. COMPARISON:  08/14/2017 CT head and CTA head. FINDINGS: Brain: Interval left frontal and temporal craniotomy postsurgical changes with air and edema in the overlying scalp. Small volume pneumocephalus overlying the frontal lobes. Interval placement of a left posterior communicating artery aneurysm clip in the left suprasellar cistern. Interval evacuation of hematoma within the left anterior temporal lobe with small residual fluid-filled cavity. Stable volume of intraventricular hemorrhage within the left greater than right lateral ventricles, third ventricle, and fourth ventricle. Stable small volume of hemorrhage overlying the left tentorium cerebelli. Interval placement of a right frontal approach ventriculostomy catheter with tip in the frontal horn of right lateral ventricle. Stable ventricle size. Decreased left-to-right midline shift of 9 mm, previously 11 mm. No downward herniation. Vascular: Interval placement of left suprasellar cistern aneurysm clips. Skull: Postsurgical changes related to left frontal and temporal craniotomy with air and edema in the overlying scalp as well as skin staples. Sinuses/Orbits: No acute finding. Other: None. IMPRESSION: 1. Interval left frontal and temporal craniotomy, left anterior temporal lobe hematoma evacuation, right frontal approach ventriculostomy catheter, and clipping of left posterior communicating artery aneurysm. 2. Associated postsurgical changes with air and edema in the overlying scalp and small volume of pneumocephalus. 3. Stable residual intraventricular hemorrhage and subdural along the left tentorium cerebelli. 4. No new acute intracranial abnormality. 5. Decreased  mass effect with 9 mm left-to-right midline shift, previously 11 mm. Electronically Signed   By: Mitzi Hansen M.D.   On: 08/14/2017 23:20   Ct Angio Neck W Or Wo Contrast  Result Date: 08/14/2017 CLINICAL DATA:  Intracranial hemorrhage EXAM: CT ANGIOGRAPHY HEAD AND NECK TECHNIQUE: Multidetector CT imaging of the head and neck was performed using the standard protocol during bolus administration of intravenous contrast. Multiplanar CT image reconstructions and MIPs were obtained to evaluate the vascular anatomy. Carotid stenosis measurements (when applicable) are obtained utilizing NASCET criteria, using the distal internal carotid diameter as the denominator. CONTRAST:  50 mL ISOVUE-370 IOPAMIDOL (ISOVUE-370) INJECTION 76% COMPARISON:  CT head 08/14/2017 FINDINGS: CTA NECK FINDINGS Aortic arch: Normal aortic arch and proximal great vessels. Right carotid system: Right carotid system widely patent. Bifurcation widely patent. Left carotid system: Left carotid system widely patent. Left carotid bifurcation patent. Vertebral arteries: Both vertebral arteries widely patent without stenosis or irregularity. Skeleton: Degenerative changes in the cervical spine with spondylosis and anterolisthesis C3-4. No acute skeletal abnormality. Dental caries. Other neck: Negative for mass or adenopathy in the neck. Upper chest: Visualized lung apices clear. Review of the MIP images confirms the above findings CTA HEAD FINDINGS Anterior circulation: Right cavernous  carotid widely patent. Right anterior and middle cerebral arteries widely patent Left cavernous carotid widely patent. 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 This apex extends into the left temporal hematoma. Hematoma felt to be due to rupture of this aneurysm. Left anterior and middle cerebral arteries widely patent without stenosis or vasospasm. Posterior circulation: Both  vertebral arteries patent to the basilar. Basilar widely patent. Right PICA patent. Left PICA not visualized. Dominant left AICA. Superior cerebellar and posterior cerebral arteries patent bilaterally without stenosis. No aneurysm in the posterior circulation. Venous sinuses: Normal venous enhancement Anatomic variants: None Delayed phase: Left temporal hematoma unchanged. Subdural hematoma left frontal lobe and along the tentorium on the left also unchanged. Intraventricular hemorrhage unchanged. Mild hydrocephalus. 11 mm midline shift to the right unchanged. No new hemorrhage since the CT earlier today. 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 Extensive intracranial hemorrhage is stable from earlier this morning. No other aneurysm. No significant atherosclerotic disease. These results were called by telephone at the time of interpretation on 08/14/2017 at 10:37 am to Dr. Conchita Paris , who verbally acknowledged these results. Electronically Signed   By: Marlan Palau M.D.   On: 08/14/2017 10:37   Dg Chest Port 1 View  Result Date: 08/17/2017 CLINICAL DATA:  Acute respiratory failure with hypoxia EXAM: PORTABLE CHEST 1 VIEW COMPARISON:  None. FINDINGS: Nasoenteric feeding tube at least reaches the stomach. Extensive artifact from EKG leads. Linear opacity at the right base favoring atelectasis. There is low volumes with interstitial crowding. No Kerley lines, effusion, or pneumothorax. Borderline cardiomegaly, accentuated by technique. IMPRESSION: Low volumes with mild atelectasis on the right. Electronically Signed   By: Marnee Spring M.D.   On: 08/17/2017 08:18   TTE  - Left ventricle: The cavity size was normal. Systolic function was   vigorous. The estimated ejection fraction was in the range of 65%   to 70%. Wall motion was  normal; there were no regional wall   motion abnormalities. Doppler parameters are consistent with   abnormal left ventricular relaxation (grade 1 diastolic   dysfunction). There was no evidence of elevated ventricular   filling pressure by Doppler parameters. - Aortic valve: Trileaflet; normal thickness leaflets. Valve area   (VTI): 2.01 cm^2. Valve area (Vmax): 2.18 cm^2. Valve area   (Vmean): 2.06 cm^2. - Aortic root: The aortic root was normal in size. - Mitral valve: There was mild regurgitation. - Right ventricle: Systolic function was normal. - Right atrium: The atrium was normal in size. - Tricuspid valve: There was moderate regurgitation. - Pulmonic valve: There was no regurgitation. - Pulmonary arteries: Systolic pressure was moderately increased.   PA peak pressure: 48 mm Hg (S). - Inferior vena cava: The vessel was normal in size. - Pericardium, extracardiac: There was no pericardial effusion.  Ct Head Wo Contrast  Result Date: 08/19/2017 CLINICAL DATA:  Follow-up examination for intracranial hemorrhage. EXAM: CT HEAD WITHOUT CONTRAST TECHNIQUE: Contiguous axial images were obtained from the base of the skull through the vertex without intravenous contrast. COMPARISON:  Prior CT from 08/18/2017 FINDINGS: Brain: Postoperative changes from prior left frontotemporal craniotomy for surgical clipping of left P com aneurysm again seen. Continued interval decrease in blood products at the anterior left temporal lobe, now only faintly visible. Extra-axial collection overlying the  left frontotemporal convexity is unchanged without significant mass effect. Small volume subdural hemorrhage along the left tentorium relatively unchanged as well. 5 mm of left-to-right midline shift is stable. Right frontal approach ventriculostomy catheter in place with tip in the right lateral ventricle. Intraventricular hemorrhage is slightly decreased from previous. Stable ventricular size. No hydrocephalus or  ventricular trapping. Evolving subacute infarct at the lateral left thalamus/posterior limb of the left internal capsule with inferior extension again noted, stable. No new intracranial hemorrhage or large vessel territory infarct. No other new acute intracranial abnormality. Atrophy with chronic small vessel ischemic changes again noted. Vascular: Aneurysm clip in the region of the left posterior communicating artery. No hyperdense vessel. Skull: Postoperative changes from left frontotemporal craniotomy. Skin staples remain in place. Overlying scalp edema and swelling is increased from previous. Sinuses/Orbits: No acute finding about the globes and orbital soft tissues. Paranasal sinuses remain clear. Nasogastric tube partially visualized. No significant mastoid effusion. Other: None. IMPRESSION: 1. Postoperative changes from prior left frontotemporal craniotomy for surgical clipping of left P com aneurysm and left temporal hematoma evacuation. Hemorrhage at the anterior left temporal lobe continues to decrease. 5 mm of left-to-right shift stable from previous. 2. Right frontal approach ventriculostomy in place with continued interval decrease in intraventricular hemorrhage. No hydrocephalus or ventricular trapping. 3. Unchanged small volume subdural hemorrhage along the left tentorium. 4. Continued interval evolution of subacute ischemic infarct at the lateral left thalamus/posterior limb of the left internal capsule. 5. No other new acute intracranial abnormality. Electronically Signed   By: Rise Mu M.D.   On: 08/19/2017 06:22   Dg Chest Port 1 View  Result Date: 08/19/2017 CLINICAL DATA:  Fever EXAM: PORTABLE CHEST 1 VIEW COMPARISON:  Portable exam 1208 hours compared to 08/18/2017 FINDINGS: Feeding tube traverses esophagus into stomach. Normal heart size, mediastinal contours, and pulmonary vascularity. Mild bibasilar atelectasis. Lungs otherwise clear. No infiltrate, pleural effusion or  pneumothorax. Bones unremarkable. IMPRESSION: Mild bibasilar atelectasis. Electronically Signed   By: Ulyses Southward M.D.   On: 08/19/2017 12:32   Dg Chest Port 1 View  Result Date: 08/20/2017 CLINICAL DATA:  Leukocytosis EXAM: PORTABLE CHEST 1 VIEW COMPARISON:  Chest x-ray of 08/19/2017 FINDINGS: Linear atelectasis or scarring remains at the right lung base. No pneumonia or effusion is seen. Mediastinal and hilar contours are unremarkable and the heart is within upper limits of normal. A feeding tube extends below the hemidiaphragm. IMPRESSION: No change in linear atelectasis or scarring at the right lung base. No active lung disease. Electronically Signed   By: Dwyane Dee M.D.   On: 08/20/2017 11:31    TCD 08/16/17 - no vasospasm  TCD 08/19/17 - no vasospasm  TCD pending   PHYSICAL EXAM Temp:  [98.3 F (36.8 C)-101.4 F (38.6 C)] 99.9 F (37.7 C) (05/22 1221) Pulse Rate:  [80-123] 94 (05/22 1200) Resp:  [19-30] 22 (05/22 1200) BP: (100-173)/(66-99) 134/89 (05/22 1200) SpO2:  [96 %-100 %] 98 % (05/22 1200) Weight:  [143 lb 1.3 oz (64.9 kg)] 143 lb 1.3 oz (64.9 kg) (05/22 0436)  General - Well nourished, well developed, not in acute distress  Ophthalmologic - Fundi not visualized due to noncooperation.  Cardiovascular - Regular rhythm, but tachycardia.  Neuro - Awake, alert, eyes open on the left and with ptosis on the right. Left pupil 3.77mm and right pupil 3mm, reactive to light bilaterally, left gaze preference and not able to cross midline. Blinking to visual threat to the left but not to the right.  Right facial droop and tongue midline. Pt was not able to follow simple command today and not able to repeat simple sentences, but able to repeat single words. LUE 4/5, LLE 2+/5 on pain, RUE and RLE 0/5 on pain stimulation. DTR 1+ and no babinski. Sensation, coordination not cooperative and gait not tested.   ASSESSMENT/PLAN Carla Bauer is a 62 y.o. female with no significant  history found having fallen out of the bed with R arm weakness and altered mental status. 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  Left temporal lobe ICH w IVH/SAH/SDH, d/t L PCOM aneurysm rupture s/p clipping, EVD and hematoma evacuation  CT head L mesial temporal hmg 12 cc w/ IVH. Surrounding edema with L to R shift 11mm. 6mm SDH along L convexity. ASPECTS 10.   CTA head & neck LPCom aneurysm c/w rupture into L temporal lobe. Extensive hmg stable from earlier.  S/p crani w/ clipping of L PCom aneurysm, hematoma evacutaion and EVD placement 5/15 (Nundkumar)  CT head repeat interval L frontal and temp crani, L ant temporal lobe hematoma evac w/ R fronal appoach, clip L PCom. Post OR changes w/ air and edema. Stable IVH and SDH. No new abn. Decreased mass effect to 9mm L to R  CTA repeat showed left PCOM s/p clip and left temporal ICH evacuation, however, new left thalamus and left optic radiation infarcts  CT repeat x 2 stable hematoma and no hydro  TCD MWF - no vasospasm x 2  On nimonidpine  On TF /hr  subq heparin for VTE prophylaxis  No antithrombotic prior to admission (Goody Powders prn).   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/PCA/AchA manipulation with PCOM aneurysm clipping  Resultant - right hemiplegia  CTA repeat - patent left PCA and MCA  CT repeat x 2 - stable IVH and no hydro  May consider MRI once off EVD drain  On ASA  for stroke prevention  PT/OT - CIR  LDL 52   A1C 6.0  Left ptosis   Left ptosis with mildly larger left pupil  Likely mild injury of left CNIII with left PCOM aneurysm clipping  Respiratory distress, improved  Intubated for crani  extubate 08/16/17   Still tachypnea  CXR x 2 mild bibasilar atelectasis  On mucomyst nebs  PCCM  following  Hydrocephalus  S/p EVD  Not tolerating EVD clamping  EVD drain patent  CT stable no hydro  Per NSG, will again clamp EVD later this week  Fever  WBC 12.5->19.9->19.3->16.8->15.5->15.4  Tmax 100.5->101.9->102.5->100.0  UA WBC 6-10  Urine culture G- rods -> on rocephin 08/19/17  Blood culture NGTD  Consider CSF sampling if needed  CXR bibasilar atelectasis  Management per CCM  Tachycardia - on metoprolol  bid - improved  Blood pressure  controlled  SBP goal < 160  off cardene drip  On metoprolol  bid->50mg  bid  Labetalol PRN  Other Stroke Risk Factors  No family hx aneurysms  Other Active Problems  Hypokalemia 3.3 -> 3.0->2.8->3.5->3.4->3.3->3.5, supplement   Hospital day # 7  This patient is critically ill due to ICH, SAH, IVH, PCOM aneurysm, s/p hematoma evacuation, EVD, fever, leukocytosis and aneurysm clipping and at significant risk of neurological worsening, death form re-bleed, hydrocephalus, seizure, cerebral edema and brain herniation. This patient's care requires constant monitoring of vital  signs, hemodynamics, respiratory and cardiac monitoring, review of multiple databases, neurological assessment, discussion with family, other specialists and medical decision making of high complexity. I spent 30 minutes of neurocritical care time in the care of this patient.  Neurology will sign off. Please call with questions. Pt will follow up with stroke clinic NP at [facility] in about 4 weeks after discharge. Thanks for the consult.   Marvel Plan, MD PhD Stroke Neurology 08/21/2017 1:18 PM    To contact Stroke Continuity provider, please refer to [email or web address]. After hours, contact General Neurology

## 2017-08-21 NOTE — Progress Notes (Signed)
Transcranial Doppler  Date POD PCO2 HCT BP  MCA ACA PCA OPHT SIPH VERT Basilar  5/17 RS     Right  Left   72  49   -59     34  42  32              5/20 GC     Right  Left   59  49   -23     - 5/22,rs     Right  Left   70  67   -64  -73   30  41   17  22   47  34   -21  *   *            Right  Left                                             Right  Left                                            Right  Left                                            Right  Left                                        MCA = Middle Cerebral Artery      OPHT = Opthalmic Artery     BASILAR = Basilar Artery   ACA = Anterior Cerebral Artery     SIPH = Carotid Siphon PCA = Posterior Cerebral Artery   VERT = Verterbral Artery                   Normal MCA = 62+\-12 ACA = 50+\-12 PCA = 42+\-23   08/21/17 - (*) not insonated.

## 2017-08-21 NOTE — Progress Notes (Signed)
PULMONARY / CRITICAL CARE MEDICINE   Name: SHAVON ASHMORE MRN: [id number] DOB: [birth year]    ADMISSION DATE:  08/14/2017 CONSULTATION DATE:  08/14/2017  REFERRING MD:  Patric Dykes - CNSA.   CHIEF COMPLAINT:  Aneurysmal subarachnoid hemorrhage.   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  Remained on vent post op.  SUBJECTIVE:   This is day 8 S/P SAH.  EVD remains open at +10.  She is not verbally interactive with me.  VITAL SIGNS: BP 137/88 (BP Location: Right Arm)   Pulse 99   Temp (!) 101.4 F (38.6 C) (Axillary)   Resp (!) 26   Ht  (1.549 m)   Wt 143 lb 1.3 oz (64.9 kg)   SpO2 98%   BMI 27.03 kg/m   INTAKE / OUTPUT: I/O last 3 completed shifts: In: 2100 [NG/GT:2000; IV Piggyback:100] Out: 2218 [Urine:1400; Drains:93; Stool:725]  PHYSICAL EXAMINATION: General: Not responding to verbal cues.  Moves left extremities to noxious stimuli but not the right.  Pupils are equal.      HEENT: Craniotomy site is well opposed and dry without drainage.  No difficulty in maintaining airway. Neuro: As above  CV: S1 and S2 are regular without murmur rub or gallop   PULM: Respirations are unlabored, there is symmetric air movement and no wheezes.  She is not having any difficulty maintaining an airway there is no snoring or stridor.      GI: The abdomen is obese and soft without any organomegaly masses tenderness guarding or rebound   Extremities: warm/dry, no sig edema  Skin: no rashes or lesions   LABS:  BMET Recent Labs  Lab 08/19/17 0930 08/20/17 0352 08/21/17 0311  NA 148* 147* 151*  K 3.4* 3.3* 3.5  CL 114* 112* 118*  CO2 BUN CREATININE 0.57 0.70 0.74  GLUCOSE 190* 189* 196*    Electrolytes Recent Labs  Lab 08/16/17 0316  08/18/17 0241 08/19/17 0930 08/20/17 0352 08/21/17 0311  CALCIUM 8.6*   < >  9.7 9.5 9.7 9.8  MG 2.0  --  1.9  --   --   --    < > = values in this interval not displayed.    CBC Recent Labs  Lab 08/19/17 1048 08/20/17 0352 08/21/17 0311  WBC 15.5* 15.4* 15.1*  HGB 10.3* 10.6* 10.3*  HCT 32.1* 33.1* 32.5*  PLT 289 300 306    Coag's No results for input(s): APTT, INR in the last 168 hours.  Sepsis Markers Recent Labs  Lab 08/19/17 0930 08/20/17 0352 08/21/17 0311  PROCALCITON 0.23 0.21 0.19    ABG No results for input(s): PHART, PCO2ART, PO2ART in the last 168 hours.  Liver Enzymes No results for input(s): AST, ALT, ALKPHOS, BILITOT, ALBUMIN in the last 168 hours.  Cardiac Enzymes No results for input(s): TROPONINI, PROBNP in the last 168 hours.  Glucose Recent Labs  Lab 08/20/17 1120 08/20/17 1724 08/20/17 2010 08/20/17 2344 08/21/17 0331 08/21/17 0731  GLUCAP 233* 166* 174* 286* 195* 184*    Imaging Dg Chest Port 1 View  Result Date: 08/20/2017 CLINICAL DATA:  Leukocytosis EXAM: PORTABLE CHEST 1 VIEW COMPARISON:  Chest x-ray of 08/19/2017 FINDINGS: Linear atelectasis or scarring remains at the right lung base. No pneumonia or effusion is seen. Mediastinal and hilar contours are unremarkable and  the heart is within upper limits of normal. A feeding tube extends below the hemidiaphragm. IMPRESSION: No change in linear atelectasis or scarring at the right lung base. No active lung disease. Electronically Signed   By: Dwyane Dee M.D.   On: 08/20/2017 11:31     STUDIES:  CT Head 5/17 >> increasing hypoattenuation within the left medial temporal lobe extending to left posterior lentiform nucleus & posterior limb of internal capsule consistent with developing early subacute infarction.  Mild decrease in intraventricular hemorrhage, decreased mass effect at 7mm, decreased air CTA Head 5/17 >> no findings of vasospasm, no new large vessel occlusion, left posterior communicating artery aneurysm clip Echo 5/17 >> EF 65 to 70%, grade 1 DD,  mild MR, mod TR, PAS 48 mmHg  CULTURES: Urine of 5/20 is growing greater than 10 to the fifth pansensitive E. coli ANTIBIOTICS: Rocephin started 5/21 SIGNIFICANT EVENTS: 5/15 Clipping PCOM aneurysm and clot evacuation.  2 units PRBC's intra-op  LINES/TUBES: EVD 5/15 >>  A-Line 5/15 >> out ETT 5/15 >> 5/17  DISCUSSION: No issues after extubation 5/17.  Neurosurgery planning for f/u CT head 5/19.  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  Mental status remains tenuous but currently protecting airway.  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 PLAN -  Off cardene  goal SBP < 160 PRN labetalol  Continue per tube metoprolol   Hypokalemia. Hypernatremia  PLAN -  KVO IVF  F/u chem in am  Replete K PRN   Hyperglycemia. PLAN -  SSI   Anemia of critical illness. - stable.  No s/s bleeding  PLAN -  F/u CBC  SCD's    DVT prophylaxis - SCD Nutrition - tube feeds Goals of care - full code     Penny Pia, MD 08/21/2017  10:10 AM Pager: [phone] or [phone]

## 2017-08-22 ENCOUNTER — Inpatient Hospital Stay (HOSPITAL_COMMUNITY): Payer: Medicaid Other

## 2017-08-22 LAB — CBC WITH DIFFERENTIAL/PLATELET
ABS IMMATURE GRANULOCYTES: 0.2 10*3/uL — AB (ref 0.0–0.1)
Basophils Absolute: 0.1 10*3/uL (ref 0.0–0.1)
Basophils Relative: 1 %
EOS ABS: 0 10*3/uL (ref 0.0–0.7)
Eosinophils Relative: 0 %
HEMATOCRIT: 33 % — AB (ref 36.0–46.0)
HEMOGLOBIN: 10.4 g/dL — AB (ref 12.0–15.0)
IMMATURE GRANULOCYTES: 1 %
LYMPHS PCT: 18 %
Lymphs Abs: 3.2 10*3/uL (ref 0.7–4.0)
MCH: 29.4 pg (ref 26.0–34.0)
MCHC: 31.5 g/dL (ref 30.0–36.0)
MCV: 93.2 fL (ref 78.0–100.0)
MONOS PCT: 9 %
Monocytes Absolute: 1.7 10*3/uL — ABNORMAL HIGH (ref 0.1–1.0)
NEUTROS PCT: 71 %
Neutro Abs: 12.7 10*3/uL — ABNORMAL HIGH (ref 1.7–7.7)
Platelets: 332 10*3/uL (ref 150–400)
RBC: 3.54 MIL/uL — AB (ref 3.87–5.11)
RDW: 14.6 % (ref 11.5–15.5)
WBC: 17.8 10*3/uL — AB (ref 4.0–10.5)

## 2017-08-22 LAB — CSF CELL COUNT WITH DIFFERENTIAL
Eosinophils, CSF: 0 % (ref 0–1)
Lymphs, CSF: 3 % — ABNORMAL LOW (ref 40–80)
Monocyte-Macrophage-Spinal Fluid: 1 % — ABNORMAL LOW (ref 15–45)
RBC Count, CSF: 15000 /mm3 — ABNORMAL HIGH
Segmented Neutrophils-CSF: 96 % — ABNORMAL HIGH (ref 0–6)
WBC, CSF: 80 /mm3 (ref 0–5)

## 2017-08-22 LAB — BASIC METABOLIC PANEL
Anion gap: 11 (ref 5–15)
BUN: 18 mg/dL (ref 6–20)
CO2: 20 mmol/L — AB (ref 22–32)
CREATININE: 0.78 mg/dL (ref 0.44–1.00)
Calcium: 9.9 mg/dL (ref 8.9–10.3)
Chloride: 124 mmol/L — ABNORMAL HIGH (ref 101–111)
GFR calc non Af Amer: >60 mL/min (ref >60)
Glucose, Bld: 154 mg/dL — ABNORMAL HIGH (ref 65–99)
POTASSIUM: 3.4 mmol/L — AB (ref 3.5–5.1)
Sodium: 155 mmol/L — ABNORMAL HIGH (ref 135–145)

## 2017-08-22 LAB — GLUCOSE, CAPILLARY
GLUCOSE-CAPILLARY: 130 mg/dL — AB (ref 65–99)
GLUCOSE-CAPILLARY: 193 mg/dL — AB (ref 65–99)
Glucose-Capillary: 249 mg/dL — ABNORMAL HIGH (ref 65–99)
Glucose-Capillary: 222 mg/dL — ABNORMAL HIGH (ref 65–99)
Glucose-Capillary: 179 mg/dL — ABNORMAL HIGH (ref 65–99)
Glucose-Capillary: 202 mg/dL — ABNORMAL HIGH (ref 65–99)

## 2017-08-22 LAB — PROTEIN AND GLUCOSE, CSF
GLUCOSE CSF: 131 mg/dL — AB (ref 40–70)
TOTAL PROTEIN, CSF: 91 mg/dL — AB (ref 15–45)

## 2017-08-22 LAB — PROCALCITONIN: Procalcitonin: 0.15 ng/mL

## 2017-08-22 NOTE — Progress Notes (Signed)
  NEUROSURGERY PROGRESS NOTE   Pt seen and examined. No issues overnight.   EXAM: Temp:  [98.2 F (36.8 C)-101.5 F (38.6 C)] 99.9 F (37.7 C) (05/23 1200) Pulse Rate:  [78-107] 92 (05/23 1000) Resp:  [16-30] 22 (05/23 1000) BP: (116-165)/(73-97) 130/80 (05/23 1000) SpO2:  [94 %-100 %] 100 % (05/23 1000) Weight:  [65.8 kg (145 lb 1 oz)] 65.8 kg (145 lb 1 oz) (05/23 0500) Intake/Output      05/22 0701 - 05/23 0700 05/23 0701 - 05/24 0700   NG/GT 1300 200   IV Piggyback 100    Total Intake(mL/kg) 1400 (21.3) 200 (3)   Urine (mL/kg/hr) 1600 (1) 200 (0.5)   Drains 23 0   Stool 400    Total Output 2023 200   Net -623 0         Awake, alert Not responding verbally Follows commands LUE/LLE, protrudes tongue No movement RUE/RLE EVD in place  LABS: Lab Results  Component Value Date   CREATININE 0.78 08/22/2017   BUN 18 08/22/2017   NA 155 (H) 08/22/2017   K 3.4 (L) 08/22/2017   CL 124 (H) 08/22/2017   CO2 20 (L) 08/22/2017   Lab Results  Component Value Date   WBC 17.8 (H) 08/22/2017   HGB 10.4 (L) 08/22/2017   HCT 33.0 (L) 08/22/2017   MCV 93.2 08/22/2017   PLT 332 08/22/2017    IMPRESSION: - 62 y.o. female SAH d#9, POD# 8 s/p clipping left Pcom, stable neurologically with right hemiplegia. Febrile of unclear source.  PLAN: - EVD clamped today - CSF withdrawn, send for routine studies - Cont nimotop

## 2017-08-22 NOTE — Progress Notes (Signed)
PULMONARY / CRITICAL CARE MEDICINE   Name: Carla Bauer MRN: [id number] DOB: 01-21-1956    ADMISSION DATE:  08/14/2017 CONSULTATION DATE:  08/14/2017  REFERRING MD:  Patric Dykes - CNSA.   CHIEF COMPLAINT:  Aneurysmal subarachnoid hemorrhage.   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  Remained on vent post op.  SUBJECTIVE:   This is day 9 S/P SAH.  EVD remains open at +10.  Again she is not verbally interactive with me, however nursing staff reports that she is verbal at night.  She is febrile today and not having a cough.  TCD's yesterday were negative.  VITAL SIGNS: BP 122/73   Pulse 92   Temp (!) 101.5 F (38.6 C) Comment: tylenol given  Resp (!) 25   Ht  (1.549 m)   Wt 145 lb 1 oz (65.8 kg)   SpO2 98%   BMI 27.41 kg/m   INTAKE / OUTPUT: I/O last 3 completed shifts: In: 2000 [NG/GT:1900; IV Piggyback:100] Out: 2996 [Urine:2000; Drains:46; Stool:950]  PHYSICAL EXAMINATION: General: He is again not responding to verbal cues for me.  She is moving her left extremities spontaneously, she is not moving the right even to noxious stimuli.   Pupils are equal.      HEENT: Craniotomy site is well opposed and dry without drainage.  No difficulty in maintaining airway. Neuro: As above  CV: S1 and S2 are regular without murmur rub or gallop   PULM: Respirations are unlabored, there is symmetric air movement no wheezes and no rhonchi       GI: The abdomen is obese and soft without any organomegaly masses or tenderness   LABS:  BMET Recent Labs  Lab 08/20/17 0352 08/21/17 0311 08/22/17 0321  NA 147* 151* 155*  K 3.3* 3.5 3.4*  CL 112* 118* 124*  CO2 24 23 20*  BUN CREATININE 0.70 0.74 0.78  GLUCOSE 189* 196* 154*    Electrolytes Recent Labs  Lab 08/16/17 0316  08/18/17 0241  08/20/17 0352 08/21/17 0311  08/22/17 0321  CALCIUM 8.6*   < > 9.7   < > 9.7 9.8 9.9  MG 2.0  --  1.9  --   --   --   --    < > = values in this interval not displayed.    CBC Recent Labs  Lab 08/20/17 0352 08/21/17 0311 08/22/17 0321  WBC 15.4* 15.1* 17.8*  HGB 10.6* 10.3* 10.4*  HCT 33.1* 32.5* 33.0*  PLT 300 306 332    Coag's No results for input(s): APTT, INR in the last 168 hours.  Sepsis Markers Recent Labs  Lab 08/19/17 0930 08/20/17 0352 08/21/17 0311  PROCALCITON 0.23 0.21 0.19    ABG No results for input(s): PHART, PCO2ART, PO2ART in the last 168 hours.  Liver Enzymes No results for input(s): AST, ALT, ALKPHOS, BILITOT, ALBUMIN in the last 168 hours.  Cardiac Enzymes No results for input(s): TROPONINI, PROBNP in the last 168 hours.  Glucose Recent Labs  Lab 08/21/17 0731 08/21/17 1225 08/21/17 1628 08/21/17 1952 08/21/17 2342 08/22/17 0325  GLUCAP 184* 224* 173* 193* 286* 130*    Imaging No results found.   STUDIES:  CT Head 5/17 >> increasing hypoattenuation within the left medial temporal lobe extending to left posterior lentiform nucleus & posterior limb of internal capsule consistent  with developing early subacute infarction.  Mild decrease in intraventricular hemorrhage, decreased mass effect at 7mm, decreased air CTA Head 5/17 >> no findings of vasospasm, no new large vessel occlusion, left posterior communicating artery aneurysm clip Echo 5/17 >> EF 65 to 70%, grade 1 DD, mild MR, mod TR, PAS 48 mmHg  CULTURES: Urine of 5/20 is growing greater than 10 to the fifth pansensitive E. coli ANTIBIOTICS: Rocephin started 5/21 SIGNIFICANT EVENTS: 5/15 Clipping PCOM aneurysm and clot evacuation.  2 units PRBC's intra-op  LINES/TUBES: EVD 5/15 >>  A-Line 5/15 >> out ETT 5/15 >> 5/17  DISCUSSION: This is a 62 year old with no known past medical history resented with a right hemiplegia.  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  She has undergone craniotomy and  clipping of the aneurysm.  She continues to have an EVD in place.  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  Mental status remains tenuous but currently protecting airway.  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 R hemiplegia  PLAN -  Per neuro, nsgy  Continue nimodipine  EVD draining  F/u CT stable/improving  TCD's yesterday were negative.  Her sodium continues to creep up I am going to hold her free water again today and will recheck her sodium in the morning.   Hypertension. PLAN -  Off cardene  TCD's yesterday were negative  PRN labetalol  Continue per tube metoprolol   Hypokalemia. Hypernatremia  PLAN -  KVO IVF  F/u chem in am  Replete K PRN   Hyperglycemia. PLAN -  SSI   Fever.  Been treating a pansensitive E. coli in the urine for the past 2 days with Rocephin.  She remains febrile today with an elevated white count.  Her chest exam is benign this morning however I have ordered a chest x-ray and if it appears she has an infiltrate I will broaden her coverage.  I have ordered a bedside ultrasound of the renal system in order to ensure that we do not have a complex UTI with obstruction but I very much doubt this etiology.  A CSF sample has also been requested.  Suppressing temperatures as needed.   DVT prophylaxis - SCD Nutrition - tube feeds Goals of care - full code     Penny Pia, MD 08/22/2017  8:02 AM Pager: [phone] or [phone]

## 2017-08-22 NOTE — Progress Notes (Signed)
EVD clamped at 1243 by Dr. Conchita Paris. Will continue to monitor. Briant Cedar RN BSN.

## 2017-08-23 ENCOUNTER — Inpatient Hospital Stay (HOSPITAL_COMMUNITY): Payer: Medicaid Other

## 2017-08-23 DIAGNOSIS — I609 Nontraumatic subarachnoid hemorrhage, unspecified: Secondary | ICD-10-CM

## 2017-08-23 LAB — BASIC METABOLIC PANEL
Anion gap: 11 (ref 5–15)
BUN: 18 mg/dL (ref 6–20)
CALCIUM: 10.1 mg/dL (ref 8.9–10.3)
CO2: 23 mmol/L (ref 22–32)
CREATININE: 0.75 mg/dL (ref 0.44–1.00)
Chloride: 124 mmol/L — ABNORMAL HIGH (ref 101–111)
GFR calc Af Amer: >60 mL/min (ref >60)
GLUCOSE: 195 mg/dL — AB (ref 65–99)
Potassium: 3.6 mmol/L (ref 3.5–5.1)
Sodium: 158 mmol/L — ABNORMAL HIGH (ref 135–145)

## 2017-08-23 LAB — CBC WITH DIFFERENTIAL/PLATELET
Abs Immature Granulocytes: 0.2 10*3/uL — ABNORMAL HIGH (ref 0.0–0.1)
BASOS PCT: 1 %
Basophils Absolute: 0.1 10*3/uL (ref 0.0–0.1)
EOS ABS: 0 10*3/uL (ref 0.0–0.7)
Eosinophils Relative: 0 %
HEMATOCRIT: 33.6 % — AB (ref 36.0–46.0)
Hemoglobin: 10.7 g/dL — ABNORMAL LOW (ref 12.0–15.0)
IMMATURE GRANULOCYTES: 1 %
LYMPHS ABS: 3.8 10*3/uL (ref 0.7–4.0)
Lymphocytes Relative: 19 %
MCH: 29.2 pg (ref 26.0–34.0)
MCHC: 31.8 g/dL (ref 30.0–36.0)
MCV: 91.8 fL (ref 78.0–100.0)
MONOS PCT: 6 %
Monocytes Absolute: 1.2 10*3/uL — ABNORMAL HIGH (ref 0.1–1.0)
Neutro Abs: 14.4 10*3/uL — ABNORMAL HIGH (ref 1.7–7.7)
Neutrophils Relative %: 73 %
Platelets: 389 10*3/uL (ref 150–400)
RBC: 3.66 MIL/uL — ABNORMAL LOW (ref 3.87–5.11)
RDW: 14.8 % (ref 11.5–15.5)
WBC: 19.8 10*3/uL — ABNORMAL HIGH (ref 4.0–10.5)

## 2017-08-23 LAB — GLUCOSE, CAPILLARY
GLUCOSE-CAPILLARY: 183 mg/dL — AB (ref 65–99)
GLUCOSE-CAPILLARY: 193 mg/dL — AB (ref 65–99)
GLUCOSE-CAPILLARY: 220 mg/dL — AB (ref 65–99)
Glucose-Capillary: 171 mg/dL — ABNORMAL HIGH (ref 65–99)
Glucose-Capillary: 264 mg/dL — ABNORMAL HIGH (ref 65–99)
Glucose-Capillary: 152 mg/dL — ABNORMAL HIGH (ref 65–99)

## 2017-08-23 LAB — CULTURE, BLOOD (ROUTINE X 2)
Culture: NO GROWTH
Culture: NO GROWTH
Special Requests: ADEQUATE
Special Requests: ADEQUATE

## 2017-08-23 LAB — PATHOLOGIST SMEAR REVIEW

## 2017-08-23 LAB — PROCALCITONIN: Procalcitonin: 0.11 ng/mL

## 2017-08-23 NOTE — Progress Notes (Addendum)
  NEUROSURGERY PROGRESS NOTE   Pt seen and examined. No issues overnight.   EXAM: Temp:  [97.7 F (36.5 C)-102 F (38.9 C)] 97.7 F (36.5 C) (05/24 1145) Pulse Rate:  [83-110] 99 (05/24 1400) Resp:  [20-27] 23 (05/24 1400) BP: (111-152)/(67-95) 124/83 (05/24 1400) SpO2:  [95 %-100 %] 97 % (05/24 1400) Weight:  [63.7 kg (140 lb 6.9 oz)] 63.7 kg (140 lb 6.9 oz) (05/24 0500) Intake/Output      05/23 0701 - 05/24 0700 05/24 0701 - 05/25 0700   NG/GT 1250 350   IV Piggyback  200   Total Intake(mL/kg) 1250 (19.6) 550 (8.6)   Urine (mL/kg/hr) 1800 (1.2)    Drains 5    Stool 200    Total Output 2005    Net -755 +550        Urine Occurrence 1 x     Awake, alert Right facial droop Follows commands LUE/LLE No spontaneous movements RUE/RLE, slight w/d in RLE to pain. EVD in place, clamped  LABS: Lab Results  Component Value Date   CREATININE 0.75 08/23/2017   BUN 18 08/23/2017   NA 158 (H) 08/23/2017   K 3.6 08/23/2017   CL 124 (H) 08/23/2017   CO2 23 08/23/2017   Lab Results  Component Value Date   WBC 19.8 (H) 08/23/2017   HGB 10.7 (L) 08/23/2017   HCT 33.6 (L) 08/23/2017   MCV 91.8 08/23/2017   PLT 389 08/23/2017    IMPRESSION: - 62 y.o. 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 Stable neurologically with right hemiplegia. Appears to be stable after 24hrs with drain clamped, but did fail weaning trial last week. - Hypernatremia - Fever, likely due to UTI. CSF studies sent yesterday not c/w infection  PLAN: - Will keep drain clamped for another 24 hours, will plan on CT head tomorrow. If stable, may consider d/c EVD then. - Cont supportive care

## 2017-08-23 NOTE — Progress Notes (Signed)
PULMONARY / CRITICAL CARE MEDICINE   Name: Carla Bauer MRN: [id number] DOB: September 29, 1955    ADMISSION DATE:  08/14/2017 CONSULTATION DATE:  08/14/2017  REFERRING MD:  Carla Bauer - CNSA.   CHIEF COMPLAINT:  Aneurysmal subarachnoid hemorrhage.   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  Remained on vent post op.  SUBJECTIVE:   This is day 10 S/P SAH.  EVD was clamped yesterday. (5/23) she remains quite lethargic for me today and is not following any instructions during my visit.  VITAL SIGNS: BP 119/67   Pulse (!) 107   Temp 99.6 F (37.6 C)   Resp (!) 22   Ht  (1.549 m)   Wt 140 lb 6.9 oz (63.7 kg)   SpO2 98%   BMI 26.53 kg/m   INTAKE / OUTPUT: I/O last 3 completed shifts: In: 1950 [NG/GT:1950] Out: 3163 [Urine:2550; Drains:13; Stool:600]  PHYSICAL EXAMINATION: General: Lethargic and in no distress.        HEENT: Craniotomy site looks good.  There is no difficulty in maintaining her airway.  Neuro: No response to voice for me.  Partial eye opening and no phonation in response to sternal rub.  She moves her left extremities but not the right.  Pupils are equal at 3 mm.  above  CV: S1 and S2 are somewhat distant and regular without murmur rub or gallop.  PULM: Respirations are unlabored, there is symmetric air movement and good air movement throughout, there are no wheezes there are no rhonchi       GI: The abdomen is obese and soft without any organomegaly masses or tenderness   LABS:  BMET Recent Labs  Lab 08/21/17 0311 08/22/17 0321 08/23/17 0315  NA 151* 155* 158*  K 3.5 3.4* 3.6  CL 118* 124* 124*  CO2 23 20* 23  BUN CREATININE 0.74 0.78 0.75  GLUCOSE 196* 154* 195*    Electrolytes Recent Labs  Lab 08/18/17 0241  08/21/17 0311 08/22/17 0321 08/23/17 0315  CALCIUM 9.7   < > 9.8 9.9 10.1  MG 1.9  --    --   --   --    < > = values in this interval not displayed.    CBC Recent Labs  Lab 08/21/17 0311 08/22/17 0321 08/23/17 0315  WBC 15.1* 17.8* 19.8*  HGB 10.3* 10.4* 10.7*  HCT 32.5* 33.0* 33.6*  PLT 306 332 389    Coag's No results for input(s): APTT, INR in the last 168 hours.  Sepsis Markers Recent Labs  Lab 08/21/17 0311 08/22/17 0321 08/23/17 0315  PROCALCITON 0.19 0.15 0.11    ABG No results for input(s): PHART, PCO2ART, PO2ART in the last 168 hours.  Liver Enzymes No results for input(s): AST, ALT, ALKPHOS, BILITOT, ALBUMIN in the last 168 hours.  Cardiac Enzymes No results for input(s): TROPONINI, PROBNP in the last 168 hours.  Glucose Recent Labs  Lab 08/22/17 1543 08/22/17 2111 08/22/17 2354 08/23/17 0404 08/23/17 0841 08/23/17 1124  GLUCAP 179* 202* 193* 171* 183* 264*    Imaging US Renal  Result Date: 08/22/2017 CLINICAL DATA:  Urinary tract infection EXAM: RENAL / URINARY TRACT ULTRASOUND COMPLETE COMPARISON:  None FINDINGS: Right Kidney: Length: 10.5 cm. Normal cortical thickness andANISHA STARLIPERy. No mass, hydronephrosis or shadowing calcification. Left Kidney: Length: 11.7 cm. Normal cortical thickness and  echogenicity. No mass, hydronephrosis or shadowing calcification. Bladder: Well distended, normal appearance., without mass or wall thickening. IMPRESSION: Normal exam. Electronically Signed   By: Ulyses Southward M.D.   On: 08/22/2017 16:34     STUDIES:  CT Head 5/17 >> increasing hypoattenuation within the left medial temporal lobe extending to left posterior lentiform nucleus & posterior limb of internal capsule consistent with developing early subacute infarction.  Mild decrease in intraventricular hemorrhage, decreased mass effect at 7mm, decreased air CTA Head 5/17 >> no findings of vasospasm, no new large vessel occlusion, left posterior communicating artery aneurysm clip Echo 5/17 >> EF 65 to 70%, grade 1 DD, mild MR, mod TR, PAS 48  mmHg  CULTURES: Urine of 5/20 is growing greater than 10 to the fifth pansensitive E. coli ANTIBIOTICS: Rocephin started 5/21 SIGNIFICANT EVENTS: 5/15 Clipping PCOM aneurysm and clot evacuation.  2 units PRBC's intra-op  LINES/TUBES: EVD 5/15 >>  A-Line 5/15 >> out ETT 5/15 >> 5/17  DISCUSSION: This is a 62 year old with no known past medical history resented with a right hemiplegia.  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  She has undergone craniotomy and clipping of the aneurysm.  She continues to have an EVD in place which was clamped on 5/23.  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  Mental status remains tenuous but currently protecting airway.  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 R hemiplegia  PLAN -  Exam is not substantially different since her EVD was clamped on 5/23, frequent exams continue. Per neuro, nsgy  Continue nimodipine  EVD draining  F/u CT stable/improving  TCD's 5/23 were negative.  Her sodium continues to creep up I am going to hold her free water again today and will recheck her sodium in the morning.   Hypertension. PLAN -  Off cardene  TCD's yesterday were negative  PRN labetalol  Continue per tube metoprolol   Hypokalemia. Hypernatremia  PLAN -  KVO IVF  F/u chem in am  Replete K PRN   Hyperglycemia. PLAN -  SSI   Fever.  She is being treated for an E. coli UTI and I am disinclined to further investigate her fever as the fever is trending downward.  Ultrasound of the kidneys yesterday did not suggest a complex UTI.  Chest x-ray yesterday showed some very subtle atelectasis but certainly no overt infiltrate.  Spinal fluid obtained from the EVD yesterday was negative on Gram stain, we are still awaiting cultures.    DVT prophylaxis  - SCD Nutrition - tube feeds Goals of care - full code     Carla Pia, MD 08/23/2017  11:56 AM Pager: [phone] or [phone]

## 2017-08-23 NOTE — Progress Notes (Signed)
Occupational Therapy Treatment Patient Details Name: Carla Bauer MRN: [id number] DOB: [birth year] Today's Date: 08/23/2017    History of present illness pt is a 62 y/o female with no significant medical history admitted with R sided weakness.  Pt s/p L crani for aneurysm clipping, evacuation of left temporal hematoma and placement of right frontal ventricular drain.  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 Pt attempting hand over hand facial care with wash cloth this session max (A). Pt following simple command inconsistently with incr time. Pt responding to "Lakeysha" this session with name call.   Follow Up Recommendations  CIR;Supervision/Assistance - 24 hour    Equipment Recommendations  None recommended by OT    Recommendations for Other Services Rehab consult    Precautions / Restrictions Precautions Precautions: Fall Precaution Comments: watch for EVD clamped  Restrictions Weight Bearing Restrictions: No       Mobility Bed Mobility Overal bed mobility: Needs Assistance Bed Mobility: Sit to Supine       Sit to supine: Total assist;+2 for physical assistance   General bed mobility comments: pt without any discernible assist returning to supine.  Transfers Overall transfer level: Needs assistance Equipment used: 2 person hand held assist Transfers: Squat Pivot Transfers     Squat pivot transfers: Max assist;+2 physical assistance     General transfer comment: If transferred slow enough pt's L LE kicks in to support some of her weight.  pt does not reach out with L UE to help. pt remains very stolic and dependent on therapist to guide the transfer    Balance Overall balance assessment: Needs assistance Sitting-balance support: Single extremity supported;Feet  supported Sitting balance-Leahy Scale: Poor Sitting balance - Comments: worked on balance at EOB, Left leg blocked, L Ue supported to negate assist.  Unccoordinated truncal control, but at times able to stay close to midline for seconds, then invariably lists forward and right until she loses her balance and unable to recover.     Standing balance-Leahy Scale: Poor                             ADL either performed or assessed with clinical judgement   ADL Overall ADL's : Needs assistance/impaired Eating/Feeding: NPO   Grooming: Wash/dry face;Maximal assistance Grooming Details (indicate cue type and reason): hand over hand (A). pt with dried secretions on mouth currently. pt required total (A) from OT with warm wash cloth and swab with biotene to remove Upper Body Bathing: Total assistance   Lower Body Bathing: Total assistance   Upper Body Dressing : Total assistance   Lower Body Dressing: Total assistance   Toilet Transfer: Maximal assistance;+2 for physical assistance;BSC Toilet Transfer Details (indicate cue type and reason): simulated chair to bed                 Vision       Perception     Praxis      Cognition Arousal/Alertness: Awake/alert Behavior During Therapy: Flat affect;WFL for tasks assessed/performed Overall Cognitive Status: Impaired/Different from baseline Area of Impairment: Orientation;Attention;Memory;Following commands;Awareness;Safety/judgement;Problem solving                 Orientation Level: Place;Time;Situation Current Attention Level: Focused   Following Commands: Follows one step commands inconsistently;Follows  one step commands with increased time Safety/Judgement: Decreased awareness of safety;Decreased awareness of deficits Awareness: Intellectual Problem Solving: Slow processing;Requires verbal cues;Requires tactile cues;Difficulty sequencing;Decreased initiation General Comments: Pt with signficant communication  deficits.  She will follow some one step motor commands infrequently with multi modal cues.         Exercises     Shoulder Instructions       General Comments VSS and L gaze preference without tracking.     Pertinent Vitals/ Pain       Pain Assessment: No/denies pain Faces Pain Scale: No hurt  Home Living                                          Prior Functioning/Environment              Frequency           Progress Toward Goals  OT Goals(current goals can now be found in the care plan section)  Progress towards OT goals: Progressing toward goals  Acute Rehab OT Goals Patient Stated Goal: pt unable to state.  pt's children interested in rehab. OT Goal Formulation: With family Time For Goal Achievement: 09/02/17 Potential to Achieve Goals: Good ADL Goals Pt Will Perform Grooming: with mod assist;sitting Pt Will Perform Upper Body Bathing: with mod assist;sitting Pt Will Transfer to Toilet: with mod assist;squat pivot transfer;bedside commode Additional ADL Goal #1: Pt will locate items at midline with min cues during ADL activiites Additional ADL Goal #2: Family will be independent with PROM and positioning of Rt UE  Plan Discharge plan remains appropriate    Co-evaluation    PT/OT/SLP Co-Evaluation/Treatment: Yes Reason for Co-Treatment: Complexity of the patient's impairments (multi-system involvement);Necessary to address cognition/behavior during functional activity;For patient/therapist safety   OT goals addressed during session: ADL's and self-care;Proper use of Adaptive equipment and DME;Strengthening/ROM      AM-PAC PT "6 Clicks" Daily Activity     Outcome Measure   Help from another person eating meals?: Total Help from another person taking care of personal grooming?: A Lot Help from another person toileting, which includes using toliet, bedpan, or urinal?: Total Help from another person bathing (including washing, rinsing,  drying)?: Total Help from another person to put on and taking off regular upper body clothing?: Total Help from another person to put on and taking off regular lower body clothing?: Total 6 Click Score: 7    End of Session Equipment Utilized During Treatment: Other (comment)(EVD in place)  OT Visit Diagnosis: Unsteadiness on feet (R26.81);Cognitive communication deficit (R41.841);Hemiplegia and hemiparesis Symptoms and signs involving cognitive functions: Cerebral infarction;Nontraumatic SAH Hemiplegia - Right/Left: Right Hemiplegia - dominant/non-dominant: Dominant Hemiplegia - 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    Boone Master B 08/23/2017, 11:16 AM

## 2017-08-23 NOTE — Progress Notes (Signed)
Inpatient Rehabilitation Admissions Coordinator  I will follow up Monday with pt's progress. [name], daughter, to contact me when she and her sister and patient's husband can meet to discuss her disposition options.  Ottie Glazier, RN, MSN Rehab Admissions Coordinator [phone] 08/23/2017 2:42 PM

## 2017-08-23 NOTE — Progress Notes (Signed)
Transcranial Doppler  Date POD PCO2 HCT BP  MCA ACA PCA OPHT SIPH VERT Basilar  5/17 RS     Right  Left   72  49   -59     34  42  32              5/20 GC     Right  Left   59  49   -23     - 5/22,rs     Right  Left   70  67   -64  -73   30  41   17  22   47  34   -21  *   *      5/24,rs      Right  Left   71  30   -51  *   46  30   *  23   *            Right  Left                                            Right  Left                                            Right  Left                                        MCA = Middle Cerebral Artery      OPHT = Opthalmic Artery     BASILAR = Basilar Artery   ACA = Anterior Cerebral Artery     SIPH = Carotid Siphon PCA = Posterior Cerebral Artery   VERT = Verterbral Artery                   Normal MCA = 62+\-12 ACA = 50+\-12 PCA = 42+\-23   08/21/17 - (*) not insonated.  08/23/17 Poor occipital window, (*) not insonated. rs

## 2017-08-23 NOTE — Progress Notes (Signed)
Physical Therapy Treatment Patient Details Name: Carla Bauer MRN: [id number] DOB: 1955-09-01 Today's Date: 08/23/2017    History of Present Illness pt is a 62 y/o female with no significant medical history admitted with R sided weakness.  Pt s/p L crani for aneurysm clipping, evacuation of left temporal hematoma and placement of right frontal ventricular drain.  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  Emphasis on sitting balance and drawing attention to the right.    Follow Up Recommendations  CIR     Equipment Recommendations  Other (comment)    Recommendations for Other Services       Precautions / Restrictions Precautions Precautions: Fall Restrictions Weight Bearing Restrictions: No    Mobility  Bed Mobility Overal bed mobility: Needs Assistance Bed Mobility: Sit to Supine       Sit to supine: Total assist;+2 for physical assistance   General bed mobility comments: pt without any discernible assist returning to supine.  Transfers Overall transfer level: Needs assistance Equipment used: 2 person hand held assist Transfers: Squat Pivot Transfers     Squat pivot transfers: Max assist;+2 physical assistance     General transfer comment: If transferred slow enough pt's L LE kicks in to support some of her weight.  pt does not reach out with L UE to help  Ambulation/Gait             General Gait Details: NT   Stairs             Wheelchair Mobility    Modified Rankin (Stroke Patients Only) Modified Rankin (Stroke Patients Only) Pre-Morbid Rankin Score: No symptoms Modified Rankin: Severe disability     Balance Overall balance assessment: Needs assistance Sitting-balance support: Single extremity supported;Feet supported Sitting balance-Leahy Scale:  Poor Sitting balance - Comments: worked on balance at EOB, Left leg blocked, L Ue supported to negate assist.  Unccoordinated truncal control, but at times able to stay close to midline for seconds, then invariably lists forward and right until she loses her balance and unable to recover.     Standing balance-Leahy Scale: Poor                              Cognition Arousal/Alertness: Awake/alert Behavior During Therapy: Flat affect;WFL for tasks assessed/performed Overall Cognitive Status: Impaired/Different from baseline Area of Impairment: Orientation;Attention;Memory;Following commands;Awareness;Safety/judgement;Problem solving                 Orientation Level: Place;Time;Situation Current Attention Level: Focused   Following Commands: Follows one step commands inconsistently;Follows one step commands with increased time Safety/Judgement: Decreased awareness of safety;Decreased awareness of deficits Awareness: Intellectual Problem Solving: Slow processing;Requires verbal cues;Requires tactile cues;Difficulty sequencing;Decreased initiation General Comments: Pt with signficant communication deficits.  She will follow some one step motor commands infrequently with multi modal cues.       Exercises      General Comments General comments (skin integrity, edema, etc.): vss.   pt unable to track OR hold attension visibly even in the left field.      Pertinent Vitals/Pain Faces Pain Scale: No hurt    Home Living                      Prior Function  PT Goals (current goals can now be found in the care plan section) Acute Rehab PT Goals Patient Stated Goal: pt unable to state.  pt's children interested in rehab. PT Goal Formulation: With patient/family Time For Goal Achievement: 09/02/17 Potential to Achieve Goals: Fair Progress towards PT goals: Progressing toward goals    Frequency    Min 3X/week      PT Plan Current plan  remains appropriate    Co-evaluation              AM-PAC PT "6 Clicks" Daily Activity  Outcome Measure  Difficulty turning over in bed (including adjusting bedclothes, sheets and blankets)?: Unable Difficulty moving from lying on back to sitting on the side of the bed? : Unable Difficulty sitting down on and standing up from a chair with arms (e.g., wheelchair, bedside commode, etc,.)?: Unable Help needed moving to and from a bed to chair (including a wheelchair)?: Total Help needed walking in hospital room?: Total Help needed climbing 3-5 steps with a railing? : Total 6 Click Score: 6    End of Session   Activity Tolerance: Patient limited by fatigue Patient left: in bed;with call bell/phone within reach;with bed alarm set;with SCD's reapplied Nurse Communication: Mobility status PT Visit Diagnosis: Hemiplegia and hemiparesis Hemiplegia - Right/Left: Right Hemiplegia - dominant/non-dominant: Dominant Hemiplegia - caused by: Cerebral infarction     Time: 1015-1047 PT Time Calculation (min) (ACUTE ONLY): 32 min  Charges:  $Therapeutic Activity: 8-22 mins                    G Codes:       2017-09-04  [place] Bing, PT [phone]  (pager)   Carla Bauer 2017/09/04, 11:07 AM

## 2017-08-23 NOTE — Progress Notes (Signed)
Nutrition Follow-up   INTERVENTION:   Continue:  Osmolite 1.2 @ 50 ml/hr (1200 ml/day) 30 ml Prostat daily  Provides: 1540 kcal, 81 grams protein, and 973 ml free water.    NUTRITION DIAGNOSIS:   Inadequate oral intake related to inability to eat as evidenced by NPO status. Ongoing  GOAL:   Patient will meet greater than or equal to 90% of their needs Met.   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    Pt discussed during ICU rounds and with RN.  EVD now clamped if not tolerated will need shunt  5/17 extubated, cortrak placed, tip is in the stomach  5/23 EVD clamped  Medications reviewed and include Labs reviewed: Na 158 (H)   NUTRITION - FOCUSED PHYSICAL EXAM:    Most Recent Value  Orbital Region  No depletion  Upper Arm Region  No depletion  Thoracic and Lumbar Region  No depletion  Buccal Region  Unable to assess  Temple Region  No depletion  Clavicle Bone Region  No depletion  Clavicle and Acromion Bone Region  No depletion  Scapular Bone Region  Unable to assess  Dorsal Hand  No depletion  Patellar Region  No depletion  Anterior Thigh Region  No depletion  Posterior Calf Region  No depletion  Edema (RD Assessment)  None  Hair  Reviewed  Eyes  Unable to assess  Mouth  Unable to assess  Skin  Reviewed  Nails  Reviewed       Diet Order:   Diet Order           Diet NPO time specified  Diet effective now          EDUCATION NEEDS:   No education needs have been identified at this time  Skin:  Skin Assessment: Reviewed RN Assessment  Last BM:  200 ml x 24 via rectal tube  Height:   Ht Readings from Last 1 Encounters:  08/14/17 5' 1"  (1.549 m)    Weight:   Wt Readings from Last 1 Encounters:  08/23/17 140 lb 6.9 oz (63.7 kg)    Ideal Body Weight:  47.7 kg  BMI:  Body mass index is 26.53  kg/m.  Estimated Nutritional Needs:   Kcal:  1500-1700  Protein:  80-90 grams  Fluid:  > 1.5 L/day  Maylon Peppers RD, LDN, CNSC [phone] Pager [phone] After Hours Pager

## 2017-08-24 LAB — GLUCOSE, CAPILLARY
GLUCOSE-CAPILLARY: 219 mg/dL — AB (ref 65–99)
GLUCOSE-CAPILLARY: 224 mg/dL — AB (ref 65–99)
GLUCOSE-CAPILLARY: 262 mg/dL — AB (ref 65–99)
Glucose-Capillary: 162 mg/dL — ABNORMAL HIGH (ref 65–99)
Glucose-Capillary: 246 mg/dL — ABNORMAL HIGH (ref 65–99)
Glucose-Capillary: 221 mg/dL — ABNORMAL HIGH (ref 65–99)
Glucose-Capillary: 225 mg/dL — ABNORMAL HIGH (ref 65–99)

## 2017-08-24 LAB — BASIC METABOLIC PANEL
BUN: 20 mg/dL (ref 6–20)
CO2: 22 mmol/L (ref 22–32)
CREATININE: 0.8 mg/dL (ref 0.44–1.00)
Calcium: 10 mg/dL (ref 8.9–10.3)
GFR calc Af Amer: >60 mL/min (ref >60)
GFR calc non Af Amer: >60 mL/min (ref >60)
GLUCOSE: 194 mg/dL — AB (ref 65–99)
POTASSIUM: 3.5 mmol/L (ref 3.5–5.1)
Sodium: 161 mmol/L (ref 135–145)

## 2017-08-24 LAB — PROCALCITONIN: Procalcitonin: <0.1 ng/mL

## 2017-08-24 MED ORDER — PSYLLIUM 95 % PO PACK
1.0000 | PACK | Freq: Two times a day (BID) | ORAL | Status: DC
  Administered 2017-08-25 – 2017-09-03 (×17): 1 via ORAL
  Filled 2017-08-24 (×23): qty 1

## 2017-08-24 MED ORDER — FREE WATER
400.0000 mL | Freq: Four times a day (QID) | Status: DC
  Administered 2017-08-24 – 2017-09-03 (×35): 400 mL

## 2017-08-24 MED ORDER — FREE WATER
200.0000 mL | Freq: Four times a day (QID) | Status: DC
  Administered 2017-08-24 (×3): 200 mL

## 2017-08-24 MED ORDER — INSULIN GLARGINE 100 UNIT/ML ~~LOC~~ SOLN
20.0000 [IU] | Freq: Every day | SUBCUTANEOUS | Status: DC
  Administered 2017-08-24 – 2017-09-02 (×10): 20 [IU] via SUBCUTANEOUS
  Filled 2017-08-24 (×11): qty 0.2

## 2017-08-24 NOTE — Progress Notes (Signed)
CRITICAL VALUE ALERT  Critical Value:  NA 161 & CL >130  Date & Time Notied:  4/25 0450  Provider Notified: Pola Corn  Orders Received/Actions taken:

## 2017-08-24 NOTE — Progress Notes (Addendum)
PULMONARY / CRITICAL CARE MEDICINE   Name: ALEIGHNA WOJTAS MRN: [id number] DOB: Aug 07, 1955    ADMISSION DATE:  08/14/2017 CONSULTATION DATE:  08/14/2017  REFERRING MD:  Patric Dykes - CNSA.   CHIEF COMPLAINT:  Aneurysmal subarachnoid hemorrhage.   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  Remained on vent post op.  SUBJECTIVE:   This is day 10 S/P SAH.  EVD was clamped yesterday. (5/23) she remains quite lethargic for me today and is not following any instructions during my visit.  VITAL SIGNS: BP (!) 124/91   Pulse 96   Temp 97.6 F (36.4 C) (Axillary)   Resp (!) 21   Ht  (1.549 m)   Wt 140 lb 6.9 oz (63.7 kg)   SpO2 97%   BMI 26.53 kg/m   INTAKE / OUTPUT: I/O last 3 completed shifts: In: 2120 [Other:120; NG/GT:1800; IV Piggyback:200] Out: 1275 [Urine:1075; Stool:200]  PHYSICAL EXAMINATION: General: Lethargic and in no distress.        HEENT: Craniotomy site looks good.  There is no difficulty in maintaining her airway.   Neuro: Awake and following commands on the L side. R side remains plegic with evolving contractures. R hemineglect.  CV: S1 and S2 are somewhat distant and regular without murmur rub or gallop.  PULM: Respirations are unlabored, there is symmetric air movement and good air movement throughout, there are no wheezes there are no rhonchi       GI: The abdomen is obese and soft without any organomegaly masses or tenderness. Copious diarrhea.  LABS:  BMET Recent Labs  Lab 08/22/17 0321 08/23/17 0315 08/24/17 0335  NA 155* 158* 161*  K 3.4* 3.6 3.5  CL 124* 124* >130*  CO2 20* 23 22  BUN CREATININE 0.78 0.75 0.80  GLUCOSE 154* 195* 194*    Electrolytes Recent Labs  Lab 08/18/17 0241  08/22/17 0321 08/23/17 0315 08/24/17 0335  CALCIUM 9.7   < > 9.9 10.1 10.0  MG 1.9  --   --   --   --    <  > = values in this interval not displayed.    CBC Recent Labs  Lab 08/21/17 0311 08/22/17 0321 08/23/17 0315  WBC 15.1* 17.8* 19.8*  HGB 10.3* 10.4* 10.7*  HCT 32.5* 33.0* 33.6*  PLT 306 332 389    Coag's No results for input(s): APTT, INR in the last 168 hours.  Sepsis Markers Recent Labs  Lab 08/22/17 0321 08/23/17 0315 08/24/17 0335  PROCALCITON 0.15 0.11 <0.10    ABG No results for input(s): PHART, PCO2ART, PO2ART in the last 168 hours.  Liver Enzymes No results for input(s): AST, ALT, ALKPHOS, BILITOT, ALBUMIN in the last 168 hours.  Cardiac Enzymes No results for input(s): TROPONINI, PROBNP in the last 168 hours.  Glucose Recent Labs  Lab 08/23/17 2027 08/23/17 2343 08/24/17 0409 08/24/17 0842 08/24/17 1202 08/24/17 1608  GLUCAP 193* 220* 162* 219* 246* 221*    Imaging No results found.   STUDIES:  CT Head 5/17 >> increasing hypoattenuation within the left medial temporal lobe extending to left posterior lentiform nucleus & posterior limb of internal capsule consistent with developing early subacute infarction.  Mild decrease in intraventricular hemorrhage, decreased mass effect at 7mm, decreased air CTA Head 5/17 >> no findings of vasospasm, no new large vessel occlusion,  left posterior communicating artery aneurysm clip Echo 5/17 >> EF 65 to 70%, grade 1 DD, mild MR, mod TR, PAS 48 mmHg  CULTURES: Urine of 5/20 is growing greater than 10 to the fifth pansensitive E. coli ANTIBIOTICS: Rocephin started 5/21 SIGNIFICANT EVENTS: 5/15 Clipping PCOM aneurysm and clot evacuation.  2 units PRBC's intra-op  LINES/TUBES: EVD 5/15 >>  A-Line 5/15 >> out ETT 5/15 >> 5/17  DISCUSSION: This is a 62 year old with no known past medical history resented with a right hemiplegia.  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  She has undergone craniotomy and clipping of the aneurysm.  She continues to have an EVD in place which was clamped on  5/23.  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  Mental status remains tenuous but currently protecting airway.  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 R hemiplegia  PLAN -  Exam is not substantially different since her EVD was clamped on 5/23, frequent exams continue. Per neuro, nsgy  Continue nimodipine  EVD draining  F/u CT stable/improving  TCD's 5/23 were negative.  Hypernatremia - increase free water.   Hypertension. PLAN -  Off cardene  TCD's yesterday were negative  PRN labetalol  Continue per tube metoprolol   Hypokalemia. Hypernatremia  PLAN -  KVO IVF  F/u chem in am  Replete K PRN   Hyperglycemia. PLAN - increase basal bolus insulin. SSI   Fever.  She is being treated for an E. coli UTI and I am disinclined to further investigate her fever as the fever is trending downward.  Ultrasound of the kidneys did not suggest a complex UTI.  Chest x-ray yesterday showed some very subtle atelectasis but certainly no overt infiltrate.  Spinal fluid obtained from the EVD yesterday was negative on Gram stain, we are still awaiting cultures.    DVT prophylaxis - SCD Nutrition - tube feeds Goals of care - full code   Lynnell Catalan, MD 08/24/2017  5:33 PM Pager: [phone] or [phone]

## 2017-08-24 NOTE — Progress Notes (Signed)
eLink Physician-Brief Progress Note Patient Name: GRACILYN GUNIA DOB: [birth year] MRN: [id number]   Date of Service  08/24/2017  HPI/Events of Note  Sodium 161  eICU Interventions  Will gently give free H2O given ICH.  Prefer patient correct slowly and stay on hypernatremic side     Intervention Category Minor Interventions: Electrolytes abnormality - evaluation and management  Henry Russel, P 08/24/2017, 5:26 AM

## 2017-08-24 NOTE — Progress Notes (Addendum)
Overall stable.  No new issues or problems overnight.  Afebrile.  Vital signs are stable.  Drain remains clamped.  Patient is awake and aware.  She is nonverbal.  She will follow some simple commands on the left side.  Still remains densely hemiparetic on the right.  Wound clean and dry.  Overall stable.  Plan follow-up head remove drain.  CT scan tomorrow.  If scans stable then will remove drain.

## 2017-08-25 ENCOUNTER — Inpatient Hospital Stay (HOSPITAL_COMMUNITY): Payer: Medicaid Other

## 2017-08-25 LAB — CSF CULTURE W GRAM STAIN
Culture: NO GROWTH
Special Requests: NORMAL

## 2017-08-25 LAB — BASIC METABOLIC PANEL
Anion gap: 10 (ref 5–15)
BUN: 19 mg/dL (ref 6–20)
CHLORIDE: 129 mmol/L — AB (ref 101–111)
CO2: 21 mmol/L — AB (ref 22–32)
CREATININE: 0.71 mg/dL (ref 0.44–1.00)
Calcium: 10.1 mg/dL (ref 8.9–10.3)
GFR calc non Af Amer: >60 mL/min (ref >60)
Glucose, Bld: 126 mg/dL — ABNORMAL HIGH (ref 65–99)
Potassium: 3.7 mmol/L (ref 3.5–5.1)
Sodium: 160 mmol/L — ABNORMAL HIGH (ref 135–145)

## 2017-08-25 LAB — GLUCOSE, CAPILLARY
GLUCOSE-CAPILLARY: 122 mg/dL — AB (ref 65–99)
GLUCOSE-CAPILLARY: 109 mg/dL — AB (ref 65–99)
Glucose-Capillary: 135 mg/dL — ABNORMAL HIGH (ref 65–99)
Glucose-Capillary: 180 mg/dL — ABNORMAL HIGH (ref 65–99)
Glucose-Capillary: 161 mg/dL — ABNORMAL HIGH (ref 65–99)

## 2017-08-25 MED ORDER — VITAL HIGH PROTEIN PO LIQD
1000.0000 mL | ORAL | Status: DC
  Administered 2017-08-25 – 2017-08-26 (×2): 1000 mL

## 2017-08-25 MED ORDER — CEFAZOLIN SODIUM-DEXTROSE 1-4 GM/50ML-% IV SOLN
1.0000 g | Freq: Three times a day (TID) | INTRAVENOUS | Status: DC
  Administered 2017-08-25 – 2017-08-31 (×18): 1 g via INTRAVENOUS
  Filled 2017-08-25 (×22): qty 50

## 2017-08-25 NOTE — Progress Notes (Signed)
Subjective: The patient is alert.  She is in no apparent distress.  Objective: Vital signs in last 24 hours: Temp:  [97.6 F (36.4 C)-98.9 F (37.2 C)] 97.7 F (36.5 C) (05/26 0800) Pulse Rate:  [81-107] 95 (05/26 0900) Resp:  [15-23] 19 (05/26 0900) BP: (111-159)/(63-103) 154/89 (05/26 0900) SpO2:  [97 %-100 %] 100 % (05/26 0900) Weight:  [62.9 kg (138 lb 10.7 oz)] 62.9 kg (138 lb 10.7 oz) (05/26 0406) Estimated body mass index is 26.2 kg/m as calculated from the following:   Height as of this encounter:  (1.549 m).   Weight as of this encounter: 62.9 kg (138 lb 10.7 oz).   Intake/Output from previous day: 05/25 0701 - 05/26 0700 In: 1300 [NG/GT:1200; IV Piggyback:100] Out: 1800 [Urine:1250; Stool:550] Intake/Output this shift: No intake/output data recorded.  Physical exam the patient is Glasgow Coma Scale 13, E 4 M 6 V3, she follows commands on the left.  Her pupils are equal.  There is drainage at the ventriculostomy drain exit site.  I have reviewed the patient's follow-up head CT performed today.  Her left intracerebral hemorrhage is resolving.  Her left subdural hematoma is smaller.  Her ventricles are a bit larger.  Lab Results: Recent Labs    08/23/17 0315  WBC 19.8*  HGB 10.7*  HCT 33.6*  PLT 389   BMET Recent Labs    08/24/17 0335 08/25/17 0247  NA 161* 160*  K 3.5 3.7  CL >130* 129*  CO2 22 21*  GLUCOSE 194* 126*  BUN 20 19  CREATININE 0.80 0.71  CALCIUM 10.0 10.1    Studies/Results: Ct Head Wo Contrast  Result Date: 08/25/2017 CLINICAL DATA:  Subarachnoid hemorrhage.  Status post craniotomy. EXAM: CT HEAD WITHOUT CONTRAST TECHNIQUE: Contiguous axial images were obtained from the base of the skull through the vertex without intravenous contrast. COMPARISON:  08/19/2017 FINDINGS: Brain: Decreased size of extra-axial collection underlying the left pterional craniotomy site with linear hyperdensity that may indicate dural graft material. Small  amount of subdural blood along the left tentorial leaflet is unchanged. Hypoattenuation along the posterior limb of the left internal capsule is unchanged. There are aging Blood products in the left lateral ventricle. The ventricles are slightly larger than on the prior study but there is no hydrocephalus. There is no acute hemorrhage. There is a right frontal approach ventriculostomy catheter with tip in the right lateral ventricle frontal horn. Rightward midline shift measures 4 mm, previously 5 mm. Vascular: Left P-comm aneurysm clip. No abnormal hyperdensity of the dural venous sinuses or major intracranial arteries. Skull: Recent left pterional craniotomy and right frontal burr hole. Skin staples overlie the surgical sites. Sinuses/Orbits: No fluid levels or advanced mucosal thickening of the visualized paranasal sinuses. No mastoid or middle ear effusion. The orbits are normal. IMPRESSION: Continued decrease in size of left convexity subdural hematoma. Improving rightward midline shift. No hydrocephalus or new site of hemorrhage.Expected evolution of left internal capsule posterior limb infarct. Electronically Signed   By: Deatra Robinson M.D.   On: 08/25/2017 05:26    Assessment/Plan: Intracerebral hemorrhage, hydrocephalus: We will reopen the ventricular drain and start Ancef.  LOS: 11 days     [name] 08/25/2017, 9:49 AM

## 2017-08-26 ENCOUNTER — Inpatient Hospital Stay (HOSPITAL_COMMUNITY): Payer: Medicaid Other

## 2017-08-26 DIAGNOSIS — I609 Nontraumatic subarachnoid hemorrhage, unspecified: Secondary | ICD-10-CM

## 2017-08-26 LAB — BASIC METABOLIC PANEL
Anion gap: 9 (ref 5–15)
Anion gap: 7 (ref 5–15)
BUN: 19 mg/dL (ref 6–20)
BUN: 18 mg/dL (ref 6–20)
CHLORIDE: 117 mmol/L — AB (ref 101–111)
CO2: 21 mmol/L — ABNORMAL LOW (ref 22–32)
CO2: 23 mmol/L (ref 22–32)
CREATININE: 0.63 mg/dL (ref 0.44–1.00)
Calcium: 9.8 mg/dL (ref 8.9–10.3)
Calcium: 9.5 mg/dL (ref 8.9–10.3)
Chloride: 123 mmol/L — ABNORMAL HIGH (ref 101–111)
Creatinine, Ser: 0.59 mg/dL (ref 0.44–1.00)
GFR calc Af Amer: >60 mL/min (ref >60)
GFR calc non Af Amer: >60 mL/min (ref >60)
GFR calc non Af Amer: >60 mL/min (ref >60)
GLUCOSE: 143 mg/dL — AB (ref 65–99)
GLUCOSE: 193 mg/dL — AB (ref 65–99)
POTASSIUM: 3.5 mmol/L (ref 3.5–5.1)
Potassium: 3.1 mmol/L — ABNORMAL LOW (ref 3.5–5.1)
SODIUM: 153 mmol/L — AB (ref 135–145)
Sodium: 147 mmol/L — ABNORMAL HIGH (ref 135–145)

## 2017-08-26 LAB — GLUCOSE, CAPILLARY
GLUCOSE-CAPILLARY: 126 mg/dL — AB (ref 65–99)
GLUCOSE-CAPILLARY: 159 mg/dL — AB (ref 65–99)
GLUCOSE-CAPILLARY: 165 mg/dL — AB (ref 65–99)
GLUCOSE-CAPILLARY: 195 mg/dL — AB (ref 65–99)
Glucose-Capillary: 105 mg/dL — ABNORMAL HIGH (ref 65–99)
Glucose-Capillary: 140 mg/dL — ABNORMAL HIGH (ref 65–99)
Glucose-Capillary: 212 mg/dL — ABNORMAL HIGH (ref 65–99)

## 2017-08-26 MED ORDER — CHLORHEXIDINE GLUCONATE 0.12 % MT SOLN
OROMUCOSAL | Status: AC
  Filled 2017-08-26: qty 15

## 2017-08-26 MED ORDER — POTASSIUM CHLORIDE 20 MEQ/15ML (10%) PO SOLN
30.0000 meq | ORAL | Status: AC
  Administered 2017-08-26 (×2): 30 meq
  Filled 2017-08-26 (×2): qty 30

## 2017-08-26 NOTE — Progress Notes (Signed)
Transcranial Doppler  Date POD PCO2 HCT BP  MCA ACA PCA OPHT SIPH VERT Basilar  5/17 RS     Right  Left   72  49   -59     34  42  32              5/20 GC     Right  Left   59  49   -23     - 5/22,rs     Right  Left   70  67   -64  -73   30  41   17  22   47  34   -21  *   *      5/24,rs      Right  Left   71  30   -51  *   46  30   *  23   *      5/27,rs      Right  Left   60  63   -42  -55   47  33-   *  -19   *           Right  Left                                            Right  Left                                        MCA = Middle Cerebral Artery      OPHT = Opthalmic Artery     BASILAR = Basilar Artery   ACA = Anterior Cerebral Artery     SIPH = Carotid Siphon PCA = Posterior Cerebral Artery   VERT = Verterbral Artery                   Normal MCA = 62+\-12 ACA = 50+\-12 PCA = 42+\-23   08/21/17 - (*) not insonated.  08/23/17 Poor occipital window, (*) not insonated. rs 08/26/17 - (*) not insonated,rs

## 2017-08-26 NOTE — Progress Notes (Signed)
  Speech Language Pathology Treatment: Dysphagia;Cognitive-Linquistic  Patient Details Name: Carla Bauer MRN: [id number] DOB: 07-07-1955 Today's Date: 08/26/2017 Time: 4034-7425 SLP Time Calculation (min) (ACUTE ONLY): 16 min  Assessment / Plan / Recommendation Clinical Impression  Pt demonstrates dramatic improvement in cognition since last session. Pt is alert, follows commands, attends to environment. She verbalizes with adequate phonation though verbal cues sometimes needed if volume is low. Pts responses and comments are mostly consistent with delirium/language of confusion, but with context are sometimes appropriate. After a sip of water, "Girl, I been waiting on this!" and then when asked her last name "[id number]-L-I-G-H"  Pt shows excellent potential to start a diet. She consumed 2 oz of applesauce without difficulty despite mild CN VII weakness on right. Consecutive straw sips followed by coughing, but single sips tolerated well. Unsure if pts function will be consistent. Will f/u tomorrow for further trials for potential diet initiation or FEES if needed. RN may give ice if pt alert.   HPI HPI: Carla Bauer is a 62 y.o. female with no significant history found having fallen out of the bed with R arm weakness and altered mental status. 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 Intubated ETT 5/15- 5/17.      SLP Plan  Continue with current plan of care       Recommendations  Diet recommendations: NPO                General recommendations: Rehab consult Oral Care Recommendations: Oral care BID;Oral care QID Follow up Recommendations: Inpatient Rehab SLP Visit Diagnosis: Attention and concentration deficit Plan: Continue with current plan of care       GO               [facility], MA CCC-SLP [phone]  Claudine Mouton 08/26/2017, 11:32 AM

## 2017-08-26 NOTE — Progress Notes (Signed)
Inpatient Rehabilitation Admissions Coordinator  I continue to follow pt's progress.  Ottie Glazier, RN, MSN Rehab Admissions Coordinator [phone] 08/26/2017 11:21 AM

## 2017-08-26 NOTE — Progress Notes (Signed)
[facility] LAB REPLACEMENT ONLY  The patient does apply for the [facility] Electrolyte Replacment Protocol based on the criteria listed below:   1. Is GFR >/= 40 ml/min? Yes.    [name] GFR today is 19 2. Is urine output >/= 0.5 ml/kg/hr for the last 6 hours? Yes.   [name] UOP is 0.8 ml/kg/hr 3. Is BUN < 60 mg/dL? Yes.    [name] BUN today is >60 4. Abnormal electrolyte(s):k 3.1 5. Ordered repletion with: protocol 6. If a panic level lab has been reported, has the CCM MD in charge been notified? No..   Physician:    Markus Daft A 08/26/2017 5:24 AM

## 2017-08-26 NOTE — Progress Notes (Signed)
Subjective: The patient is somnolent but arousable.  Her daughter is at the bedside.  Objective: Vital signs in last 24 hours: Temp:  [97.7 F (36.5 C)-98.8 F (37.1 C)] 98.2 F (36.8 C) (05/27 0400) Pulse Rate:  [76-105] 90 (05/27 0600) Resp:  [15-24] 18 (05/27 0600) BP: (102-154)/(68-103) 109/70 (05/27 0600) SpO2:  [98 %-100 %] 98 % (05/27 0600) Weight:  [67.1 kg (147 lb 14.9 oz)] 67.1 kg (147 lb 14.9 oz) (05/27 0500) Estimated body mass index is 27.95 kg/m as calculated from the following:   Height as of this encounter:  (1.549 m).   Weight as of this encounter: 67.1 kg (147 lb 14.9 oz).   Intake/Output from previous day: 05/26 0701 - 05/27 0700 In: 2100 [NG/GT:1950; IV Piggyback:150] Out: 1499 [Urine:1125; Drains:24; Stool:350] Intake/Output this shift: Total I/O In: 650 [NG/GT:550; IV Piggyback:100] Out: 720 [Urine:700; Drains:20]  Physical exam the patient is somnolent but arousable.  She localizes on the left.  Her ventriculostomy is patent.  Lab Results: No results for input(s): WBC, HGB, HCT, PLT in the last 72 hours. BMET Recent Labs    08/25/17 0247 08/26/17 0303  NA 160* 153*  K 3.7 3.1*  CL 129* 123*  CO2 21* 21*  GLUCOSE 126* 143*  BUN 19 19  CREATININE 0.71 0.63  CALCIUM 10.1 9.8    Studies/Results: Ct Head Wo Contrast  Result Date: 08/25/2017 CLINICAL DATA:  Subarachnoid hemorrhage.  Status post craniotomy. EXAM: CT HEAD WITHOUT CONTRAST TECHNIQUE: Contiguous axial images were obtained from the base of the skull through the vertex without intravenous contrast. COMPARISON:  08/19/2017 FINDINGS: Brain: Decreased size of extra-axial collection underlying the left pterional craniotomy site with linear hyperdensity that may indicate dural graft material. Small amount of subdural blood along the left tentorial leaflet is unchanged. Hypoattenuation along the posterior limb of the left internal capsule is unchanged. There are aging Blood products in the  left lateral ventricle. The ventricles are slightly larger than on the prior study but there is no hydrocephalus. There is no acute hemorrhage. There is a right frontal approach ventriculostomy catheter with tip in the right lateral ventricle frontal horn. Rightward midline shift measures 4 mm, previously 5 mm. Vascular: Left P-comm aneurysm clip. No abnormal hyperdensity of the dural venous sinuses or major intracranial arteries. Skull: Recent left pterional craniotomy and right frontal burr hole. Skin staples overlie the surgical sites. Sinuses/Orbits: No fluid levels or advanced mucosal thickening of the visualized paranasal sinuses. No mastoid or middle ear effusion. The orbits are normal. IMPRESSION: Continued decrease in size of left convexity subdural hematoma. Improving rightward midline shift. No hydrocephalus or new site of hemorrhage.Expected evolution of left internal capsule posterior limb infarct. Electronically Signed   By: Deatra Robinson M.D.   On: 08/25/2017 05:26    Assessment/Plan: Status post craniotomy: The patient is clinically stable.  We will continue the ventriculostomy for now.  LOS: 12 days     [name] 08/26/2017, 6:40 AM

## 2017-08-26 NOTE — Progress Notes (Signed)
PULMONARY / CRITICAL CARE MEDICINE   Name: Carla Bauer MRN: [id number] DOB: [birth year]    ADMISSION DATE:  08/14/2017 CONSULTATION DATE:  08/14/2017  REFERRING MD:  Patric Dykes - CNSA.   CHIEF COMPLAINT:  Aneurysmal subarachnoid hemorrhage.   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  Remained on vent post op.  SUBJECTIVE:   This is day 10 S/P SAH.  EVD was `reopened today due to leakage around the EVD and a slight increase in ventricle size.  VITAL SIGNS: BP 109/70   Pulse 90   Temp 98.2 F (36.8 C) (Axillary)   Resp 18   Ht  (1.549 m)   Wt 147 lb 14.9 oz (67.1 kg)   SpO2 98%   BMI 27.95 kg/m   INTAKE / OUTPUT: I/O last 3 completed shifts: In: 2700 [NG/GT:2550; IV Piggyback:150] Out: 2099 [Urine:1675; Drains:24; Stool:400]  PHYSICAL EXAMINATION: General: Lethargic and in no distress.        HEENT: Craniotomy site looks good.  There is no difficulty in maintaining her airway.   Neuro: Awake and following commands on the L side. R side remains plegic with evolving contractures. R hemineglect.  Exam is been stable for several days.  Clamping of the EVD did not affect it. CV: S1 and S2 are somewhat distant and regular without murmur rub or gallop.  PULM: Respirations are unlabored, there is symmetric air movement and good air movement throughout, there are no wheezes there are no rhonchi       GI: The abdomen is obese and soft without any organomegaly masses or tenderness. Copious diarrhea.  LABS:  BMET Recent Labs  Lab 08/24/17 0335 08/25/17 0247 08/26/17 0303  NA 161* 160* 153*  K 3.5 3.7 3.1*  CL >130* 129* 123*  CO2 22 21* 21*  BUN CREATININE 0.80 0.71 0.63  GLUCOSE 194* 126* 143*    Electrolytes Recent Labs  Lab 08/24/17 0335 08/25/17 0247 08/26/17 0303  CALCIUM 10.0 10.1 9.8    CBC Recent Labs   Lab 08/21/17 0311 08/22/17 0321 08/23/17 0315  WBC 15.1* 17.8* 19.8*  HGB 10.3* 10.4* 10.7*  HCT 32.5* 33.0* 33.6*  PLT 306 332 389    Coag's No results for input(s): APTT, INR in the last 168 hours.  Sepsis Markers Recent Labs  Lab 08/22/17 0321 08/23/17 0315 08/24/17 0335  PROCALCITON 0.15 0.11 <0.10    ABG No results for input(s): PHART, PCO2ART, PO2ART in the last 168 hours.  Liver Enzymes No results for input(s): AST, ALT, ALKPHOS, BILITOT, ALBUMIN in the last 168 hours.  Cardiac Enzymes No results for input(s): TROPONINI, PROBNP in the last 168 hours.  Glucose Recent Labs  Lab 08/25/17 0805 08/25/17 1121 08/25/17 1536 08/25/17 1939 08/26/17 0005 08/26/17 0359  GLUCAP 135* 180* 109* 161* 195* 105*    Imaging No results found.   STUDIES:  CT Head 5/17 >> increasing hypoattenuation within the left medial temporal lobe extending to left posterior lentiform nucleus & posterior limb of internal capsule consistent with developing early subacute infarction.  Mild decrease in intraventricular hemorrhage, decreased mass effect at 7mm, decreased air CTA Head 5/17 >> no findings of vasospasm, no new large vessel occlusion, left posterior communicating artery aneurysm clip Echo 5/17 >> EF 65 to 70%, grade 1 DD, mild MR, mod TR, PAS 48 mmHg  CULTURES: Urine of 5/20 is growing greater than 10 to the fifth pansensitive E. coli ANTIBIOTICS: Rocephin started 5/21 SIGNIFICANT EVENTS: 5/15 Clipping PCOM aneurysm and clot evacuation.  2 units PRBC's intra-op  LINES/TUBES: EVD 5/15 >>  A-Line 5/15 >> out ETT 5/15 >> 5/17  DISCUSSION: This is a 62 year old with no known past medical history resented with a right hemiplegia.  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  She has undergone craniotomy and clipping of the aneurysm.  She continues to have an EVD in place which was clamped on 5/23.  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   Mental status remains tenuous but currently protecting airway.  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 R hemiplegia  PLAN -  Exam is not substantially different since her EVD was clamped on 5/23, frequent exams continue. Per neuro, nsgy  Continue nimodipine  EVD draining  F/u CT stable/improving  TCD's 5/23 were negative.  Hypernatremia - increase free water.  Slowly correcting.  Hypertension. PLAN -  Off cardene  TCD's yesterday were negative  PRN labetalol  Continue per tube metoprolol   Hypokalemia. Hypernatremia  PLAN -  KVO IVF  F/u chem in am  Replete K PRN   Hyperglycemia. PLAN - increase basal bolus insulin. SSI   Fever.   Treating for UTI DVT prophylaxis - SCD Nutrition - tube feeds Goals of care - full code   Lynnell Catalan, MD 08/26/2017  7:08 AM Pager: [phone] or [phone]

## 2017-08-26 NOTE — Progress Notes (Signed)
PULMONARY / CRITICAL CARE MEDICINE   Name: Carla Bauer MRN: [id number] DOB: 23-Jun-1955    ADMISSION DATE:  08/14/2017 CONSULTATION DATE:  08/14/2017  REFERRING MD:  Patric Dykes - CNSA.   CHIEF COMPLAINT:  Aneurysmal subarachnoid hemorrhage.   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  Remained on vent post op.  SUBJECTIVE:   This is day 11 status post subarachnoid hemorrhage.  Her EVD is open at +10.  She is more alert than when last seen by me and is nodding appropriately to questions.  VITAL SIGNS: BP 120/75   Pulse 90   Temp 99.1 F (37.3 C) (Oral)   Resp 18   Ht  (1.549 m)   Wt 147 lb 14.9 oz (67.1 kg)   SpO2 97%   BMI 27.95 kg/m   INTAKE / OUTPUT: I/O last 3 completed shifts: In: 2750 [NG/GT:2600; IV Piggyback:150] Out: 2099 [Urine:1675; Drains:24; Stool:400]  PHYSICAL EXAMINATION: General: Sitting up in bed in no distress.        HEENT: Craniotomy site looks good.  There is no difficulty in maintaining her airway.   Neuro: She is alert and nods to questions for me.  She moves the left side spontaneously.  There is no movement on the right.   CV: S1 and S2 are regular without murmur rub or gallop   PULM: Respirations are unlabored, there is symmetric air movement, no wheezes  GI: The abdomen is soft without any organomegaly masses or tenderness   LABS:  BMET Recent Labs  Lab 08/24/17 0335 08/25/17 0247 08/26/17 0303  NA 161* 160* 153*  K 3.5 3.7 3.1*  CL >130* 129* 123*  CO2 22 21* 21*  BUN CREATININE 0.80 0.71 0.63  GLUCOSE 194* 126* 143*    Electrolytes Recent Labs  Lab 08/24/17 0335 08/25/17 0247 08/26/17 0303  CALCIUM 10.0 10.1 9.8    CBC Recent Labs  Lab 08/21/17 0311 08/22/17 0321 08/23/17 0315  WBC 15.1* 17.8* 19.8*  HGB 10.3* 10.4* 10.7*  HCT 32.5* 33.0* 33.6*  PLT 306 332  389    Coag's No results for input(s): APTT, INR in the last 168 hours.  Sepsis Markers Recent Labs  Lab 08/22/17 0321 08/23/17 0315 08/24/17 0335  PROCALCITON 0.15 0.11 <0.10    ABG No results for input(s): PHART, PCO2ART, PO2ART in the last 168 hours.  Liver Enzymes No results for input(s): AST, ALT, ALKPHOS, BILITOT, ALBUMIN in the last 168 hours.  Cardiac Enzymes No results for input(s): TROPONINI, PROBNP in the last 168 hours.  Glucose Recent Labs  Lab 08/25/17 1121 08/25/17 1536 08/25/17 1939 08/26/17 0005 08/26/17 0359 08/26/17 0851  GLUCAP 180* 109* 161* 195* 105* 140*    Imaging No results found.   STUDIES:  CT Head 5/17 >> increasing hypoattenuation within the left medial temporal lobe extending to left posterior lentiform nucleus & posterior limb of internal capsule consistent with developing early subacute infarction.  Mild decrease in intraventricular hemorrhage, decreased mass effect at 7mm, decreased air CTA Head 5/17 >> no findings of vasospasm, no new large vessel occlusion, left posterior communicating artery aneurysm clip Echo 5/17 >> EF 65 to 70%, grade 1 DD, mild MR, mod TR, PAS 48 mmHg  CULTURES: Urine of 5/20 is growing greater than 10 to the fifth pansensitive E. coli ANTIBIOTICS: Rocephin started 5/21  SIGNIFICANT EVENTS: 5/15 Clipping PCOM aneurysm and clot evacuation.  2 units PRBC's intra-op  LINES/TUBES: EVD 5/15 >>  A-Line 5/15 >> out ETT 5/15 >> 5/17  DISCUSSION: This is a 62 year old with no known past medical history who presented with a right hemiplegia.  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  She has undergone craniotomy and clipping of the aneurysm.  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  No difficulty in maintaining airway with current mental status.    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 R hemiplegia  PLAN -    Continue nimodipine  EVD draining  F/u CT stable/improving  TCD's 5/23 were negative.  Hypernatremia - increase free water.  Slowly correcting.  Hypertension. PLAN -  Off cardene  TCD's yesterday were negative  PRN labetalol  Continue per tube metoprolol   Hypokalemia. Hypernatremia  PLAN -  KVO IVF  F/u chem in am  Replete K PRN   Hyperglycemia. PLAN - increase basal bolus insulin. SSI   Fever.   Treating for UTI DVT prophylaxis - SCD Nutrition - tube feeds Goals of care - full code   Lynnell Catalan, MD 08/26/2017  12:21 PM Pager: [phone] or [phone]

## 2017-08-26 NOTE — Plan of Care (Signed)
Patient is tolerating tube feedings at goal rate.

## 2017-08-27 LAB — BASIC METABOLIC PANEL
ANION GAP: 9 (ref 5–15)
BUN: 18 mg/dL (ref 6–20)
CALCIUM: 9.4 mg/dL (ref 8.9–10.3)
CO2: 23 mmol/L (ref 22–32)
Chloride: 115 mmol/L — ABNORMAL HIGH (ref 101–111)
Creatinine, Ser: 0.55 mg/dL (ref 0.44–1.00)
Glucose, Bld: 165 mg/dL — ABNORMAL HIGH (ref 65–99)
POTASSIUM: 3.4 mmol/L — AB (ref 3.5–5.1)
Sodium: 147 mmol/L — ABNORMAL HIGH (ref 135–145)

## 2017-08-27 LAB — GLUCOSE, CAPILLARY
GLUCOSE-CAPILLARY: 145 mg/dL — AB (ref 65–99)
GLUCOSE-CAPILLARY: 95 mg/dL (ref 65–99)
GLUCOSE-CAPILLARY: 114 mg/dL — AB (ref 65–99)
Glucose-Capillary: 140 mg/dL — ABNORMAL HIGH (ref 65–99)
Glucose-Capillary: 115 mg/dL — ABNORMAL HIGH (ref 65–99)
Glucose-Capillary: 215 mg/dL — ABNORMAL HIGH (ref 65–99)

## 2017-08-27 MED ORDER — POTASSIUM CHLORIDE 20 MEQ PO PACK
20.0000 meq | PACK | Freq: Every day | ORAL | Status: AC
  Administered 2017-08-27 – 2017-08-29 (×3): 20 meq
  Filled 2017-08-27 (×3): qty 1

## 2017-08-27 NOTE — Progress Notes (Signed)
Physical Therapy Treatment Patient Details Name: Carla Bauer MRN: [id number] DOB: 1956/03/20 Today's Date: 08/27/2017    History of Present Illness pt is a 62 y/o female with no significant medical history admitted with R sided weakness.  Pt s/p L crani for aneurysm clipping, evacuation of left temporal hematoma and placement of right frontal ventricular drain.  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  With maximal cuing, pt able to draw eyes right toward midline to focus on target.  Emphasis on transition to EOB, sitting balance, standing and transfer to the chair.   Follow Up Recommendations  CIR     Equipment Recommendations  Other (comment)(tba)    Recommendations for Other Services       Precautions / Restrictions Precautions Precautions: Fall Precaution Comments: watch for EVD clamped     Mobility  Bed Mobility Overal bed mobility: Needs Assistance Bed Mobility: Supine to Sit Rolling: Mod assist   Supine to sit: Mod assist     General bed mobility comments: pt requires (A) to sequence task but reaching with toward EOB and L fixated gaze  Transfers Overall transfer level: Needs assistance Equipment used: 2 person hand held assist Transfers: Stand Pivot Transfers Sit to Stand: Max assist;+2 physical assistance         General transfer comment: Pt needs (A) to power upa dn to pivot to chair pt attempting to step and LOB due to R LE not holding patient with sudden knee flexion  Ambulation/Gait                 Stairs             Wheelchair Mobility    Modified Rankin (Stroke Patients Only)       Balance Overall balance assessment: Needs assistance Sitting-balance support: Single extremity supported;Feet supported Sitting balance-Leahy Scale: Poor Sitting balance - Comments:  needed external support and L UE   Standing balance support: Single extremity supported;During functional activity Standing balance-Leahy Scale: Poor                              Cognition Arousal/Alertness: Awake/alert Behavior During Therapy: Flat affect;WFL for tasks assessed/performed Overall Cognitive Status: Impaired/Different from baseline Area of Impairment: Following commands                       Following Commands: Follows one step commands with increased time       General Comments: pt reports people arriving to room - "family" pt more verbal and asking for food this session      Exercises      General Comments General comments (skin integrity, edema, etc.): Pt with L gaze preference.  Worked to draw eyes right to focus on the therapist with minimal success, but eyes were not stuck as severely left as on other days.      Pertinent Vitals/Pain Pain Assessment: Faces Faces Pain Scale: Hurts little more Pain Location: L leg Pain Descriptors / Indicators: Grimacing;Guarding Pain Intervention(s): Monitored during session    Home Living                      Prior Function            PT Goals (current goals can now be found in  the care plan section) Acute Rehab PT Goals Patient Stated Goal: pt unable to state.  pt's children interested in rehab. PT Goal Formulation: With patient/family Time For Goal Achievement: 09/02/17 Potential to Achieve Goals: Fair Progress towards PT goals: Progressing toward goals    Frequency    Min 3X/week      PT Plan Current plan remains appropriate    Co-evaluation PT/OT/SLP Co-Evaluation/Treatment: Yes Reason for Co-Treatment: Complexity of the patient's impairments (multi-system involvement) PT goals addressed during session: Mobility/safety with mobility OT goals addressed during session: ADL's and self-care;Proper use of Adaptive equipment and DME;Strengthening/ROM      AM-PAC PT "6  Clicks" Daily Activity  Outcome Measure  Difficulty turning over in bed (including adjusting bedclothes, sheets and blankets)?: Unable Difficulty moving from lying on back to sitting on the side of the bed? : Unable Difficulty sitting down on and standing up from a chair with arms (e.g., wheelchair, bedside commode, etc,.)?: Unable Help needed moving to and from a bed to chair (including a wheelchair)?: Total Help needed walking in hospital room?: Total Help needed climbing 3-5 steps with a railing? : Total 6 Click Score: 6    End of Session   Activity Tolerance: Patient limited by fatigue Patient left: in chair;with call bell/phone within reach;with chair alarm set;with nursing/sitter in room Nurse Communication: Mobility status PT Visit Diagnosis: Hemiplegia and hemiparesis Hemiplegia - Right/Left: Right Hemiplegia - dominant/non-dominant: Dominant Hemiplegia - caused by: Cerebral infarction     Time: 1478-2956 PT Time Calculation (min) (ACUTE ONLY): 23 min  Charges:  $Therapeutic Activity: 8-22 mins                    G Codes:       September 07, 2017  Paulden Bing, PT [phone]  (pager)   Eliseo Gum Glendine Swetz 09-07-2017, 5:41 PM

## 2017-08-27 NOTE — Progress Notes (Signed)
Nutrition Follow-up  DOCUMENTATION CODES:   Not applicable  INTERVENTION:   Continue with:  Osmolite 1.2 @ 50 ml/hr (1200 ml/day) 30 ml Prostat daily Free water 400 ml Q6 hours  Provides: 1540 kcal, 81 grams protein, and 973 ml free water.   NUTRITION DIAGNOSIS:   Inadequate oral intake related to inability to eat as evidenced by NPO status.  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    5/17 extubated, cortrak placed, tip is in the stomach  5/23 EVD clamped  Pt discussed during ICU rounds and with RN.  Failed drain challenge. Pt for shunt tomorrow.  Per SLP pt tolerating apple sauce and liquids.  Will re-evaluate swallowing s/p shunting.   Pt originally ordered Osmolite formula on 5/17. An order was placed for Vital High Protein on 5/26, which pt received today. RD discussed situation with RN. RD to d/c Vital High Protein and continue with previous regimen.   I/O: +4125 ml since admit UOP: 1100 ml x 24 hours  Medications reviewed.  Labs reviewed: Na 147 (L) K 3.4 (L)  Diet Order:   Diet Order           Diet NPO time specified  Diet effective now          EDUCATION NEEDS:   No education needs have been identified at this time  Skin:  Skin Assessment: Reviewed RN Assessment  Last BM:  5/28- 650 ml x 24 hrs rectal tube  Height:   Ht Readings from Last 1 Encounters:  08/14/17  (1.549 m)    Weight:   Wt Readings from Last 1 Encounters:  08/27/17 151 lb 7.3 oz (68.7 kg)    Ideal Body Weight:  47.7 kg  BMI:  Body mass index is 28.62 kg/m.  Estimated Nutritional Needs:   Kcal:  1500-1700  Protein:  80-90 grams  Fluid:  >  1.5 L/day    Vanessa Kick RD, LDN Clinical Nutrition Pager # - [phone]

## 2017-08-27 NOTE — Progress Notes (Signed)
Occupational Therapy Treatment Patient Details Name: Carla Bauer MRN: [id number] DOB: Apr 04, 1955 Today's Date: 08/27/2017    History of present illness pt is a 62 y/o female with no significant medical history admitted with R sided weakness.  Pt s/p L crani for aneurysm clipping, evacuation of left temporal hematoma and placement of right frontal ventricular drain.  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 Pt with L gaze preference and tracked to mid line to find family member on their arrival. Pt requires total +2 (A) for transfers at this time.    Follow Up Recommendations  CIR;Supervision/Assistance - 24 hour    Equipment Recommendations  None recommended by OT    Recommendations for Other Services Rehab consult    Precautions / Restrictions Precautions Precautions: Fall Precaution Comments: watch for EVD clamped        Mobility Bed Mobility Overal bed mobility: Needs Assistance Bed Mobility: Supine to Sit Rolling: Mod assist   Supine to sit: Mod assist     General bed mobility comments: pt requires (A) to sequence task but reaching with toward EOB and L fixated gaze  Transfers Overall transfer level: Needs assistance Equipment used: 2 person hand held assist Transfers: Stand Pivot Transfers Sit to Stand: Max assist;+2 physical assistance         General transfer comment: Pt needs (A) to power upa dn to pivot to chair pt attempting to step and LOB due to R LE not holding patient with sudden knee flexion    Balance Overall balance assessment: Needs assistance Sitting-balance support: Single extremity supported;Feet supported Sitting balance-Leahy Scale: Poor Sitting balance - Comments: pt with L gaze perfence.    Standing balance support: Single extremity supported;During functional activity Standing  balance-Leahy Scale: Poor                             ADL either performed or assessed with clinical judgement   ADL Overall ADL's : Needs assistance/impaired                           Toilet Transfer Details (indicate cue type and reason): flexiseal at this time           General ADL Comments: pt transfer from bed to chair simualted toilet transfer.      Vision       Perception     Praxis      Cognition Arousal/Alertness: Awake/alert Behavior During Therapy: Flat affect;WFL for tasks assessed/performed Overall Cognitive Status: Impaired/Different from baseline Area of Impairment: Following commands                       Following Commands: Follows one step commands with increased time       General Comments: pt reports people arriving to room - "family" pt more verbalize and asking for food this session        Exercises     Shoulder Instructions       General Comments      Pertinent Vitals/ Pain       Pain Assessment: Faces Faces Pain Scale: Hurts little more Pain Location: L leg Pain Descriptors / Indicators: Grimacing;Guarding Pain Intervention(s): Monitored during session;Premedicated before session;Repositioned  Home Living  Prior Functioning/Environment              Frequency           Progress Toward Goals  OT Goals(current goals can now be found in the care plan section)  Progress towards OT goals: Progressing toward goals  Acute Rehab OT Goals Patient Stated Goal: pt unable to state.  pt's children interested in rehab. OT Goal Formulation: With family Time For Goal Achievement: 09/02/17 Potential to Achieve Goals: Good ADL Goals Pt Will Perform Grooming: with mod assist;sitting Pt Will Perform Upper Body Bathing: with mod assist;sitting Pt Will Transfer to Toilet: with mod assist;squat pivot transfer;bedside commode Additional ADL Goal  #1: Pt will locate items at midline with min cues during ADL activiites Additional ADL Goal #2: Family will be independent with PROM and positioning of Rt UE  Plan Discharge plan remains appropriate    Co-evaluation    PT/OT/SLP Co-Evaluation/Treatment: Yes Reason for Co-Treatment: Complexity of the patient's impairments (multi-system involvement);For patient/therapist safety   OT goals addressed during session: ADL's and self-care;Proper use of Adaptive equipment and DME;Strengthening/ROM      AM-PAC PT "6 Clicks" Daily Activity     Outcome Measure   Help from another person eating meals?: Total Help from another person taking care of personal grooming?: A Lot Help from another person toileting, which includes using toliet, bedpan, or urinal?: Total Help from another person bathing (including washing, rinsing, drying)?: Total Help from another person to put on and taking off regular upper body clothing?: Total Help from another person to put on and taking off regular lower body clothing?: Total 6 Click Score: 7    End of Session    OT Visit Diagnosis: Unsteadiness on feet (R26.81);Cognitive communication deficit (R41.841);Hemiplegia and hemiparesis Symptoms and signs involving cognitive functions: Cerebral infarction;Nontraumatic SAH Hemiplegia - Right/Left: Right Hemiplegia - dominant/non-dominant: Dominant Hemiplegia - caused by: Nontraumatic SAH;Cerebral infarction   Activity Tolerance Patient tolerated treatment well   Patient Left in chair;with call bell/phone within reach;with chair alarm set;with nursing/sitter in room;with family/visitor present(RN to apply mitten)   Nurse Communication Mobility status;Precautions        Time: 5621-3086 OT Time Calculation (min): 24 min  Charges: OT General Charges $OT Visit: 1 Visit OT Treatments $Therapeutic Activity: 8-22 mins   Mateo Flow   OTR/L Pager: [phone] Office: [phone] .    Boone Master  B 08/27/2017, 3:31 PM

## 2017-08-27 NOTE — Progress Notes (Signed)
34 staples removed from crani site.  Incision remains clean, dry, intact.

## 2017-08-27 NOTE — Progress Notes (Signed)
PULMONARY / CRITICAL CARE MEDICINE   Name: Carla Bauer MRN: [id number] DOB: 12-23-1955    ADMISSION DATE:  08/14/2017 CONSULTATION DATE:  08/14/2017  REFERRING MD:  Patric Dykes - CNSA.   CHIEF COMPLAINT:  Aneurysmal subarachnoid hemorrhage.   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  Remained on vent post op.  SUBJECTIVE:   This is day 13 status post subarachnoid hemorrhage.  Her EVD remains open at +10.  By report she is verbally interactive with other members of the care team she is not verbally interactive with me.  TCD's from 5/27 have not yet been read.  VITAL SIGNS: BP 115/72   Pulse 84   Temp (!) 97 F (36.1 C) (Axillary)   Resp 18   Ht  (1.549 m)   Wt 151 lb 7.3 oz (68.7 kg)   SpO2 100%   BMI 28.62 kg/m   INTAKE / OUTPUT: I/O last 3 completed shifts: In: 3820 [Other:20; NG/GT:3550; IV Piggyback:250] Out: 2521 [Urine:1800; Drains:71; Stool:650]  PHYSICAL EXAMINATION: General: She is sitting up in bed in no distress.          HEENT: Craniotomy site is good.  She is having no difficulties in maintaining her airway, no sonorous respirations and no drooling.    Neuro: She is not following instructions for me today.  She does move the left side spontaneously and purposefully.  Pupils are equal at 4 mm.     CV: S1 and S2 are regular without murmur rub or gallop    PULM: Respirations are unlabored, there is symmetric air movement, no wheezes.  The lungs are clear to anterior exam  GI: Abdomen is obese and soft without any organomegaly masses or tenderness    LABS:  BMET Recent Labs  Lab 08/26/17 0303 08/26/17 2039 08/27/17 0249  NA 153* 147* 147*  K 3.1* 3.5 3.4*  CL 123* 117* 115*  CO2 21* 23 23  BUN CREATININE 0.63 0.59 0.55  GLUCOSE 143* 193* 165*    Electrolytes Recent Labs  Lab 08/26/17 0303  08/26/17 2039 08/27/17 0249  CALCIUM 9.8 9.5 9.4    CBC Recent Labs  Lab 08/21/17 0311 08/22/17 0321 08/23/17 0315  WBC 15.1* 17.8* 19.8*  HGB 10.3* 10.4* 10.7*  HCT 32.5* 33.0* 33.6*  PLT 306 332 389    Coag's No results for input(s): APTT, INR in the last 168 hours.  Sepsis Markers Recent Labs  Lab 08/22/17 0321 08/23/17 0315 08/24/17 0335  PROCALCITON 0.15 0.11 <0.10    ABG No results for input(s): PHART, PCO2ART, PO2ART in the last 168 hours.  Liver Enzymes No results for input(s): AST, ALT, ALKPHOS, BILITOT, ALBUMIN in the last 168 hours.  Cardiac Enzymes No results for input(s): TROPONINI, PROBNP in the last 168 hours.  Glucose Recent Labs  Lab 08/26/17 1242 08/26/17 1621 08/26/17 1954 08/26/17 2344 08/27/17 0312 08/27/17 0830  GLUCAP 165* 126* 159* 212* 145* 95    Imaging No results found.   STUDIES:  CT Head 5/17 >> increasing hypoattenuation within the left medial temporal lobe extending to left posterior lentiform nucleus & posterior limb of internal capsule consistent with developing early subacute infarction.  Mild decrease in intraventricular hemorrhage, decreased mass effect at 7mm, decreased air CTA Head 5/17 >> no findings of vasospasm, no new large vessel occlusion, left posterior  communicating artery aneurysm clip Echo 5/17 >> EF 65 to 70%, grade 1 DD, mild MR, mod TR, PAS 48 mmHg  CULTURES: Urine of 5/20 is growing greater than 10 to the fifth pansensitive E. coli ANTIBIOTICS: Rocephin started 5/21 SIGNIFICANT EVENTS: 5/15 Clipping PCOM aneurysm and clot evacuation.  2 units PRBC's intra-op  LINES/TUBES: EVD 5/15 >>  A-Line 5/15 >> out ETT 5/15 >> 5/17  DISCUSSION: This is a 62 year old with no known past medical history who presented with a right hemiplegia.  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  This is day 13 post bleed, she has not had any evidence of spasm.  ASSESSMENT / PLAN:  Compromised airway in setting  stroke, ICH.  She is having no airway issues at present.    PCOM aneurysm s/p clipping. Lt thalamus and optic radiation infarct. R hemiplegia  PLAN -  Ental status seems to wax and wane but she is still quite lethargic.  This coupled with a white count of 19,000 makes me concerned that I have missed an infection.  Her respiratory exam is entirely benign, I will be repeating a urine culture today to ensure that we have eradicated the E. coli that was present in her urine earlier. Continue nimodipine  EVD draining  F/u CT stable/improving  TCD's 5/23 were negative.  Hypernatremia - increase free water.  Slowly correcting.  Hypertension. PLAN -  Off cardene  TCD's yesterday were negative  PRN labetalol  Continue per tube metoprolol   Hypokalemia. Hypernatremia  PLAN -  KVO IVF  F/u chem in am  Replete K PRN   Hyperglycemia. PLAN - increase basal bolus insulin. SSI   Fever.   Treating for UTI DVT prophylaxis - SCD Nutrition - tube feeds Goals of care - full code   Lynnell Catalan, MD 08/27/2017  10:10 AM Pager: [phone] or [phone]

## 2017-08-27 NOTE — Progress Notes (Signed)
  Speech Language Pathology Treatment: Dysphagia;Cognitive-Linquistic  Patient Details Name: Carla Bauer MRN: [id number] DOB: 1956-04-01 Today's Date: 08/27/2017 Time: 2956-2130 SLP Time Calculation (min) (ACUTE ONLY): 8 min  Assessment / Plan / Recommendation Clinical Impression  Brief session today, pts function remains consistent with yesterdays presentation. Pt is awake, verbally responsive to enthusiastic interaction. She excels at automatic social language, though volume is low and poorly intelligible at times. Pt has poor ability to respond to confrontational language unless it is extremely basic, is only oriented to self. Pt can answer Y/N questions about orientation to place suggesting some improving awareness. Pt is again able to take sips of water with minimal cough on initial sip. Expect pt to tolerate PO fairly well though objective assessment is warranted. There is a plan for surgery tomorrow, so will f/u after that is complete for potential FEES.    HPI HPI: Ms. Carla Bauer is a 62 y.o. female with no significant history found having fallen out of the bed with R arm weakness and altered mental status. 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 Intubated ETT 5/15- 5/17.      SLP Plan  Continue with current plan of care       Recommendations  Diet recommendations: NPO                General recommendations: Rehab consult Oral Care Recommendations: Oral care BID;Oral care QID Follow up Recommendations: Inpatient Rehab SLP Visit Diagnosis: Attention and concentration deficit Attention and concentration deficit following: Cerebral infarction Plan: Continue with current plan of care       GO                Jadan Rouillard, Riley Nearing 08/27/2017, 11:00 AM

## 2017-08-28 ENCOUNTER — Inpatient Hospital Stay (HOSPITAL_COMMUNITY): Payer: Medicaid Other

## 2017-08-28 DIAGNOSIS — I609 Nontraumatic subarachnoid hemorrhage, unspecified: Secondary | ICD-10-CM

## 2017-08-28 LAB — CBC WITH DIFFERENTIAL/PLATELET
Abs Immature Granulocytes: 0.1 10*3/uL (ref 0.0–0.1)
BASOS PCT: 0 %
Basophils Absolute: 0.1 10*3/uL (ref 0.0–0.1)
EOS ABS: 0.1 10*3/uL (ref 0.0–0.7)
EOS PCT: 1 %
HEMATOCRIT: 30.5 % — AB (ref 36.0–46.0)
Hemoglobin: 9.7 g/dL — ABNORMAL LOW (ref 12.0–15.0)
Immature Granulocytes: 1 %
LYMPHS ABS: 3.2 10*3/uL (ref 0.7–4.0)
Lymphocytes Relative: 21 %
MCH: 29.1 pg (ref 26.0–34.0)
MCHC: 31.8 g/dL (ref 30.0–36.0)
MCV: 91.6 fL (ref 78.0–100.0)
Monocytes Absolute: 0.6 10*3/uL (ref 0.1–1.0)
Monocytes Relative: 4 %
Neutro Abs: 11.4 10*3/uL — ABNORMAL HIGH (ref 1.7–7.7)
Neutrophils Relative %: 73 %
Platelets: 444 10*3/uL — ABNORMAL HIGH (ref 150–400)
RBC: 3.33 MIL/uL — AB (ref 3.87–5.11)
RDW: 15.5 % (ref 11.5–15.5)
WBC: 15.5 10*3/uL — ABNORMAL HIGH (ref 4.0–10.5)

## 2017-08-28 LAB — GLUCOSE, CAPILLARY
GLUCOSE-CAPILLARY: 172 mg/dL — AB (ref 65–99)
GLUCOSE-CAPILLARY: 182 mg/dL — AB (ref 65–99)
Glucose-Capillary: 151 mg/dL — ABNORMAL HIGH (ref 65–99)
Glucose-Capillary: 144 mg/dL — ABNORMAL HIGH (ref 65–99)
Glucose-Capillary: 135 mg/dL — ABNORMAL HIGH (ref 65–99)

## 2017-08-28 LAB — BASIC METABOLIC PANEL
Anion gap: 10 (ref 5–15)
BUN: 13 mg/dL (ref 6–20)
CALCIUM: 9.5 mg/dL (ref 8.9–10.3)
CHLORIDE: 108 mmol/L (ref 101–111)
CO2: 24 mmol/L (ref 22–32)
CREATININE: 0.55 mg/dL (ref 0.44–1.00)
GFR calc Af Amer: >60 mL/min (ref >60)
Glucose, Bld: 190 mg/dL — ABNORMAL HIGH (ref 65–99)
Potassium: 3.4 mmol/L — ABNORMAL LOW (ref 3.5–5.1)
Sodium: 142 mmol/L (ref 135–145)

## 2017-08-28 LAB — URINE CULTURE: CULTURE: NO GROWTH

## 2017-08-28 MED ORDER — CHLORHEXIDINE GLUCONATE 0.12 % MT SOLN
OROMUCOSAL | Status: AC
  Filled 2017-08-28: qty 15

## 2017-08-28 NOTE — Progress Notes (Signed)
  NEUROSURGERY PROGRESS NOTE   Pt seen and examined. No issues overnight.   EXAM: Temp:  [97.2 F (36.2 C)-98.3 F (36.8 C)] 97.8 F (36.6 C) (05/29 1630) Pulse Rate:  [71-96] 78 (05/29 1500) Resp:  [13-33] 13 (05/29 1500) BP: (107-147)/(74-90) 140/90 (05/29 1500) SpO2:  [97 %-100 %] 99 % (05/29 1500) Weight:  [66.3 kg (146 lb 2.6 oz)] 66.3 kg (146 lb 2.6 oz) (05/29 0500) Intake/Output      05/28 0701 - 05/29 0700 05/29 0701 - 05/30 0700   Other     NG/GT 2712.5 800   IV Piggyback 150    Total Intake(mL/kg) 2862.5 (43.2) 800 (12.1)   Urine (mL/kg/hr) 1800 (1.1)    Drains 31 24   Stool 0    Total Output 1831 24   Net +1031.5 +776        Urine Occurrence 1 x    Stool Occurrence 3 x     Awake, alert Answering simple questions Follows commands LUE/LLE Right hemiplegia EVD in place  LABS: Lab Results  Component Value Date   CREATININE 0.55 08/28/2017   BUN 13 08/28/2017   NA 142 08/28/2017   K 3.4 (L) 08/28/2017   CL 108 08/28/2017   CO2 24 08/28/2017   Lab Results  Component Value Date   WBC 15.5 (H) 08/28/2017   HGB 9.7 (L) 08/28/2017   HCT 30.5 (L) 08/28/2017   MCV 91.6 08/28/2017   PLT 444 (H) 08/28/2017    IMPRESSION: - 62 y.o. female s/p clipping ruptured left Pcom aneurysm, stable right hemiplegia. Shunt dependent.  PLAN: - OR tomorrow for lap-assisted VP shunt - Will clamp EVD this evening for OR tomorrow  I have reviewed the indications for shunt placement with the patient's family. Risks of the surgery were reviewed including risk of infection, malfunction, intracranial bleeding, and injury to abdominal contents. General risks of anesthesia including heart attack, stroke, and blood clots also reviewed. All questions were answered and verbal consent was obtained.

## 2017-08-28 NOTE — Progress Notes (Signed)
Transcranial Doppler  Date POD PCO2 HCT BP  MCA ACA PCA OPHT SIPH VERT Basilar  5/17 RS     Right  Left   72  49   -59     34  42  32              5/20 GC     Right  Left   59  49   -23     - 5/22,rs     Right  Left   70  67   -64  -73   30  41   17  22   47  34   -21  *   *      5/24,rs      Right  Left   71  30   -51  *   46  30   *  23   *      5/27,rs      Right  Left   60  63   -42  -55   47  33-   *  -19   *      5/29 MS     Right  Left   51  19   -33  -28   -27  - *  14   *  -10   *           Right  Left                                        MCA = Middle Cerebral Artery      OPHT = Opthalmic Artery     BASILAR = Basilar Artery   ACA = Anterior Cerebral Artery     SIPH = Carotid Siphon PCA = Posterior Cerebral Artery   VERT = Verterbral Artery                   Normal MCA = 62+\-12 ACA = 50+\-12 PCA = 42+\-23   08/21/17 - (*) not insonated.  08/23/17 Poor occipital window, (*) not insonated. rs 08/26/17 - (*) not insonated,rs 08/28/17- (*) not insonated, poor window and poor patient cooperation. MS  08/28/2017 11:44 AM Gertie Fey, BS, RVT, RDCS, RDMS

## 2017-08-28 NOTE — Progress Notes (Signed)
  NEUROSURGERY PROGRESS NOTE   Pt seen and examined. No issues overnight. Events of the long weekend reviewed, was unable to tolerate clamped EVD.  EXAM: Temp:  [97.2 F (36.2 C)-98.3 F (36.8 C)] 97.8 F (36.6 C) (05/29 1630) Pulse Rate:  [71-96] 78 (05/29 1500) Resp:  [13-33] 13 (05/29 1500) BP: (107-147)/(74-90) 140/90 (05/29 1500) SpO2:  [97 %-100 %] 99 % (05/29 1500) Weight:  [66.3 kg (146 lb 2.6 oz)] 66.3 kg (146 lb 2.6 oz) (05/29 0500) Intake/Output      05/28 0701 - 05/29 0700 05/29 0701 - 05/30 0700   Other     NG/GT 2712.5 800   IV Piggyback 150    Total Intake(mL/kg) 2862.5 (43.2) 800 (12.1)   Urine (mL/kg/hr) 1800 (1.1)    Drains 31 24   Stool 0    Total Output 1831 24   Net +1031.5 +776        Urine Occurrence 1 x    Stool Occurrence 3 x     Awake, alert Not verbal with me Follows commands LUE/LLE Right hemiplegia EVD in place, functional  LABS: Lab Results  Component Value Date   CREATININE 0.55 08/28/2017   BUN 13 08/28/2017   NA 142 08/28/2017   K 3.4 (L) 08/28/2017   CL 108 08/28/2017   CO2 24 08/28/2017   Lab Results  Component Value Date   WBC 15.5 (H) 08/28/2017   HGB 9.7 (L) 08/28/2017   HCT 30.5 (L) 08/28/2017   MCV 91.6 08/28/2017   PLT 444 (H) 08/28/2017    IMPRESSION: - 62 y.o. female s/p clipping of ruptured left Pcom aneurysm. 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  PLAN: - Will plan on VPS later this week. - Cont supportive care.

## 2017-08-29 ENCOUNTER — Encounter (HOSPITAL_COMMUNITY)
Admission: EM | Disposition: A | Discharge status: Rehabilitation Facility | Payer: Self-pay | Source: Home / Self Care | Attending: Neurosurgery

## 2017-08-29 ENCOUNTER — Inpatient Hospital Stay (HOSPITAL_COMMUNITY): Payer: Medicaid Other | Admitting: Certified Registered Nurse Anesthetist

## 2017-08-29 HISTORY — PX: VENTRICULOPERITONEAL SHUNT: SHX204

## 2017-08-29 HISTORY — PX: LAPAROSCOPIC REVISION VENTRICULAR-PERITONEAL (V-P) SHUNT: SHX5924

## 2017-08-29 LAB — GLUCOSE, CAPILLARY
GLUCOSE-CAPILLARY: 101 mg/dL — AB (ref 65–99)
GLUCOSE-CAPILLARY: 105 mg/dL — AB (ref 65–99)
Glucose-Capillary: 100 mg/dL — ABNORMAL HIGH (ref 65–99)
Glucose-Capillary: 104 mg/dL — ABNORMAL HIGH (ref 65–99)
Glucose-Capillary: 200 mg/dL — ABNORMAL HIGH (ref 65–99)
Glucose-Capillary: 243 mg/dL — ABNORMAL HIGH (ref 65–99)
Glucose-Capillary: 79 mg/dL (ref 65–99)

## 2017-08-29 LAB — SURGICAL PCR SCREEN
MRSA, PCR: NEGATIVE
Staphylococcus aureus: NEGATIVE

## 2017-08-29 SURGERY — SHUNT INSERTION VENTRICULAR-PERITONEAL
Anesthesia: General | Laterality: Right

## 2017-08-29 MED ORDER — SODIUM CHLORIDE 0.9 % IV SOLN
INTRAVENOUS | Status: DC | PRN
  Administered 2017-08-29: 16:00:00

## 2017-08-29 MED ORDER — ONDANSETRON HCL 4 MG/2ML IJ SOLN
INTRAMUSCULAR | Status: DC | PRN
  Administered 2017-08-29: 4 mg via INTRAVENOUS

## 2017-08-29 MED ORDER — OXYCODONE HCL 5 MG/5ML PO SOLN: 5.0000 mg | Freq: Once | ORAL | Status: DC | PRN

## 2017-08-29 MED ORDER — ONDANSETRON HCL 4 MG/2ML IJ SOLN
INTRAMUSCULAR | Status: AC
  Filled 2017-08-29: qty 2

## 2017-08-29 MED ORDER — PROMETHAZINE HCL 25 MG PO TABS: 12.5000 mg | ORAL_TABLET | ORAL | Status: DC | PRN

## 2017-08-29 MED ORDER — FENTANYL CITRATE (PF) 100 MCG/2ML IJ SOLN: 25.0000 ug | INTRAMUSCULAR | Status: DC | PRN

## 2017-08-29 MED ORDER — ROCURONIUM BROMIDE 50 MG/5ML IV SOLN
INTRAVENOUS | Status: AC
  Filled 2017-08-29: qty 1

## 2017-08-29 MED ORDER — BACITRACIN ZINC 500 UNIT/GM EX OINT
TOPICAL_OINTMENT | CUTANEOUS | Status: DC | PRN
  Administered 2017-08-29: 1 via TOPICAL

## 2017-08-29 MED ORDER — FENTANYL CITRATE (PF) 250 MCG/5ML IJ SOLN
INTRAMUSCULAR | Status: AC
  Filled 2017-08-29: qty 5

## 2017-08-29 MED ORDER — BUPIVACAINE-EPINEPHRINE 0.25% -1:200000 IJ SOLN
INTRAMUSCULAR | Status: DC | PRN
  Administered 2017-08-29: 7 mL

## 2017-08-29 MED ORDER — SUGAMMADEX SODIUM 200 MG/2ML IV SOLN
INTRAVENOUS | Status: AC
  Filled 2017-08-29: qty 2

## 2017-08-29 MED ORDER — LABETALOL HCL 5 MG/ML IV SOLN: 10.0000 mg | INTRAVENOUS | Status: DC | PRN

## 2017-08-29 MED ORDER — LIDOCAINE 2% (20 MG/ML) 5 ML SYRINGE
INTRAMUSCULAR | Status: AC
  Filled 2017-08-29: qty 5

## 2017-08-29 MED ORDER — BUPIVACAINE-EPINEPHRINE (PF) 0.25% -1:200000 IJ SOLN
INTRAMUSCULAR | Status: AC
  Filled 2017-08-29: qty 30

## 2017-08-29 MED ORDER — PROPOFOL 10 MG/ML IV BOLUS
INTRAVENOUS | Status: DC | PRN
  Administered 2017-08-29: 120 mg via INTRAVENOUS

## 2017-08-29 MED ORDER — SUCCINYLCHOLINE CHLORIDE 200 MG/10ML IV SOSY
PREFILLED_SYRINGE | INTRAVENOUS | Status: AC
  Filled 2017-08-29: qty 10

## 2017-08-29 MED ORDER — FENTANYL CITRATE (PF) 100 MCG/2ML IJ SOLN
INTRAMUSCULAR | Status: DC | PRN
  Administered 2017-08-29: 50 ug via INTRAVENOUS
  Administered 2017-08-29: 100 ug via INTRAVENOUS
  Administered 2017-08-29: 50 ug via INTRAVENOUS

## 2017-08-29 MED ORDER — LIDOCAINE HCL (CARDIAC) PF 100 MG/5ML IV SOSY
PREFILLED_SYRINGE | INTRAVENOUS | Status: DC | PRN
  Administered 2017-08-29: 80 mg via INTRAVENOUS

## 2017-08-29 MED ORDER — BUPIVACAINE HCL (PF) 0.25 % IJ SOLN
INTRAMUSCULAR | Status: AC
  Filled 2017-08-29: qty 30

## 2017-08-29 MED ORDER — BACITRACIN ZINC 500 UNIT/GM EX OINT
TOPICAL_OINTMENT | CUTANEOUS | Status: AC
  Filled 2017-08-29: qty 28.35

## 2017-08-29 MED ORDER — DEXAMETHASONE SODIUM PHOSPHATE 10 MG/ML IJ SOLN
INTRAMUSCULAR | Status: AC
  Filled 2017-08-29: qty 1

## 2017-08-29 MED ORDER — SODIUM CHLORIDE 0.9 % IR SOLN
Status: DC | PRN
  Administered 2017-08-29: 1

## 2017-08-29 MED ORDER — CHLORHEXIDINE GLUCONATE 0.12 % MT SOLN
15.0000 mL | Freq: Two times a day (BID) | OROMUCOSAL | Status: DC
  Administered 2017-08-29 – 2017-09-03 (×9): 15 mL via OROMUCOSAL
  Filled 2017-08-29 (×9): qty 15

## 2017-08-29 MED ORDER — ROCURONIUM BROMIDE 100 MG/10ML IV SOLN
INTRAVENOUS | Status: DC | PRN
  Administered 2017-08-29: 50 mg via INTRAVENOUS

## 2017-08-29 MED ORDER — SUGAMMADEX SODIUM 500 MG/5ML IV SOLN
INTRAVENOUS | Status: AC
  Filled 2017-08-29: qty 5

## 2017-08-29 MED ORDER — PROPOFOL 10 MG/ML IV BOLUS
INTRAVENOUS | Status: AC
  Filled 2017-08-29: qty 20

## 2017-08-29 MED ORDER — SODIUM CHLORIDE 0.9 % IV SOLN
INTRAVENOUS | Status: DC
  Administered 2017-08-29 – 2017-09-01 (×3): via INTRAVENOUS

## 2017-08-29 MED ORDER — MUPIROCIN 2 % EX OINT
1.0000 "application " | TOPICAL_OINTMENT | Freq: Two times a day (BID) | CUTANEOUS | Status: DC
  Filled 2017-08-29: qty 22

## 2017-08-29 MED ORDER — THROMBIN 5000 UNITS EX SOLR
CUTANEOUS | Status: AC
  Filled 2017-08-29: qty 15000

## 2017-08-29 MED ORDER — ORAL CARE MOUTH RINSE
15.0000 mL | Freq: Two times a day (BID) | OROMUCOSAL | Status: DC
  Administered 2017-08-29 – 2017-09-03 (×11): 15 mL via OROMUCOSAL

## 2017-08-29 MED ORDER — ONDANSETRON HCL 4 MG/2ML IJ SOLN: 4.0000 mg | Freq: Once | INTRAMUSCULAR | Status: DC | PRN

## 2017-08-29 MED ORDER — ONDANSETRON HCL 4 MG/2ML IJ SOLN: 4.0000 mg | INTRAMUSCULAR | Status: DC | PRN

## 2017-08-29 MED ORDER — LIDOCAINE-EPINEPHRINE 1 %-1:100000 IJ SOLN
INTRAMUSCULAR | Status: AC
  Filled 2017-08-29: qty 1

## 2017-08-29 MED ORDER — OXYCODONE HCL 5 MG PO TABS: 5.0000 mg | ORAL_TABLET | Freq: Once | ORAL | Status: DC | PRN

## 2017-08-29 MED ORDER — PHENYLEPHRINE 40 MCG/ML (10ML) SYRINGE FOR IV PUSH (FOR BLOOD PRESSURE SUPPORT)
PREFILLED_SYRINGE | INTRAVENOUS | Status: AC
  Filled 2017-08-29: qty 10

## 2017-08-29 MED ORDER — BUPIVACAINE HCL (PF) 0.5 % IJ SOLN
INTRAMUSCULAR | Status: AC
  Filled 2017-08-29: qty 30

## 2017-08-29 MED ORDER — SUGAMMADEX SODIUM 200 MG/2ML IV SOLN
INTRAVENOUS | Status: DC | PRN
  Administered 2017-08-29: 150 mg via INTRAVENOUS

## 2017-08-29 MED ORDER — ROCURONIUM BROMIDE 10 MG/ML (PF) SYRINGE
PREFILLED_SYRINGE | INTRAVENOUS | Status: AC
  Filled 2017-08-29: qty 5

## 2017-08-29 MED ORDER — ONDANSETRON HCL 4 MG PO TABS: 4.0000 mg | ORAL_TABLET | ORAL | Status: DC | PRN

## 2017-08-29 SURGICAL SUPPLY — 86 items
BAG DECANTER FOR FLEXI CONT (MISCELLANEOUS) ×3 IMPLANT
BLADE CLIPPER SURG (BLADE) ×6 IMPLANT
BLADE SURG 11 STRL SS (BLADE) ×6 IMPLANT
BOOT SUTURE AID YELLOW STND (SUTURE) ×3 IMPLANT
BUR PRECISION FLUTE 5.0 (BURR) ×3 IMPLANT
CANISTER SUCT 3000ML PPV (MISCELLANEOUS) ×3 IMPLANT
CARTRIDGE OIL MAESTRO DRILL (MISCELLANEOUS) ×2 IMPLANT
CATH VENTRICULAR 7CM (Shunt) ×3 IMPLANT
CHLORAPREP W/TINT 26ML (MISCELLANEOUS) IMPLANT
CLIP RANEY DISP (INSTRUMENTS) ×3 IMPLANT
CONT SPEC 4OZ CLIKSEAL STRL BL (MISCELLANEOUS) ×3 IMPLANT
DECANTER SPIKE VIAL GLASS SM (MISCELLANEOUS) ×9 IMPLANT
DERMABOND ADVANCED (GAUZE/BANDAGES/DRESSINGS) ×2
DERMABOND ADVANCED .7 DNX12 (GAUZE/BANDAGES/DRESSINGS) ×4 IMPLANT
DIFFUSER DRILL AIR PNEUMATIC (MISCELLANEOUS) ×3 IMPLANT
DRAPE HALF SHEET 40X57 (DRAPES) ×3 IMPLANT
DRAPE INCISE IOBAN 66X45 STRL (DRAPES) ×3 IMPLANT
DRAPE INCISE IOBAN 85X60 (DRAPES) ×3 IMPLANT
DRAPE ORTHO SPLIT 77X108 STRL (DRAPES) ×2
DRAPE POUCH INSTRU U-SHP 10X18 (DRAPES) ×3 IMPLANT
DRAPE SURG ORHT 6 SPLT 77X108 (DRAPES) ×4 IMPLANT
DRAPE UTILITY XL STRL (DRAPES) ×6 IMPLANT
DRSG OPSITE POSTOP 3X4 (GAUZE/BANDAGES/DRESSINGS) ×6 IMPLANT
DURAPREP 26ML APPLICATOR (WOUND CARE) ×6 IMPLANT
ELECT REM PT RETURN 9FT ADLT (ELECTROSURGICAL) ×3
ELECTRODE REM PT RTRN 9FT ADLT (ELECTROSURGICAL) ×2 IMPLANT
GAUZE SPONGE 4X4 16PLY XRAY LF (GAUZE/BANDAGES/DRESSINGS) IMPLANT
GLOVE BIO SURGEON STRL SZ7 (GLOVE) IMPLANT
GLOVE BIO SURGEON STRL SZ7.5 (GLOVE) ×9 IMPLANT
GLOVE BIOGEL PI IND STRL 6.5 (GLOVE) ×2 IMPLANT
GLOVE BIOGEL PI IND STRL 7.0 (GLOVE) IMPLANT
GLOVE BIOGEL PI IND STRL 7.5 (GLOVE) ×6 IMPLANT
GLOVE BIOGEL PI INDICATOR 6.5 (GLOVE) ×1
GLOVE BIOGEL PI INDICATOR 7.0 (GLOVE)
GLOVE BIOGEL PI INDICATOR 7.5 (GLOVE) ×3
GLOVE ECLIPSE 7.0 STRL STRAW (GLOVE) ×6 IMPLANT
GLOVE EXAM NITRILE LRG STRL (GLOVE) IMPLANT
GLOVE EXAM NITRILE XL STR (GLOVE) IMPLANT
GLOVE EXAM NITRILE XS STR PU (GLOVE) IMPLANT
GLOVE INDICATOR 7.5 STRL GRN (GLOVE) ×3 IMPLANT
GLOVE SURG SIGNA 7.5 PF LTX (GLOVE) ×6 IMPLANT
GOWN STRL REUS W/ TWL LRG LVL3 (GOWN DISPOSABLE) ×10 IMPLANT
GOWN STRL REUS W/ TWL XL LVL3 (GOWN DISPOSABLE) ×2 IMPLANT
GOWN STRL REUS W/TWL 2XL LVL3 (GOWN DISPOSABLE) IMPLANT
GOWN STRL REUS W/TWL LRG LVL3 (GOWN DISPOSABLE) ×5
GOWN STRL REUS W/TWL XL LVL3 (GOWN DISPOSABLE) ×1
HEMOSTAT SURGICEL 2X14 (HEMOSTASIS) IMPLANT
KIT BASIN OR (CUSTOM PROCEDURE TRAY) ×3 IMPLANT
KIT TURNOVER KIT B (KITS) ×3 IMPLANT
MARKER SKIN DUAL TIP RULER LAB (MISCELLANEOUS) ×6 IMPLANT
NEEDLE HYPO 25X1 1.5 SAFETY (NEEDLE) ×3 IMPLANT
NS IRRIG 1000ML POUR BTL (IV SOLUTION) ×3 IMPLANT
OIL CARTRIDGE MAESTRO DRILL (MISCELLANEOUS) ×3
PACK LAMINECTOMY NEURO (CUSTOM PROCEDURE TRAY) ×3 IMPLANT
PAD ARMBOARD 7.5X6 YLW CONV (MISCELLANEOUS) ×9 IMPLANT
PASSER CATH 65CM DISP (NEUROSURGERY SUPPLIES) ×3 IMPLANT
PASSER CATH SHUNT 55CM (INSTRUMENTS) ×3 IMPLANT
SCISSORS LAP 5X35 DISP (ENDOMECHANICALS) IMPLANT
SET IRRIG TUBING LAPAROSCOPIC (IRRIGATION / IRRIGATOR) IMPLANT
SHEATH COOK PEEL AWAY SET 9F (SHEATH) ×3 IMPLANT
SHEATH PERITONEAL INTRO 61 (MISCELLANEOUS) ×3 IMPLANT
SHUNT STRATA 11 SNAP REG (Shunt) ×3 IMPLANT
SLEEVE ENDOPATH XCEL 5M (ENDOMECHANICALS) ×3 IMPLANT
SPONGE LAP 4X18 RFD (DISPOSABLE) IMPLANT
SPONGE SURGIFOAM ABS GEL SZ50 (HEMOSTASIS) ×3 IMPLANT
STAPLER SKIN PROX WIDE 3.9 (STAPLE) ×3 IMPLANT
STAPLER VISISTAT (STAPLE) ×3 IMPLANT
STRIP CLOSURE SKIN 1/2X4 (GAUZE/BANDAGES/DRESSINGS) IMPLANT
SUT ETHILON 3 0 PS 1 (SUTURE) IMPLANT
SUT MON AB 4-0 PC3 18 (SUTURE) ×3 IMPLANT
SUT NURALON 4 0 TR CR/8 (SUTURE) IMPLANT
SUT SILK 0 TIES 10X30 (SUTURE) ×3 IMPLANT
SUT SILK 3 0 SH 30 (SUTURE) ×3 IMPLANT
SUT VIC AB 3-0 SH 27 (SUTURE) ×1
SUT VIC AB 3-0 SH 27X BRD (SUTURE) ×2 IMPLANT
SUT VIC AB 3-0 SH 8-18 (SUTURE) ×6 IMPLANT
TOWEL GREEN STERILE (TOWEL DISPOSABLE) ×6 IMPLANT
TOWEL GREEN STERILE FF (TOWEL DISPOSABLE) ×3 IMPLANT
TRAY LAPAROSCOPIC MC (CUSTOM PROCEDURE TRAY) ×3 IMPLANT
TROCAR BLADELESS 5MM (ENDOMECHANICALS) ×3 IMPLANT
TROCAR XCEL BLUNT TIP 100MML (ENDOMECHANICALS) IMPLANT
TROCAR XCEL NON-BLD 5MMX100MML (ENDOMECHANICALS) ×3 IMPLANT
TUBE CONNECTING 12X1/4 (SUCTIONS) ×3 IMPLANT
TUBING INSUFFLATION (TUBING) ×3 IMPLANT
UNDERPAD 30X30 (UNDERPADS AND DIAPERS) ×3 IMPLANT
WATER STERILE IRR 1000ML POUR (IV SOLUTION) ×3 IMPLANT

## 2017-08-29 NOTE — Progress Notes (Addendum)
Inpatient Rehabilitation Admissions Coordinator  I contacted pt's daughter, [name], by phone to again inquire into eventual caregiver support at home, clarification of decision maker as patient is legally married, and clarification of pt's payor as patient newly employed 30 days pta. [name] states she assumed her sister, [name], had clarified insurance and [name] states once again that she will contact pt's husband to request a meeting with me to discuss eventual dispo.   [name] has called back and meeting with [name] and pt's spouse arranged for 12 noon tomorrow to clarify eventual dispo needs. I requested [name] be also contacted to attend. As I will be off tomorrow, Roderic Palau will be covering and will attend meeting. She can be reached at [phone].  Ottie Glazier, RN, MSN Rehab Admissions Coordinator [phone] 08/29/2017 12:15 PM

## 2017-08-29 NOTE — Progress Notes (Signed)
  NEUROSURGERY PROGRESS NOTE   No issues overnight. Pt seen in preop holding, denies any complaints.  EXAM:  BP 130/85   Pulse 81   Temp 98 F (36.7 C) (Oral)   Resp 19   Ht  (1.549 m)   Wt 66.3 kg (146 lb 2.6 oz)   SpO2 100%   BMI 27.62 kg/m   Awake, alert,  Answers simple questions CN grossly intact x right facial droop Good strength LUE/LLE Stable right hemiplegia EVD in place, clamped  IMPRESSION:  62 y.o. 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 Stable neurologically with right hemiplegia.  PLAN: - Proceed with lap-assisted VPS placement with Dr. Magnus Ivan

## 2017-08-29 NOTE — PMR Pre-admission (Signed)
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 I have asked Shanda Bumps to follow up with [name], Main caregiver, not [name].  [phone]  Emergency Contact Information Contact Information    Name Relation Home Work Mobile   Dumond,[name] Daughter [phone]     Eriko, Economos Daughter   [phone]   Iline Oven   [phone]     Current Medical History  Patient Admitting Diagnosis: intraparenchymal hematoma in the left medical temporal lobe  History of Present Illness: Carla Bauer is a 62 year old right-handed female with history of headaches.  Presented 08/14/2017 after being found down by her family with right sided weakness.  Cranial CT scan showed left temporal hemorrhage.  Per report 4.2 x 3.4 x 2.1 intraparenchymal hematoma in the medial left temporal lobe with intraventricular penetration filling of the left lateral ventricle with blood.  Mass-effect with right to left shift of 11 mm.  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  Underwent left craniotomy for clipping of aneurysm, evacuation of left temporal hematoma placement of right frontal external ventricular drain 08/14/2017 per Dr. Conchita Paris.  Hospital course UTI treated with Rocephin.  Renal ultrasound was completed for UTI showing no hydronephrosis.  Diet has been advanced  to a dysphagia #1 nectar thick liquid as well as the nasogastric tube remains in place for nutritional support.  Subcutaneous heparin initiated for DVT prophylaxis 08/19/2017.    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 TCD without evidence of vasospasm. 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 She was monitored without worsening form VP shunt placement. Needs to complete 21 day course of Nimotop. Staples to be removed June 10 th to 13 th.  Total: 20 NIHSS    Past Medical History  History reviewed. No pertinent past medical history.  Family History  family history is not on file.  Prior Rehab/Hospitalizations:  Has the patient had major surgery during 100 days prior to admission? No  Current Medications   Current Facility-Administered Medications:  .  0.9 %  sodium chloride infusion, , Intravenous, Continuous, Beryle Lathe, MD, Last Rate: 10 mL/hr at 09/01/17 1715 .  acetaminophen (TYLENOL) tablet 650 mg, 650 mg, Oral, Q4H PRN **OR** acetaminophen (TYLENOL) solution 650 mg, 650 mg, Per Tube, Q4H PRN, 650 mg at 08/22/17 1749 **OR** acetaminophen (TYLENOL) suppository 650 mg, 650 mg, Rectal, Q4H PRN, Rejeana Brock, MD .  aspirin chewable tablet 81 mg, 81 mg, Oral, Daily, Marvel Plan, MD, 81 mg at 09/03/17 1010 .  ceFAZolin (ANCEF) IVPB 1 g/50 mL premix, 1 g, Intravenous, Q8H, Nundkumar, Neelesh, MD, Last Rate: 100 mL/hr at 09/03/17 1305, 1 g at 09/03/17 1305 .  chlorhexidine (PERIDEX) 0.12 % solution 15 mL, 15 mL, Mouth Rinse, BID, Nundkumar, Neelesh,  MD, 15 mL at 09/03/17 1009 .  feeding supplement (OSMOLITE 1.2 CAL) liquid 1,000 mL, 1,000 mL, Per Tube, Continuous, Agarwala, Ravi, MD, Last Rate: 50 mL/hr at 09/02/17 1759, 1,000 mL at 09/02/17 1759 .  feeding supplement (PRO-STAT SUGAR FREE 64) liquid 30 mL, 30 mL, Per Tube, Daily, Agarwala, Ravi, MD, 30 mL at 09/03/17 1009 .  fentaNYL (SUBLIMAZE) injection 25-50 mcg,  25-50 mcg, Intravenous, Q2H PRN, Whiteheart, Kathryn A, NP, 50 mcg at 09/03/17 0519 .  free water 400 mL, 400 mL, Per Tube, Q6H, Agarwala, Ravi, MD, 400 mL at 09/03/17 1209 .  heparin injection 5,000 Units, 5,000 Units, Subcutaneous, Q8H, Marvel Plan, MD, 5,000 Units at 09/03/17 0518 .  ibuprofen (ADVIL,MOTRIN) 100 MG/5ML suspension 400 mg, 400 mg, Oral, Q6H PRN, Tia Alert, MD, 400 mg at 08/20/17 2018 .  insulin aspart (novoLOG) injection 0-9 Units, 0-9 Units, Subcutaneous, Q4H, Karl Ito, MD, 2 Units at 09/03/17 1305 .  insulin glargine (LANTUS) injection 20 Units, 20 Units, Subcutaneous, QHS, Lynnell Catalan, MD, 20 Units at 09/02/17 2152 .  iopamidol (ISOVUE-370) 76 % injection 50 mL, 50 mL, Intravenous, Once PRN, Agarwala, Ravi, MD .  labetalol (NORMODYNE,TRANDATE) injection 10-40 mg, 10-40 mg, Intravenous, Q10 min PRN, Costella, Vincent J, PA-C .  MEDLINE mouth rinse, 15 mL, Mouth Rinse, q12n4p, Lisbeth Renshaw, MD, 15 mL at 09/03/17 1312 .  metoprolol tartrate (LOPRESSOR) 25 mg/10 mL oral suspension 50 mg, 50 mg, Per Tube, BID, Marvel Plan, MD, 50 mg at 09/03/17 1009 .  niMODipine (NYMALIZE) 60 MG/20ML oral solution 60 mg, 60 mg, Per Tube, Q4H, Lisbeth Renshaw, MD, 60 mg at 09/03/17 1010 .  ondansetron (ZOFRAN) tablet 4 mg, 4 mg, Oral, Q4H PRN **OR** ondansetron (ZOFRAN) injection 4 mg, 4 mg, Intravenous, Q4H PRN, Costella, Vincent J, PA-C .  promethazine (PHENERGAN) tablet 12.5-25 mg, 12.5-25 mg, Oral, Q4H PRN, Costella, Vincent J, PA-C .  psyllium (HYDROCIL/METAMUCIL) packet 1 packet, 1 packet, Oral, BID, Lynnell Catalan, MD, 1 packet at 09/03/17 1009 .  RESOURCE THICKENUP CLEAR, , Oral, PRN, Lisbeth Renshaw, MD  Patients Current Diet:   MBS 6/3 with diet upgrade to Dysphagia 1 nectar thick liquids. Meds crushed with puree.  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 None of the above  Prior Functional Level Prior Function Level of Independence: Independent  Self Care: Did the patient need help bathing, dressing, using the toilet or eating?  Independent  Indoor Mobility: Did the patient need assistance with walking from room to room (with or without device)? Independent  Stairs: Did the patient need assistance with internal or external stairs (with or without device)? Independent  Functional Cognition: Did the patient need help planning regular tasks such as shopping or remembering to take medications? Independent  Current Functional Level Cognition  Arousal/Alertness: Awake/alert Overall Cognitive Status: Impaired/Different from baseline Current Attention Level: Focused Orientation Level: Oriented to person, Disoriented to place, Disoriented to time, Disoriented to situation Following Commands: Follows one step commands inconsistently, Follows one step commands with increased time Safety/Judgement: Decreased awareness of safety, Decreased awareness of deficits General Comments: improved verablizations  during todays session - likely due to daughter being in room. Continues to focus gaze towards L side Attention: Focused Focused Attention: Impaired Focused Attention Impairment: Verbal basic, Functional basic    Extremity Assessment (includes Sensation/Coordination)  Upper Extremity Assessment: RUE deficits/detail, LUE deficits/detail RUE Deficits / Details: R UE brunstrom I RUE Sensation: decreased light touch, decreased proprioception RUE Coordination: decreased fine motor, decreased gross motor LUE Deficits / Details: actively doing thumb  up and attempting thumbs down but only turning hand sideways. pt attempting "okay"sign but unable to form. pt with grasp 3 out 5  LUE Coordination: decreased fine motor, decreased gross motor  Lower Extremity Assessment: Defer to PT evaluation RLE Deficits / Details: As she became more alert, pt spontaneously moved left LE around.  She was able to bear full weight during standing. LLE Deficits / Details: associated movements only to cough otherwise PROM was stiff LLE Coordination: decreased fine motor, decreased gross motor    ADLs  Overall ADL's : Needs assistance/impaired Eating/Feeding: NPO Grooming: Wash/dry hands, Wash/dry face, Cueing for sequencing Grooming Details (indicate cue type and reason): total hand over hand assistance Upper Body Bathing: Total assistance Lower Body Bathing: Total assistance Upper Body Dressing : Total assistance Lower Body Dressing: Total assistance Toilet Transfer: Moderate assistance, +2 for physical assistance, +2 for safety/equipment, Stand-pivot Toilet Transfer Details (indicate cue type and reason): simulated through bed to chair transfer Toileting- Clothing Manipulation and Hygiene: Total assistance, Sit to/from stand Functional mobility during ADLs: Moderate assistance, +2 for physical assistance, +2 for safety/equipment, Cueing for sequencing General ADL Comments: pt transfer from bed to chair simualted toilet transfer.     Mobility  Overal bed mobility: Needs Assistance Bed Mobility: Supine to Sit Rolling: Max assist, +2 for physical assistance Sidelying to sit: Max assist Supine to sit: Max assist, +2 for physical assistance Sit to supine: +2 for physical assistance, Max assist General bed mobility comments: improved initiation to take L LE towards EOB with verbal cueing. Able to sit EOB x 5 min with Min A to SUP with improved trunk control and upright posturing    Transfers  Overall transfer level: Needs assistance Equipment used: 2  person hand held assist Transfer via Lift Equipment: Stedy Transfers: Sit to/from Stand, Pharmacologist Sit to Stand: +2 physical assistance, Mod assist Stand pivot transfers: Max assist, +2 physical assistance Squat pivot transfers: Max assist, +2 physical assistance General transfer comment: improved participation with sit to stand at bedside today with more upright posture, however continued rounded shoulders and forward neck flexion    Ambulation / Gait / Stairs / Wheelchair Mobility  Ambulation/Gait General Gait Details: NT    Posture / Balance Dynamic Sitting Balance Sitting balance - Comments: Min A to SUP while seated EOB for short durations Balance Overall balance assessment: Needs assistance Sitting-balance support: Single extremity supported, Feet supported Sitting balance-Leahy Scale: Poor Sitting balance - Comments: Min A to SUP while seated EOB for short durations Postural control: Right lateral lean Standing balance support: Single extremity supported, Bilateral upper extremity supported Standing balance-Leahy Scale: Poor Standing balance comment: x2 stand with PT (and tech). Improved forward lean and upright posture. Heavy verbal cueing to lift head for forward gaze    Special needs/care consideration BiPAP/CPAP n/a CPM  N/a Continuous Drip IV  N/a Dialysis n/a Life Vest  N/a Oxygen  N/a Special Bed  N/a Trach Size  N/a Wound Vac n/a Skin surgical incision with staples  Bowel mgmt: incontinent  LBM  6/3 Bladder mgmt: incontinent; indwelling catheter 6/1  Diabetic mgmt  N/a Cortrak placed 08/16/17   Previous Home Environment Living Arrangements: (lived with daughter, [name] pta)  Lives With: Daughter([name]) Available Help at Discharge: Family, Available 24 hours/day(daughter, [name], requests pt to live with her) Type of Home: House Home Layout: One level Home Access: Level entry Bathroom Shower/Tub: Tub/shower unit,  Engineer, building services: Standard Bathroom Accessibility: Yes How Accessible: Accessible via walker Home Care Services: No Additional Comments: Pt lives with daughter who works 7-3, and her children who are young.  daughter reports that pt will have family or friends assist her at discharge while she is at work, but isn't able to state exactly who that is.  The other daughter became tearful and stated "I need to have a say in what happens... she's my mother, and I am the oldest" Patient lived with [place], but [name] wants pt to move in with her. Daughters do not agree. Patient is separated from spouse for 1 year, not legal separation. [name], daughter does not have place to live for she and her Mom. I have told both daughters that pt chose to share housing with [name] pta and was separated from her spouse. I therefore assume that patient will return to live at [facility] and pt's home at d/c so daughters need to pull together the plan to have family caregivers at that home.    latoya is main caregiver and contact but works days. Easily reachable by phone. [name] is at bedside during the day, but is not caregiver.  Discharge Living Setting Plans for Discharge Living Setting: [name] home, Lives with (comment)([name] and patient live together) Type of Home at Discharge: House Discharge Home Layout: One level Discharge Home Access: Level entry Discharge Bathroom Shower/Tub: Tub/shower unit, Curtain Discharge Bathroom Toilet: Standard Discharge Bathroom Accessibility: Yes How Accessible: Accessible via walker Does the patient have any problems obtaining your medications?: Yes (Describe)  Social/Family/Support Systems Patient Roles: Spouse, Parent(CNA) Contact Information: Lewis Shock, daughters Anticipated Caregiver: family, mainly [place] Anticipated Industrial/product designer Information: see above Ability/Limitations of Caregiver: [name] works first shift, Government social research officer, unemployed Engineer, structural  Availability: 24/7 Discharge Plan Discussed with Primary Caregiver: Yes Is Caregiver In Agreement with Plan?: Yes Does Caregiver/Family have Issues with Lodging/Transportation while Pt is in Rehab?: No  Goals/Additional Needs Patient/Family Goal for Rehab: Min assist with PT, OT, and SLP Expected length of stay: ELOS 22 to 27 days Special Service Needs: Patient seperated; not legally. Chiquita Loth Pt/Family Agrees to Admission and willing to participate: Yes Program Orientation Provided & Reviewed with Pt/Caregiver Including Roles  & Responsibilities: Yes  Decrease burden of Care through IP rehab admission: n/a  Possible need for SNF placement upon discharge:not anticipated. If SNF needed, acute SW had begun [facility] and [facility] was working on [employer] for placement locally or [employer].  Patient Condition: This patient's medical and functional status has changed since the consult dated: 08/20/2017 in which the Rehabilitation Physician determined and documented that the patient's condition is appropriate for intensive rehabilitative care in an inpatient rehabilitation facility. See "History of Present Illness" (above) for medical update. Functional changes are: overall max to total assist. [name] medical and functional status update has been discussed with the Rehabilitation physician and patient remains appropriate for inpatient rehabilitation. Will admit to inpatient rehab today.  Preadmission Screen Completed By:  Ottie Glazier, RN MSN with updates by  Clois Dupes, 09/03/2017 1:13 PM ______________________________________________________________________   Discussed status  with Dr. Riley Kill on 09/03/2017 at  1312 and received telephone approval for admission today.  Admission Coordinator:  Clois Dupes, time 1610 Date 09/03/2017

## 2017-08-29 NOTE — Anesthesia Procedure Notes (Signed)
Procedure Name: Intubation Date/Time: 08/29/2017 3:25 PM Performed by: Shirlyn Goltz, CRNA Pre-anesthesia Checklist: Patient identified, Emergency Drugs available, Suction available and Patient being monitored Patient Re-evaluated:Patient Re-evaluated prior to induction Oxygen Delivery Method: Circle system utilized Preoxygenation: Pre-oxygenation with 100% oxygen Induction Type: IV induction Ventilation: Mask ventilation without difficulty and Oral airway inserted - appropriate to patient size Laryngoscope Size: Mac and 3 Grade View: Grade III Tube type: Oral Tube size: 7.0 mm Number of attempts: 1 Airway Equipment and Method: Stylet Placement Confirmation: ETT inserted through vocal cords under direct vision,  positive ETCO2 and breath sounds checked- equal and bilateral Secured at: 18 cm Tube secured with: Tape Dental Injury: Teeth and Oropharynx as per pre-operative assessment

## 2017-08-29 NOTE — Anesthesia Preprocedure Evaluation (Addendum)
Anesthesia Evaluation  Patient identified by MRN, date of birth, ID band Patient confused    Reviewed: Allergy & Precautions, NPO status , [name] Chart, lab work & pertinent test results  Airway Mallampati: III  TM Distance: >3 FB Neck ROM: Full    Dental  (+) Dental Advisory Given   Pulmonary neg pulmonary ROS,    breath sounds clear to auscultation       Cardiovascular hypertension, + Peripheral Vascular Disease   Rhythm:Regular Rate:Normal     Neuro/Psych Hx SAH from PCom aneurysm with hydrocephalus and brain herniation, s/p clipping CVA, Residual Symptoms negative psych ROS   GI/Hepatic negative GI ROS, Neg liver ROS,   Endo/Other  negative endocrine ROS  Renal/GU negative Renal ROS  negative genitourinary   Musculoskeletal negative musculoskeletal ROS (+)   Abdominal   Peds  Hematology  (+) anemia ,   Anesthesia Other Findings   Reproductive/Obstetrics                           Anesthesia Physical Anesthesia Plan  ASA: III  Anesthesia Plan: General   Post-op Pain Management:    Induction: Intravenous  PONV Risk Score and Plan: 3 and Treatment may vary due to age or medical condition, Ondansetron and Dexamethasone  Airway Management Planned: Oral ETT  Additional Equipment: None  Intra-op Plan:   Post-operative Plan: Extubation in OR  Informed Consent: I have reviewed the patients History and Physical, chart, labs and discussed the procedure including the risks, benefits and alternatives for the proposed anesthesia with the patient or authorized representative who has indicated his/her understanding and acceptance.   Dental advisory given and Consent reviewed with POA  Plan Discussed with: CRNA and Anesthesiologist  Anesthesia Plan Comments:        Anesthesia Quick Evaluation

## 2017-08-29 NOTE — Progress Notes (Addendum)
Patient ID: Carla Bauer, female   DOB: [birth year], 62 y.o.   MRN: [id number]  Plan assisting Dr. Kathyrn Sheriff with the VP shunt insertion today via a laparoscopic approach for the abdominal part.  Will discuss risks with patient and family   Addendum: I have met the patient in the preop holding area along with her family.  I discussed the risks of the procedure which includes but is not limited to bleeding, infection, injury to surrounding structures, etc.  They agreed to proceed

## 2017-08-29 NOTE — Op Note (Signed)
SHUNT INSERTION VENTRICULAR-PERITONEAL  Procedure Note  Carla Bauer 08/14/2017 - 08/29/2017   Pre-op Diagnosis: ANEURYSM     Post-op Diagnosis: same  Procedure(s): SHUNT INSERTION VENTRICULAR-PERITONEAL LAPAROSCOPIC REVISION VENTRICULAR-PERITONEAL (V-P) SHUNT  Surgeon(s): Lisbeth Renshaw, MD Abigail Miyamoto, MD  Anesthesia: General  Staff:  Circulator: Maureen Ralphs, RN; Carollee Leitz, RN Physician Assistant: Alyson Ingles, PA-C Relief Scrub: Jari Pigg, Natalia Scrub Person: Janell Quiet  Estimated Blood Loss: Minimal               Procedure: The patient was identified as the correct patient.  She was placed supine on the operating table and general anesthesia was induced.  Her head, neck, chest, and abdomen were then prepped and draped in usual sterile fashion. As Dr. Conchita Paris was preparing the cranial portion of the procedure, I began to gain access into the abdominal cavity.  I made a small incision in the patient's left upper quadrant with a scalpel.  I then used a 5 mm trocar and Optiview camera to slowly traversed all layers of the abdominal wall under direct vision and gain entrance into the peritoneal cavity.  Insufflation of the abdomen was begun.  I evaluated the introduction site and saw no evidence of bowel injury.  I placed another 5 mm trocar in the patient's left mid abdomen under direct vision after incision with a scalpel.  Once Dr.Nundkumar had brought the catheter through the subtenons tissue and out of a small incision on the abdominal wall, I then used an introducer sheath to go through the incision and into the peritoneal cavity under direct vision.  The catheter was then fed down the introducer sheath and into the abdominal cavity.  The sheath was then peeled away leaving the catheter in the abdomen.  I positioned down into the pelvis.  It was tested with saline and found to be patent.  At this point I was then able to remove all  trochars and deflated the abdomen.  The abdominal incisions were closed with 4-0 Monocryl sutures and Dermabond.  Hemostasis appeared to be achieved.  All counts were correct for the abdominal portion of the procedure.  The neurosurgical portion of the procedure will be dictated by Dr. Conchita Paris.          Decorey Wahlert A   Date: 08/29/2017  Time: 4:18 PM

## 2017-08-29 NOTE — Op Note (Signed)
PREOP DIAGNOSIS:  1. Non-obstructive hydrocephalus 2. Subarachnoid hemorrhage  POSTOP DIAGNOSIS: Same  PROCEDURE: 1. Laparoscopic assisted right ventriculoperitoneal shunt placement  SURGEON: Dr. Lisbeth Renshaw, MD  CO-SURGEON: Dr. Abigail Miyamoto, MD  ANESTHESIA: General Endotracheal  EBL: 50cc  SPECIMENS: None  DRAINS: None  COMPLICATIONS: None immediate  CONDITION: Stable to ICU  SHUNT PLACED: Med[employer]onic Strata II, set to 1.5  HISTORY: Carla Bauer is a 62 y.o. female initially presenting to the emergency department with loss of consciousness after sudden onset of headache.  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  Ventricular catheter was placed during surgical clipping.  She has since been observed in the intensive care unit.  Multiple attempts have been made to wean her ventricular catheter unsuccessfully.  She therefore presents today for elective placement of right ventricular peritoneal shunt.  The indications, risks, and benefits of the surgery were explained in detail to the patient and her daughters.  After all questions were answered informed consent was obtained and witnessed.  PROCEDURE IN DETAIL: The patient was brought to the operating room and transferred to the operative table. After induction of general anesthesia, the patient was positioned on the operative table in the supine position with all pressure points meticulously padded. The skin of the scalp,  neck, chest, and abdomen were prepped in the usual sterile fashion.  The previously made right frontal scalp incision was opened sharply, and the external ventricular drain was identified.  A tunnel was then created using a hemostat subcutaneously to the retroauricular region.  Counter incision was made behind the ear, and the distal shunt apparatus was then passed from a distal to proximal fashion, and the  valve was seated subcutaneously.  The shunt passer was then used to tunnel from the retroauricular incision into the right upper quadrant with a small counter incision made in the infraclavicular region.  The previously placed external ventricular drain was then clamped and removed, and using standard anatomic landmarks a 7 cm ventricular catheter was then placed in the previously placed bur hole.  Good clear CSF flow was obtained. The catheter was then connected to the valve apparatus.    The distal portion of the shunt was then placed in the intraperitoneal space within the pelvis under laparoscopic guidance by Dr. Magnus Ivan.  The details of this procedure are dictated in a separate report.  The valve was then pumped and good CSF flow was observed from the distal portion of the catheter within the pelvis.  At this point the scalp wounds were irrigated with copious amounts of bacitracin irrigation, and closed using 3-0 Vicryl stitches.  The skin was then closed using standard surgical skin staples.  Sterile dressings were then applied.  At the end of the case all sponge needle and instrument counts were correct.  The distal portion of the external ventricular drain was then removed and the tunnelled exit site was closed using skin staples.  The patient tolerated the procedure well and was extubated in the room and taken to the postanesthesia care unit in stable condition.

## 2017-08-29 NOTE — Progress Notes (Signed)
Physical Therapy Treatment Patient Details Name: Carla Bauer MRN: [id number] DOB: 1955-07-03 Today's Date: 08/29/2017    History of Present Illness pt is a 62 y/o female with no significant medical history admitted with R sided weakness.  Pt s/p L crani for aneurysm clipping, evacuation of left temporal hematoma and placement of right frontal ventricular drain.  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  Emphasis also on standing trials, but unable to get OOB to chair due to pt heading to surgery for VP shunting.   Follow Up Recommendations  CIR     Equipment Recommendations  Other (comment)(TBA)    Recommendations for Other Services Rehab consult     Precautions / Restrictions Precautions Precautions: Fall Precaution Comments: watch for EVD clamped     Mobility  Bed Mobility Overal bed mobility: Needs Assistance Bed Mobility: Sit to Supine;Rolling;Sidelying to Sit Rolling: Mod assist Sidelying to sit: Max assist   Sit to supine: +2 for physical assistance;Max assist   General bed mobility comments: pt needed facilitation of right leg and UE to prep for rolling Left and mod assist then significant assist to help R LE off bed and assist trunk up via L elbow  Transfers Overall transfer level: Needs assistance Equipment used: 2 person hand held assist(chair back ) Transfers: Sit to/from Stand Sit to Stand: Max assist;+2 physical assistance         General transfer comment: Facilitation trunk then hips forward then into extention, significant support at R LE for upright standing with L UE support from the chair.  Ambulation/Gait                 Stairs             Wheelchair Mobility    Modified Rankin (Stroke Patients Only) Modified Rankin (Stroke Patients  Only) Pre-Morbid Rankin Score: No symptoms Modified Rankin: Severe disability     Balance Overall balance assessment: Needs assistance Sitting-balance support: Single extremity supported;Feet supported Sitting balance-Leahy Scale: Poor Sitting balance - Comments: needed external support and L UE   Standing balance support: Single extremity supported;During functional activity Standing balance-Leahy Scale: Poor Standing balance comment: 2 standing trials with assist of 2 persons and significant support on R side.                            Cognition Arousal/Alertness: Awake/alert Behavior During Therapy: Flat affect;WFL for tasks assessed/performed Overall Cognitive Status: Impaired/Different from baseline Area of Impairment: Following commands                   Current Attention Level: Focused   Following Commands: Follows one step commands with increased time;Follows one step commands inconsistently     Problem Solving: Slow processing        Exercises      General Comments General comments (skin integrity, edema, etc.): vss      Pertinent Vitals/Pain Pain Assessment: Faces Faces Pain Scale: Hurts little more Pain Location: L leg Pain Descriptors / Indicators: Grimacing Pain Intervention(s): Monitored during session    Home Living                      Prior Function            PT  Goals (current goals can now be found in the care plan section) Acute Rehab PT Goals Patient Stated Goal: pt unable to state.  pt's children interested in rehab. PT Goal Formulation: With patient/family Time For Goal Achievement: 09/02/17 Potential to Achieve Goals: Fair Progress towards PT goals: Progressing toward goals    Frequency    Min 3X/week      PT Plan Current plan remains appropriate    Co-evaluation              AM-PAC PT "6 Clicks" Daily Activity  Outcome Measure  Difficulty turning over in bed (including adjusting  bedclothes, sheets and blankets)?: Unable Difficulty moving from lying on back to sitting on the side of the bed? : Unable Difficulty sitting down on and standing up from a chair with arms (e.g., wheelchair, bedside commode, etc,.)?: Unable Help needed moving to and from a bed to chair (including a wheelchair)?: Total Help needed walking in hospital room?: Total Help needed climbing 3-5 steps with a railing? : Total 6 Click Score: 6    End of Session   Activity Tolerance: Patient limited by fatigue Patient left: in bed;with call bell/phone within reach;with bed alarm set;with SCD's reapplied Nurse Communication: Mobility status PT Visit Diagnosis: Hemiplegia and hemiparesis Hemiplegia - Right/Left: Right Hemiplegia - dominant/non-dominant: Dominant Hemiplegia - caused by: Cerebral infarction     Time: 1210-1240 PT Time Calculation (min) (ACUTE ONLY): 30 min  Charges:  $Therapeutic Activity: 23-37 mins                    G Codes:       2017/09/25  Atalissa Bing, PT [phone]  (pager)   Eliseo Gum Annaclaire Walsworth 25-Sep-2017, 6:12 PM

## 2017-08-29 NOTE — Transfer of Care (Signed)
Immediate Anesthesia Transfer of Care Note  Patient: Jamell Opfer Lopezgarcia  Procedure(s) Performed: SHUNT INSERTION VENTRICULAR-PERITONEAL (Right ) LAPAROSCOPIC REVISION VENTRICULAR-PERITONEAL (V-P) SHUNT (N/A )  Patient Location: PACU  Anesthesia Type:General  Level of Consciousness: awake - no verbalization but patient is responsive to baseline in preop  Airway & Oxygen Therapy: Patient Spontanous Breathing and Patient connected to nasal cannula oxygen  Post-op Assessment: Report given to RN and Post -op Vital signs reviewed and stable moving left side appropriately   Post vital signs: Reviewed and stable  Last Vitals:  Vitals Value Taken Time  BP 150/100 08/29/2017  4:50 PM  Temp    Pulse 72 08/29/2017  4:54 PM  Resp 10 08/29/2017  4:54 PM  SpO2 100 % 08/29/2017  4:54 PM  Vitals shown include unvalidated device data.  Last Pain:  Vitals:   08/29/17 1200  TempSrc: Oral  PainSc:          Complications: No apparent anesthesia complications

## 2017-08-29 NOTE — Anesthesia Postprocedure Evaluation (Signed)
Anesthesia Post Note  Patient: Carla Bauer  Procedure(s) Performed: SHUNT INSERTION VENTRICULAR-PERITONEAL (Right ) LAPAROSCOPIC REVISION VENTRICULAR-PERITONEAL (V-P) SHUNT (N/A )     Patient location during evaluation: PACU Anesthesia Type: General Level of consciousness: awake Pain management: pain level controlled Vital Signs Assessment: post-procedure vital signs reviewed and stable Respiratory status: spontaneous breathing, nonlabored ventilation, respiratory function stable and patient connected to nasal cannula oxygen Cardiovascular status: blood pressure returned to baseline and stable Postop Assessment: no apparent nausea or vomiting Anesthetic complications: no    Last Vitals:  Vitals:   08/29/17 1900 08/29/17 1951  BP: 132/84   Pulse: 83   Resp: (!) 8   Temp:  36.4 C  SpO2: 99%     Last Pain:  Vitals:   08/29/17 1951  TempSrc: Oral  PainSc:                  Beryle Lathe

## 2017-08-30 ENCOUNTER — Encounter (HOSPITAL_COMMUNITY): Payer: Self-pay | Admitting: Neurosurgery

## 2017-08-30 ENCOUNTER — Other Ambulatory Visit (HOSPITAL_COMMUNITY): Payer: Self-pay

## 2017-08-30 LAB — GLUCOSE, CAPILLARY
GLUCOSE-CAPILLARY: 185 mg/dL — AB (ref 65–99)
Glucose-Capillary: 155 mg/dL — ABNORMAL HIGH (ref 65–99)
Glucose-Capillary: 131 mg/dL — ABNORMAL HIGH (ref 65–99)
Glucose-Capillary: 151 mg/dL — ABNORMAL HIGH (ref 65–99)
Glucose-Capillary: 134 mg/dL — ABNORMAL HIGH (ref 65–99)

## 2017-08-30 NOTE — Care Management Note (Signed)
Case Management Note  Patient Details  Name: Carla Bauer MRN: [id number] Date of Birth: September 24, 1955  Subjective/Objective:  Pt admitted on 08/14/17 s/p Lt temporal lobe ICH with IVH/SAH/SDH duet to LT PCOM aneurysm rupture s/p clipping, EVD and hematoma evacuation.  PTA, pt was independent, lives at home with daughter; still working PTA.              Action/Plan: Pt extubated today.  Await therapy recommendations.      Expected Discharge Date:                  Expected Discharge Plan:     In-House Referral:     Discharge planning Services  CM Consult  Post Acute Care Choice:    Choice offered to:     DME Arranged:    DME Agency:     HH Arranged:    HH Agency:     Status of Service:  In process, will continue to follow  If discussed at Long Length of Stay Meetings, dates discussed:    Additional Comments:  08/30/17 J. Tavarius Grewe, RN, BSN Noted rehab admissions note regarding interaction with family.  Pt currently remains on Nimotop therapy; appears today is today 15 of therapy.  Will await CIR follow up on Monday; uncertain of family support at this time, per note.  May end up needing SNF.   Glennon Mac, RN 08/30/2017, 4:34 PM

## 2017-08-30 NOTE — Progress Notes (Signed)
  Speech Language Pathology Treatment: Dysphagia  Patient Details Name: Carla Bauer MRN: [id number] DOB: 1955-12-02 Today's Date: 08/30/2017 Time: 4742-5956 SLP Time Calculation (min) (ACUTE ONLY): 14 min  Assessment / Plan / Recommendation Clinical Impression  ST follow up for PO readiness.  Of note the patient had surgery yesterday.  When ST entered room she was alert but not responsive to her name and no verbalizations were heard.  The patient was resistant to oral care but did allow her teeth to be brushed using suction toothbrush only on the outside.  She would not unclamp her teeth to allow for cleaning on the back of her teeth or tongue.  She did not follow any commands.  This is a marked change compared to what SLP documented about patient behavior on Monday and Tuesday of this week.  The patient presented with an oral and pharyngeal dysphagia characterized by decreased labial seal with anterior escape, delayed oral transit, decreased bolus awareness, and suspected delayed swallow trigger.  Hyo-laryngeal movement felt weak.  Immediate weak cough observed after thin liquids via spoon sips.  Recommend that the patient remain NPO.  ST will continue to follow up to assess for PO readiness in the hopes that her functional status will improve and she will be ready for instrumental exam.       HPI HPI: Carla Bauer is a 62 y.o. female with no significant history found having fallen out of the bed with R arm weakness and altered mental status. 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 Intubated ETT 5/15- 5/17.      SLP Plan  Continue with current plan of care       Recommendations  Diet recommendations: NPO Medication Administration: Via alternative means                General recommendations: Rehab consult Oral Care Recommendations: Oral care QID Follow up Recommendations: Inpatient Rehab SLP Visit  Diagnosis: Dysphagia, oropharyngeal phase (R13.12) Plan: Continue with current plan of care       GO               Dimas Aguas, MA, CCC-SLP Acute Rehab SLP [phone]  Carla Bauer 08/30/2017, 1:10 PM

## 2017-08-30 NOTE — Progress Notes (Signed)
Rehab admissions - Daughter Ronny Bacon and patient's husband did not show up today for 12 noon appointment.  Daughter Phineas Real and her uncle came to visit about 1 pm today.  I met with [name].  Patient lives with Phineas Real and daughter works 7 a to 3 pm daily.  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  Phineas Real says that daughter Ronny Bacon has no place to live at present.  I spoke with patient's husband, Toni Amend.  He wants to come see his wife today but currently had no transportation.  He will decide over the weekend if he is going to manage for his wife or if he will let daughters manage for the patient.  My partner will follow up on Monday for plans.  # [phone]

## 2017-08-30 NOTE — Progress Notes (Signed)
Physical Therapy Treatment Patient Details Name: Carla Bauer MRN: [id number] DOB: 1956/03/25 Today's Date: 08/30/2017    History of Present Illness pt is a 62 y/o female with no significant medical history admitted with R sided weakness.  Pt s/p L crani for aneurysm clipping, evacuation of left temporal hematoma and placement of right frontal ventricular drain.  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  Emphasis today on scooting to edge of the chair and standing activity.    Follow Up Recommendations  CIR     Equipment Recommendations  Other (comment)    Recommendations for Other Services Rehab consult     Precautions / Restrictions      Mobility  Bed Mobility                  Transfers Overall transfer level: Needs assistance Equipment used: 2 person hand held assist;Rolling walker (2 wheeled) Transfers: Sit to/from Stand Sit to Stand: Max assist;+2 physical assistance         General transfer comment: Facilitation trunk then hips forward then into extention, significant support at R LE for upright standing with L UE support from the chair.  Ambulation/Gait                 Stairs             Wheelchair Mobility    Modified Rankin (Stroke Patients Only) Modified Rankin (Stroke Patients Only) Pre-Morbid Rankin Score: No symptoms Modified Rankin: Severe disability     Balance Overall balance assessment: Needs assistance Sitting-balance support: Single extremity supported Sitting balance-Leahy Scale: Poor Sitting balance - Comments: needed external support and L UE Postural control: Right lateral lean Standing balance support: Single extremity supported;Bilateral upper  extremity supported Standing balance-Leahy Scale: Poor Standing balance comment: 2 standing trials working to attain full upright positioning, bringing R pelvis forward and support at right LE.                            Cognition Arousal/Alertness: Awake/alert Behavior During Therapy: Flat affect;WFL for tasks assessed/performed(some smiles) Overall Cognitive Status: Impaired/Different from baseline                     Current Attention Level: Focused   Following Commands: Follows one step commands with increased time   Awareness: Intellectual Problem Solving: Slow processing General Comments: Pt with minimal verbalizations      Exercises      General Comments General comments (skin integrity, edema, etc.): VSS      Pertinent Vitals/Pain Pain Assessment: Faces Faces Pain Scale: No hurt    Home Living                      Prior Function            PT Goals (current goals can now be found in the care plan section) Acute Rehab PT Goals Patient Stated Goal: pt unable to state.   PT Goal Formulation: With patient/family Time For Goal Achievement: 09/02/17 Potential to Achieve Goals: Fair Progress towards PT goals: Progressing toward goals    Frequency    Min 3X/week      PT Plan  Current plan remains appropriate    Co-evaluation              AM-PAC PT "6 Clicks" Daily Activity  Outcome Measure  Difficulty turning over in bed (including adjusting bedclothes, sheets and blankets)?: Unable Difficulty moving from lying on back to sitting on the side of the bed? : Unable Difficulty sitting down on and standing up from a chair with arms (e.g., wheelchair, bedside commode, etc,.)?: Unable Help needed moving to and from a bed to chair (including a wheelchair)?: Total Help needed walking in hospital room?: Total Help needed climbing 3-5 steps with a railing? : Total 6 Click Score: 6    End of Session   Activity Tolerance:  Patient limited by fatigue Patient left: in chair;with call bell/phone within reach;with family/visitor present Nurse Communication: Mobility status PT Visit Diagnosis: Hemiplegia and hemiparesis Hemiplegia - Right/Left: Right Hemiplegia - dominant/non-dominant: Dominant Hemiplegia - caused by: Cerebral infarction     Time: 1344-1411 PT Time Calculation (min) (ACUTE ONLY): 27 min  Charges:  $Therapeutic Activity: 23-37 mins                    G Codes:       September 07, 2017  [place] Bing, PT [phone]  (pager)   Carla Bauer September 07, 2017, 5:34 PM

## 2017-08-30 NOTE — Progress Notes (Signed)
  NEUROSURGERY PROGRESS NOTE   No issues overnight. Working with PT/OT this am.  EXAM:  BP 102/73   Pulse 89   Temp 98.2 F (36.8 C) (Oral)   Resp 17   Ht  (1.549 m)   Wt 66.7 kg (147 lb 0.8 oz)   SpO2 100%   BMI 27.78 kg/m   Awake, alert  Was speaking to RN briefly Moves LUE/LLE well Right hemiplegia Wounds c/d/i  IMPRESSION:  62 y.o. female s/p SAH, POD#1 right VPS. At baseline  PLAN: - SLP eval today, will need to decide about PEG v PO feeding during recovery

## 2017-08-30 NOTE — Progress Notes (Signed)
Patient ID: Carla RightDorothy A Provencal, female   DOB: 08-Sep-1955, [age] y.o.   MRN: [id number]  No issues over night Tolerating tube feeds Abdomen soft, incisions clean  Will see PRN

## 2017-08-30 NOTE — Progress Notes (Signed)
Occupational Therapy Treatment Patient Details Name: Carla Bauer MRN: [id number] DOB: [birth year] Today's Date: 08/30/2017    History of present illness pt is a 62 y/o female with no significant medical history admitted with R sided weakness.  Pt s/p L crani for aneurysm clipping, evacuation of left temporal hematoma and placement of right frontal ventricular drain.  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 Mod A +2 for sit <>stand and max A +2 for pivot to recliner on the left. While seated in chair PROM for RUE at digits, wrist, elbow, and shoulder. Pt able to bring gaze to midline during PROM of RUE, no indication of sensation on right side during session. Pt trying to vocalize, but unintelligible. However, using L hand to make thumbs up/down(sideways) to questions with processing delay. OT will continue to follow acutely, CIR remains essential and appropriate to maximize safety and independence in ADL, functional transfers.  Follow Up Recommendations  CIR;Supervision/Assistance - 24 hour    Equipment Recommendations  None recommended by OT    Recommendations for Other Services Rehab consult    Precautions / Restrictions Precautions Precautions: Fall Restrictions Weight Bearing Restrictions: No       Mobility Bed Mobility Overal bed mobility: Needs Assistance Bed Mobility: Sit to Supine;Rolling;Sidelying to Sit Rolling: Mod assist Sidelying to sit: Max assist       General bed mobility comments: pt needed facilitation of right leg and UE to prep for rolling Left and mod assist then significant assist to help R LE off bed and assist trunk up via L elbow  Transfers Overall transfer level: Needs assistance Equipment used: 2 person hand held assist(chair back ) Transfers: Sit to/from Stand Sit to Stand: Max assist;+2 physical  assistance         General transfer comment: Facilitation trunk then hips forward then into extention, significant support at R LE for upright standing with L UE support from the chair.    Balance Overall balance assessment: Needs assistance Sitting-balance support: Single extremity supported;Feet supported Sitting balance-Leahy Scale: Poor Sitting balance - Comments: needed external support and L UE Postural control: Right lateral lean Standing balance support: Single extremity supported;During functional activity Standing balance-Leahy Scale: Poor Standing balance comment: heavier support on R side, rocking to assist with sit <> stand                           ADL either performed or assessed with clinical judgement   ADL Overall ADL's : Needs assistance/impaired Eating/Feeding: NPO   Grooming: Wash/dry face;Maximal assistance Grooming Details (indicate cue type and reason): Pt with crust from secretions on face, HOH max A with warm wash cloth                 Toilet Transfer: Moderate assistance;+2 for physical assistance;+2 for safety/equipment;Stand-pivot Toilet Transfer Details (indicate cue type and reason): simulated through bed to chair transfer         Functional mobility during ADLs: Moderate assistance;+2 for physical assistance;+2 for safety/equipment;Cueing for sequencing General ADL Comments: pt transfer from bed to chair simualted toilet transfer.      Vision       Perception     Praxis      Cognition Arousal/Alertness: Awake/alert Behavior During Therapy: Flat affect;WFL for tasks assessed/performed Overall Cognitive Status: Impaired/Different from baseline Area of Impairment: Following  commands;Awareness;Attention;Problem solving                   Current Attention Level: Focused   Following Commands: Follows one step commands with increased time;Follows one step commands inconsistently   Awareness: Intellectual Problem  Solving: Slow processing General Comments: Pt with minimal verbalizations        Exercises     Shoulder Instructions       General Comments VSS - RN in room throughout    Pertinent Vitals/ Pain       Pain Assessment: Faces Faces Pain Scale: Hurts little more Pain Location: L leg Pain Descriptors / Indicators: Grimacing Pain Intervention(s): Limited activity within patient's tolerance;Monitored during session;Repositioned  Home Living                                          Prior Functioning/Environment              Frequency  Min 2X/week        Progress Toward Goals  OT Goals(current goals can now be found in the care plan section)  Progress towards OT goals: Progressing toward goals  Acute Rehab OT Goals Patient Stated Goal: pt unable to state.   OT Goal Formulation: With family Time For Goal Achievement: 09/02/17 Potential to Achieve Goals: Good  Plan Discharge plan remains appropriate    Co-evaluation                 AM-PAC PT "6 Clicks" Daily Activity     Outcome Measure   Help from another person eating meals?: Total Help from another person taking care of personal grooming?: A Lot Help from another person toileting, which includes using toliet, bedpan, or urinal?: Total Help from another person bathing (including washing, rinsing, drying)?: Total Help from another person to put on and taking off regular upper body clothing?: Total Help from another person to put on and taking off regular lower body clothing?: Total 6 Click Score: 7    End of Session Equipment Utilized During Treatment: Gait belt  OT Visit Diagnosis: Unsteadiness on feet (R26.81);Cognitive communication deficit (R41.841);Hemiplegia and hemiparesis Symptoms and signs involving cognitive functions: Cerebral infarction;Nontraumatic SAH Hemiplegia - Right/Left: Right Hemiplegia - dominant/non-dominant: Dominant Hemiplegia - caused by: Nontraumatic  SAH;Cerebral infarction   Activity Tolerance Patient tolerated treatment well   Patient Left in chair;with call bell/phone within reach;with nursing/sitter in room;with chair alarm set;Other (comment)(posey waist belt used)   Nurse Communication Mobility status;Precautions;Other (comment)(in room throughout session)        Time: 1610-9604 OT Time Calculation (min): 25 min  Charges: OT General Charges $OT Visit: 1 Visit OT Treatments $Self Care/Home Management : 8-22 mins $Therapeutic Activity: 8-22 mins  Sherryl Manges OTR/L [phone]   Carla Bauer 08/30/2017, 11:24 AM

## 2017-08-31 LAB — GLUCOSE, CAPILLARY
GLUCOSE-CAPILLARY: 123 mg/dL — AB (ref 65–99)
Glucose-Capillary: 142 mg/dL — ABNORMAL HIGH (ref 65–99)
Glucose-Capillary: 145 mg/dL — ABNORMAL HIGH (ref 65–99)
Glucose-Capillary: 150 mg/dL — ABNORMAL HIGH (ref 65–99)
Glucose-Capillary: 153 mg/dL — ABNORMAL HIGH (ref 65–99)
Glucose-Capillary: 182 mg/dL — ABNORMAL HIGH (ref 65–99)
Glucose-Capillary: 200 mg/dL — ABNORMAL HIGH (ref 65–99)

## 2017-08-31 LAB — URINALYSIS, ROUTINE W REFLEX MICROSCOPIC
Bilirubin Urine: NEGATIVE
Glucose, UA: 50 mg/dL — AB
Hgb urine dipstick: NEGATIVE
Ketones, ur: NEGATIVE mg/dL
Leukocytes, UA: NEGATIVE
Nitrite: NEGATIVE
Protein, ur: NEGATIVE mg/dL
Specific Gravity, Urine: 1.021 (ref 1.005–1.030)
pH: 5 (ref 5.0–8.0)

## 2017-08-31 MED ORDER — CEFAZOLIN SODIUM-DEXTROSE 1-4 GM/50ML-% IV SOLN
1.0000 g | Freq: Three times a day (TID) | INTRAVENOUS | Status: DC
  Administered 2017-08-31 – 2017-09-03 (×9): 1 g via INTRAVENOUS
  Filled 2017-08-31 (×10): qty 50

## 2017-08-31 NOTE — Progress Notes (Signed)
Subjective: Patient resting in bed, continues on tube feedings.  Objective: Vital signs in last 24 hours: Vitals:   08/31/17 0400 08/31/17 0500 08/31/17 0600 08/31/17 0700  BP: 130/74 (!) 140/99 (!) 141/82 (!) 158/126  Pulse: 87 88 78 62  Resp: 11 12 16 15   Temp: 98.6 F (37 C)     TempSrc: Axillary     SpO2: 100% 100% 100% 100%  Weight:  67 kg (147 lb 11.3 oz)    Height:        Intake/Output from previous day: 05/31 0701 - 06/01 0700 In: 3090 [I.V.:240; NG/GT:2700; IV Piggyback:150] Out: 1675 [Urine:1175; Emesis/NG output:500] Intake/Output this shift: Total I/O In: 60 [I.V.:10; NG/GT:50] Out: -   Physical Exam: Opening eyes to voice, right greater than left.  Dressings and incisions clean and dry.  Assessment/Plan: Stable following ventriculoperitoneal shunt placement 2 days ago.  Transferring to 3 W.   Hewitt ShortsNUDELMAN,ROBERT W, MD 08/31/2017, 8:32 AM

## 2017-08-31 NOTE — Progress Notes (Signed)
Report called to Tresa EndoKelly, RN on 3W.  Family notified of patient's transfer to step-down level of care and reasons for doing so.  Daughter stated understood rationale and would be by to see her mother later on in the day.  Patient transported via bed and escorted by RN and nurse tech to [phone].

## 2017-09-01 LAB — URINE CULTURE: Culture: NO GROWTH

## 2017-09-01 LAB — GLUCOSE, CAPILLARY
GLUCOSE-CAPILLARY: 141 mg/dL — AB (ref 65–99)
GLUCOSE-CAPILLARY: 251 mg/dL — AB (ref 65–99)
Glucose-Capillary: 122 mg/dL — ABNORMAL HIGH (ref 65–99)
Glucose-Capillary: 179 mg/dL — ABNORMAL HIGH (ref 65–99)
Glucose-Capillary: 170 mg/dL — ABNORMAL HIGH (ref 65–99)
Glucose-Capillary: 120 mg/dL — ABNORMAL HIGH (ref 65–99)

## 2017-09-01 NOTE — Progress Notes (Signed)
  Speech Language Pathology Treatment: Dysphagia  Patient Details Name: Carla Bauer Siegenthaler MRN: [id number] DOB: 1955-05-30 Today's Date: 09/01/2017 Time: [id number] SLP Time Calculation (min) (ACUTE ONLY): 16 min  Assessment / Plan / Recommendation Clinical Impression  ST follow up for PO readiness.  Patient continues to have cortrak for feedings/medications.  Nursing reported limited verbal output and direction following.  When ST entered room patient was sleeping.  She did rouse to her name but required stimulation to remain awake.  Patient again resistive to oral care but did allow teeth cleaning to outside of teeth.  It's difficult to discern if she unable to perform requested tasks or choosing not to do them.  Attempted oral mechanism exam but patient unable or unwilling to follow commands.  The patient continues to present with an oral and pharyngeal dysphagia.  Anterior escape noted particularly for thin liquids that patient was not sensate too, delayed oral transit/oral holding and suspected delayed swallow trigger.  Question deficits to sensation.  She required cues to swallow at times.  Difficult to tell if from oral holding or delayed swallow trigger.   Recommend keeping her NPO with use of cortrak for all nutrition/meds.  ST will attempt MBS next date to assess swallow physiology and to make recommendations for least restrictive diet.     HPI HPI: Carla Bauer is Bauer 62 y.o. female with no significant history found having fallen out of the bed with R arm weakness and altered mental status. 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 Intubated ETT 5/15- 5/17.      SLP Plan  MBS       Recommendations  Diet recommendations: NPO Medication Administration: Via alternative means                General recommendations: Rehab consult Oral Care Recommendations: Oral care QID Follow up Recommendations: Inpatient  Rehab SLP Visit Diagnosis: Dysphagia, oropharyngeal phase (R13.12) Plan: MBS       GO               Dimas AguasMelissa Kareena Arrambide, MA, CCC-SLP Acute Rehab SLP [phone]  Fleet ContrasMelissa N David Towson 09/01/2017, 3:15 PM

## 2017-09-01 NOTE — Progress Notes (Signed)
Vitals:   08/31/17 1949 08/31/17 2113 09/01/17 0342 09/01/17 0800  BP: 123/67 108/65 124/89 108/79  Pulse: 73  67 63  Resp: 18  14 18   Temp: 97.8 F (36.6 C)  (!) 97.5 F (36.4 C) 98.1 F (36.7 C)  TempSrc: Oral  Oral Oral  SpO2: 100%  100% 100%  Weight:      Height:         Patient resting in bed, appears comfortable.  Continues on tube feedings.  Dressings from VP shunt procedure clean and dry.  Plan: Continue supportive care.  Hewitt ShortsNUDELMAN,ROBERT W, MD 09/01/2017, 11:04 AM

## 2017-09-02 ENCOUNTER — Inpatient Hospital Stay (HOSPITAL_COMMUNITY): Payer: Medicaid Other

## 2017-09-02 LAB — GLUCOSE, CAPILLARY
GLUCOSE-CAPILLARY: 142 mg/dL — AB (ref 65–99)
GLUCOSE-CAPILLARY: 130 mg/dL — AB (ref 65–99)
GLUCOSE-CAPILLARY: 156 mg/dL — AB (ref 65–99)
GLUCOSE-CAPILLARY: 126 mg/dL — AB (ref 65–99)
Glucose-Capillary: 131 mg/dL — ABNORMAL HIGH (ref 65–99)

## 2017-09-02 MED ORDER — RESOURCE THICKENUP CLEAR PO POWD
ORAL | Status: DC | PRN
Start: 2017-09-02 — End: 2017-09-03
  Filled 2017-09-02: qty 125

## 2017-09-02 NOTE — Progress Notes (Signed)
Modified Barium Swallow Progress Note  Patient Details  Name: Carla Bauer A Lasecki MRN: [id number] Date of Birth: 1956/01/11  Today's Date: 09/02/2017  Modified Barium Swallow completed.  Full report located under Chart Review in the Imaging Section.  Brief recommendations include the following:  Clinical Impression  Pt demonstrates a primary oral dysphagia secondary to decreased initiation and awareness as well as right buccal and labial motor and sensory deficits. When pt is maximally attentive she is able to grasp and cup and take a sip of liquid with min tactile and verbal cues to initiate. Even in best conditions there is some right buccal residual and posterior spillage to pharynx. With thin liquids this leads to intermittent sensed penetration and aspiration before the swallow. Nectar is tolerated even if majority of bolus reaches the pyrforms prior to swallow initiation. As study progressed however pt became less attentive and oral holding increased, particularly with puree. SLP was able to elicit a final swallow with a liquid wash but oral suction was still needed to remove a significant amount of oral residue. Given excellent airway protection with nectar and puree and variable cognitive function will initiate a Dys 1/nectar diet and monitor for intake. Pts automaticity and awareness may improve, or pt may have severe oral holding and limited nutritional support. Will need to trial diet to determine this and follow closely for staff education and tolerance.    Swallow Evaluation Recommendations       SLP Diet Recommendations: Dysphagia 1 (Puree) solids;Nectar thick liquid   Liquid Administration via: Cup;Spoon   Medication Administration: Crushed with puree   Supervision: Patient able to self feed   Compensations: Slow rate;Small sips/bites;Monitor for anterior loss;Minimize environmental distractions   Postural Changes: Seated upright at 90 degrees   Oral Care Recommendations: Oral  care before and after PO   Other Recommendations: Have oral suction available;Order thickener from pharmacy  [facility] Surgery Center IncBonnie Pansey Pinheiro, MA CCC-SLP [phone]   Doc Mandala, Riley NearingBonnie Caroline 09/02/2017,1:18 PM

## 2017-09-02 NOTE — Progress Notes (Signed)
Physical Therapy Treatment Patient Details Name: Carla Bauer MRN: [id number] DOB: [birth year] Today's Date: 09/02/2017    History of Present Illness pt is a 62 y/o female with no significant medical history admitted with R sided weakness.  Pt s/p L crani for aneurysm clipping, evacuation of left temporal hematoma and placement of right frontal ventricular drain.  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 PT session focusing on bed mobility and general transfers from bed to recliner. Does continue to require significant assist with all mobility with patient assisting some, but with limited attention to complete task. EOB sitting for >5 min working on gaze and cross body movements as well as upright posturing with patient tendency to push to R side. Transfer via Stedy to recliner with patient able to assist to pull up into a standing position but still with heavy forward + R lateral trunk lean. Will continue to follow acutely to maximize functional mobility.       Follow Up Recommendations  CIR     Equipment Recommendations  Other (comment)(TBD)    Recommendations for Other Services Rehab consult     Precautions / Restrictions Precautions Precautions: Fall Restrictions Weight Bearing Restrictions: No    Mobility  Bed Mobility Overal bed mobility: Needs Assistance Bed Mobility: Rolling;Supine to Sit Rolling: Max assist;+2 for physical assistance   Supine to sit: Max assist;+2 for physical assistance     General bed mobility comments: verbal cues to reach for bed rail for rolling - limited carryover; able to sit EOB with Mod-Max A for trunk support for >5 min - working on R gaze and rotational movements at trunk  Transfers Overall transfer level: Needs assistance Equipment used: 2 person hand held assist Transfers: Sit to/from Stand Sit to Stand:  Max assist;+2 physical assistance         General transfer comment: forward trunk lean + R lateral lean with static stance, difficulty raising head  Ambulation/Gait                 Stairs             Wheelchair Mobility    Modified Rankin (Stroke Patients Only) Modified Rankin (Stroke Patients Only) Pre-Morbid Rankin Score: No symptoms Modified Rankin: Severe disability     Balance Overall balance assessment: Needs assistance Sitting-balance support: Single extremity supported;Feet supported Sitting balance-Leahy Scale: Poor Sitting balance - Comments: heavy support from PT for upright posture Postural control: Right lateral lean Standing balance support: Single extremity supported;Bilateral upper extremity supported Standing balance-Leahy Scale: Poor Standing balance comment: x1 stand with PT (x2) with hevay forward lean and poor posturing; x1 stand in [place] with patient able to assist to stand with use of L UE to pull up                            Cognition Arousal/Alertness: Lethargic Behavior During Therapy: Flat affect Overall Cognitive Status: Impaired/Different from baseline Area of Impairment: Orientation;Attention;Following commands;Safety/judgement;Problem solving                 Orientation Level: Disoriented to;Place;Time;Situation Current Attention Level: Focused   Following Commands: Follows one step commands inconsistently;Follows one step commands with increased time Safety/Judgement: Decreased awareness of safety;Decreased awareness of deficits   Problem Solving: Slow processing;Decreased initiation;Difficulty sequencing;Requires verbal cues;Requires tactile cues General Comments:  very minimal verbalizations throughout session, would look at PT some during conversation, consistent gaze to L side      Exercises      General Comments        Pertinent Vitals/Pain Pain Assessment: Faces Faces Pain Scale: No hurt     Home Living                      Prior Function            PT Goals (current goals can now be found in the care plan section) Acute Rehab PT Goals PT Goal Formulation: With patient/family Time For Goal Achievement: 09/16/17 Potential to Achieve Goals: Fair Progress towards PT goals: Progressing toward goals    Frequency    Min 3X/week      PT Plan Current plan remains appropriate    Co-evaluation              AM-PAC PT "6 Clicks" Daily Activity  Outcome Measure  Difficulty turning over in bed (including adjusting bedclothes, sheets and blankets)?: Unable Difficulty moving from lying on back to sitting on the side of the bed? : Unable Difficulty sitting down on and standing up from a chair with arms (e.g., wheelchair, bedside commode, etc,.)?: Unable Help needed moving to and from a bed to chair (including a wheelchair)?: Total Help needed walking in hospital room?: Total Help needed climbing 3-5 steps with a railing? : Total 6 Click Score: 6    End of Session Equipment Utilized During Treatment: Gait belt Activity Tolerance: Patient limited by fatigue;Patient limited by lethargy Patient left: in chair;with call bell/phone within reach;with chair alarm set;with nursing/sitter in room Nurse Communication: Mobility status PT Visit Diagnosis: Hemiplegia and hemiparesis Hemiplegia - Right/Left: Right Hemiplegia - dominant/non-dominant: Dominant Hemiplegia - caused by: Cerebral infarction     Time: 5409-8119 PT Time Calculation (min) (ACUTE ONLY): 61 min  Charges:  $Therapeutic Activity: 53-67 mins                    G Codes:       Kipp Laurence, PT, DPT 09/02/17 4:48 PM

## 2017-09-02 NOTE — Progress Notes (Signed)
Inpatient Rehabilitation Admissions Coordinator  I contacted pt's daughter, Phineas Real by phone. She prefers inpt rehab admit and then pt to d/c home with her and she and family members will provide 24/7 assist. She and her sister, Ronny Bacon, do not communicate. I then met with daughter, Ronny Bacon, in pt room. Patient down for procedure. Ronny Bacon states she currently does not have an apartment for Mom to d/c to and I again discussed with her that she and her sister need to communicate to assist in caring for their Mom. I will reach out to pt's spouse, Christy Sartorius, do finalize his preference for rehab venue and who will be assisting with decision making.  Danne Baxter, RN, MSN Rehab Admissions Coordinator [phone] 09/02/2017 1:00 PM

## 2017-09-02 NOTE — Progress Notes (Signed)
  NEUROSURGERY PROGRESS NOTE   No issues overnight. Pt reports doing "fair"  EXAM:  BP 134/67 (BP Location: Right Arm)   Pulse 69   Temp 98.1 F (36.7 C) (Oral)   Resp 14   Ht 5\' 1"  (1.549 m)   Wt 69.1 kg (152 lb 5.4 oz)   SpO2 99%   BMI 28.78 kg/m   Awake, alert Answers simple questions Move LUE/LLE well Right hemiplegia Wounds c/d/i  IMPRESSION:  62 y.o. female s/p SAH, neurologically stable.  Dysphagia, SLP plans MBS  PLAN: - Will await SLP recs before deciding about need for PEG

## 2017-09-02 NOTE — Progress Notes (Signed)
Occupational Therapy Treatment Patient Details Name: Carla Bauer MRN: [id number] DOB: [birth year] Today's Date: 09/02/2017    History of present illness pt is a 62 y/o female with no significant medical history admitted with R sided weakness.  Pt s/p L crani for aneurysm clipping, evacuation of left temporal hematoma and placement of right frontal ventricular drain.  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 OT placing wash cloth over pt's face for 3 minutes with pt making no movement to remove it on her own. OT placed cloth in pt's L hand and required hand over hand assistance to wash face. OT repositioned pt in bed for comfort into R side lying position. Pt voicing a few words but unable to distinguish them. Pt remained in bed with HOB elevated and RN present with medications.    Follow Up Recommendations  CIR;Supervision/Assistance - 24 hour    Equipment Recommendations  None recommended by OT    Recommendations for Other Services Rehab consult    Precautions / Restrictions Precautions Precautions: Fall Restrictions Weight Bearing Restrictions: No       Mobility Bed Mobility Overal bed mobility: Needs Assistance Bed Mobility: Rolling Rolling: Mod assist         General bed mobility comments: manual facilitation for rolling            ADL either performed or assessed with clinical judgement   ADL Overall ADL's : Needs assistance/impaired     Grooming: Wash/dry hands;Wash/dry face;Cueing for sequencing Grooming Details (indicate cue type and reason): total hand over hand assistance                       Cognition Arousal/Alertness: Awake/alert Behavior During Therapy: Flat affect Overall Cognitive Status: Impaired/Different from baseline Area of Impairment:  Following commands;Awareness;Attention;Problem solving          Current Attention Level: Focused   Following Commands: Follows one step commands inconsistently Safety/Judgement: Decreased awareness of safety;Decreased awareness of deficits Awareness: Intellectual Problem Solving: Slow processing General Comments: Pt with minimal verbalizations                   Pertinent Vitals/ Pain       Pain Assessment: Faces Faces Pain Scale: Hurts a little bit Pain Location: L leg Pain Descriptors / Indicators: Grimacing Pain Intervention(s): Limited activity within patient's tolerance;Monitored during session         Frequency  Min 2X/week        Progress Toward Goals  OT Goals(current goals can now be found in the care plan section)  Progress towards OT goals: Progressing toward goals  Acute Rehab OT Goals Patient Stated Goal: pt unable to state.    Plan Discharge plan remains appropriate       AM-PAC PT "6 Clicks" Daily Activity     Outcome Measure   Help from another person eating meals?: Total Help from another person taking care of personal grooming?: Total Help from another person toileting, which includes using toliet, bedpan, or urinal?: Total Help from another person bathing (including washing, rinsing, drying)?: Total Help from another person to put on and taking off regular upper body clothing?: Total Help from another person to put on and taking off regular lower body clothing?: Total 6 Click Score: 6    End of Session  OT Visit Diagnosis: Unsteadiness on feet (R26.81);Cognitive communication deficit (R41.841);Hemiplegia and hemiparesis Symptoms and signs involving cognitive functions: Cerebral infarction;Nontraumatic SAH Hemiplegia - Right/Left: Right Hemiplegia - dominant/non-dominant: Dominant Hemiplegia - caused by: Nontraumatic SAH;Cerebral infarction   Activity Tolerance Patient limited by fatigue   Patient Left in bed;with call bell/phone  within reach;with bed alarm set;with nursing/sitter in room   Nurse Communication Precautions;Other (comment)(in room during session)        Time: [id number] OT Time Calculation (min): 13 min  Charges: OT General Charges $OT Visit: 1 Visit OT Treatments $Self Care/Home Management : 8-22 mins    [name], [name] 09/02/2017, 10:24 AM

## 2017-09-03 ENCOUNTER — Inpatient Hospital Stay (HOSPITAL_COMMUNITY)
Admission: RE | Admit: 2017-09-03 | Discharge: 2017-09-27 | DRG: 057 | Disposition: A | Discharge status: Home Health | Payer: Medicaid Other | Source: Intra-hospital | Attending: Physical Medicine & Rehabilitation | Admitting: Physical Medicine & Rehabilitation

## 2017-09-03 ENCOUNTER — Encounter (HOSPITAL_COMMUNITY): Payer: Self-pay

## 2017-09-03 ENCOUNTER — Other Ambulatory Visit: Payer: Self-pay

## 2017-09-03 DIAGNOSIS — R4702 Dysphasia: Secondary | ICD-10-CM | POA: Diagnosis present

## 2017-09-03 DIAGNOSIS — I69151 Hemiplegia and hemiparesis following nontraumatic intracerebral hemorrhage affecting right dominant side: Secondary | ICD-10-CM | POA: Diagnosis present

## 2017-09-03 DIAGNOSIS — G8110 Spastic hemiplegia affecting unspecified side: Secondary | ICD-10-CM

## 2017-09-03 DIAGNOSIS — E46 Unspecified protein-calorie malnutrition: Secondary | ICD-10-CM | POA: Diagnosis present

## 2017-09-03 DIAGNOSIS — F419 Anxiety disorder, unspecified: Secondary | ICD-10-CM | POA: Diagnosis present

## 2017-09-03 DIAGNOSIS — G8191 Hemiplegia, unspecified affecting right dominant side: Secondary | ICD-10-CM

## 2017-09-03 DIAGNOSIS — Z982 Presence of cerebrospinal fluid drainage device: Secondary | ICD-10-CM

## 2017-09-03 DIAGNOSIS — I739 Peripheral vascular disease, unspecified: Secondary | ICD-10-CM | POA: Diagnosis present

## 2017-09-03 DIAGNOSIS — R451 Restlessness and agitation: Secondary | ICD-10-CM | POA: Diagnosis not present

## 2017-09-03 DIAGNOSIS — R1312 Dysphagia, oropharyngeal phase: Secondary | ICD-10-CM

## 2017-09-03 DIAGNOSIS — S06353S Traumatic hemorrhage of left cerebrum with loss of consciousness of 1 hours to 5 hours 59 minutes, sequela: Secondary | ICD-10-CM

## 2017-09-03 DIAGNOSIS — I10 Essential (primary) hypertension: Secondary | ICD-10-CM | POA: Diagnosis present

## 2017-09-03 DIAGNOSIS — D649 Anemia, unspecified: Secondary | ICD-10-CM | POA: Diagnosis present

## 2017-09-03 DIAGNOSIS — I611 Nontraumatic intracerebral hemorrhage in hemisphere, cortical: Secondary | ICD-10-CM

## 2017-09-03 DIAGNOSIS — Z931 Gastrostomy status: Secondary | ICD-10-CM

## 2017-09-03 DIAGNOSIS — R4701 Aphasia: Secondary | ICD-10-CM | POA: Diagnosis present

## 2017-09-03 DIAGNOSIS — N390 Urinary tract infection, site not specified: Secondary | ICD-10-CM | POA: Diagnosis present

## 2017-09-03 DIAGNOSIS — R7309 Other abnormal glucose: Secondary | ICD-10-CM

## 2017-09-03 DIAGNOSIS — R739 Hyperglycemia, unspecified: Secondary | ICD-10-CM | POA: Diagnosis present

## 2017-09-03 DIAGNOSIS — E870 Hyperosmolality and hypernatremia: Secondary | ICD-10-CM

## 2017-09-03 DIAGNOSIS — R000 Tachycardia, unspecified: Secondary | ICD-10-CM

## 2017-09-03 DIAGNOSIS — S06360A Traumatic hemorrhage of cerebrum, unspecified, without loss of consciousness, initial encounter: Secondary | ICD-10-CM

## 2017-09-03 DIAGNOSIS — I69391 Dysphagia following cerebral infarction: Secondary | ICD-10-CM

## 2017-09-03 DIAGNOSIS — R1310 Dysphagia, unspecified: Secondary | ICD-10-CM | POA: Diagnosis present

## 2017-09-03 DIAGNOSIS — Z6827 Body mass index (BMI) 27.0-27.9, adult: Secondary | ICD-10-CM | POA: Diagnosis not present

## 2017-09-03 DIAGNOSIS — S06323S Contusion and laceration of left cerebrum with loss of consciousness of 1 hour to 5 hours 59 minutes, sequela: Secondary | ICD-10-CM

## 2017-09-03 DIAGNOSIS — S0633AA Contusion and laceration of cerebrum, unspecified, with loss of consciousness status unknown, initial encounter: Secondary | ICD-10-CM

## 2017-09-03 LAB — GLUCOSE, CAPILLARY
Glucose-Capillary: 133 mg/dL — ABNORMAL HIGH (ref 65–99)
Glucose-Capillary: 145 mg/dL — ABNORMAL HIGH (ref 65–99)
Glucose-Capillary: 172 mg/dL — ABNORMAL HIGH (ref 65–99)

## 2017-09-03 LAB — CBC
HCT: 31.6 % — ABNORMAL LOW (ref 36.0–46.0)
Hemoglobin: 9.9 g/dL — ABNORMAL LOW (ref 12.0–15.0)
MCH: 29.7 pg (ref 26.0–34.0)
MCHC: 31.3 g/dL (ref 30.0–36.0)
MCV: 94.9 fL (ref 78.0–100.0)
PLATELETS: 376 10*3/uL (ref 150–400)
RBC: 3.33 MIL/uL — ABNORMAL LOW (ref 3.87–5.11)
RDW: 15.9 % — AB (ref 11.5–15.5)
WBC: 9.7 10*3/uL (ref 4.0–10.5)

## 2017-09-03 LAB — CREATININE, SERUM
Creatinine, Ser: 0.5 mg/dL (ref 0.44–1.00)
GFR calc Af Amer: >60 mL/min (ref >60)
GFR calc non Af Amer: >60 mL/min (ref >60)

## 2017-09-03 MED ORDER — ONDANSETRON HCL 4 MG PO TABS: 4.0000 mg | ORAL_TABLET | ORAL | Status: DC | PRN

## 2017-09-03 MED ORDER — ACETAMINOPHEN 325 MG PO TABS
650.0000 mg | ORAL_TABLET | ORAL | Status: DC | PRN
  Administered 2017-09-17 – 2017-09-22 (×4): 650 mg via ORAL
  Filled 2017-09-03 (×5): qty 2

## 2017-09-03 MED ORDER — INSULIN GLARGINE 100 UNIT/ML ~~LOC~~ SOLN
20.0000 [IU] | Freq: Every day | SUBCUTANEOUS | Status: DC
  Administered 2017-09-03 – 2017-09-26 (×23): 20 [IU] via SUBCUTANEOUS
  Filled 2017-09-03 (×24): qty 0.2

## 2017-09-03 MED ORDER — ACETAMINOPHEN 650 MG RE SUPP: 650.0000 mg | RECTAL | Status: DC | PRN

## 2017-09-03 MED ORDER — ASPIRIN 81 MG PO CHEW: 81.0000 mg | CHEWABLE_TABLET | Freq: Every day | ORAL | 0 refills | Status: DC

## 2017-09-03 MED ORDER — ACETAMINOPHEN 160 MG/5ML PO SOLN
650.0000 mg | ORAL | Status: DC | PRN
  Administered 2017-09-17: 650 mg
  Filled 2017-09-03 (×2): qty 20.3

## 2017-09-03 MED ORDER — HEPARIN SODIUM (PORCINE) 5000 UNIT/ML IJ SOLN
5000.0000 [IU] | Freq: Three times a day (TID) | INTRAMUSCULAR | Status: AC
  Administered 2017-09-03 – 2017-09-15 (×37): 5000 [IU] via SUBCUTANEOUS
  Filled 2017-09-03 (×36): qty 1

## 2017-09-03 MED ORDER — ASPIRIN 81 MG PO CHEW
81.0000 mg | CHEWABLE_TABLET | Freq: Every day | ORAL | Status: DC
  Administered 2017-09-04 – 2017-09-27 (×23): 81 mg via ORAL
  Filled 2017-09-03 (×23): qty 1

## 2017-09-03 MED ORDER — PSYLLIUM 95 % PO PACK
1.0000 | PACK | Freq: Two times a day (BID) | ORAL | Status: DC
  Administered 2017-09-03 – 2017-09-05 (×4): 1 via ORAL
  Filled 2017-09-03 (×6): qty 1

## 2017-09-03 MED ORDER — METOPROLOL TARTRATE 25 MG/10 ML ORAL SUSPENSION
50.0000 mg | Freq: Two times a day (BID) | ORAL | Status: DC
  Administered 2017-09-03 – 2017-09-11 (×17): 50 mg
  Filled 2017-09-03 (×18): qty 20

## 2017-09-03 MED ORDER — RESOURCE THICKENUP CLEAR PO POWD
ORAL | Status: DC | PRN
  Filled 2017-09-03 (×2): qty 125

## 2017-09-03 MED ORDER — FREE WATER
400.0000 mL | Freq: Four times a day (QID) | Status: DC
  Administered 2017-09-03 – 2017-09-17 (×53): 400 mL

## 2017-09-03 MED ORDER — PRO-STAT SUGAR FREE PO LIQD
30.0000 mL | Freq: Every day | ORAL | Status: DC
  Administered 2017-09-04 – 2017-09-06 (×3): 30 mL
  Filled 2017-09-03 (×3): qty 30

## 2017-09-03 MED ORDER — NIMODIPINE 60 MG/20ML PO SOLN: 60.0000 mg | ORAL | 0 refills | Status: DC

## 2017-09-03 MED ORDER — ONDANSETRON HCL 4 MG/2ML IJ SOLN: 4.0000 mg | INTRAMUSCULAR | Status: DC | PRN

## 2017-09-03 MED ORDER — NIMODIPINE 60 MG/20ML PO SOLN
60.0000 mg | ORAL | Status: AC
  Administered 2017-09-03 – 2017-09-05 (×11): 60 mg
  Filled 2017-09-03 (×11): qty 20

## 2017-09-03 MED ORDER — HEPARIN SODIUM (PORCINE) 5000 UNIT/ML IJ SOLN: 5000.0000 [IU] | Freq: Three times a day (TID) | INTRAMUSCULAR | Status: DC

## 2017-09-03 MED ORDER — INSULIN ASPART 100 UNIT/ML ~~LOC~~ SOLN
0.0000 [IU] | SUBCUTANEOUS | Status: DC
  Administered 2017-09-03: 2 [IU] via SUBCUTANEOUS
  Administered 2017-09-04: 3 [IU] via SUBCUTANEOUS
  Administered 2017-09-04: 1 [IU] via SUBCUTANEOUS
  Administered 2017-09-04 (×3): 2 [IU] via SUBCUTANEOUS
  Administered 2017-09-05: 3 [IU] via SUBCUTANEOUS
  Administered 2017-09-05 – 2017-09-06 (×2): 1 [IU] via SUBCUTANEOUS
  Administered 2017-09-06 – 2017-09-07 (×3): 2 [IU] via SUBCUTANEOUS
  Administered 2017-09-07 (×2): 1 [IU] via SUBCUTANEOUS
  Administered 2017-09-07: 2 [IU] via SUBCUTANEOUS
  Administered 2017-09-08: 1 [IU] via SUBCUTANEOUS
  Administered 2017-09-08 (×2): 2 [IU] via SUBCUTANEOUS
  Administered 2017-09-08 – 2017-09-09 (×2): 1 [IU] via SUBCUTANEOUS
  Administered 2017-09-09: 2 [IU] via SUBCUTANEOUS
  Administered 2017-09-09: 1 [IU] via SUBCUTANEOUS
  Administered 2017-09-09 – 2017-09-10 (×5): 2 [IU] via SUBCUTANEOUS
  Administered 2017-09-10 – 2017-09-11 (×3): 1 [IU] via SUBCUTANEOUS
  Administered 2017-09-11 – 2017-09-12 (×5): 2 [IU] via SUBCUTANEOUS
  Administered 2017-09-13: 3 [IU] via SUBCUTANEOUS
  Administered 2017-09-13: 1 [IU] via SUBCUTANEOUS
  Administered 2017-09-13 – 2017-09-15 (×7): 2 [IU] via SUBCUTANEOUS
  Administered 2017-09-15: 1 [IU] via SUBCUTANEOUS
  Administered 2017-09-15: 2 [IU] via SUBCUTANEOUS
  Administered 2017-09-15: 1 [IU] via SUBCUTANEOUS
  Administered 2017-09-16 – 2017-09-17 (×3): 2 [IU] via SUBCUTANEOUS

## 2017-09-03 MED ORDER — OSMOLITE 1.2 CAL PO LIQD
1000.0000 mL | ORAL | Status: DC
  Administered 2017-09-03: 1000 mL
  Filled 2017-09-03 (×4): qty 1000

## 2017-09-03 NOTE — Progress Notes (Signed)
Patient arrived to unit with son at bedside Oriented to unit procedures. Resting comfortably.

## 2017-09-03 NOTE — Progress Notes (Signed)
Inpatient Rehabilitation Admissions Coordinator  I met with daughter, Ronny Bacon, at bedside and contacted daughter Phineas Real by phone. Both in agreement to admit to CIR today. I contacted Mariana Single, PA and he will prepare her for d/c. I have notified RN CM and SW. I will make the arrangements to admit today.  Danne Baxter, RN, MSN Rehab Admissions Coordinator [phone] 09/03/2017 12:23 PM

## 2017-09-03 NOTE — Progress Notes (Signed)
Marcello Fennel, MD  Physician  Physical Medicine and Rehabilitation  Consult Note  Signed  Date of Service:  08/20/2017 5:39 AM       Related encounter: ED to Hosp-Admission (Current) from 08/14/2017 in [facility]      Signed      Expand All Collapse All      Show:Clear all [x] Manual[x] Template[] Copied  Added by: [x] Angiulli, Mcarthur Rossetti, PA-C[x] Marcello Fennel, MD   [] Hover for details        Physical Medicine and Rehabilitation Consult Reason for Consult: Right side weakness Referring Physician: Dr. Conchita Paris   HPI: Carla Bauer is a 62 y.o. right-handed female with history of headaches.  Per chart review, patient lives with her daughter who works 7-3.  Family arranging care as needed on discharge.  One level home with level entry.  Patient independent prior to admission.  Presented 08/14/2017 after being found down by her family with right side weakness.  Cranial CT scan reviewed, showing left temporal hemorrhage.  Per report, a 4 x 2 x 3 0.4 x 2.1 cm intraparenchymal hematoma in the medial left temporal lobe with intraventricular penetration in filling of the left lateral ventricle with blood.  Mass-effect with right to left shift of 11 mm.  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  Underwent left craniotomy for clipping of aneurysm, evacuation of left temporal hematoma placement of right frontal external ventricular drain 08/14/2017 per Dr. Conchita Paris.  Hospital course presently on intravenous Rocephin for suspected UTI.  Patient is NPO with nasogastric tube feeds for nutritional support.  Subcutaneous heparin was initiated 08/19/2017 after latest CT of the head showing to be stable.  Physical and occupational therapy evaluation completed 08/19/2017 with recommendations of physical medicine rehab  consult.  Review of Systems  Unable to perform ROS: Acuity of condition   History reviewed. No pertinent past medical history., unable to obtain from patient.       Past Surgical History:  Procedure Laterality Date  . CESAREAN SECTION    . CRANIOTOMY Left 08/14/2017   Procedure: 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;  Surgeon: Lisbeth Renshaw, MD;  Location: [facility] OR;  Service: Neurosurgery;  Laterality: Left;   History reviewed. No pertinent family history., unable to obtain from patient Social History:  reports that she has never smoked. She does not have any smokeless tobacco history on file. She reports that she does not drink alcohol or use drugs. Allergies: No Known Allergies       Medications Prior to Admission  Medication Sig Dispense Refill  . acetaminophen (TYLENOL) 500 MG tablet Take 1 tablet (500 mg total) by mouth every 6 (six) hours as needed. (Patient taking differently: Take 500 mg by mouth every 6 (six) hours as needed for mild pain. ) 30 tablet 0  . Aspirin-Salicylamide-Caffeine (BC HEADACHE POWDER PO) Take 1 packet by mouth daily as needed (headache).      Home: Home Living Family/patient expects to be discharged to:: Private residence Living Arrangements: Children Available Help at Discharge: Family, Available 24 hours/day Type of Home: House Home Access: Level entry Home Layout: One level Additional Comments: Pt lives with daughter who works 7-3, and her children who are young.  daughter reports that pt will have family or friends assist her at discharge while she is at work, but isn't able to state exactly  who that is.  The other daughter became tearful and stated "I need to have a say in what happens... she's my mother, and I am the oldest"  Functional History: Prior Function Level of Independence: Independent Functional Status:  Mobility: Bed Mobility Overal bed mobility: Needs Assistance Bed Mobility: Rolling,  Sidelying to Sit Rolling: Max assist Sidelying to sit: Max assist, +2 for physical assistance(to max assist) General bed mobility comments: cued to push up with L UE and pt did assist with additional truncal assist given Transfers Overall transfer level: Needs assistance Equipment used: 2 person hand held assist Transfers: Sit to/from Stand, Squat Pivot Transfers Sit to Stand: Max assist, +2 physical assistance Squat pivot transfers: Max assist, +2 physical assistance General transfer comment: cues for direction.  Assist of 2 to help pt forward and upright.  Significant truncal and R LE assist throughout squat transfer. Ambulation/Gait General Gait Details: not able today  ADL: ADL Overall ADL's : Needs assistance/impaired Eating/Feeding: NPO Grooming: Wash/dry hands, Wash/dry face, Maximal assistance, Sitting Upper Body Bathing: Total assistance, Sitting Lower Body Bathing: Total assistance, Sit to/from stand Upper Body Dressing : Total assistance, Sitting Lower Body Dressing: Total assistance, Sit to/from stand Toilet Transfer: Maximal assistance, +2 for physical assistance, BSC Toileting- Clothing Manipulation and Hygiene: Total assistance, Sit to/from stand Functional mobility during ADLs: Maximal assistance, +2 for physical assistance  Cognition: Cognition Overall Cognitive Status: Impaired/Different from baseline Orientation Level: Oriented to person, Disoriented to place, Disoriented to time, Disoriented to situation Cognition Arousal/Alertness: Lethargic, Awake/alert Behavior During Therapy: Flat affect, WFL for tasks assessed/performed Overall Cognitive Status: Impaired/Different from baseline Area of Impairment: Orientation, Attention, Memory, Following commands, Awareness, Safety/judgement, Problem solving Current Attention Level: Focused Following Commands: Follows one step commands inconsistently Safety/Judgement: Decreased awareness of safety, Decreased awareness  of deficits Awareness: Intellectual Problem Solving: Slow processing, Requires verbal cues, Requires tactile cues, Difficulty sequencing, Decreased initiation General Comments: Pt with signficant communication deficits.  She will follow some one step motor commands with multi modal cues.  She will give one word responses occasionally.    Blood pressure (!) 165/101, pulse (!) 109, temperature 98.3 F (36.8 C), temperature source Axillary, resp. rate (!) 24, height 5\' 1"  (1.549 m), weight 64.8 kg (142 lb 13.7 oz), SpO2 99 %. Physical Exam  Vitals reviewed. Constitutional: She appears well-developed and well-nourished.  HENT:  Craniotomy site clean and dry.   Nasogastric tube in place EVD in place  Eyes: Right eye exhibits no discharge. Left eye exhibits no discharge.  Pupils sluggish but reactive to light Left ptosis  Neck: Neck supple. No thyromegaly present.  Cardiovascular: Regular rhythm.  +Tachycardia  Respiratory: Effort normal and breath sounds normal.  Patient with limited inspiratory effort  GI: Soft. Bowel sounds are normal. She exhibits no distension.  Musculoskeletal:  No edema or tenderness in extremities  Neurological:  Lethargic  She remained nonverbal throughout exam  Exam limited by participation, but moving LUE freely.  No other movement noted Down going babinski on left, no response on right   Skin:  See above  Psychiatric:  Unable to assess due to mentation    LabResultsLast24Hours  Results for orders placed or performed during the hospital encounter of 08/14/17 (from the past 24 hour(s))  Glucose, capillary     Status: Abnormal   Collection Time: 08/19/17  8:26 AM  Result Value Ref Range   Glucose-Capillary 176 (H) 65 - 99 mg/dL   Comment 1 Notify RN    Comment 2 Document in  Chart   Basic metabolic panel     Status: Abnormal   Collection Time: 08/19/17  9:30 AM  Result Value Ref Range   Sodium 148 (H) 135 - 145 mmol/L   Potassium 3.4  (L) 3.5 - 5.1 mmol/L   Chloride 114 (H) 101 - 111 mmol/L   CO2 22 22 - 32 mmol/L   Glucose, Bld 190 (H) 65 - 99 mg/dL   BUN 10 6 - 20 mg/dL   Creatinine, Ser 1.61 0.44 - 1.00 mg/dL   Calcium 9.5 8.9 - 09.6 mg/dL   GFR calc non Af Amer >60 >60 mL/min   GFR calc Af Amer >60 >60 mL/min   Anion gap 12 5 - 15  Procalcitonin - Baseline     Status: None   Collection Time: 08/19/17  9:30 AM  Result Value Ref Range   Procalcitonin 0.23 ng/mL  CBC     Status: Abnormal   Collection Time: 08/19/17 10:48 AM  Result Value Ref Range   WBC 15.5 (H) 4.0 - 10.5 K/uL   RBC 3.52 (L) 3.87 - 5.11 MIL/uL   Hemoglobin 10.3 (L) 12.0 - 15.0 g/dL   HCT 04.5 (L) 40.9 - 81.1 %   MCV 91.2 78.0 - 100.0 fL   MCH 29.3 26.0 - 34.0 pg   MCHC 32.1 30.0 - 36.0 g/dL   RDW 91.4 78.2 - 95.6 %   Platelets 289 150 - 400 K/uL  Glucose, capillary     Status: Abnormal   Collection Time: 08/19/17 11:53 AM  Result Value Ref Range   Glucose-Capillary 173 (H) 65 - 99 mg/dL  Glucose, capillary     Status: Abnormal   Collection Time: 08/19/17  4:03 PM  Result Value Ref Range   Glucose-Capillary 152 (H) 65 - 99 mg/dL  Glucose, capillary     Status: Abnormal   Collection Time: 08/19/17  7:48 PM  Result Value Ref Range   Glucose-Capillary 154 (H) 65 - 99 mg/dL  Glucose, capillary     Status: Abnormal   Collection Time: 08/19/17 11:34 PM  Result Value Ref Range   Glucose-Capillary 210 (H) 65 - 99 mg/dL  Glucose, capillary     Status: Abnormal   Collection Time: 08/20/17  3:26 AM  Result Value Ref Range   Glucose-Capillary 179 (H) 65 - 99 mg/dL  CBC     Status: Abnormal   Collection Time: 08/20/17  3:52 AM  Result Value Ref Range   WBC 15.4 (H) 4.0 - 10.5 K/uL   RBC 3.62 (L) 3.87 - 5.11 MIL/uL   Hemoglobin 10.6 (L) 12.0 - 15.0 g/dL   HCT 21.3 (L) 08.6 - 57.8 %   MCV 91.4 78.0 - 100.0 fL   MCH 29.3 26.0 - 34.0 pg   MCHC 32.0 30.0 - 36.0 g/dL   RDW 46.9 62.9 - 52.8 %    Platelets 300 150 - 400 K/uL  Basic metabolic panel     Status: Abnormal   Collection Time: 08/20/17  3:52 AM  Result Value Ref Range   Sodium 147 (H) 135 - 145 mmol/L   Potassium 3.3 (L) 3.5 - 5.1 mmol/L   Chloride 112 (H) 101 - 111 mmol/L   CO2 24 22 - 32 mmol/L   Glucose, Bld 189 (H) 65 - 99 mg/dL   BUN 13 6 - 20 mg/dL   Creatinine, Ser 4.13 0.44 - 1.00 mg/dL   Calcium 9.7 8.9 - 24.4 mg/dL   GFR calc non Af Amer >60 >60  mL/min   GFR calc Af Amer >60 >60 mL/min   Anion gap 11 5 - 15      ImagingResults(Last48hours)  Ct Head Wo Contrast  Result Date: 08/19/2017 CLINICAL DATA:  Follow-up examination for intracranial hemorrhage. EXAM: CT HEAD WITHOUT CONTRAST TECHNIQUE: Contiguous axial images were obtained from the base of the skull through the vertex without intravenous contrast. COMPARISON:  Prior CT from 08/18/2017 FINDINGS: Brain: Postoperative changes from prior left frontotemporal craniotomy for surgical clipping of left P com aneurysm again seen. Continued interval decrease in blood products at the anterior left temporal lobe, now only faintly visible. Extra-axial collection overlying the left frontotemporal convexity is unchanged without significant mass effect. Small volume subdural hemorrhage along the left tentorium relatively unchanged as well. 5 mm of left-to-right midline shift is stable. Right frontal approach ventriculostomy catheter in place with tip in the right lateral ventricle. Intraventricular hemorrhage is slightly decreased from previous. Stable ventricular size. No hydrocephalus or ventricular trapping. Evolving subacute infarct at the lateral left thalamus/posterior limb of the left internal capsule with inferior extension again noted, stable. No new intracranial hemorrhage or large vessel territory infarct. No other new acute intracranial abnormality. Atrophy with chronic small vessel ischemic changes again noted. Vascular: Aneurysm clip in the region  of the left posterior communicating artery. No hyperdense vessel. Skull: Postoperative changes from left frontotemporal craniotomy. Skin staples remain in place. Overlying scalp edema and swelling is increased from previous. Sinuses/Orbits: No acute finding about the globes and orbital soft tissues. Paranasal sinuses remain clear. Nasogastric tube partially visualized. No significant mastoid effusion. Other: None. IMPRESSION: 1. Postoperative changes from prior left frontotemporal craniotomy for surgical clipping of left P com aneurysm and left temporal hematoma evacuation. Hemorrhage at the anterior left temporal lobe continues to decrease. 5 mm of left-to-right shift stable from previous. 2. Right frontal approach ventriculostomy in place with continued interval decrease in intraventricular hemorrhage. No hydrocephalus or ventricular trapping. 3. Unchanged small volume subdural hemorrhage along the left tentorium. 4. Continued interval evolution of subacute ischemic infarct at the lateral left thalamus/posterior limb of the left internal capsule. 5. No other new acute intracranial abnormality. Electronically Signed   By: Rise Mu M.D.   On: 08/19/2017 06:22   Dg Chest Port 1 View  Result Date: 08/19/2017 CLINICAL DATA:  Fever EXAM: PORTABLE CHEST 1 VIEW COMPARISON:  Portable exam 1208 hours compared to 08/18/2017 FINDINGS: Feeding tube traverses esophagus into stomach. Normal heart size, mediastinal contours, and pulmonary vascularity. Mild bibasilar atelectasis. Lungs otherwise clear. No infiltrate, pleural effusion or pneumothorax. Bones unremarkable. IMPRESSION: Mild bibasilar atelectasis. Electronically Signed   By: Ulyses Southward M.D.   On: 08/19/2017 12:32   Dg Chest Port 1 View  Result Date: 08/18/2017 CLINICAL DATA:  Patient with fever. EXAM: PORTABLE CHEST 1 VIEW COMPARISON:  Chest radiograph 08/17/2017 FINDINGS: Enteric tube courses inferior to the diaphragm. Stable cardiac and  mediastinal contours. No consolidative pulmonary opacities. No pleural effusion or pneumothorax. IMPRESSION: No acute cardiopulmonary process. Electronically Signed   By: Annia Belt M.D.   On: 08/18/2017 13:39     Assessment/Plan: Diagnosis: intraparenchymal hematoma in the medial left temporal lobe  Labs and images independently reviewed.  Records reviewed and summated above. Stroke: Continue secondary stroke prophylaxis and Risk Factor Modification listed below:   Blood Pressure Management:  Continue current medication with prn's with permisive HTN per primary team Prediabetes management:   Right sided hemiparesis: fit for orthosis to prevent contractures (resting hand splint for day, wrist  cock up splint at night, PRAFO, etc) Motor recovery: Fluoxetine  1. Does the need for close, 24 hr/day medical supervision in concert with the patient's rehab needs make it unreasonable for this patient to be served in a less intensive setting? Yes  2. Co-Morbidities requiring supervision/potential complications: UTI (cont abx), headaches (ensure pain does not limit therapies), tachypnea (monitor RR and O2 Sats with increased physical exertion), Tachycardia (monitor in accordance with pain and increasing activity), HTN (monitor and provide prns in accordance with increased physical exertion and pain), prediabetes (Monitor in accordance with exercise and adjust meds as necessary), leukocytosis (cont to monitor for signs and symptoms of infection, further workup if indicated), ABLA (transfuse if necessary to ensure appropriate perfusion for increased activity tolerance), hypokalemia (continue to monitor and replete as necessary) 3. Due to bladder management, bowel management, safety, skin/wound care, disease management, medication administration, pain management and patient education, does the patient require 24 hr/day rehab nursing? Yes 4. Does the patient require coordinated care of a physician, rehab nurse,  PT (1-2 hrs/day, 5 days/week), OT (1-2 hrs/day, 5 days/week) and SLP (1-2 hrs/day, 5 days/week) to address physical and functional deficits in the context of the above medical diagnosis(es)? Yes Addressing deficits in the following areas: balance, endurance, locomotion, strength, transferring, bowel/bladder control, bathing, dressing, feeding, grooming, toileting, cognition, speech, language, swallowing and psychosocial support 5. Can the patient actively participate in an intensive therapy program of at least 3 hrs of therapy per day at least 5 days per week? Yes 6. The potential for patient to make measurable gains while on inpatient rehab is excellent 7. Anticipated functional outcomes upon discharge from inpatient rehab are min assist  with PT, min assist with OT, min assist with SLP. 8. Estimated rehab length of stay to reach the above functional goals is: 22-27 days. 9. Anticipated D/C setting: Home 10. Anticipated post D/C treatments: HH therapy and Home excercise program 11. Overall Rehab/Functional Prognosis: good  RECOMMENDATIONS: This patient's condition is appropriate for continued rehabilitative care in the following setting: CIR when medically stable Patient has agreed to participate in recommended program. Potentially Note that insurance prior authorization may be required for reimbursement for recommended care.  Comment: Rehab Admissions Coordinator to follow up.   I have personally performed a face to face diagnostic evaluation, including, but not limited to relevant history and physical exam findings, of this patient and developed relevant assessment and plan.  Additionally, I have reviewed and concur with the physician assistant's documentation above.   Maryla MorrowAnkit Patel, MD, ABPMR Mcarthur Rossettianiel J Angiulli, PA-C 08/20/2017          Revision History                        Routing History

## 2017-09-03 NOTE — Progress Notes (Signed)
CSW met with patient's daughter, Carla Bauer, at bedside to discuss discharge plan. CSW explained options available for the patient, including what's being recommended for CIR placement or that the patient will be placed in SNF otherwise. [name] daughter said that she absolutely does not want to look into SNF placement, they want her to stay for CIR. CSW discussed barriers of the family not communicating and not being able to figure out 24 hour care for the patient when she leaves CIR, which is something that they will need to have figured out prior to admission. [name] daughter discussed how she knows that she and her sister need to just put their differences aside and come together for their mom, because she's their only mom and they are adults. [name] daughter agreed to work on improving communication and coming up with a plan so that they could avoid SNF placement for the patient.  CSW to follow, just in case family is unable to come up with 24/7 support for the patient.  Laveda Abbe, [place] Clinical Social Worker [phone]

## 2017-09-03 NOTE — Progress Notes (Signed)
Standley BrookingBoyette, Jerika Wales G, RN  Rehab Admission Coordinator  Physical Medicine and Rehabilitation  PMR Pre-admission  Signed  Date of Service:  08/29/2017 12:27 PM       Related encounter: ED to Hosp-Admission (Current) from 08/14/2017 in [facility]      Signed           Show:Clear all [x] Manual[x] Template[x] Copied  Added by: [x] Standley BrookingBoyette, Neaveh Belanger G, RN   [] Hover for details   PMR Admission Coordinator Pre-Admission Assessment  Patient: Carla Bauer is an 62 y.o., female MRN: [id number] DOB: 18-Jan-1956 Height: 5\' 1"  (154.9 cm) Weight: 70.1 kg (154 lb 8.7 oz)                                                                                                                                                  Insurance Information  PRIMARY: uninsured       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 I have asked Shanda BumpsJessica to follow up with Glee ArvinLatoya, Main caregiver, not Carla SmilingNatasha.  [phone]  Emergency Contact Information         Contact Information    Name Relation Home Work Mobile   Carla Bauer Daughter [phone]     Randall HissSligh, Carla Bauer Daughter   [phone]   Iline OvenHargrove, Carla Bauer   [phone]     Current Medical History  Patient Admitting Diagnosis: intraparenchymal hematoma in the left medical temporal lobe  History of Present Illness: Carla Slighis a 62 year old right-handed female with history of headaches. Presented 08/14/2017 after being found down by her family with right sided weakness. Cranial CT scan showed left temporal hemorrhage. Per report 4.2 x 3.4 x 2.1 intraparenchymal hematoma in the medial left temporal lobe with intraventricular penetration filling of the  left lateral ventricle with blood. Mass-effect with right to left shift of 11 mm. 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 Underwent left craniotomy for clipping of aneurysm, evacuation of left temporal hematoma placement of right frontal external ventricular drain 08/14/2017 per Dr. Conchita ParisNundkumar. Hospital course UTI treated with Rocephin. Renal ultrasound was completed for UTI showing no hydronephrosis. Diet has been advanced to a dysphagia #1 nectar thick liquid as well as the nasogastric tube remains in place for nutritional support. Subcutaneous heparin initiated for DVT prophylaxis 08/19/2017.   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 TCD without evidence of vasospasm. 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 She was monitored without  worsening form VP shunt placement. Needs to complete 21 day course of Nimotop. Staples to be removed June 10 th to 13 th.  Total: 20 NIHSS  Past Medical History  History reviewed. No pertinent past medical history.  Family History  family history is not on file.  Prior Rehab/Hospitalizations:  Has the patient had major surgery during 100 days prior to admission? No  Current Medications   Current Facility-Administered Medications:  .  0.9 %  sodium chloride infusion, , Intravenous, Continuous, Beryle Lathe, MD, Last Rate: 10 mL/hr at 09/01/17 1715 .  acetaminophen (TYLENOL) tablet 650 mg, 650 mg, Oral, Q4H PRN **OR** acetaminophen (TYLENOL) solution 650 mg, 650 mg, Per Tube, Q4H PRN, 650 mg at 08/22/17 1749 **OR** acetaminophen (TYLENOL) suppository 650 mg, 650 mg, Rectal, Q4H PRN, Rejeana Brock, MD .  aspirin chewable tablet 81 mg, 81 mg, Oral, Daily, Marvel Plan, MD, 81 mg at 09/03/17 1010 .  ceFAZolin (ANCEF) IVPB 1 g/50 mL  premix, 1 g, Intravenous, Q8H, Nundkumar, Neelesh, MD, Last Rate: 100 mL/hr at 09/03/17 1305, 1 g at 09/03/17 1305 .  chlorhexidine (PERIDEX) 0.12 % solution 15 mL, 15 mL, Mouth Rinse, BID, Lisbeth Renshaw, MD, 15 mL at 09/03/17 1009 .  feeding supplement (OSMOLITE 1.2 CAL) liquid 1,000 mL, 1,000 mL, Per Tube, Continuous, Agarwala, Ravi, MD, Last Rate: 50 mL/hr at 09/02/17 1759, 1,000 mL at 09/02/17 1759 .  feeding supplement (PRO-STAT SUGAR FREE 64) liquid 30 mL, 30 mL, Per Tube, Daily, Agarwala, Ravi, MD, 30 mL at 09/03/17 1009 .  fentaNYL (SUBLIMAZE) injection 25-50 mcg, 25-50 mcg, Intravenous, Q2H PRN, Whiteheart, Kathryn A, NP, 50 mcg at 09/03/17 0519 .  free water 400 mL, 400 mL, Per Tube, Q6H, Agarwala, Ravi, MD, 400 mL at 09/03/17 1209 .  heparin injection 5,000 Units, 5,000 Units, Subcutaneous, Q8H, Marvel Plan, MD, 5,000 Units at 09/03/17 0518 .  ibuprofen (ADVIL,MOTRIN) 100 MG/5ML suspension 400 mg, 400 mg, Oral, Q6H PRN, Tia Alert, MD, 400 mg at 08/20/17 2018 .  insulin aspart (novoLOG) injection 0-9 Units, 0-9 Units, Subcutaneous, Q4H, Karl Ito, MD, 2 Units at 09/03/17 1305 .  insulin glargine (LANTUS) injection 20 Units, 20 Units, Subcutaneous, QHS, Lynnell Catalan, MD, 20 Units at 09/02/17 2152 .  iopamidol (ISOVUE-370) 76 % injection 50 mL, 50 mL, Intravenous, Once PRN, Agarwala, Ravi, MD .  labetalol (NORMODYNE,TRANDATE) injection 10-40 mg, 10-40 mg, Intravenous, Q10 min PRN, Costella, Vincent J, PA-C .  MEDLINE mouth rinse, 15 mL, Mouth Rinse, q12n4p, Lisbeth Renshaw, MD, 15 mL at 09/03/17 1312 .  metoprolol tartrate (LOPRESSOR) 25 mg/10 mL oral suspension 50 mg, 50 mg, Per Tube, BID, Marvel Plan, MD, 50 mg at 09/03/17 1009 .  niMODipine (NYMALIZE) 60 MG/20ML oral solution 60 mg, 60 mg, Per Tube, Q4H, Lisbeth Renshaw, MD, 60 mg at 09/03/17 1010 .  ondansetron (ZOFRAN) tablet 4 mg, 4 mg, Oral, Q4H PRN **OR** ondansetron (ZOFRAN) injection 4 mg, 4 mg, Intravenous,  Q4H PRN, Costella, Vincent J, PA-C .  promethazine (PHENERGAN) tablet 12.5-25 mg, 12.5-25 mg, Oral, Q4H PRN, Costella, Vincent J, PA-C .  psyllium (HYDROCIL/METAMUCIL) packet 1 packet, 1 packet, Oral, BID, Lynnell Catalan, MD, 1 packet at 09/03/17 1009 .  RESOURCE THICKENUP CLEAR, , Oral, PRN, Lisbeth Renshaw, MD  Patients Current Diet:   MBS 6/3 with diet upgrade to Dysphagia 1 nectar thick liquids. Meds crushed with puree.  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 None of the above  Prior Functional Level Prior Function Level of Independence: Independent  Self Care: Did the patient need help bathing, dressing, using the toilet or eating?  Independent  Indoor Mobility: Did the patient need assistance with walking from room to room (with or without device)? Independent  Stairs: Did the patient need assistance with internal or external stairs (with or without device)? Independent  Functional Cognition: Did the patient need help planning regular tasks such as shopping or remembering to take medications? Independent  Current Functional Level Cognition  Arousal/Alertness: Awake/alert Overall Cognitive Status: Impaired/Different from baseline Current Attention Level: Focused Orientation Level: Oriented to person, Disoriented to place, Disoriented to time, Disoriented to situation Following Commands: Follows one step commands inconsistently, Follows one step commands with increased time Safety/Judgement: Decreased awareness of safety, Decreased awareness of  deficits General Comments: improved verablizations during todays session - likely due to daughter being in room. Continues to focus gaze towards L side Attention: Focused Focused Attention: Impaired Focused Attention Impairment: Verbal basic, Functional basic    Extremity Assessment (includes Sensation/Coordination)  Upper Extremity Assessment: RUE deficits/detail, LUE deficits/detail RUE Deficits / Details: R UE brunstrom I RUE Sensation: decreased light touch, decreased proprioception RUE Coordination: decreased fine motor, decreased gross motor LUE Deficits / Details: actively doing thumb up and attempting thumbs down but only turning hand sideways. pt attempting "okay"sign but unable to form. pt with grasp 3 out 5  LUE Coordination: decreased fine motor, decreased gross motor  Lower Extremity Assessment: Defer to PT evaluation RLE Deficits / Details: As she became more alert, pt spontaneously moved left LE around.  She was able to bear full weight during standing. LLE Deficits / Details: associated movements only to cough otherwise PROM was stiff LLE Coordination: decreased fine motor, decreased gross motor    ADLs  Overall ADL's : Needs assistance/impaired Eating/Feeding: NPO Grooming: Wash/dry hands, Wash/dry face, Cueing for sequencing Grooming Details (indicate cue type and reason): total hand over hand assistance Upper Body Bathing: Total assistance Lower Body Bathing: Total assistance Upper Body Dressing : Total assistance Lower Body Dressing: Total assistance Toilet Transfer: Moderate assistance, +2 for physical assistance, +2 for safety/equipment, Stand-pivot Toilet Transfer Details (indicate cue type and reason): simulated through bed to chair transfer Toileting- Clothing Manipulation and Hygiene: Total assistance, Sit to/from stand Functional mobility during ADLs: Moderate assistance, +2 for physical assistance, +2 for safety/equipment, Cueing for sequencing General  ADL Comments: pt transfer from bed to chair simualted toilet transfer.     Mobility  Overal bed mobility: Needs Assistance Bed Mobility: Supine to Sit Rolling: Max assist, +2 for physical assistance Sidelying to sit: Max assist Supine to sit: Max assist, +2 for physical assistance Sit to supine: +2 for physical assistance, Max assist General bed mobility comments: improved initiation to take L LE towards EOB with verbal cueing. Able to sit EOB x 5 min with Min A to SUP with improved trunk control and upright posturing    Transfers  Overall transfer level: Needs assistance Equipment used: 2 person hand held assist Transfer via Lift Equipment: Stedy Transfers: Sit to/from Stand, [employer] Sit to Stand: +2 physical assistance, Mod  assist Stand pivot transfers: Max assist, +2 physical assistance Squat pivot transfers: Max assist, +2 physical assistance General transfer comment: improved participation with sit to stand at bedside today with more upright posture, however continued rounded shoulders and forward neck flexion    Ambulation / Gait / Stairs / Wheelchair Mobility  Ambulation/Gait General Gait Details: NT    Posture / Balance Dynamic Sitting Balance Sitting balance - Comments: Min A to SUP while seated EOB for short durations Balance Overall balance assessment: Needs assistance Sitting-balance support: Single extremity supported, Feet supported Sitting balance-Leahy Scale: Poor Sitting balance - Comments: Min A to SUP while seated EOB for short durations Postural control: Right lateral lean Standing balance support: Single extremity supported, Bilateral upper extremity supported Standing balance-Leahy Scale: Poor Standing balance comment: x2 stand with PT (and tech). Improved forward lean and upright posture. Heavy verbal cueing to lift head for forward gaze    Special needs/care consideration BiPAP/CPAP n/a CPM  N/a Continuous Drip IV  N/a Dialysis  n/a Life Vest  N/a Oxygen  N/a Special Bed  N/a Trach Size  N/a Wound Vac n/a Skin surgical incision with staples                           Bowel mgmt: incontinent  LBM  6/3 Bladder mgmt: incontinent; indwelling catheter 6/1  Diabetic mgmt  N/a Cortrak placed 08/16/17   Previous Home Environment Living Arrangements: (lived with daughter, Carla Bauer pta)  Lives With: Daughter(Carla Bauer) Available Help at Discharge: Family, Available 24 hours/day(daughter, Carla Bauer, requests pt to live with her) Type of Home: House Home Layout: One level Home Access: Level entry Bathroom Shower/Tub: Tub/shower unit, Engineer, building services: Standard Bathroom Accessibility: Yes How Accessible: Accessible via walker Home Care Services: No Additional Comments: Pt lives with daughter who works 7-3, and her children who are young.  daughter reports that pt will have family or friends assist her at discharge while she is at work, but isn't able to state exactly who that is.  The other daughter became tearful and stated "I need to have a say in what happens... she's my mother, and I am the oldest" Patient lived with [place], but Carla Bauer wants pt to move in with her. Daughters do not agree. Patient is separated from Bauer for 1 year, not legal separation. Carla Bauer, daughter does not have place to live for she and her Mom. I have told both daughters that pt chose to share housing with Carla Bauer pta and was separated from her Bauer. I therefore assume that patient will return to live at [facility] and pt's home at d/c so daughters need to pull together the plan to have family caregivers at that home.    Carla Bauer is main caregiver and contact but works days. Easily reachable by phone. Carla Bauer is at bedside during the day, but is not caregiver.  Discharge Living Setting Plans for Discharge Living Setting: [name] home, Lives with (comment)(Carla Bauer and patient live together) Type of Home at Discharge: House Discharge Home  Layout: One level Discharge Home Access: Level entry Discharge Bathroom Shower/Tub: Tub/shower unit, Curtain Discharge Bathroom Toilet: Standard Discharge Bathroom Accessibility: Yes How Accessible: Accessible via walker Does the patient have any problems obtaining your medications?: Yes (Describe)  Social/Family/Support Systems Patient Roles: Bauer, Parent(CNA) Contact Information: Lewis Shock, daughters Anticipated Caregiver: family, mainly [place] Anticipated Industrial/product designer Information: see above Ability/Limitations of Caregiver: [name] works first shift, Government social research officer, unemployed Engineer, structural Availability: 24/7 Discharge Plan Discussed with Primary  Caregiver: Yes Is Caregiver In Agreement with Plan?: Yes Does Caregiver/Family have Issues with Lodging/Transportation while Pt is in Rehab?: No  Goals/Additional Needs Patient/Family Goal for Rehab: Min assist with PT, OT, and SLP Expected length of stay: ELOS 22 to 27 days Special Service Needs: Patient seperated; not legally. Chiquita Loth Pt/Family Agrees to Admission and willing to participate: Yes Program Orientation Provided & Reviewed with Pt/Caregiver Including Roles  & Responsibilities: Yes  Decrease burden of Care through IP rehab admission: n/a  Possible need for SNF placement upon discharge:not anticipated. If SNF needed, acute SW had begun [facility] and [facility] was working on [employer] for placement locally or [employer].  Patient Condition: This patient's medical and functional status has changed since the consult dated: 08/20/2017 in which the Rehabilitation Physician determined and documented that the patient's condition is appropriate for intensive rehabilitative care in an inpatient rehabilitation facility. See "History of Present Illness" (above) for medical update. Functional changes are: overall max to total assist. [name] medical and functional status update has been discussed with the Rehabilitation  physician and patient remains appropriate for inpatient rehabilitation. Will admit to inpatient rehab today.  Preadmission Screen Completed By:  Ottie Glazier, RN MSN with updates by  Clois Dupes, 09/03/2017 1:13 PM ______________________________________________________________________   Discussed status with Dr. Riley Kill on 09/03/2017 at  1312 and received telephone approval for admission today.  Admission Coordinator:  Clois Dupes, time 3086 Date 09/03/2017             Cosigned by: Ranelle Oyster, MD at 09/03/2017 1:25 PM  Revision History

## 2017-09-03 NOTE — Progress Notes (Signed)
  Speech Language Pathology Treatment: Dysphagia;Cognitive-Linquistic  Patient Details Name: Carla RightDorothy A Bauer MRN: [id number] DOB: 02/14/1956 Today's Date: 09/03/2017 Time: 1010-1037 SLP Time Calculation (min) (ACUTE ONLY): 27 min  Assessment / Plan / Recommendation Clinical Impression  Pt making excellent progress today, seen up in chair with am meal. Pt responded to basic biographical questions with a 5-10 second delay; though she was much more automatic with her daughter. Responses to open ended questions were paraphasic and perseverative. Though she reportedly refused PO earlier this am, SLP offered 4 oz of nectar thick juice with min tactile cues for initiating self feeding facing to supervision level assist. Anterior spillage occurred, but no oral holding. Increased assistance required with self feeding of 4 oz of yogurt and 2 oz of pureed fruit with moderate Bauer buccal residue also noted. Pt able to verbalize satiety. SLP discussed strategies with pts daughter and RN and nursing student. Will need to determine if pt can consistently take meds orally and consume adequate meals prior to NG tube removal.  Recommend CIR for intensive SLP interventions.   HPI HPI: Carla Bauer is a 62 y.o. female with no significant history found having fallen out of the bed with R arm weakness and altered mental status. 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 Intubated ETT 5/15- 5/17.      SLP Plan  Continue with current plan of care       Recommendations  Diet recommendations: Dysphagia 1 (puree);Nectar-thick liquid Liquids provided via: Cup Medication Administration: Crushed with puree Supervision: Staff to assist with self feeding;Full supervision/cueing for compensatory strategies;Trained caregiver to feed patient Compensations: Slow rate;Small sips/bites;Monitor for anterior loss;Lingual sweep for clearance of  pocketing Postural Changes and/or Swallow Maneuvers: Seated upright 90 degrees;Upright 30-60 min after meal                General recommendations: Rehab consult Oral Care Recommendations: (oral care after PO) Follow up Recommendations: Inpatient Rehab SLP Visit Diagnosis: Dysphagia, oropharyngeal phase (R13.12) Attention and concentration deficit following: Cerebral infarction Plan: Continue with current plan of care       GO               [facility] Hospital & Health CenterBonnie Sathvika Ojo, MA CCC-SLP [phone]  Claudine MoutonDeBlois, Theresia Pree Caroline 09/03/2017, 10:44 AM

## 2017-09-03 NOTE — Care Management Note (Signed)
Pt discharged to CIR today. CM signing off.

## 2017-09-03 NOTE — H&P (Signed)
Physical Medicine and Rehabilitation Admission H&P       Chief Complaint  Patient presents with  . Code Stroke  : HPI: Carla Bauer is a 62 year old right-handed female with history of headaches.  Per chart review, patient lives with her daughter.  Family arranging care as needed on discharge.  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  Presented 08/14/2017 after being found down by her family with right sided weakness.  Cranial CT scan showed left temporal hemorrhage.  Per report 4.2 x 3.4 x 2.1 intraparenchymal hematoma in the medial left temporal lobe with intraventricular penetration filling of the left lateral ventricle with blood.  Mass-effect with right to left shift of 11 mm.  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  Underwent left craniotomy for clipping of aneurysm, evacuation of left temporal hematoma placement of right frontal external ventricular drain 08/14/2017 per Dr. Conchita Paris.  Multiple attempts made to wean her ventricular catheter unsuccessfully and underwent placement of laparoscopic assisted right ventricular peritoneal shunt 08/29/2017 per Dr. Conchita Paris.  Hospital course UTI treated with Rocephin.  Renal ultrasound was completed for UTI showing no hydronephrosis.  Diet has been advanced to a dysphagia #1 nectar thick liquid as well as the nasogastric tube remains in place for nutritional support.  Subcutaneous heparin initiated for DVT prophylaxis 08/19/2017.   Physical and occupational therapy ongoing.  Patient was admitted for a comprehensive rehab program.  Review of Systems  Unable to perform ROS: Acuity of condition   History reviewed. No pertinent past medical history.      Past Surgical History:  Procedure Laterality Date  . CESAREAN SECTION    . CRANIOTOMY Left 08/14/2017   Procedure:  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;  Surgeon: Lisbeth Renshaw, MD;  Location: [facility] OR;  Service: Neurosurgery;  Laterality: Left;  . LAPAROSCOPIC REVISION VENTRICULAR-PERITONEAL (V-P) SHUNT N/A 08/29/2017   Procedure: LAPAROSCOPIC REVISION VENTRICULAR-PERITONEAL (V-P) SHUNT;  Surgeon: Abigail Miyamoto, MD;  Location: [facility] OR;  Service: General;  Laterality: N/A;  LAPAROSCOPIC REVISION VENTRICULAR-PERITONEAL (V-P) SHUNT  . VENTRICULOPERITONEAL SHUNT Right 08/29/2017   Procedure: SHUNT INSERTION VENTRICULAR-PERITONEAL;  Surgeon: Lisbeth Renshaw, MD;  Location: [facility] OR;  Service: Neurosurgery;  Laterality: Right;  SHUNT INSERTION VENTRICULAR-PERITONEAL   History reviewed. No pertinent family history. Social History:  reports that she has never smoked. She does not have any smokeless tobacco history on file. She reports that she does not drink alcohol or use drugs. Allergies: No Known Allergies       Medications Prior to Admission  Medication Sig Dispense Refill  . acetaminophen (TYLENOL) 500 MG tablet Take 1 tablet (500 mg total) by mouth every 6 (six) hours as needed. (Patient taking differently: Take 500 mg by mouth every 6 (six) hours as needed for mild pain. ) 30 tablet 0  . Aspirin-Salicylamide-Caffeine (BC HEADACHE POWDER PO) Take 1 packet by mouth daily as needed (headache).      Drug Regimen Review Drug regimen was reviewed and remains appropriate with no significant issues identified  Home: Home Living Family/patient expects to be discharged to:: Private residence Living Arrangements: (lived with daughter, Carla Bauer pta) Available Help at Discharge: Family, Available 24 hours/day(daughter, Carla Bauer, requests pt to live with her) Type of Home: House Home Access: Level entry Home Layout: One level Bathroom Shower/Tub: Tub/shower unit, Engineer, building services: Standard Bathroom Accessibility: Yes Additional  Comments: Pt lives with daughter who works  7-3, and her children who are young.  daughter reports that pt will have family or friends assist her at discharge while she is at work, but isn't able to state exactly who that is.  The other daughter became tearful and stated "I need to have a say in what happens... she's my mother, and I am the oldest"  Lives With: Daughter(Carla Bauer)   Functional History: Prior Function Level of Independence: Independent  Functional Status:  Mobility: Bed Mobility Overal bed mobility: Needs Assistance Bed Mobility: Supine to Sit Rolling: Max assist, +2 for physical assistance Sidelying to sit: Max assist Supine to sit: Max assist, +2 for physical assistance Sit to supine: +2 for physical assistance, Max assist General bed mobility comments: improved initiation to take L LE towards EOB with verbal cueing. Able to sit EOB x 5 min with Min A to SUP with improved trunk control and upright posturing Transfers Overall transfer level: Needs assistance Equipment used: 2 person hand held assist Transfer via Lift Equipment: Stedy Transfers: Sit to/from Stand, Pharmacologist Sit to Stand: +2 physical assistance, Mod assist Stand pivot transfers: Max assist, +2 physical assistance Squat pivot transfers: Max assist, +2 physical assistance General transfer comment: improved participation with sit to stand at bedside today with more upright posture, however continued rounded shoulders and forward neck flexion Ambulation/Gait General Gait Details: NT  ADL: ADL Overall ADL's : Needs assistance/impaired Eating/Feeding: NPO Grooming: Wash/dry hands, Wash/dry face, Cueing for sequencing Grooming Details (indicate cue type and reason): total hand over hand assistance Upper Body Bathing: Total assistance Lower Body Bathing: Total assistance Upper Body Dressing : Total assistance Lower Body Dressing: Total assistance Toilet Transfer: Moderate assistance, +2 for physical assistance, +2 for safety/equipment,  Stand-pivot Toilet Transfer Details (indicate cue type and reason): simulated through bed to chair transfer Toileting- Clothing Manipulation and Hygiene: Total assistance, Sit to/from stand Functional mobility during ADLs: Moderate assistance, +2 for physical assistance, +2 for safety/equipment, Cueing for sequencing General ADL Comments: pt transfer from bed to chair simualted toilet transfer.   Cognition: Cognition Overall Cognitive Status: Impaired/Different from baseline Arousal/Alertness: Awake/alert Orientation Level: Oriented to person, Disoriented to place, Disoriented to time, Disoriented to situation Attention: Focused Focused Attention: Impaired Focused Attention Impairment: Verbal basic, Functional basic Cognition Arousal/Alertness: Awake/alert Behavior During Therapy: Flat affect Overall Cognitive Status: Impaired/Different from baseline Area of Impairment: Orientation, Attention, Following commands, Safety/judgement, Problem solving Orientation Level: Disoriented to, Place, Time, Situation Current Attention Level: Focused Following Commands: Follows one step commands inconsistently, Follows one step commands with increased time Safety/Judgement: Decreased awareness of safety, Decreased awareness of deficits Awareness: Intellectual Problem Solving: Slow processing, Decreased initiation, Difficulty sequencing, Requires verbal cues, Requires tactile cues General Comments: improved verablizations during todays session - likely due to daughter being in room. Continues to focus gaze towards L side  Physical Exam: Blood pressure 111/69, pulse 89, temperature 97.6 F (36.4 C), temperature source Oral, resp. rate 16, height 5\' 1"  (1.549 m), weight 70.1 kg (154 lb 8.7 oz), SpO2 100 %. Physical Exam  Constitutional: No distress.  HENT:  Craniotomy site clean and dry.  Nasogastric tube in place  Eyes: Right eye exhibits no discharge. Left eye exhibits no discharge.  Neck: No  tracheal deviation present. No thyromegaly present.  Cardiovascular: Normal rate. Exam reveals no gallop and no friction rub.  No murmur heard. Respiratory: Effort normal. No respiratory distress. She has no wheezes.  GI: Soft. She exhibits no distension. There  is no tenderness.  Musculoskeletal: She exhibits no edema.  Neurological:  Patient is alert and anxious.  Daughter is at bedside.  Patient remained nonverbal throughout exam and very inconsistent to follow commands or focus to tasks. Moves LUE and LLE spontaneously. Does not initiate movement with RUE or RLE. Left gaze preference.   Psychiatric:  Anxious and disengaged    LabResultsLast48Hours       Results for orders placed or performed during the hospital encounter of 08/14/17 (from the past 48 hour(s))  Glucose, capillary     Status: Abnormal   Collection Time: 09/01/17  4:00 PM  Result Value Ref Range   Glucose-Capillary 141 (H) 65 - 99 mg/dL  Glucose, capillary     Status: Abnormal   Collection Time: 09/01/17  7:50 PM  Result Value Ref Range   Glucose-Capillary 120 (H) 65 - 99 mg/dL   Comment 1 Notify RN    Comment 2 Document in Chart   Glucose, capillary     Status: Abnormal   Collection Time: 09/01/17 11:40 PM  Result Value Ref Range   Glucose-Capillary 251 (H) 65 - 99 mg/dL   Comment 1 Notify RN    Comment 2 Document in Chart   Glucose, capillary     Status: Abnormal   Collection Time: 09/02/17  4:19 AM  Result Value Ref Range   Glucose-Capillary 131 (H) 65 - 99 mg/dL  Glucose, capillary     Status: Abnormal   Collection Time: 09/02/17  8:12 AM  Result Value Ref Range   Glucose-Capillary 142 (H) 65 - 99 mg/dL  Glucose, capillary     Status: Abnormal   Collection Time: 09/02/17 12:54 PM  Result Value Ref Range   Glucose-Capillary 130 (H) 65 - 99 mg/dL   Comment 1 Notify RN    Comment 2 Document in Chart   Glucose, capillary     Status: Abnormal   Collection Time: 09/02/17   4:51 PM  Result Value Ref Range   Glucose-Capillary 156 (H) 65 - 99 mg/dL  Glucose, capillary     Status: Abnormal   Collection Time: 09/02/17  8:07 PM  Result Value Ref Range   Glucose-Capillary 126 (H) 65 - 99 mg/dL   Comment 1 Notify RN    Comment 2 Document in Chart   Glucose, capillary     Status: Abnormal   Collection Time: 09/03/17 12:07 AM  Result Value Ref Range   Glucose-Capillary 145 (H) 65 - 99 mg/dL   Comment 1 Notify RN    Comment 2 Document in Chart   Glucose, capillary     Status: Abnormal   Collection Time: 09/03/17  7:32 AM  Result Value Ref Range   Glucose-Capillary 133 (H) 65 - 99 mg/dL   Comment 1 Notify RN    Comment 2 Document in Chart   Glucose, capillary     Status: Abnormal   Collection Time: 09/03/17 11:34 AM  Result Value Ref Range   Glucose-Capillary 172 (H) 65 - 99 mg/dL   Comment 1 Notify RN    Comment 2 Document in Chart       ImagingResults(Last48hours)  Dg Swallowing Func-speech Pathology  Result Date: 09/02/2017 Objective Swallowing Evaluation: Type of Study: MBS-Modified Barium Swallow Study  Patient Details Name: ANTRICE PAL MRN: [id number] Date of Birth: [birth year] Today's Date: 09/02/2017 Time: SLP Start Time (ACUTE ONLY): 1145 -SLP Stop Time (ACUTE ONLY): 1218 SLP Time Calculation (min) (ACUTE ONLY): 33 min Past Medical History: No past medical history  on file. Past Surgical History: Past Surgical History: Procedure Laterality Date . CESAREAN SECTION   . CRANIOTOMY Left 08/14/2017  Procedure: 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;  Surgeon: Lisbeth Renshaw, MD;  Location: [facility] OR;  Service: Neurosurgery;  Laterality: Left; . LAPAROSCOPIC REVISION VENTRICULAR-PERITONEAL (V-P) SHUNT N/A 08/29/2017  Procedure: LAPAROSCOPIC REVISION VENTRICULAR-PERITONEAL (V-P) SHUNT;  Surgeon: Abigail Miyamoto, MD;  Location: [facility] OR;  Service: General;  Laterality: N/A;  LAPAROSCOPIC REVISION  VENTRICULAR-PERITONEAL (V-P) SHUNT . VENTRICULOPERITONEAL SHUNT Right 08/29/2017  Procedure: SHUNT INSERTION VENTRICULAR-PERITONEAL;  Surgeon: Lisbeth Renshaw, MD;  Location: [facility] OR;  Service: Neurosurgery;  Laterality: Right;  SHUNT INSERTION VENTRICULAR-PERITONEAL HPI: Ms. KELLER MIKELS is a 62 y.o. female with no significant history found having fallen out of the bed with R arm weakness and altered mental status. 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 Intubated ETT 5/15- 5/17.  Subjective: Pt in bed, left gaze preference Assessment / Plan / Recommendation CHL IP CLINICAL IMPRESSIONS 09/02/2017 Clinical Impression Pt demonstrates a primary oral dysphagia secondary to decreased initiation and awareness as well as right buccal and labial motor and sensory deficits. When pt is maximally attentive she is able to grasp and cup and take a sip of liquid with min tactile and verbal cues to initiate. Even in best conditions there is some right buccal residual and posterior spillage to pharynx. With thin liquids this leads to intermittent sensed penetration and aspiration before the swallow. Nectar is tolerated even if majority of bolus reaches the pyrforms prior to swallow initiation. As study progressed however pt became less attentive and oral holding increased, particularly with puree. SLP was able to elicit a final swallow with a liquid wash but oral suction was still needed to remove a significant amount of oral residue. Given excellent airway protection with nectar and puree and variable cognitive function will initiate a Dys 1/nectar diet and monitor for intake. Pts automaticity and awareness may improve, or pt may have severe oral holding and limited nutritional support. Will need to trial diet to determine this and follow closely for staff education and tolerance.  SLP Visit Diagnosis Dysphagia, oropharyngeal phase (R13.12) Attention and  concentration deficit following Cerebral infarction Frontal lobe and executive function deficit following -- Impact on safety and function Severe aspiration risk   CHL IP TREATMENT RECOMMENDATION 09/02/2017 Treatment Recommendations Therapy as outlined in treatment plan below   Prognosis 09/02/2017 Prognosis for Safe Diet Advancement Good Barriers to Reach Goals -- Barriers/Prognosis Comment -- CHL IP DIET RECOMMENDATION 09/02/2017 SLP Diet Recommendations Dysphagia 1 (Puree) solids;Nectar thick liquid Liquid Administration via Cup;Spoon Medication Administration Crushed with puree Compensations Slow rate;Small sips/bites;Monitor for anterior loss;Minimize environmental distractions Postural Changes Seated upright at 90 degrees   CHL IP OTHER RECOMMENDATIONS 09/02/2017 Recommended Consults -- Oral Care Recommendations Oral care before and after PO Other Recommendations Have oral suction available;Order thickener from pharmacy   CHL IP FOLLOW UP RECOMMENDATIONS 09/02/2017 Follow up Recommendations Inpatient Rehab   CHL IP FREQUENCY AND DURATION 09/02/2017 Speech Therapy Frequency (ACUTE ONLY) min 2x/week Treatment Duration 2 weeks      CHL IP ORAL PHASE 09/02/2017 Oral Phase Impaired Oral - Pudding Teaspoon -- Oral - Pudding Cup -- Oral - Honey Teaspoon -- Oral - Honey Cup -- Oral - Nectar Teaspoon Lingual pumping;Reduced posterior propulsion;Holding of bolus;Right anterior bolus loss;Pocketing in anterior sulcus;Right pocketing in lateral sulci;Delayed oral transit;Premature spillage Oral - Nectar Cup Lingual pumping;Reduced posterior propulsion;Holding  of bolus;Right anterior bolus loss;Pocketing in anterior sulcus;Right pocketing in lateral sulci;Delayed oral transit;Premature spillage Oral - Nectar Straw Lingual pumping;Reduced posterior propulsion;Holding of bolus;Right anterior bolus loss;Pocketing in anterior sulcus;Right pocketing in lateral sulci;Delayed oral transit;Premature spillage Oral - Thin Teaspoon -- Oral - Thin  Cup Lingual pumping;Reduced posterior propulsion;Right anterior bolus loss;Delayed oral transit;Premature spillage Oral - Thin Straw -- Oral - Puree Lingual pumping;Reduced posterior propulsion;Holding of bolus;Right anterior bolus loss;Pocketing in anterior sulcus;Right pocketing in lateral sulci;Delayed oral transit;Premature spillage Oral - Mech Soft -- Oral - Regular -- Oral - Multi-Consistency -- Oral - Pill -- Oral Phase - Comment --  No flowsheet data found. No flowsheet data found. No flowsheet data found. DeBlois, Riley Nearing 09/02/2017, 1:21 PM                   Medical Problem List and Plan: 1.  Decreased functional mobility with right-sided weakness secondary to intraparenchymal hematoma in the medial left temporal lobe status post left craniotomy clipping of aneurysm placement of right frontal external ventricular drain 08/14/2017             -admit to inpatient rehab 2.  DVT Prophylaxis/Anticoagulation: Subcutaneous heparin.  Check vascular study 3. Pain Management: Tylenol as needed 4. Mood: Provide emotional support 5. Neuropsych: This patient is not capable of making decisions on her own behalf. 6. Skin/Wound Care: Routine skin checks 7. Fluids/Electrolytes/Nutrition: Routine in and outs with follow-up chemistries             -NGT feedings, adjust rate/H20 as needed 8.  Dysphasia.  Dysphasia #1 nectar liquids.  Nasogastric tube feeds for nutritional support 9.  Hyperglycemia induced from tube feeds.  Continue Lantus insulin for coverage. 10.  Hypertension.  Lopressor 50 mg twice daily.Nimodipine 60 mg every 4 hours x21 days initiated 08/15/2017  Post Admission Physician Evaluation: 1. Functional deficits secondary  to left temporal ICH. 2. Patient is admitted to receive collaborative, interdisciplinary care between the physiatrist, rehab nursing staff, and therapy team. 3. [name] level of medical complexity and substantial therapy needs in context of that medical  necessity cannot be provided at a lesser intensity of care such as a SNF. 4. Patient has experienced substantial functional loss from his/her baseline which was documented above under the "Functional History" and "Functional Status" headings.  Judging by the patient's diagnosis, physical exam, and functional history, the patient has potential for functional progress which will result in measurable gains while on inpatient rehab.  These gains will be of substantial and practical use upon discharge  in facilitating mobility and self-care at the household level. 5. Physiatrist will provide 24 hour management of medical needs as well as oversight of the therapy plan/treatment and provide guidance as appropriate regarding the interaction of the two. 6. The Preadmission Screening has been reviewed and patient status is unchanged unless otherwise stated above. 7. 24 hour rehab nursing will assist with bladder management, bowel management, safety, skin/wound care, disease management, medication administration, pain management and patient education  and help integrate therapy concepts, techniques,education, etc. 8. PT will assess and treat for/with: Lower extremity strength, range of motion, stamina, balance, functional mobility, safety, adaptive techniques and equipment, NMR, cognitive perceptual rx, family education.   Goals are: min assist. 9. OT will assess and treat for/with: ADL's, functional mobility, safety, upper extremity strength, adaptive techniques and equipment, NMR, cognitive-perceptual rx, family ed.   Goals are: min assist. Therapy may not yet proceed with showering this patient. 10. SLP will assess and  treat for/with: cognition, swallowing, communication, language, family ed.  Goals are: min assist. 11. Case Management and Social Worker will assess and treat for psychological issues and discharge planning. 12. Team conference will be held weekly to assess progress toward goals and to determine  barriers to discharge. 13. Patient will receive at least 3 hours of therapy per day at least 5 days per week. 14. ELOS: 23-27 days       15. Prognosis:  excellent    I have personally performed a face to face diagnostic evaluation of this patient and formulated the key components of the plan.  Additionally, I have personally reviewed laboratory data, imaging studies, as well as relevant notes and concur with the physician assistant's documentation above.  Ranelle OysterZachary T. Swartz, MD, [place] DomFAAPMR   Mcarthur Rossettianiel J Angiulli, PA-C 09/03/2017

## 2017-09-03 NOTE — Progress Notes (Signed)
Physical Therapy Treatment Patient Details Name: Carla RightDorothy A Washburn MRN: [id number] DOB: 09-29-1955 Today's Date: 09/03/2017    History of Present Illness pt is a 62 y/o female with no significant medical history admitted with R sided weakness.  Pt s/p L crani for aneurysm clipping, evacuation of left temporal hematoma and placement of Bauer frontal ventricular drain.  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 Today able to sit EOB with greater trunk control with single hand supported with noted less R lateral lean and pushing. Sit to stand transfers improving today as well with improved participation and effort given by patient. Will continue to follow acutely to progress mobility.     Follow Up Recommendations  CIR     Equipment Recommendations  Other (comment)(TBD)    Recommendations for Other Services Rehab consult     Precautions / Restrictions Precautions Precautions: Fall Restrictions Weight Bearing Restrictions: No    Mobility  Bed Mobility Overal bed mobility: Needs Assistance Bed Mobility: Supine to Sit     Supine to sit: Max assist;+2 for physical assistance     General bed mobility comments: improved initiation to take L LE towards EOB with verbal cueing. Able to sit EOB x 5 min with Min A to SUP with improved trunk control and upright posturing  Transfers Overall transfer level: Needs assistance Equipment used: 2 person hand held assist Transfers: Sit to/from [employer] CorporationStand;Stand Pivot Transfers Sit to Stand: +2 physical assistance;Mod assist Stand pivot transfers: Max assist;+2 physical assistance       General transfer comment: improved participation with sit to stand at bedside today with more upright posture, however continued  rounded shoulders and forward neck flexion  Ambulation/Gait                 Stairs             Wheelchair Mobility    Modified Rankin (Stroke Patients Only) Modified Rankin (Stroke Patients Only) Pre-Morbid Rankin Score: No symptoms Modified Rankin: Severe disability     Balance Overall balance assessment: Needs assistance Sitting-balance support: Single extremity supported;Feet supported Sitting balance-Leahy Scale: Poor Sitting balance - Comments: Min A to SUP while seated EOB for short durations Postural control: Bauer lateral lean Standing balance support: Single extremity supported;Bilateral upper extremity supported Standing balance-Leahy Scale: Poor Standing balance comment: x2 stand with PT (and tech). Improved forward lean and upright posture. Heavy verbal cueing to lift head for forward gaze                            Cognition Arousal/Alertness: Awake/alert Behavior During Therapy: Flat affect Overall Cognitive Status: Impaired/Different from baseline Area of Impairment: Orientation;Attention;Following commands;Safety/judgement;Problem solving                 Orientation Level: Disoriented to;Place;Time;Situation Current Attention Level: Focused   Following Commands: Follows one step commands inconsistently;Follows one step commands with increased time Safety/Judgement: Decreased awareness of safety;Decreased awareness of deficits   Problem Solving: Slow processing;Decreased initiation;Difficulty sequencing;Requires verbal cues;Requires tactile cues General Comments: improved verablizations during todays session - likely due to daughter being in room. Continues to focus gaze towards L side      Exercises      General Comments  Pertinent Vitals/Pain Pain Assessment: Faces Faces Pain Scale: Hurts a little bit Pain Location: L leg Pain Descriptors / Indicators: Grimacing(reaching for and rubbing leg) Pain  Intervention(s): Limited activity within patient's tolerance;Monitored during session;Repositioned    Home Living                      Prior Function            PT Goals (current goals can now be found in the care plan section) Acute Rehab PT Goals PT Goal Formulation: With patient/family Time For Goal Achievement: 09/16/17 Potential to Achieve Goals: Fair Progress towards PT goals: Progressing toward goals    Frequency    Min 3X/week      PT Plan Current plan remains appropriate    Co-evaluation              AM-PAC PT "6 Clicks" Daily Activity  Outcome Measure  Difficulty turning over in bed (including adjusting bedclothes, sheets and blankets)?: Unable Difficulty moving from lying on back to sitting on the side of the bed? : Unable Difficulty sitting down on and standing up from a chair with arms (e.g., wheelchair, bedside commode, etc,.)?: Unable Help needed moving to and from a bed to chair (including a wheelchair)?: Total Help needed walking in hospital room?: Total Help needed climbing 3-5 steps with a railing? : Total 6 Click Score: 6    End of Session Equipment Utilized During Treatment: Gait belt Activity Tolerance: Patient tolerated treatment well Patient left: in chair;with call bell/phone within reach;with chair alarm set;with nursing/sitter in room Nurse Communication: Mobility status PT Visit Diagnosis: Hemiplegia and hemiparesis Hemiplegia - Bauer/Left: Bauer Hemiplegia - dominant/non-dominant: Dominant Hemiplegia - caused by: Cerebral infarction     Time: 1610-9604 PT Time Calculation (min) (ACUTE ONLY): 25 min  Charges:  $Therapeutic Activity: 23-37 mins                    G Codes:       Kipp Laurence, PT, DPT 09/03/17 11:17 AM

## 2017-09-03 NOTE — Discharge Summary (Signed)
Physician Discharge Summary  Patient ID: Carla RightDorothy A Bolt MRN: [id number] DOB/AGE: 07/18/1955 62 y.o.  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 female who presented to ER on 5/15 as code stroke. Work up was significant for ICH secondary to left PCOM. She subsequently underwent left pterional craniotomy for clipping as well as EVD placement due to hydrocephalus by Dr Conchita ParisNundkumar. 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 TCD without evidence of vasospasm. Now has baseline right hemiplegia. During hospital admission, EVD was challenged, but unfortunately patient required VP shunt placement on 5/30 due to persistent hydrocephalus. She was monitored without worsening from VP shunt placement. She underwent SLP eval and dysphagia diet rec. No PEG tube indicated.  Patient remained neurologically stable. Stable for d/c to CIR. Needs to complete 21 day course of Nimotop. Staples need to be removed June 10-13th.  Treatments: Surgery 5/15PROCEDURE: 1. Left pterional craniotomy for clipping of left posterior to medicating artery aneurysm 2. Evacuation of left temporal hematoma 3. Placement of right frontal external ventricular drain  5/30: PROCEDURE: 1. Laparoscopic assisted right ventriculoperitoneal shunt  placement  Discharge Exam: Blood pressure 111/69, pulse 89, temperature 97.6 F (36.4 C), temperature source Oral, resp. rate 16, height 5\' 1"  (1.549 m), weight 70.1 kg (154 lb 8.7 oz), SpO2 100 %. Awake  Non-communicative this am, but family at bedside reports communicating with them earlier. Recognizes family by name Right hemiplegia. Partial RLE flexion with painful stimulus. Follows commands LUE/LLE with encouragement.  Disposition: Discharge disposition: [facility] Not Defined       Discharge Instructions    Call MD for:  difficulty breathing, headache or visual disturbances   Complete by:  As directed    Call MD for:  persistant dizziness or light-headedness   Complete by:  As directed    Call MD for:  redness, tenderness, or signs of infection (pain, swelling, redness, odor or green/yellow discharge around incision site)   Complete by:  As directed    Call MD for:  severe uncontrolled pain   Complete by:  As directed    Call MD for:  temperature >100.4   Complete by:  As directed    Diet general   Complete by:  As directed    Driving Restrictions   Complete by:  As directed    Do not drive until given clearance.   Increase activity slowly   Complete by:  As directed    Lifting restrictions   Complete by:  As directed    Do not lift anything >10lbs. Avoid bending and twisting in awkward positions. Avoid bending at the back.   May shower / Bathe   Complete by:  As directed    In 24 hours. Okay to wash wound with warm soapy water. Avoid scrubbing the wound. Pat dry.  Remove dressing in 24 hours   Complete by:  As directed      Allergies as of 09/03/2017   No Known Allergies     Medication List    STOP taking these medications   BC HEADACHE POWDER PO     TAKE these medications   acetaminophen 500 MG tablet Commonly known as:  TYLENOL Take 1 tablet (500 mg total) by mouth every 6 (six) hours as needed. What changed:  reasons to take  this   aspirin 81 MG chewable tablet Chew 1 tablet (81 mg total) by mouth daily. Start taking on:  09/04/2017   niMODipine 60 MG/20ML Soln Commonly known as:  NYMALIZE Place 20 mLs (60 mg total) into feeding tube every 4 (four) hours for 3 days.      Follow-up Information     [facility]. Schedule an appointment as soon as possible for a visit in 4 week(s).   Specialty:  Radiology Contact information: [address] [place] [address] [phone]       Lisbeth Renshaw, MD. Schedule an appointment as soon as possible for a visit in 3 week(s).   Specialty:  Neurosurgery Contact information: [address] [place] [address] [phone]           Signed: Alyson Ingles 09/03/2017, 12:15 PM

## 2017-09-03 NOTE — Progress Notes (Signed)
Nutrition Follow-up  DOCUMENTATION CODES:   Not applicable  INTERVENTION:  Continue Osmolite 1.2 @ 50 ml/hr (1200 ml/day) +30 ml Prostat daily providing 1540 kcal, 81 grams protein, and 973 ml free water; meeting 100% of kcal and protein needs.  RD to monitor PO intake on D1  NUTRITION DIAGNOSIS:   Inadequate oral intake related to inability to eat as evidenced by NPO status.  Progressing  GOAL:   Patient will meet greater than or equal to 90% of their needs  Ongoing - met  MONITOR:   TF tolerance, I & O's  REASON FOR ASSESSMENT:   Consult Enteral/tube feeding initiation and management  ASSESSMENT:   Pt with no PMH on file admitted with SAH due to ruptured PCOM aneurysm s/p clipping with clot evacuation and EVD placement.   No meal information in chart.  Per SLP note, trial diet.  Per neurology note waiting for SLP recommendations before deciding if PEG is needed  Pt unable to speak, but nodded that she was feeling better today; pt also reports not being nauseous. RN reports pt ate yogurt and applesauce with encouragement, pt was also able to feed herself a bit today. Pt tolerating TF well @ 50 ml/hr. Pt to be transferred to inpatient rehab.   Medications reviewed: PS 30 ml, free water 400 ml Q6H, heparin, novolog 0-9 units, lantus, lopressor, psyllium, ancef, Osmolite 1.2 - last given 6/3 1759.   Labs reviewed: no new labs.   Net I/O since admit: +1.5 L  Diet Order:   Diet Order           DIET - DYS 1 Room service appropriate? Yes; Fluid consistency: Nectar Thick  Diet effective now          EDUCATION NEEDS:   No education needs have been identified at this time  Skin:  Skin Assessment: Reviewed RN Assessment  Last BM:  09/02/17  Height:   Ht Readings from Last 1 Encounters:  08/14/17 _0  (1.549 m)    Weight:   Wt Readings from Last 1 Encounters:  09/03/17 154 lb 8.7 oz (70.1 kg)    Ideal Body Weight:  47.7 kg  BMI:  Body mass index is  29.2 kg/m.  Estimated Nutritional Needs:   Kcal:  1500-1700  Protein:  80-90 grams  Fluid:  > 1.5 L/day    Hope Budds, Dietetic Intern

## 2017-09-03 NOTE — H&P (Signed)
Physical Medicine and Rehabilitation Admission H&P    Chief Complaint  Patient presents with  . Code Stroke  : HPI: Carla Bauer is a 62 year old right-handed female with history of headaches.  Per chart review, patient lives with her daughter.  Family arranging care as needed on discharge.  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  Presented 08/14/2017 after being found down by her family with right sided weakness.  Cranial CT scan showed left temporal hemorrhage.  Per report 4.2 x 3.4 x 2.1 intraparenchymal hematoma in the medial left temporal lobe with intraventricular penetration filling of the left lateral ventricle with blood.  Mass-effect with right to left shift of 11 mm.  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  Underwent left craniotomy for clipping of aneurysm, evacuation of left temporal hematoma placement of right frontal external ventricular drain 08/14/2017 per Dr. Conchita Paris.  Multiple attempts made to wean her ventricular catheter unsuccessfully and underwent placement of laparoscopic assisted right ventricular peritoneal shunt 08/29/2017 per Dr. Conchita Paris.  Hospital course UTI treated with Rocephin.  Renal ultrasound was completed for UTI showing no hydronephrosis.  Diet has been advanced to a dysphagia #1 nectar thick liquid as well as the nasogastric tube remains in place for nutritional support.  Subcutaneous heparin initiated for DVT prophylaxis 08/19/2017.   Physical and occupational therapy ongoing.  Patient was admitted for a comprehensive rehab program.  Review of Systems  Unable to perform ROS: Acuity of condition   History reviewed. No pertinent past medical history. Past Surgical History:  Procedure Laterality Date  . CESAREAN SECTION    . CRANIOTOMY Left 08/14/2017   Procedure: Craniotomy for Clipping of  Posterior Communicating Artery Aneurysm;  Surgeon: Lisbeth Renshaw, MD;  Location: [facility] OR;  Service: Neurosurgery;  Laterality: Left;  . LAPAROSCOPIC REVISION VENTRICULAR-PERITONEAL (V-P) SHUNT N/A 08/29/2017   Procedure: LAPAROSCOPIC REVISION VENTRICULAR-PERITONEAL (V-P) SHUNT;  Surgeon: Abigail Miyamoto, MD;  Location: [facility] OR;  Service: General;  Laterality: N/A;  LAPAROSCOPIC REVISION VENTRICULAR-PERITONEAL (V-P) SHUNT  . VENTRICULOPERITONEAL SHUNT Right 08/29/2017   Procedure: SHUNT INSERTION VENTRICULAR-PERITONEAL;  Surgeon: Lisbeth Renshaw, MD;  Location: [facility] OR;  Service: Neurosurgery;  Laterality: Right;  SHUNT INSERTION VENTRICULAR-PERITONEAL   History reviewed. No pertinent family history. Social History:  reports that she has never smoked. She does not have any smokeless tobacco history on file. She reports that she does not drink alcohol or use drugs. Allergies: No Known Allergies Medications Prior to Admission  Medication Sig Dispense Refill  . acetaminophen (TYLENOL) 500 MG tablet Take 1 tablet (500 mg total) by mouth every 6 (six) hours as needed. (Patient taking differently: Take 500 mg by mouth every 6 (six) hours as needed for mild pain. ) 30 tablet 0  . Aspirin-Salicylamide-Caffeine (BC HEADACHE POWDER PO) Take 1 packet by mouth daily as needed (headache).      Drug Regimen Review Drug regimen was reviewed and remains appropriate with no significant issues identified  Home: Home Living Family/patient expects to be discharged to:: Private residence Living Arrangements: (lived with daughter, [name] pta) Available Help at Discharge: Family, Available 24 hours/day(daughter, [name], requests pt to live with her) Type of Home: House Home Access: Level entry Home Layout: One level Bathroom Shower/Tub: Tub/shower unit, Engineer, building services: Standard Bathroom Accessibility: Yes Additional Comments: Pt lives with daughter who works 7-3, and her children who are young.  daughter reports that pt will have family or friends assist her at discharge while she is at work, but isn't able to state exactly who that is.  The other daughter became tearful and stated "I need to have a say in what happens... she's my mother, and I am the oldest"  Lives With: Daughter([name])   Functional History: Prior Function Level of Independence: Independent  Functional Status:  Mobility: Bed Mobility Overal bed mobility: Needs Assistance Bed Mobility: Supine to Sit Rolling: Max assist, +2 for physical assistance Sidelying to sit: Max assist Supine to sit: Max assist, +2 for physical assistance Sit to supine: +2 for physical assistance, Max assist General bed mobility comments: improved initiation to take L LE towards EOB with verbal cueing. Able to sit EOB x 5 min with Min A to SUP with improved trunk control and upright posturing Transfers Overall transfer level: Needs assistance Equipment used: 2 person hand held assist Transfer via Lift Equipment: Stedy Transfers: Sit to/from Stand, Pharmacologist Sit to Stand: +2 physical assistance, Mod assist Stand pivot transfers: Max assist, +2 physical assistance Squat pivot transfers: Max assist, +2 physical assistance General transfer comment: improved participation with sit to stand at bedside today with more upright posture, however continued rounded shoulders and forward neck flexion Ambulation/Gait General Gait Details: NT    ADL: ADL Overall ADL's : Needs assistance/impaired Eating/Feeding: NPO Grooming: Wash/dry hands, Wash/dry face, Cueing for sequencing Grooming Details (indicate cue type and reason): total hand over hand assistance Upper Body Bathing: Total assistance Lower Body Bathing: Total assistance Upper Body Dressing : Total assistance Lower Body Dressing: Total assistance Toilet Transfer: Moderate assistance, +2 for physical assistance, +2 for safety/equipment, Stand-pivot Toilet Transfer Details  (indicate cue type and reason): simulated through bed to chair transfer Toileting- Clothing Manipulation and Hygiene: Total assistance, Sit to/from stand Functional mobility during ADLs: Moderate assistance, +2 for physical assistance, +2 for safety/equipment, Cueing for sequencing General ADL Comments: pt transfer from bed to chair simualted toilet transfer.   Cognition: Cognition Overall Cognitive Status: Impaired/Different from baseline Arousal/Alertness: Awake/alert Orientation Level: Oriented to person, Disoriented to place, Disoriented to time, Disoriented to situation Attention: Focused Focused Attention: Impaired Focused Attention Impairment: Verbal basic, Functional basic Cognition Arousal/Alertness: Awake/alert Behavior During Therapy: Flat affect Overall Cognitive Status: Impaired/Different from baseline Area of Impairment: Orientation, Attention, Following commands, Safety/judgement, Problem solving Orientation Level: Disoriented to, Place, Time, Situation Current Attention Level: Focused Following Commands: Follows one step commands inconsistently, Follows one step commands with increased time Safety/Judgement: Decreased awareness of safety, Decreased awareness of deficits Awareness: Intellectual Problem Solving: Slow processing, Decreased initiation, Difficulty sequencing, Requires verbal cues, Requires tactile cues General Comments: improved verablizations during todays session - likely due to daughter being in room. Continues to focus gaze towards L side  Physical Exam: Blood pressure 111/69, pulse 89, temperature 97.6 F (36.4 C), temperature source Oral, resp. rate 16, height 5\' 1"  (1.549 m), weight 70.1 kg (154 lb 8.7 oz), SpO2 100 %. Physical Exam  Constitutional: No distress.  HENT:  Craniotomy site clean and dry.  Nasogastric tube in place  Eyes: Right eye exhibits no discharge. Left eye exhibits no discharge.  Neck: No tracheal deviation present. No  thyromegaly present.  Cardiovascular: Normal rate. Exam reveals no gallop and no friction rub.  No murmur heard. Respiratory: Effort normal. No respiratory distress. She has no wheezes.  GI: Soft. She exhibits no distension. There is no tenderness.  Musculoskeletal: She exhibits no edema.  Neurological:  Patient is  alert and anxious.  Daughter is at bedside.  Patient remained nonverbal throughout exam and very inconsistent to follow commands or focus to tasks. Moves LUE and LLE spontaneously. Does not initiate movement with RUE or RLE. Left gaze preference.   Psychiatric:  Anxious and disengaged    Results for orders placed or performed during the hospital encounter of 08/14/17 (from the past 48 hour(s))  Glucose, capillary     Status: Abnormal   Collection Time: 09/01/17  4:00 PM  Result Value Ref Range   Glucose-Capillary 141 (H) 65 - 99 mg/dL  Glucose, capillary     Status: Abnormal   Collection Time: 09/01/17  7:50 PM  Result Value Ref Range   Glucose-Capillary 120 (H) 65 - 99 mg/dL   Comment 1 Notify RN    Comment 2 Document in Chart   Glucose, capillary     Status: Abnormal   Collection Time: 09/01/17 11:40 PM  Result Value Ref Range   Glucose-Capillary 251 (H) 65 - 99 mg/dL   Comment 1 Notify RN    Comment 2 Document in Chart   Glucose, capillary     Status: Abnormal   Collection Time: 09/02/17  4:19 AM  Result Value Ref Range   Glucose-Capillary 131 (H) 65 - 99 mg/dL  Glucose, capillary     Status: Abnormal   Collection Time: 09/02/17  8:12 AM  Result Value Ref Range   Glucose-Capillary 142 (H) 65 - 99 mg/dL  Glucose, capillary     Status: Abnormal   Collection Time: 09/02/17 12:54 PM  Result Value Ref Range   Glucose-Capillary 130 (H) 65 - 99 mg/dL   Comment 1 Notify RN    Comment 2 Document in Chart   Glucose, capillary     Status: Abnormal   Collection Time: 09/02/17  4:51 PM  Result Value Ref Range   Glucose-Capillary 156 (H) 65 - 99 mg/dL  Glucose,  capillary     Status: Abnormal   Collection Time: 09/02/17  8:07 PM  Result Value Ref Range   Glucose-Capillary 126 (H) 65 - 99 mg/dL   Comment 1 Notify RN    Comment 2 Document in Chart   Glucose, capillary     Status: Abnormal   Collection Time: 09/03/17 12:07 AM  Result Value Ref Range   Glucose-Capillary 145 (H) 65 - 99 mg/dL   Comment 1 Notify RN    Comment 2 Document in Chart   Glucose, capillary     Status: Abnormal   Collection Time: 09/03/17  7:32 AM  Result Value Ref Range   Glucose-Capillary 133 (H) 65 - 99 mg/dL   Comment 1 Notify RN    Comment 2 Document in Chart   Glucose, capillary     Status: Abnormal   Collection Time: 09/03/17 11:34 AM  Result Value Ref Range   Glucose-Capillary 172 (H) 65 - 99 mg/dL   Comment 1 Notify RN    Comment 2 Document in Chart    Dg Swallowing Func-speech Pathology  Result Date: 09/02/2017 Objective Swallowing Evaluation: Type of Study: MBS-Modified Barium Swallow Study  Patient Details Name: MEYLIN STENZEL MRN: [id number] Date of Birth: Jul 08, 1955 Today's Date: 09/02/2017 Time: SLP Start Time (ACUTE ONLY): 1145 -SLP Stop Time (ACUTE ONLY): 1218 SLP Time Calculation (min) (ACUTE ONLY): 33 min Past Medical History: No past medical history on file. Past Surgical History: Past Surgical History: Procedure Laterality Date . CESAREAN SECTION   . CRANIOTOMY Left 08/14/2017  Procedure: Craniotomy for Clipping of  Posterior Communicating Artery Aneurysm;  Surgeon: Lisbeth Renshaw, MD;  Location: [facility] OR;  Service: Neurosurgery;  Laterality: Left; . LAPAROSCOPIC REVISION VENTRICULAR-PERITONEAL (V-P) SHUNT N/A 08/29/2017  Procedure: LAPAROSCOPIC REVISION VENTRICULAR-PERITONEAL (V-P) SHUNT;  Surgeon: Abigail Miyamoto, MD;  Location: [facility] OR;  Service: General;  Laterality: N/A;  LAPAROSCOPIC REVISION VENTRICULAR-PERITONEAL (V-P) SHUNT . VENTRICULOPERITONEAL SHUNT Right 08/29/2017  Procedure: SHUNT INSERTION VENTRICULAR-PERITONEAL;  Surgeon: Lisbeth Renshaw, MD;   Location: [facility] OR;  Service: Neurosurgery;  Laterality: Right;  SHUNT INSERTION VENTRICULAR-PERITONEAL HPI: Ms. TOMIA ENLOW is a 62 y.o. female with no significant history found having fallen out of the bed with R arm weakness and altered mental status. 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 Intubated ETT 5/15- 5/17.  Subjective: Pt in bed, left gaze preference Assessment / Plan / Recommendation CHL IP CLINICAL IMPRESSIONS 09/02/2017 Clinical Impression Pt demonstrates a primary oral dysphagia secondary to decreased initiation and awareness as well as right buccal and labial motor and sensory deficits. When pt is maximally attentive she is able to grasp and cup and take a sip of liquid with min tactile and verbal cues to initiate. Even in best conditions there is some right buccal residual and posterior spillage to pharynx. With thin liquids this leads to intermittent sensed penetration and aspiration before the swallow. Nectar is tolerated even if majority of bolus reaches the pyrforms prior to swallow initiation. As study progressed however pt became less attentive and oral holding increased, particularly with puree. SLP was able to elicit a final swallow with a liquid wash but oral suction was still needed to remove a significant amount of oral residue. Given excellent airway protection with nectar and puree and variable cognitive function will initiate a Dys 1/nectar diet and monitor for intake. Pts automaticity and awareness may improve, or pt may have severe oral holding and limited nutritional support. Will need to trial diet to determine this and follow closely for staff education and tolerance.  SLP Visit Diagnosis Dysphagia, oropharyngeal phase (R13.12) Attention and concentration deficit following Cerebral infarction Frontal lobe and executive function deficit following -- Impact on safety and function Severe aspiration risk   CHL  IP TREATMENT RECOMMENDATION 09/02/2017 Treatment Recommendations Therapy as outlined in treatment plan below   Prognosis 09/02/2017 Prognosis for Safe Diet Advancement Good Barriers to Reach Goals -- Barriers/Prognosis Comment -- CHL IP DIET RECOMMENDATION 09/02/2017 SLP Diet Recommendations Dysphagia 1 (Puree) solids;Nectar thick liquid Liquid Administration via Cup;Spoon Medication Administration Crushed with puree Compensations Slow rate;Small sips/bites;Monitor for anterior loss;Minimize environmental distractions Postural Changes Seated upright at 90 degrees   CHL IP OTHER RECOMMENDATIONS 09/02/2017 Recommended Consults -- Oral Care Recommendations Oral care before and after PO Other Recommendations Have oral suction available;Order thickener from pharmacy   CHL IP FOLLOW UP RECOMMENDATIONS 09/02/2017 Follow up Recommendations Inpatient Rehab   CHL IP FREQUENCY AND DURATION 09/02/2017 Speech Therapy Frequency (ACUTE ONLY) min 2x/week Treatment Duration 2 weeks      CHL IP ORAL PHASE 09/02/2017 Oral Phase Impaired Oral - Pudding Teaspoon -- Oral - Pudding Cup -- Oral - Honey Teaspoon -- Oral - Honey Cup -- Oral - Nectar Teaspoon Lingual pumping;Reduced posterior propulsion;Holding of bolus;Right anterior bolus loss;Pocketing in anterior sulcus;Right pocketing in lateral sulci;Delayed oral transit;Premature spillage Oral - Nectar Cup Lingual pumping;Reduced posterior propulsion;Holding of bolus;Right anterior bolus loss;Pocketing in anterior sulcus;Right pocketing in lateral sulci;Delayed oral transit;Premature spillage Oral - Nectar Straw Lingual pumping;Reduced posterior propulsion;Holding of bolus;Right anterior  bolus loss;Pocketing in anterior sulcus;Right pocketing in lateral sulci;Delayed oral transit;Premature spillage Oral - Thin Teaspoon -- Oral - Thin Cup Lingual pumping;Reduced posterior propulsion;Right anterior bolus loss;Delayed oral transit;Premature spillage Oral - Thin Straw -- Oral - Puree Lingual  pumping;Reduced posterior propulsion;Holding of bolus;Right anterior bolus loss;Pocketing in anterior sulcus;Right pocketing in lateral sulci;Delayed oral transit;Premature spillage Oral - Mech Soft -- Oral - Regular -- Oral - Multi-Consistency -- Oral - Pill -- Oral Phase - Comment --  No flowsheet data found. No flowsheet data found. No flowsheet data found. DeBlois, Riley NearingBonnie Caroline 09/02/2017, 1:21 PM                  Medical Problem List and Plan: 1.  Decreased functional mobility with right-sided weakness secondary to intraparenchymal hematoma in the medial left temporal lobe status post left craniotomy clipping of aneurysm placement of right frontal external ventricular drain 08/14/2017  -admit to inpatient rehab 2.  DVT Prophylaxis/Anticoagulation: Subcutaneous heparin.  Check vascular study 3. Pain Management: Tylenol as needed 4. Mood: Provide emotional support 5. Neuropsych: This patient is not capable of making decisions on her own behalf. 6. Skin/Wound Care: Routine skin checks 7. Fluids/Electrolytes/Nutrition: Routine in and outs with follow-up chemistries  -NGT feedings, adjust rate/H20 as needed 8.  Dysphasia.  Dysphasia #1 nectar liquids.  Nasogastric tube feeds for nutritional support 9.  Hyperglycemia induced from tube feeds.  Continue Lantus insulin for coverage. 10.  Hypertension.  Lopressor 50 mg twice daily.Nimodipine 60 mg every 4 hours x21 days initiated 08/15/2017  Post Admission Physician Evaluation: 1. Functional deficits secondary  to left temporal ICH. 2. Patient is admitted to receive collaborative, interdisciplinary care between the physiatrist, rehab nursing staff, and therapy team. 3. [name] level of medical complexity and substantial therapy needs in context of that medical necessity cannot be provided at a lesser intensity of care such as a SNF. 4. Patient has experienced substantial functional loss from his/her baseline which was documented above under the  "Functional History" and "Functional Status" headings.  Judging by the patient's diagnosis, physical exam, and functional history, the patient has potential for functional progress which will result in measurable gains while on inpatient rehab.  These gains will be of substantial and practical use upon discharge  in facilitating mobility and self-care at the household level. 5. Physiatrist will provide 24 hour management of medical needs as well as oversight of the therapy plan/treatment and provide guidance as appropriate regarding the interaction of the two. 6. The Preadmission Screening has been reviewed and patient status is unchanged unless otherwise stated above. 7. 24 hour rehab nursing will assist with bladder management, bowel management, safety, skin/wound care, disease management, medication administration, pain management and patient education  and help integrate therapy concepts, techniques,education, etc. 8. PT will assess and treat for/with: Lower extremity strength, range of motion, stamina, balance, functional mobility, safety, adaptive techniques and equipment, NMR, cognitive perceptual rx, family education.   Goals are: min assist. 9. OT will assess and treat for/with: ADL's, functional mobility, safety, upper extremity strength, adaptive techniques and equipment, NMR, cognitive-perceptual rx, family ed.   Goals are: min assist. Therapy may not yet proceed with showering this patient. 10. SLP will assess and treat for/with: cognition, swallowing, communication, language, family ed.  Goals are: min assist. 11. Case Management and Social Worker will assess and treat for psychological issues and discharge planning. 12. Team conference will be held weekly to assess progress toward goals and to determine barriers to discharge. 13. Patient  will receive at least 3 hours of therapy per day at least 5 days per week. 14. ELOS: 23-27 days       15. Prognosis:  excellent    I have personally  performed a face to face diagnostic evaluation of this patient and formulated the key components of the plan.  Additionally, I have personally reviewed laboratory data, imaging studies, as well as relevant notes and concur with the physician assistant's documentation above.  Ranelle Oyster, MD, [place] [facility]   Mcarthur Rossetti Angiulli, PA-C 09/03/2017

## 2017-09-03 NOTE — Progress Notes (Signed)
Neurosurgery Progress Note  No issues overnight.  No concerns this am  EXAM:  BP 111/69 (BP Location: Left Arm)   Pulse 89   Temp 97.6 F (36.4 C) (Oral)   Resp 16   Ht 5\' 1"  (1.549 m)   Wt 70.1 kg (154 lb 8.7 oz)   SpO2 100%   BMI 29.20 kg/m   Awake  Non-communicative this am, but family at bedside reports communicating with them earlier. Recognizes family by name Right hemiplegia. Partial RLE flexion with painful stimulus. Follows commands LUE/LLE with encouragement.  PLAN Stable this am. No new NS recs. Dispo planning

## 2017-09-04 ENCOUNTER — Inpatient Hospital Stay (HOSPITAL_COMMUNITY): Payer: Self-pay | Admitting: Occupational Therapy

## 2017-09-04 ENCOUNTER — Inpatient Hospital Stay (HOSPITAL_COMMUNITY): Payer: Self-pay

## 2017-09-04 ENCOUNTER — Inpatient Hospital Stay (HOSPITAL_COMMUNITY): Payer: Self-pay | Admitting: Speech Pathology

## 2017-09-04 DIAGNOSIS — S06353D Traumatic hemorrhage of left cerebrum with loss of consciousness of 1 hours to 5 hours 59 minutes, subsequent encounter: Secondary | ICD-10-CM

## 2017-09-04 DIAGNOSIS — I69120 Aphasia following nontraumatic intracerebral hemorrhage: Secondary | ICD-10-CM

## 2017-09-04 LAB — COMPREHENSIVE METABOLIC PANEL
ALT: 24 U/L (ref 14–54)
ANION GAP: 10 (ref 5–15)
AST: 26 U/L (ref 15–41)
Albumin: 2.9 g/dL — ABNORMAL LOW (ref 3.5–5.0)
Alkaline Phosphatase: 113 U/L (ref 38–126)
BUN: 11 mg/dL (ref 6–20)
CO2: 30 mmol/L (ref 22–32)
CREATININE: 0.49 mg/dL (ref 0.44–1.00)
Calcium: 9.6 mg/dL (ref 8.9–10.3)
Chloride: 105 mmol/L (ref 101–111)
Glucose, Bld: 150 mg/dL — ABNORMAL HIGH (ref 65–99)
POTASSIUM: 3.1 mmol/L — AB (ref 3.5–5.1)
Sodium: 145 mmol/L (ref 135–145)
Total Bilirubin: 0.3 mg/dL (ref 0.3–1.2)
Total Protein: 6.6 g/dL (ref 6.5–8.1)

## 2017-09-04 LAB — CBC WITH DIFFERENTIAL/PLATELET
Abs Immature Granulocytes: 0 10*3/uL (ref 0.0–0.1)
BASOS ABS: 0 10*3/uL (ref 0.0–0.1)
BASOS PCT: 1 %
EOS ABS: 0.2 10*3/uL (ref 0.0–0.7)
Eosinophils Relative: 2 %
HCT: 29.1 % — ABNORMAL LOW (ref 36.0–46.0)
Hemoglobin: 9.2 g/dL — ABNORMAL LOW (ref 12.0–15.0)
Immature Granulocytes: 0 %
Lymphocytes Relative: 33 %
Lymphs Abs: 2.9 10*3/uL (ref 0.7–4.0)
MCH: 29.5 pg (ref 26.0–34.0)
MCHC: 31.6 g/dL (ref 30.0–36.0)
MCV: 93.3 fL (ref 78.0–100.0)
MONO ABS: 0.4 10*3/uL (ref 0.1–1.0)
MONOS PCT: 5 %
NEUTROS PCT: 59 %
Neutro Abs: 5.3 10*3/uL (ref 1.7–7.7)
Platelets: 370 10*3/uL (ref 150–400)
RBC: 3.12 MIL/uL — ABNORMAL LOW (ref 3.87–5.11)
RDW: 15.7 % — AB (ref 11.5–15.5)
WBC: 8.8 10*3/uL (ref 4.0–10.5)

## 2017-09-04 LAB — GLUCOSE, CAPILLARY
GLUCOSE-CAPILLARY: 115 mg/dL — AB (ref 65–99)
GLUCOSE-CAPILLARY: 132 mg/dL — AB (ref 65–99)
GLUCOSE-CAPILLARY: 166 mg/dL — AB (ref 65–99)
Glucose-Capillary: 130 mg/dL — ABNORMAL HIGH (ref 65–99)
Glucose-Capillary: 150 mg/dL — ABNORMAL HIGH (ref 65–99)
Glucose-Capillary: 187 mg/dL — ABNORMAL HIGH (ref 65–99)

## 2017-09-04 MED ORDER — ORAL CARE MOUTH RINSE
15.0000 mL | Freq: Two times a day (BID) | OROMUCOSAL | Status: DC
  Administered 2017-09-04 – 2017-09-26 (×26): 15 mL via OROMUCOSAL

## 2017-09-04 MED ORDER — POTASSIUM CHLORIDE CRYS ER 20 MEQ PO TBCR
20.0000 meq | EXTENDED_RELEASE_TABLET | Freq: Every day | ORAL | Status: AC
  Administered 2017-09-04 – 2017-09-06 (×3): 20 meq via ORAL
  Filled 2017-09-04 (×3): qty 1

## 2017-09-04 MED ORDER — CHLORHEXIDINE GLUCONATE 0.12 % MT SOLN
15.0000 mL | Freq: Two times a day (BID) | OROMUCOSAL | Status: DC
  Administered 2017-09-04 – 2017-09-27 (×34): 15 mL via OROMUCOSAL
  Filled 2017-09-04 (×41): qty 15

## 2017-09-04 MED ORDER — OSMOLITE 1.5 CAL PO LIQD
1000.0000 mL | ORAL | Status: DC
  Administered 2017-09-04 – 2017-09-10 (×7): 1000 mL
  Filled 2017-09-04 (×13): qty 1000

## 2017-09-04 NOTE — Evaluation (Signed)
Physical Therapy Assessment and Plan  Patient Details  Name: Carla Bauer MRN: [id number] Date of Birth: [birth year]  PT Diagnosis: Abnormal posture, Cognitive deficits, Coordination disorder, Hemiparesis dominant and Impaired cognition Rehab Potential: Fair ELOS: 4 weeks   Today's Date: 09/04/2017 PT Individual Time: 3785-8850 PT Individual Time Calculation (min): 57 min    Problem List:  Patient Active Problem List   Diagnosis Date Noted  . Intraparenchymal hematoma of brain ([place]) 09/03/2017  . Right hemiparesis ([place])   . Oropharyngeal dysphagia   . Essential hypertension   . Acute lower UTI   . Tachypnea   . Tachycardia   . Reactive hypertension   . Prediabetes   . Acute blood loss anemia   . Hypokalemia   . Fever   . Leukocytosis   . Acute respiratory failure with hypoxia ([place])   . Cerebrovascular accident (CVA) due to thrombosis of left posterior cerebral artery ([place])   . ICH (intracerebral hemorrhage) ([place]) 08/14/2017  . SAH (subarachnoid hemorrhage) ([place]) 08/14/2017  . 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 Hydrocephalus 08/14/2017  . Brain herniation ([place]) 08/14/2017  . Cytotoxic brain edema ([place]) 08/14/2017    Past Medical History: History reviewed. No pertinent past medical history. Past Surgical History:  Past Surgical History:  Procedure Laterality Date  . CESAREAN SECTION    . CRANIOTOMY Left 08/14/2017   Procedure: 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;  Surgeon: Consuella Lose, MD;  Location: [facility] [facility];  Service: Neurosurgery;  Laterality: Left;  . LAPAROSCOPIC REVISION VENTRICULAR-PERITONEAL (V-P) SHUNT N/A 08/29/2017   Procedure: LAPAROSCOPIC REVISION VENTRICULAR-PERITONEAL (V-P) SHUNT;  Surgeon: Coralie Keens, MD;  Location: [place];  Service: General;  Laterality: N/A;  LAPAROSCOPIC REVISION VENTRICULAR-PERITONEAL (V-P) SHUNT  . VENTRICULOPERITONEAL SHUNT Right  08/29/2017   Procedure: SHUNT INSERTION VENTRICULAR-PERITONEAL;  Surgeon: Consuella Lose, MD;  Location: [place];  Service: Neurosurgery;  Laterality: Right;  SHUNT INSERTION VENTRICULAR-PERITONEAL    Assessment & Plan Clinical Impression: Patient is a 62 y.o. year old female with history of headaches. Per chart review, patient lives with her daughter. Family arranging care as needed on discharge. 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 Presented 08/14/2017 after being found down by her family with right sided weakness. Cranial CT scan showed left temporal hemorrhage. Per report 4.2 x 3.4 x 2.1 intraparenchymal hematoma in the medial left temporal lobe with intraventricular penetration filling of the left lateral ventricle with blood. Mass-effect with right to left shift of 11 mm. 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 Underwent left craniotomy for clipping of aneurysm, evacuation of left temporal hematoma placement of right frontal external ventricular drain 08/14/2017 per Dr. Kathyrn Sheriff.Multiple attempts made to wean her ventricular catheter unsuccessfully and underwent placement of laparoscopic assisted right ventricular peritoneal shunt 08/29/2017 per Dr. Kathyrn Sheriff. Hospital course UTI treated with Rocephin. Renal ultrasound was completed for UTI showing no hydronephrosis. Diet has been advanced to a dysphagia #1 nectar thick liquid as well as the nasogastric tube remains in place for nutritional support. Subcutaneous heparin initiated for DVT prophylaxis 08/19/2017. Physical and occupational therapy ongoing. Patient was admitted for a comprehensive rehab program. Patient transferred to CIR on 09/03/2017 .   Patient currently requires max with mobility secondary to muscle weakness, impaired timing and sequencing, decreased  coordination and decreased motor planning, decreased initiation, decreased attention and decreased  safety awareness and decreased sitting balance, decreased standing balance, decreased postural control, hemiplegia and decreased balance strategies.  Prior to hospitalization, patient was independent  with mobility and lived with Daughter in a House home.  Home access is  Level entry.  Patient will benefit from skilled PT intervention to maximize safe functional mobility, minimize fall risk and decrease caregiver burden for planned discharge home with 24 hour assist.  Anticipate patient will benefit from follow up [facility] at discharge.  PT - End of Session Activity Tolerance: Tolerates 30+ min activity with multiple rests Endurance Deficit: Yes Endurance Deficit Description: Multiple rest breaks within BADL tasks PT Assessment Rehab Potential (ACUTE/IP ONLY): Fair PT Barriers to Discharge: Decreased caregiver support;Behavior PT Patient demonstrates impairments in the following area(s): Balance;Safety;Behavior;Sensory;Endurance;Motor;Nutrition;Pain;Perception PT Transfers Functional Problem(s): Bed Mobility;Bed to Chair;Car;Furniture;Floor PT Locomotion Functional Problem(s): Ambulation;Wheelchair Mobility;Stairs PT Plan PT Intensity: Minimum of 1-2 x/day ,45 to 90 minutes PT Frequency: 5 out of 7 days PT Duration Estimated Length of Stay: 4 weeks PT Treatment/Interventions: Ambulation/gait training;Community reintegration;DME/adaptive equipment instruction;Neuromuscular re-education;Stair training;UE/LE Strength taining/ROM;Wheelchair propulsion/positioning;Balance/vestibular training;Discharge planning;Functional electrical stimulation;Pain management;Skin care/wound management;Therapeutic Activities;UE/LE Coordination activities;Cognitive remediation/compensation;Disease management/prevention;Functional mobility training;Patient/family education;Splinting/orthotics;Therapeutic Exercise PT Transfers  Anticipated Outcome(s): min assist PT Locomotion Anticipated Outcome(s): mod assist PT Recommendation Follow Up Recommendations: Home health PT;24 hour supervision/assistance Patient destination: Home Equipment Recommended: To be determined  Skilled Therapeutic Intervention Evaluation completed (see details above and below) with education on PT POC and goals and individual treatment initiated with focus on bed mobility, transfers and sitting balance. Pt supine in bed upon PT arrival, incontinent of bowel and bladder. Pt performed rolling to the L with max assist and rolling to R x 3 with total assist in order to change briefs, clean peri area and don clean clothes, total assist. Pt transferred from supine>sitting EOB with total assist, pt requiring mod-max assist for sitting balance secondary to the pt pushing R with L UE. Therapist assist pt to sitting<>sidelying propped on L elbow position x 3, once in propped position pt able to maintain position with SBA. Pt transitioned back to sitting and now able to maintain sitting balance min assist. Pt performed stand pivot transfer from bed>TIS w/c with max assist +2 for safety. Pt transported to gym in w/c. Pt performed sit<>stand at L handrail with max assist, therapist blocking R LE. Pt transported back to room and left seated in TIS with QRB in place and chair alarm set.    PT Evaluation Precautions/Restrictions Precautions Precautions: Fall Restrictions Weight Bearing Restrictions: No General   Vital Signs Pain Pain Assessment Pain Scale: Faces Faces Pain Scale: No hurt Home Living/Prior Functioning Home Living Living Arrangements: Children Available Help at Discharge: Family;Available 24 hours/day Type of Home: House Home Access: Level entry Home Layout: One level Bathroom Shower/Tub: Tub/shower unit;Curtain Bathroom Toilet: Standard Bathroom Accessibility: Yes Additional Comments: All information taken from chart review  Lives  With: Daughter Prior Function Level of Independence: Independent with basic ADLs;Independent with gait(per chart review)  Able to Take Stairs?: Yes Vocation: Full time employment Vocation Requirements: CNA(per chart review) Vision/Perception  Vision - Assessment Additional Comments: Unable to follow commands for formal assessment, L gaze preference Perception Perception: Impaired Inattention/Neglect: Does not attend to right visual field;Does not attend to right side of body Praxis Praxis: Impaired Praxis Impairment Details: Motor planning;Ideomotor;Initiation  Cognition Overall Cognitive Status: Impaired/Different from baseline Arousal/Alertness: Awake/alert Orientation Level: Oriented to person Attention: Focused Focused Attention: Impaired Focused Attention Impairment: Verbal basic;Functional basic Memory: Impaired Awareness: Impaired Problem  Solving: Impaired Executive Function: (all impaired due to lower level underlying deficits ) Behaviors: Restless Safety/Judgment: Impaired Comments: right neglect Sensation Sensation Additional Comments: Unable to formally test 2/2 cognition Coordination Gross Motor Movements are Fluid and Coordinated: No Fine Motor Movements are Fluid and Coordinated: No Coordination and Movement Description: Flaccid R hemiplegia Finger Nose Finger Test: UTA Motor  Motor Motor: Hemiplegia;Motor apraxia;Abnormal postural alignment and control;Abnormal tone Motor - Skilled Clinical Observations: R hemiplegia  Mobility Bed Mobility Bed Mobility: Rolling Right;Rolling Left;Supine to Sit;Sit to Supine Rolling Right: Total Assistance - Patient < 25% Rolling Left: Maximal Assistance - Patient 25-49% Supine to Sit: Total Assistance - Patient < 25% Sit to Supine: Total Assistance - Patient < 25% Transfers Transfers: Stand Pivot Transfers Sit to Stand: Maximal Assistance - Patient 25-49%(at rail) Stand Pivot Transfers: 2 Helpers Locomotion   Gait Gait: No Stairs / Additional Locomotion Stairs: No Wheelchair Mobility Wheelchair Mobility: No Wheelchair Parts Management: Needs assistance  Trunk/Postural Assessment  Cervical Assessment Cervical Assessment: Exceptions to WFL(left cervial rotation) Thoracic Assessment Thoracic Assessment: Exceptions to WFL(rounded shoulders) Lumbar Assessment Lumbar Assessment: Exceptions to WFL(posterior pelvic tilt) Postural Control Postural Control: Deficits on evaluation Trunk Control: impaired Protective Responses: impaired  Balance Balance Balance Assessed: Yes Static Sitting Balance Static Sitting - Balance Support: Feet supported;Left upper extremity supported Static Sitting - Level of Assistance: 2: Max assist Dynamic Sitting Balance Dynamic Sitting - Balance Support: Feet supported;No upper extremity supported;During functional activity Dynamic Sitting - Level of Assistance: 2: Max assist Static Standing Balance Static Standing - Level of Assistance: 2: Max assist Dynamic Standing Balance Dynamic Standing - Level of Assistance: 1: +2 Total assist Extremity Assessment  RUE Assessment RUE Assessment: Exceptions to [facility] Active Range of Motion (AROM) Comments: 0 General Strength Comments: Flaccid R UE RUE Body System: Neuro Brunstrum levels for arm and hand: Arm;Hand Brunstrum level for arm: Stage I Presynergy Brunstrum level for hand: Stage I Flaccidity LUE Assessment LUE Assessment: Within Functional Limits RLE Assessment RLE Assessment: Exceptions to [facility] General Strength Comments: 0/5 throughout, unable to elicit muscle activation in LE(difficult to formally assess secondary to decreased ability to folllow commands) LLE Assessment LLE Assessment: Exceptions to [facility] General Strength Comments: grossly 3+ to 4/5 throughout seen functional through spontaneous movements and sit<>stand(difficult to formally assess secondary to decreased ability to folllow commands)   See  Function Navigator for Current Functional Status.   Refer to Care Plan for Long Term Goals  Recommendations for other services: None   Discharge Criteria: Patient will be discharged from PT if patient refuses treatment 3 consecutive times without medical reason, if treatment goals not met, if there is a change in medical status, if patient makes no progress towards goals or if patient is discharged from hospital.  The above assessment, treatment plan, treatment alternatives and goals were discussed and mutually agreed upon: No family available/patient unable  Netta Corrigan, PT, DPT 09/04/2017, 3:43 PM

## 2017-09-04 NOTE — Progress Notes (Signed)
Occupational Therapy Session Note  Patient Details  Name: Carla Bauer MRN: [id number] Date of Birth: April 15, 1955  Today's Date: 09/04/2017 OT Individual Time: 1415-1445 OT Individual Time Calculation (min): 30 min    Short Term Goals: Week 1:  OT Short Term Goal 1 (Week 1): Pt will sit EOB with no more than mod A while participating in BADL task OT Short Term Goal 2 (Week 1): Pt will locate 1/4 grooming items at the sink with mod instructional cues OT Short Term Goal 3 (Week 1): Pt will complete 2/10 bathing steps to decrease caregiver burder. OT Short Term Goal 4 (Week 1): Pt will complete toilet transfer with max +2 assist  Skilled Therapeutic Interventions/Progress Updates:  Balance/vestibular training;Cognitive remediation/compensation;Community reintegration;Discharge planning;DME/adaptive equipment instruction;Functional electrical stimulation;Functional mobility training;Neuromuscular re-education;Patient/family education;Psychosocial support;Self Care/advanced ADL retraining;Splinting/orthotics;Therapeutic Activities;Therapeutic Exercise;UE/LE Strength taining/ROM;UE/LE Coordination activities;Visual/perceptual remediation/compensation;Wheelchair propulsion/positioning   1:1 Engaged pt with focus on focused to sustained attention to different stimuli. Pt would respond verbally to her name. Pt not oriented to situation or place. Pt resistant to any movement this afternoon; however with much encouragement and trials able to perform sit to stand with STEDY with max A. However when sitting on perched seat of STEDY, pt does demonstrate pushing behaviors. Returned to room and left in tilt and space w/c with safety belt.   Therapy Documentation Precautions:  Precautions Precautions: Fall Restrictions Weight Bearing Restrictions: No Pain: Pain Assessment Pain Scale: Faces Faces Pain Scale: No hurt ADL: ADL ADL Comments: Please see functional navigator Vision Vision Assessment?:  Vision impaired- to be further tested in functional context Additional Comments: Unable to follow commands for formal assessment, L gaze preference Perception  Perception: Impaired Inattention/Neglect: Does not attend to right visual field;Does not attend to right side of body Praxis Praxis: Impaired Praxis Impairment Details: Motor planning;Ideomotor;Initiation  See Function Navigator for Current Functional Status.   Therapy/Group: Individual Therapy  Roney MansSmith, Semisi Biela [facility] 09/04/2017, 3:02 PM

## 2017-09-04 NOTE — Evaluation (Signed)
Occupational Therapy Assessment and Plan  Patient Details  Name: Carla Bauer MRN: [id number] Date of Birth: 1955/11/30  OT Diagnosis: apraxia, ataxia, cognitive deficits, flaccid hemiplegia and hemiparesis and muscle weakness (generalized) Rehab Potential: Rehab Potential (ACUTE ONLY): Good ELOS: 4-5 weeks   Today's Date: 09/04/2017 OT Individual Time: 0803-0900 OT Individual Time Calculation (min): 57 min     Problem List:  Patient Active Problem List   Diagnosis Date Noted  . Intraparenchymal hematoma of brain ([place]) 09/03/2017  . Right hemiparesis ([place])   . Oropharyngeal dysphagia   . Essential hypertension   . Acute lower UTI   . Tachypnea   . Tachycardia   . Reactive hypertension   . Prediabetes   . Acute blood loss anemia   . Hypokalemia   . Fever   . Leukocytosis   . Acute respiratory failure with hypoxia ([place])   . Cerebrovascular accident (CVA) due to thrombosis of left posterior cerebral artery ([place])   . ICH (intracerebral hemorrhage) ([place]) 08/14/2017  . SAH (subarachnoid hemorrhage) ([place]) 08/14/2017  . 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 Hydrocephalus 08/14/2017  . Brain herniation ([place]) 08/14/2017  . Cytotoxic brain edema ([place]) 08/14/2017    Past Medical History: History reviewed. No pertinent past medical history. Past Surgical History:  Past Surgical History:  Procedure Laterality Date  . CESAREAN SECTION    . CRANIOTOMY Left 08/14/2017   Procedure: 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;  Surgeon: Consuella Lose, MD;  Location: [facility];  Service: Neurosurgery;  Laterality: Left;  . LAPAROSCOPIC REVISION VENTRICULAR-PERITONEAL (V-P) SHUNT N/A 08/29/2017   Procedure: LAPAROSCOPIC REVISION VENTRICULAR-PERITONEAL (V-P) SHUNT;  Surgeon: Coralie Keens, MD;  Location: [facility];  Service: General;  Laterality: N/A;  LAPAROSCOPIC REVISION VENTRICULAR-PERITONEAL (V-P) SHUNT  .  VENTRICULOPERITONEAL SHUNT Right 08/29/2017   Procedure: SHUNT INSERTION VENTRICULAR-PERITONEAL;  Surgeon: Consuella Lose, MD;  Location: [facility];  Service: Neurosurgery;  Laterality: Right;  SHUNT INSERTION VENTRICULAR-PERITONEAL    Assessment & Plan Clinical Impression: Patient is a 62 y.o. year old female with recent admission to the hospital on 08/14/2017 after being found down by her family with right sided weakness.  Cranial CT scan showed left temporal hemorrhage.  Per report 4.2 x 3.4 x 2.1 intraparenchymal hematoma in the medial left temporal lobe with intraventricular penetration filling of the left lateral ventricle with blood.  Mass-effect with right to left shift of 11 mm.  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  Underwent left craniotomy for clipping of aneurysm, evacuation of left temporal hematoma placement of right frontal external ventricular drain 08/14/2017 per Dr. Kathyrn Sheriff.  Hospital course UTI treated with Rocephin.  Renal ultrasound was completed for UTI showing no hydronephrosis.  Diet has been advanced to a dysphagia #1 nectar thick liquid as well as the nasogastric tube remains in place for nutritional support. Patient transferred to CIR on 09/03/2017 .    Patient currently requires total with basic self-care skills secondary to muscle weakness, impaired timing and sequencing, abnormal tone, unbalanced muscle activation, motor apraxia, ataxia, decreased coordination and decreased motor planning, decreased midline orientation, decreased attention to right, right side neglect, decreased motor planning and ideational apraxia, decreased initiation, decreased attention, decreased awareness, decreased problem solving, decreased safety awareness, decreased memory and delayed processing and decreased sitting balance, decreased standing balance,  decreased postural control, hemiplegia and  decreased balance strategies.  Prior to hospitalization, patient could complete BADL with independent .  Patient will benefit from skilled intervention to decrease level of assist with basic self-care skills prior to discharge home with care partner.  Anticipate patient will require minimal physical assistance and follow up home health.  OT - End of Session Endurance Deficit: Yes Endurance Deficit Description: Multiple rest breaks within BADL tasks OT Assessment Rehab Potential (ACUTE ONLY): Good OT Patient demonstrates impairments in the following area(s): Balance;Cognition;Endurance;Motor;Nutrition;Perception;Safety;Sensory;Vision OT Basic ADL's Functional Problem(s): Eating;Grooming;Bathing;Toileting;Dressing OT Transfers Functional Problem(s): Tub/Shower;Toilet OT Additional Impairment(s): Fuctional Use of Upper Extremity OT Plan OT Intensity: Minimum of 1-2 x/day, 45 to 90 minutes OT Frequency: 5 out of 7 days OT Duration/Estimated Length of Stay: 4-5 weeks OT Treatment/Interventions: Balance/vestibular training;Cognitive remediation/compensation;Community reintegration;Discharge planning;DME/adaptive equipment instruction;Functional electrical stimulation;Functional mobility training;Neuromuscular re-education;Patient/family education;Psychosocial support;Self Care/advanced ADL retraining;Splinting/orthotics;Therapeutic Activities;Therapeutic Exercise;UE/LE Strength taining/ROM;UE/LE Coordination activities;Visual/perceptual remediation/compensation;Wheelchair propulsion/positioning OT Self Feeding Anticipated Outcome(s): Min A OT Basic Self-Care Anticipated Outcome(s): Min A OT Toileting Anticipated Outcome(s): Min A OT Bathroom Transfers Anticipated Outcome(s): Min A OT Recommendation Patient destination: Home Follow Up Recommendations: Home health OT Equipment Recommended: To be determined   Skilled Therapeutic Intervention Initial eval  completed with treatment provided to address functional use of R UE, improved sit<>stand, standing tolerance, L attention, and adapted bathing/dressing skills. Pt came to sitting EOB with total A and did not follow commands to initiate sitting despite max multimodal cues. Pt with L gaze preference throughout session and unable to get her to look to midline despite max cues. Bathing completed seated EOB with pt needing max A/total A to  maintain sitting balance without holding onto bed rail with to wash her face. Pt unable to then follow commands to move on to wash chest/abdomen. Sit<>stand from raised bed using [place] with max A. Max multimodal cues to weight shift to the L and decrease pushing, but pt unable to follow commands. Pt noted to be incontinent of bowel and bladder. Max/Total A +2 for peri-care and brief change with sit<>stands in [place]. UB and LB dressing completed seated EOB with total A. Pt returned to bed at end of session and left semi-reclined with R hemi UE supported on pillow, bed alarm on, and needs met.   OT Evaluation Precautions/Restrictions  Pain Pain Assessment Pain Scale: Faces Faces Pain Scale: No hurt Home Living/Prior Functioning Home Living Family/patient expects to be discharged to:: Private residence Living Arrangements: Children Available Help at Discharge: Family, Available 24 hours/day Type of Home: House Home Access: Level entry Home Layout: One level Bathroom Shower/Tub: Tub/shower unit, Architectural technologist: Standard Bathroom Accessibility: Yes Additional Comments: All information taken from chart review  Lives With: Daughter Prior Function Vocation: Full time employment ADL ADL ADL Comments: Please see functional navigator Vision Vision Assessment?: Vision impaired- to be further tested in functional context Additional Comments: Unable to follow commands for formal assessment, L gaze preference Perception  Perception:  Impaired Inattention/Neglect: Does not attend to right visual field;Does not attend to right side of body Praxis Praxis: Impaired Praxis Impairment Details: Motor planning;Ideomotor;Initiation Cognition Overall Cognitive Status: Impaired/Different from baseline Arousal/Alertness: Awake/alert Orientation Level: Person;Place;Situation Person: Disoriented Place: Disoriented Situation: Disoriented Year: Other (Comment)(unable to state year) Month: (Unable to follow commands) Day of Week: Other (Comment)(Unable to follow commands) Memory: Impaired Immediate Memory Recall: (Unable to follow commands to particiapte in BIMS) Attention: Focused Focused Attention: Impaired Focused Attention Impairment: Verbal basic;Functional basic Awareness: Impaired Problem Solving: Impaired Executive Function: (all impaired due to  lower level underlying deficits ) Behaviors: Restless Safety/Judgment: Impaired Comments: right neglect Sensation Sensation Additional Comments: Unable to formally test 2/2 cognition Coordination Gross Motor Movements are Fluid and Coordinated: No Fine Motor Movements are Fluid and Coordinated: No Coordination and Movement Description: Flaccid R hemiplegia Finger Nose Finger Test: UTA Motor  Motor Motor: Hemiplegia;Motor apraxia;Abnormal postural alignment and control;Abnormal tone Motor - Skilled Clinical Observations: R hemiplegia Balance Balance Balance Assessed: Yes Static Sitting Balance Static Sitting - Balance Support: Feet supported;Left upper extremity supported Static Sitting - Level of Assistance: 2: Max assist Dynamic Sitting Balance Dynamic Sitting - Balance Support: Feet supported;No upper extremity supported;During functional activity Dynamic Sitting - Level of Assistance: 1: +1 Total assist Extremity/Trunk Assessment RUE Assessment RUE Assessment: Exceptions to [facility] Active Range of Motion (AROM) Comments: 0 General Strength Comments: Flaccid R  UE RUE Body System: Neuro Brunstrum levels for arm and hand: Arm;Hand Brunstrum level for arm: Stage I Presynergy Brunstrum level for hand: Stage I Flaccidity LUE Assessment LUE Assessment: Within Functional Limits   See Function Navigator for Current Functional Status.   Refer to Care Plan for Long Term Goals  Recommendations for other services: None    Discharge Criteria: Patient will be discharged from OT if patient refuses treatment 3 consecutive times without medical reason, if treatment goals not met, if there is a change in medical status, if patient makes no progress towards goals or if patient is discharged from hospital.  The above assessment, treatment plan, treatment alternatives and goals were discussed and mutually agreed upon: No family available/patient unable  Valma Cava 09/04/2017, 1:33 PM

## 2017-09-04 NOTE — Progress Notes (Signed)
Subjective/Complaints: Aphasic, able to state name  "Carla Bauer" ROS- neg CP, SOB.N/V/D Objective: Vital Signs: Blood pressure 106/82, pulse 91, temperature (!) 97.5 F (36.4 C), temperature source Oral, resp. rate 18, height 5' 1"  (1.549 m), weight 70.1 kg (154 lb 9.4 oz), SpO2 100 %. Dg Swallowing Func-speech Pathology  Result Date: 09/02/2017 Objective Swallowing Evaluation: Type of Study: MBS-Modified Barium Swallow Study  Patient Details Name: Carla Bauer MRN: [id number] Date of Birth: 04/26/1955 Today's Date: 09/02/2017 Time: SLP Start Time (ACUTE ONLY): 1145 -SLP Stop Time (ACUTE ONLY): 1218 SLP Time Calculation (min) (ACUTE ONLY): 33 min Past Medical History: No past medical history on file. Past Surgical History: Past Surgical History: Procedure Laterality Date . CESAREAN SECTION   . CRANIOTOMY Left 08/14/2017  Procedure: 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;  Surgeon: Consuella Lose, MD;  Location: [facility];  Service: Neurosurgery;  Laterality: Left; . LAPAROSCOPIC REVISION VENTRICULAR-PERITONEAL (V-P) SHUNT N/A 08/29/2017  Procedure: LAPAROSCOPIC REVISION VENTRICULAR-PERITONEAL (V-P) SHUNT;  Surgeon: Coralie Keens, MD;  Location: [facility];  Service: General;  Laterality: N/A;  LAPAROSCOPIC REVISION VENTRICULAR-PERITONEAL (V-P) SHUNT . VENTRICULOPERITONEAL SHUNT Right 08/29/2017  Procedure: SHUNT INSERTION VENTRICULAR-PERITONEAL;  Surgeon: Consuella Lose, MD;  Location: [facility];  Service: Neurosurgery;  Laterality: Right;  SHUNT INSERTION VENTRICULAR-PERITONEAL HPI: Carla Bauer is a 62 y.o. female with no significant history found having fallen out of the bed with R arm weakness and altered mental status. 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 Intubated ETT 5/15- 5/17.  Subjective: Pt in bed, left gaze preference Assessment / Plan / Recommendation CHL IP CLINICAL IMPRESSIONS 09/02/2017  Clinical Impression Pt demonstrates a primary oral dysphagia secondary to decreased initiation and awareness as well as right buccal and labial motor and sensory deficits. When pt is maximally attentive she is able to grasp and cup and take a sip of liquid with min tactile and verbal cues to initiate. Even in best conditions there is some right buccal residual and posterior spillage to pharynx. With thin liquids this leads to intermittent sensed penetration and aspiration before the swallow. Nectar is tolerated even if majority of bolus reaches the pyrforms prior to swallow initiation. As study progressed however pt became less attentive and oral holding increased, particularly with puree. SLP was able to elicit a final swallow with a liquid wash but oral suction was still needed to remove a significant amount of oral residue. Given excellent airway protection with nectar and puree and variable cognitive function will initiate a Dys 1/nectar diet and monitor for intake. Pts automaticity and awareness may improve, or pt may have severe oral holding and limited nutritional support. Will need to trial diet to determine this and follow closely for staff education and tolerance.  SLP Visit Diagnosis Dysphagia, oropharyngeal phase (R13.12) Attention and concentration deficit following Cerebral infarction Frontal lobe and executive function deficit following -- Impact on safety and function Severe aspiration risk   CHL IP TREATMENT RECOMMENDATION 09/02/2017 Treatment Recommendations Therapy as outlined in treatment plan below   Prognosis 09/02/2017 Prognosis for Safe Diet Advancement Good Barriers to Reach Goals -- Barriers/Prognosis Comment -- CHL IP DIET RECOMMENDATION 09/02/2017 SLP Diet Recommendations Dysphagia 1 (Puree) solids;Nectar thick liquid Liquid Administration via Cup;Spoon Medication Administration Crushed with puree Compensations Slow rate;Small sips/bites;Monitor for anterior loss;Minimize environmental  distractions Postural Changes Seated upright at 90 degrees   CHL IP OTHER RECOMMENDATIONS 09/02/2017 Recommended Consults -- Oral Care Recommendations Oral care  before and after PO Other Recommendations Have oral suction available;Order thickener from pharmacy   CHL IP FOLLOW UP RECOMMENDATIONS 09/02/2017 Follow up Recommendations Inpatient Rehab   CHL IP FREQUENCY AND DURATION 09/02/2017 Speech Therapy Frequency (ACUTE ONLY) min 2x/week Treatment Duration 2 weeks      CHL IP ORAL PHASE 09/02/2017 Oral Phase Impaired Oral - Pudding Teaspoon -- Oral - Pudding Cup -- Oral - Honey Teaspoon -- Oral - Honey Cup -- Oral - Nectar Teaspoon Lingual pumping;Reduced posterior propulsion;Holding of bolus;Right anterior bolus loss;Pocketing in anterior sulcus;Right pocketing in lateral sulci;Delayed oral transit;Premature spillage Oral - Nectar Cup Lingual pumping;Reduced posterior propulsion;Holding of bolus;Right anterior bolus loss;Pocketing in anterior sulcus;Right pocketing in lateral sulci;Delayed oral transit;Premature spillage Oral - Nectar Straw Lingual pumping;Reduced posterior propulsion;Holding of bolus;Right anterior bolus loss;Pocketing in anterior sulcus;Right pocketing in lateral sulci;Delayed oral transit;Premature spillage Oral - Thin Teaspoon -- Oral - Thin Cup Lingual pumping;Reduced posterior propulsion;Right anterior bolus loss;Delayed oral transit;Premature spillage Oral - Thin Straw -- Oral - Puree Lingual pumping;Reduced posterior propulsion;Holding of bolus;Right anterior bolus loss;Pocketing in anterior sulcus;Right pocketing in lateral sulci;Delayed oral transit;Premature spillage Oral - Mech Soft -- Oral - Regular -- Oral - Multi-Consistency -- Oral - Pill -- Oral Phase - Comment --  No flowsheet data found. No flowsheet data found. No flowsheet data found. DeBlois, Katherene Ponto 09/02/2017, 1:21 PM              Results for orders placed or performed during the hospital encounter of 09/03/17 (from the past  72 hour(s))  CBC     Status: Abnormal   Collection Time: 09/03/17  4:09 PM  Result Value Ref Range   WBC 9.7 4.0 - 10.5 K/uL   RBC 3.33 (L) 3.87 - 5.11 MIL/uL   Hemoglobin 9.9 (L) 12.0 - 15.0 g/dL   HCT 31.6 (L) 36.0 - 46.0 %   MCV 94.9 78.0 - 100.0 fL   MCH 29.7 26.0 - 34.0 pg   MCHC 31.3 30.0 - 36.0 g/dL   RDW 15.9 (H) 11.5 - 15.5 %   Platelets 376 150 - 400 K/uL    Comment: Performed at [facility], [place] [address] [place], [place] [address]  Creatinine, serum     Status: None   Collection Time: 09/03/17  4:09 PM  Result Value Ref Range   Creatinine, Ser 0.50 0.44 - 1.00 mg/dL   GFR calc non Af Amer >60 >60 mL/min   GFR calc Af Amer >60 >60 mL/min    Comment: (NOTE) The eGFR has been calculated using the CKD EPI equation. This calculation has not been validated in all clinical situations. eGFR's persistently <60 mL/min signify possible Chronic Kidney Disease. Performed at [facility], [place] [address] [place], [place] [address]      HEENT: Feeding tube in nares Cardio: RRR and no murmur Resp: CTA B/L and Unlabored GI: BS positive and NT, ND Extremity:  No Edema Skin:   Wound well healed incision Left crani, staples in Ventriculostomy placement site Neuro: Confused, Abnormal Sensory no response to pinch RUE, withdraws and winces to pinch R toes, Abnormal Motor 0/5 RUE and RLE, at least 4/5 on Left side, Abnormal FMC Ataxic/ dec FMC, Dysarthric and Aphasic Musc/Skel:  Other no pain with UE or LE ROM Gen NAD   Assessment/Plan: 1. 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  Physiatrist is providing close team supervision and 24 hour management of active medical problems listed below. Physiatrist and rehab team continue to assess barriers to discharge/monitor patient progress toward functional and medical  goals. FIM:                                   Medical Problem List and Plan: 1.Decreased functional mobility with right-sided weaknesssecondary to intraparenchymal hematoma in the medial left temporal lobe status post left craniotomy clipping of aneurysm placement of right frontal external ventricular drain 08/14/2017 -CIR evals todays 2. DVT Prophylaxis/Anticoagulation: Subcutaneous heparin. Check vascular study 3. Pain Management:Tylenol as needed 4. Mood:Provide emotional support 5. Neuropsych: This patientis notcapable of making decisions on herown behalf. 6. Skin/Wound Care:Routine skin checks 7. Fluids/Electrolytes/Nutrition:Routine in and outs with follow-up chemistries -NGT feedings, adjust rate/H20 as needed 8.Dysphagia. Dysphagia #1 nectar liquids. Nasogastric tube feeds for nutritional support 9.Hyperglycemia induced from tube feeds. Continue Lantus insulin for coverage. 10.Hypertension. Lopressor 50 mg twice daily Vitals:   09/03/17 1938 09/04/17 0402  BP: 127/85 106/82  Pulse: (!) 102 91  Resp: 16 18  Temp: 98.2 F (36.8 C) (!) 97.5 F (36.4 C)  SpO2: 100% 100%  Controlled 6/5 11.  Vasospasm prophyllaxis-.Nimodipine60 mg every 4 hours x21 days initiated 08/15/2017- last day 6/6  LOS (Days) 1 A FACE TO FACE EVALUATION WAS PERFORMED  Charlett Blake 09/04/2017, 7:41 AM

## 2017-09-04 NOTE — Patient Care Conference (Signed)
Inpatient RehabilitationTeam Conference and Plan of Care Update Date: 09/04/2017   Time: 11:30 AM    Patient Name: Carla Bauer      Medical Record Number: [id number]  Date of Birth: Sep 07, 1955 Sex: Female         Room/Bed: 4W16C/4W16C-01 Payor Info: Payor: MEDICAID POTENTIAL / Plan: MEDICAID POTENTIAL / Product Type: *No Product type* /    Admitting Diagnosis: ICH CORTRAK  Admit Date/Time:  09/03/2017  3:38 PM Admission Comments: No comment available   Primary Diagnosis:  <principal problem not specified> Principal Problem: <principal problem not specified>  Patient Active Problem List   Diagnosis Date Noted  . Intraparenchymal hematoma of brain (HCC) 09/03/2017  . Right hemiparesis (HCC)   . Oropharyngeal dysphagia   . Essential hypertension   . Acute lower UTI   . Tachypnea   . Tachycardia   . Reactive hypertension   . Prediabetes   . Acute blood loss anemia   . Hypokalemia   . Fever   . Leukocytosis   . Acute respiratory failure with hypoxia (HCC)   . 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 ICH (intracerebral hemorrhage) (HCC) 08/14/2017  . SAH (subarachnoid hemorrhage) (HCC) 08/14/2017  . 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 Hydrocephalus 08/14/2017  . Brain herniation (HCC) 08/14/2017  . Cytotoxic brain edema (HCC) 08/14/2017    Expected Discharge Date:    Team Members Present: Physician leading conference: Dr. Claudette LawsAndrew Kirsteins Social Worker Present: Dossie DerBecky Reynold Mantell, LCSW Nurse Present: Other (comment)(Crystal Bennett-RN) PT Present: Woodfin GanjaEmily Van Shagen, PT OT Present: Kearney HardElisabeth Doe, OT SLP Present: Feliberto Gottronourtney Payne, SLP PPS Coordinator present : Tora DuckMarie Noel, RN, CRRN     Current Status/Progress Goal Weekly Team Focus  Medical   Aneurysm rupture with left temporal hemorrhage as well as intraventricular hemorrhage, right hemiparesis and aphasia and  swallowing issues  Avoid aspiration pneumonia, transition to oral feeding  Initiate rehabilitation program, unsure medical status with increased activity level   Bowel/Bladder   pt has foley cathetor incontinent of bowel LBM 06/04  pt will be continent of b/b with mod assist laxatives as needed  assess continued need for foley cathetor toilet program for bowel. Pt will be free of constipation   Swallow/Nutrition/ Hydration   Eval Pending          ADL's     eval pending        Mobility     eval pending        Communication   Eval Pending          Safety/Cognition/ Behavioral Observations  Eval Pending          Pain   pt appears comfortable no s/sx of pain  pt will have pain that is <2/10  assess pain qshift and prn   Skin   pt has staples on top of her head and healing scar on her head   pt will be free of infection and have intact skin  monitor skin qshift and prn      *See Care Plan and progress notes for long and short-term goals.     Barriers to Discharge  Current Status/Progress Possible Resolutions Date Resolved   Physician    Medical stability     Initiating rehabilitation program  Continue rehab, NG tube feeds until p.o. intake improves      Nursing  Incontinence(family dynamics)  (family dynamics)  PT                    OT                  SLP                SW Insurance for SNF coverage;Medication compliance Was not seeing a PCP and has no insurance            Discharge Planning/Teaching Needs:    Home with [name] but she works 7-3 pm. [name] her sister doesn;t work but will need to work out a Higher education careers adviser.     Team Discussion:  Evaluating pt today in therapies. Pt will be here 3-4 weeks and goals being set.  Revisions to Treatment Plan:  New patient    Continued Need for Acute Rehabilitation Level of Care: The patient requires daily medical management by a physician with specialized training in physical medicine and rehabilitation for the following  conditions: Daily direction of a multidisciplinary physical rehabilitation program to ensure safe treatment while eliciting the highest outcome that is of practical value to the patient.: Yes Daily medical management of patient stability for increased activity during participation in an intensive rehabilitation regime.: Yes Daily analysis of laboratory values and/or radiology reports with any subsequent need for medication adjustment of medical intervention for : Blood pressure problems;Neurological problems;Nutritional problems  [name] 09/04/2017, 2:47 PM

## 2017-09-04 NOTE — Progress Notes (Signed)
Patient information reviewed and entered into eRehab system by Abdullahi Vallone, RN, CRRN, PPS Coordinator.  Information including medical coding and functional independence measure will be reviewed and updated through discharge.    

## 2017-09-04 NOTE — Evaluation (Signed)
Speech Language Pathology Assessment and Plan  Patient Details  Name: Carla Bauer MRN: [id number] Date of Birth: Oct 07, 1955  SLP Diagnosis: Aphasia;Cognitive Impairments;Dysphagia  Rehab Potential: Fair ELOS: 28 days     Today's Date: 09/04/2017 SLP Individual Time: 1105-1150 SLP Individual Time Calculation (min): 45 min   Problem List:  Patient Active Problem List   Diagnosis Date Noted  . Intraparenchymal hematoma of brain ([place]) 09/03/2017  . Right hemiparesis ([place])   . Oropharyngeal dysphagia   . Essential hypertension   . Acute lower UTI   . Tachypnea   . Tachycardia   . Reactive hypertension   . Prediabetes   . Acute blood loss anemia   . Hypokalemia   . Fever   . Leukocytosis   . Acute respiratory failure with hypoxia ([place])   . Cerebrovascular accident (CVA) due to thrombosis of left posterior cerebral artery ([place])   . ICH (intracerebral hemorrhage) ([place]) 08/14/2017  . SAH (subarachnoid hemorrhage) ([place]) 08/14/2017  . 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 Hydrocephalus 08/14/2017  . Brain herniation ([place]) 08/14/2017  . Cytotoxic brain edema ([place]) 08/14/2017   Past Medical History: History reviewed. No pertinent past medical history. Past Surgical History:  Past Surgical History:  Procedure Laterality Date  . CESAREAN SECTION    . CRANIOTOMY Left 08/14/2017   Procedure: 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;  Surgeon: Consuella Lose, MD;  Location: [facility];  Service: Neurosurgery;  Laterality: Left;  . LAPAROSCOPIC REVISION VENTRICULAR-PERITONEAL (V-P) SHUNT N/A 08/29/2017   Procedure: LAPAROSCOPIC REVISION VENTRICULAR-PERITONEAL (V-P) SHUNT;  Surgeon: Coralie Keens, MD;  Location: [facility];  Service: General;  Laterality: N/A;  LAPAROSCOPIC REVISION VENTRICULAR-PERITONEAL (V-P) SHUNT  . VENTRICULOPERITONEAL SHUNT Right 08/29/2017   Procedure: SHUNT INSERTION  VENTRICULAR-PERITONEAL;  Surgeon: Consuella Lose, MD;  Location: [facility];  Service: Neurosurgery;  Laterality: Right;  SHUNT INSERTION VENTRICULAR-PERITONEAL    Assessment / Plan / Recommendation Clinical Impression   Carla Bauer a 62 year old right-handed female with history of headaches. Per chart review, patient lives with her daughter. Family arranging care as needed on discharge. 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 Presented 08/14/2017 after being found down by her family with right sided weakness. Cranial CT scan showed left temporal hemorrhage. Per report 4.2 x 3.4 x 2.1 intraparenchymal hematoma in the medial left temporal lobe with intraventricular penetration filling of the left lateral ventricle with blood. Mass-effect with right to left shift of 11 mm. 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 Underwent left craniotomy for clipping of aneurysm, evacuation of left temporal hematoma placement of right frontal external ventricular drain 08/14/2017 per Dr. Kathyrn Sheriff.Multiple attempts made to wean her ventricular catheter unsuccessfully and underwent placement of laparoscopic assisted right ventricular peritoneal shunt 08/29/2017 per Dr. Kathyrn Sheriff. Hospital course UTI treated with Rocephin. Renal ultrasound was completed for UTI showing no hydronephrosis. Diet has been advanced to a dysphagia #1 nectar thick liquid as well as the nasogastric tube remains in place for nutritional support. Subcutaneous heparin initiated for DVT prophylaxis 08/19/2017. Physical and occupational therapy ongoing. Patient was admitted for a comprehensive rehab program.  SLP evaluation was completed on 09/04/2017 with the following results:   Pt presents with profound cognitive deficits and oral motor weakness which impacts swallowing  safety.  Pt has poor initiation of bolus manipulation and decreased attention to boluses  which, in combination with her right sided oral motor weakness leads to anterior loss of boluses, oral holding, and suspected delay in swallow initiation.  No overt s/s of aspiration were evident with trials of thin liquids via teaspoon, nectar thick liquids via cup, or purees; however, intake was minimal due to pt fatigue and refusal.  Pt repeatedly pushed therapist's hand away when offering POs or would turn her head and purse lips.  Pt could feed herself with hand over hand assist for initiation and sequencing. Pt was noted to become orally defensive with oral care.   White coating was noted on tongue which sloughed off with suction toothette.   Given the extent of pt's neuro involvement and concomitant debility, I have concerns about her ability to achieve adequate nutrition orally.  Palliative care consult may be beneficial to determine goals of care.   As mentioned above, pt has severe cognitive deficits characterized by limited focused attention to tasks and right neglect which impact all higher level cognitive processes.  Pt also has severe functional communication deficits.  She was minimally verbal throughout evaluation and what little expression she did achieve was echolalic.  Pt did have one spontaneous, novel sentence level utterance but otherwise she expressed only at the word level.  Difficult to determine to what extent language is primary impairment versus secondary to cognitive deficits.  Pt could intermittently follow 1 step commands in a functional context with max to total assist multimodal cues.  Given the abovementioned deficits, pt would benefit from skilled ST while inpatient in order to maximize functional independence and reduce burden of care prior to discharge.  Anticipate that pt will need 24/7 supervision at discharge in addition to [place] follow up at next level of care.       Skilled  Therapeutic Interventions          Cognitive-linguistic and bedside evaluation completed.  See above.     SLP Assessment  Patient will need skilled Speech Lanaguage Pathology Services during CIR admission    Recommendations  SLP Diet Recommendations: Dysphagia 1 (Puree);Nectar Liquid Administration via: Cup Medication Administration: Via alternative means Supervision: Staff to assist with self feeding;Full supervision/cueing for compensatory strategies;Trained caregiver to feed patient Compensations: Slow rate;Small sips/bites;Monitor for anterior loss;Lingual sweep for clearance of pocketing Postural Changes and/or Swallow Maneuvers: Seated upright 90 degrees;Upright 30-60 min after meal Oral Care Recommendations: Oral care QID Patient destination: Home Follow up Recommendations: Home Health SLP;Outpatient SLP;Skilled Nursing facility;24 hour supervision/assistance Equipment Recommended: To be determined    SLP Frequency 3 to 5 out of 7 days   SLP Duration  SLP Intensity  SLP Treatment/Interventions 28 days   Minumum of 1-2 x/day, 30 to 90 minutes  Cognitive remediation/compensation;Cueing hierarchy;Dysphagia/aspiration precaution training;Functional tasks;Environmental controls;Internal/external aids;Patient/family education;Speech/Language facilitation;Multimodal communication approach    Pain Pain Assessment Pain Scale: Faces Pain Score: Asleep Faces Pain Scale: No hurt  Prior Functioning Cognitive/Linguistic Baseline: Within functional limits Type of Home: House  Lives With: Daughter Available Help at Discharge: Family;Available 24 hours/day Vocation: Full time employment  Function:  Eating Eating   Modified Consistency Diet: Yes             Cognition Comprehension Comprehension assist level: Understands basic 25 - 49% of the time/ requires cueing 50 - 75% of the time  Expression   Expression assist level: Expresses basic 25 - 49% of the time/requires  cueing 50 - 75% of the time. Uses single words/gestures.  Social Interaction Social Interaction assist level: Interacts appropriately  less than 25% of the time. May be withdrawn or combative.  Problem Solving Problem solving assist level: Solves basic less than 25% of the time - needs direction nearly all the time or does not effectively solve problems and may need a restraint for safety  Memory Memory assist level: Recognizes or recalls less than 25% of the time/requires cueing greater than 75% of the time   Short Term Goals: Week 1: SLP Short Term Goal 1 (Week 1): Pt will focus her attention to a targeted stimulus in 75% of opportunities with max assist multimodal cues.   SLP Short Term Goal 2 (Week 1): Pt will follow 1 step commands during basic, structured tasks for 75% accuracy with max assist multimodal cues.  SLP Short Term Goal 3 (Week 1): Pt will locate items at midline in 50% of opportunities during basic, structured tasks with max assist multimodal cues.  SLP Short Term Goal 4 (Week 1): Pt will consume dys 1 textures and nectar thick liquids with max assist multimodal cues for use of swallowing precautions and no overt s/s of aspiration.  SLP Short Term Goal 5 (Week 1): Pt will verbally respond to basic, personally, relevant questions in 25% of opportunities with max assist multimodal cues.   Refer to Care Plan for Long Term Goals  Recommendations for other services: None   Discharge Criteria: Patient will be discharged from SLP if patient refuses treatment 3 consecutive times without medical reason, if treatment goals not met, if there is a change in medical status, if patient makes no progress towards goals or if patient is discharged from hospital.  The above assessment, treatment plan, treatment alternatives and goals were discussed and mutually agreed upon: by patient  Emilio Math 09/04/2017, 12:45 PM

## 2017-09-04 NOTE — Progress Notes (Signed)
Initial Nutrition Assessment  DOCUMENTATION CODES:   Not applicable  INTERVENTION:  Initiate tube feedings to run over 16 hours to allow for po encouragement at breakfast and lunch: Osmolite 1.5 formula via Cortrak NGT at goal rate of 65 ml/hr x 16 hours (4pm-8am) with 30 ml Prostat per tube once daily.   Continue free water flushes of 400 ml q 6 hours. MD to adjust free water as appropriate.  Tube feeding regimen provides 1660 kcal (100% of needs), 80 grams of protein, 2390 ml water.   Provide Magic cup TID with meals, each supplement provides 290 kcal and 9 grams of protein.  NUTRITION DIAGNOSIS:   Inadequate oral intake related to dysphagia as evidenced by meal completion < 25%.  GOAL:   Patient will meet greater than or equal to 90% of their needs  MONITOR:   PO intake, Supplement acceptance, Diet advancement, Weight trends, Labs, TF tolerance, Skin, I & O's  REASON FOR ASSESSMENT:   Consult Enteral/tube feeding initiation and management  ASSESSMENT:   62 year old female with history of headaches. Presented 08/14/2017 after being found down by her family with right sided weakness. Cranial CT scan showed left temporal hemorrhage. Underwent left craniotomy for clipping of aneurysm, evacuation of left temporal hematoma placement of right frontal external ventricular drain. Diet has been advanced to a dysphagia #1 nectar thick liquid as well as the nasogastric tube remains in place for nutritional support.   No family at bedside. Pt non verbal and did not acknowledge RD presence at time of visit. RD unable to obtain most recent nutrition history. Pt is currently on a dysphagia 1 diet with nectar thick liquids. Meal completion 0% at breakfast this AM. Tube feeding has been infusing continuously and pt has been tolerating it. RD to modify tube feeds to run over 16 hours throughout the late afternoon and overnight to encourage po intake at breakfast and lunch. RD to modify tube  feeding orders once pt improves on po intake.    Unable to complete Nutrition-Focused physical exam at this time. RD to perform physical exam at next visit.   Labs and medications reviewed.   Diet Order:   Diet Order           DIET - DYS 1 Room service appropriate? Yes; Fluid consistency: Nectar Thick  Diet effective now          EDUCATION NEEDS:   Not appropriate for education at this time  Skin:  Skin Assessment: Skin Integrity Issues: Skin Integrity Issues:: Incisions Incisions: head  Last BM:  6/5  Height:   Ht Readings from Last 1 Encounters:  09/03/17 5\' 1"  (1.549 m)    Weight:   Wt Readings from Last 1 Encounters:  09/04/17 154 lb 9.4 oz (70.1 kg)    Ideal Body Weight:  47.7 kg  BMI:  Body mass index is 29.21 kg/m.  Estimated Nutritional Needs:   Kcal:  1650-1850  Protein:  80-95 grams  Fluid:  Per MD    Roslyn SmilingStephanie Saida Lonon, MS, RD, LDN Pager # [phone] After hours/ weekend pager # [phone]

## 2017-09-04 NOTE — Progress Notes (Signed)
Social Work  Social Work Assessment and Plan  Patient Details  Name: Carla Bauer MRN: [id number] Date of Birth: [birth year]  Today's Date: 09/04/2017  Problem List:  Patient Active Problem List   Diagnosis Date Noted  . Intraparenchymal hematoma of brain (HCC) 09/03/2017  . Right hemiparesis (HCC)   . Oropharyngeal dysphagia   . Essential hypertension   . Acute lower UTI   . Tachypnea   . Tachycardia   . Reactive hypertension   . Prediabetes   . Acute blood loss anemia   . Hypokalemia   . Fever   . Leukocytosis   . Acute respiratory failure with hypoxia (HCC)   . 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 ICH (intracerebral hemorrhage) (HCC) 08/14/2017  . SAH (subarachnoid hemorrhage) (HCC) 08/14/2017  . 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 Hydrocephalus 08/14/2017  . Brain herniation (HCC) 08/14/2017  . Cytotoxic brain edema (HCC) 08/14/2017   Past Medical History: History reviewed. No pertinent past medical history. Past Surgical History:  Past Surgical History:  Procedure Laterality Date  . CESAREAN SECTION    . CRANIOTOMY Left 08/14/2017   Procedure: 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;  Surgeon: Lisbeth Renshaw, MD;  Location: [facility] OR;  Service: Neurosurgery;  Laterality: Left;  . LAPAROSCOPIC REVISION VENTRICULAR-PERITONEAL (V-P) SHUNT N/A 08/29/2017   Procedure: LAPAROSCOPIC REVISION VENTRICULAR-PERITONEAL (V-P) SHUNT;  Surgeon: Abigail Miyamoto, MD;  Location: [facility] OR;  Service: General;  Laterality: N/A;  LAPAROSCOPIC REVISION VENTRICULAR-PERITONEAL (V-P) SHUNT  . VENTRICULOPERITONEAL SHUNT Right 08/29/2017   Procedure: SHUNT INSERTION VENTRICULAR-PERITONEAL;  Surgeon: Lisbeth Renshaw, MD;  Location: [facility] OR;  Service: Neurosurgery;  Laterality: Right;  SHUNT INSERTION VENTRICULAR-PERITONEAL   Social History:  reports that she  has never smoked. She has never used smokeless tobacco. She reports that she does not drink alcohol or use drugs.  Family / Support Systems Marital Status: Separated How Long?: 1 year Patient Roles: Spouse, Parent, Other (Comment)(employee) Spouse/Significant Other: Alecia Lemming [phone] Children: [name]-daughter [phone]-cell  [name]-daughter [phone]-cell Other Supports: Friends Anticipated Caregiver: Architectural technologist Ability/Limitations of Caregiver: [name] works first shift and [name] is unemployed, both do not get along so will need to cooperate Caregiver Availability: Other (Comment)(Unsure at this time-plan to take home) Family Dynamics: According to latoya Alecia Lemming has not visited pt while in hospital and she ahd her sister have relationship issues, they will need to focus on their Mom and her needs instead of their disagreements. Pt has been staying with latoya since separation one year ago.  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 [name] wants to be the contact since she is the most involved and pt was living her prior to admission   Abuse/Neglect Abuse/Neglect Assessment Can Be Completed: Yes Physical Abuse: Denies Verbal Abuse: Denies Sexual Abuse: Denies Exploitation of patient/patient's resources: Denies Self-Neglect: Denies  Emotional Status Pt's affect, behavior adn adjustment status: [name] provides the information due to pt unable to. She reports she was very independent and took care of herself and her affairs,  she helped [place]  with her children-grandchildren and they were doing well until this happened. Pt has just gottent a new job and was doing well. Recent Psychosocial Issues: spearation from husband and new job Pyschiatric History: No history deferred depression screening due to not able to complete and will need to re-assess as stay progresses. Substance Abuse History: No issues  Patient / Family Perceptions, Expectations & Goals Pt/Family understanding of illness & functional limitations: [name] can explain her Mom's surgeries and treatments. She talks with the MD and nurses daily and feels she has a good understanding of what is going on. She voiced her sister is here daily and talks with staff. Both are hopeful she will do well and make good progress while here. Premorbid pt/family roles/activities: Mom, grandmother, employee, friend, etc Anticipated changes in roles/activities/participation: resume Pt/family expectations/goals: [name] states: " I hope she does well here and she recovers, goal is to get her home."  Pt not able to state her goals  [employer] Community Agencies: None Premorbid Home Care/DME Agencies: None Transportation available at discharge: family Resource referrals recommended: Neuropsychology, Support group (specify)  Discharge Planning Living Arrangements: Children Support Systems: Children, Other relatives, Friends/neighbors, Psychologist, clinicalChurch/faith community Type of Residence: Private residence Insurance Resources: Self-pay(Applying for disability and Medicaid) Surveyor, quantityinancial Resources: Employment, Garment/textile technologistamily Support Financial Screen Referred: Yes Living Expenses: Rent Money Management: Patient, Family Does the patient have any problems obtaining your medications?: Yes (Describe)(uninsured no PCP following) Home Management: Self and daughter Patient/Family Preliminary Plans: Made aware pt will require 24 hr physical care when discharged from rehab. She and sister will need  to come up with a plan for this. She has been in contact with Christia Readingjessica Wright for applying for disability and medicaid. Sw Barriers to Discharge: Insurance for SNF coverage, Medication compliance Sw Barriers to Discharge Comments: Was not seeing a PCP and has no insurance Social Work Anticipated Follow Up Needs: HH/OP, Support Group  Clinical Impression Supportive and involved daughter's who do not get along, but will need to focus on pt and her needs instead of their issues. Will make sure applies for disability and medicaid in case pt needs to go to a NH from here. Will evaluate pt once able to be assessed. Would benefit from neuro-psych for coping while here.  Lucy Chrisupree, Mekiyah Gladwell G 09/04/2017, 2:41 PM

## 2017-09-04 NOTE — Care Management Note (Signed)
[facility] Individual Statement of Services  Patient Name:  [name]  Date:  09/04/2017  Welcome to the [facility].  Our goal is to provide you with an individualized program based on your diagnosis and situation, designed to meet your specific needs.  With this comprehensive rehabilitation program, you will be expected to participate in at least 3 hours of rehabilitation therapies Monday-Friday, with modified therapy programming on the weekends.  Your rehabilitation program will include the following services:  Physical Therapy (PT), Occupational Therapy (OT), Speech Therapy (ST), 24 hour per day rehabilitation nursing, Therapeutic Recreaction (TR), Neuropsychology, Case Management (Social Worker), Rehabilitation Medicine, Nutrition Services and Pharmacy Services  Weekly team conferences will be held on Wednesday to discuss your progress.  Your Social Worker will talk with you frequently to get your input and to update you on team discussions.  Team conferences with you and your family in attendance may also be held.  Expected length of stay: 4 weeks Overall anticipated outcome: min-mod level  Depending on your progress and recovery, your program may change. Your Social Worker will coordinate services and will keep you informed of any changes. Your Social Worker's name and contact numbers are listed  below.  The following services may also be recommended but are not provided by the [facility]:    Home Health Rehabiltiation Services  Outpatient Rehabilitation Services  Vocational Rehabilitation   Arrangements will be made to provide these services after discharge if needed.  Arrangements include referral to agencies that provide these services.  Your insurance has been verified to be:  None-to apply for medicaid Your primary doctor is:  None  Pertinent information will be shared with your doctor and your insurance  company.  Social Worker:  Dossie DerBecky Sawyer [place], SW [phone] or ([phone]) [phone]  Information discussed with and copy given to patient by: Lucy Chrisupree, Jina Olenick G, 09/04/2017, 10:03 AM

## 2017-09-04 NOTE — Plan of Care (Signed)
  Problem: RH BOWEL ELIMINATION Goal: RH STG MANAGE BOWEL WITH ASSISTANCE Description STG Manage Bowel with Assistance. Mod  Outcome: Progressing   Problem: RH BLADDER ELIMINATION Goal: RH STG MANAGE BLADDER WITH ASSISTANCE Description STG Manage Bladder With Assistance. Mod  Outcome: Progressing Goal: RH STG MANAGE BLADDER WITH EQUIPMENT WITH ASSISTANCE Description STG Manage Bladder With Equipment With Assistance. MOD  Outcome: Progressing   Problem: RH COGNITION-NURSING Goal: RH STG USES MEMORY AIDS/STRATEGIES W/ASSIST TO PROBLEM SOLVE Description STG Uses Memory Aids/Strategies With Assistance to Problem Solve. Cues and supervision  Outcome: Not Progressing

## 2017-09-05 ENCOUNTER — Inpatient Hospital Stay (HOSPITAL_COMMUNITY): Payer: Self-pay | Admitting: Occupational Therapy

## 2017-09-05 ENCOUNTER — Encounter (HOSPITAL_COMMUNITY): Payer: Self-pay

## 2017-09-05 ENCOUNTER — Inpatient Hospital Stay (HOSPITAL_COMMUNITY): Payer: Medicaid Other | Admitting: Speech Pathology

## 2017-09-05 ENCOUNTER — Inpatient Hospital Stay (HOSPITAL_COMMUNITY): Payer: Self-pay | Admitting: Physical Therapy

## 2017-09-05 LAB — GLUCOSE, CAPILLARY
GLUCOSE-CAPILLARY: 148 mg/dL — AB (ref 65–99)
GLUCOSE-CAPILLARY: 86 mg/dL (ref 65–99)
GLUCOSE-CAPILLARY: 87 mg/dL (ref 65–99)
GLUCOSE-CAPILLARY: 94 mg/dL (ref 65–99)
Glucose-Capillary: 67 mg/dL (ref 65–99)
Glucose-Capillary: 227 mg/dL — ABNORMAL HIGH (ref 65–99)
Glucose-Capillary: 101 mg/dL — ABNORMAL HIGH (ref 65–99)
Glucose-Capillary: 204 mg/dL — ABNORMAL HIGH (ref 65–99)

## 2017-09-05 MED ORDER — CALCIUM POLYCARBOPHIL 625 MG PO TABS
625.0000 mg | ORAL_TABLET | Freq: Every day | ORAL | Status: DC
  Administered 2017-09-05 – 2017-09-27 (×22): 625 mg via ORAL
  Filled 2017-09-05 (×23): qty 1

## 2017-09-05 NOTE — Progress Notes (Signed)
Physical Therapy Session Note  Patient Details  Name: Carla Bauer MRN: [id number] Date of Birth: 08/16/1955  Today's Date: 09/05/2017 PT Individual Time: 0800-0856 PT Individual Time Calculation (min): 56 min   Short Term Goals: Week 1:  PT Short Term Goal 1 (Week 1): Pt will perform bed mobility with mod assist PT Short Term Goal 2 (Week 1): Pt will perform sit<>stand with mod assist PT Short Term Goal 3 (Week 1): Pt will initiate gait training PT Short Term Goal 4 (Week 1): Pt will perform bed<>chair transfer with max assist +1  Skilled Therapeutic Interventions/Progress Updates:   Pt in supine and appears agreeable to therapy, no evidence or c/o pain throughout session. Session focused on initiation and attention to performing functional tasks including transfers, eating breakfast, and standing. Min cues to initiate transfer to EOB and pt initiating remainder of transfer w/ total manual assist. Maintained static sitting w/ min assist for 5 min while RN present providing medication. Intermittently released manual assist and pt leaning to R side, no righting reactions or protective responses in response to LOB. Transferred to TIS via stedy, total assist x1. Max verbal and manual cues to initiate standing to stedy. Assisted pt w/ eating breakfast, max verbal, visual, and manual cues to initiate bites, able to take 3 bites and drink about half an orange juice before refusing further food. Practiced performing sit<>stands to stedy, max assist to initiate and min assist to boost once initiated. After a few reps, pt total assist to boost, suspect 2/2 behavior and refusing further participation. Ended session tilted in TIS, call bell within reach and all needs met. Quick release belt and chair alarm engaged.   Therapy Documentation Precautions:  Precautions Precautions: Fall Restrictions Weight Bearing Restrictions: No  See Function Navigator for Current Functional Status.   Therapy/Group:  Individual Therapy  Avril Busser K Arnette 09/05/2017, 8:58 AM

## 2017-09-05 NOTE — Progress Notes (Signed)
Speech Language Pathology Daily Session Note  Patient Details  Name: Carla Bauer MRN: [id number] Date of Birth: [birth year]  Today's Date: 09/05/2017 SLP Individual Time: 1015-1110 SLP Individual Time Calculation (min): 55 min  Short Term Goals: Week 1: SLP Short Term Goal 1 (Week 1): Pt will focus her attention to a targeted stimulus in 75% of opportunities with max assist multimodal cues.   SLP Short Term Goal 2 (Week 1): Pt will follow 1 step commands during basic, structured tasks for 75% accuracy with max assist multimodal cues.  SLP Short Term Goal 3 (Week 1): Pt will locate items at midline in 50% of opportunities during basic, structured tasks with max assist multimodal cues.  SLP Short Term Goal 4 (Week 1): Pt will consume dys 1 textures and nectar thick liquids with max assist multimodal cues for use of swallowing precautions and no overt s/s of aspiration.  SLP Short Term Goal 5 (Week 1): Pt will verbally respond to basic, personally, relevant questions in 25% of opportunities with max assist multimodal cues.   Skilled Therapeutic Interventions: Skilled treatment session focused on cognitive goals. Upon arrival, patient was awake while upright in the wheelchair. SLP facilitated session by providing total hand over hand A for patient to initiate functional tasks. However, patient resistive to assistance and did not follow any commands within the session.  Patient with minimal social engagement throughout session and would only tap her foot to familiar music. Patient essentially nonverbal throughout session but answered questions at the phrase level as to wants/needs at end of session (tv on and what channel) as clinician as leaving the room. Although patient has severe cognitive impairments, suspect there is a behavioral component to limited initiation and verbal expression. Patient left upright in wheelchair with quick release belt in place. Continue with current plan of care. Of note,  discuss patient's behavior with patient's daughter (during lunch) and patient's daughter agreed that she believed that the patient could do more than what she is currently doing in therapy.      Function:   Cognition Comprehension Comprehension assist level: Understands basic 25 - 49% of the time/ requires cueing 50 - 75% of the time  Expression   Expression assist level: Expresses basis less than 25% of the time/requires cueing >75% of the time.  Social Interaction Social Interaction assist level: Interacts appropriately less than 25% of the time. May be withdrawn or combative.  Problem Solving Problem solving assist level: Solves basic less than 25% of the time - needs direction nearly all the time or does not effectively solve problems and may need a restraint for safety  Memory Memory assist level: Recognizes or recalls less than 25% of the time/requires cueing greater than 75% of the time    Pain No indications of pain   Therapy/Group: Individual Therapy  Louie Meaders 09/05/2017, 3:14 PM

## 2017-09-05 NOTE — Progress Notes (Signed)
Occupational Therapy Session Note  Patient Details  Name: Carla Bauer MRN: [id number] Date of Birth: 09/28/1955  Today's Date: 09/05/2017 OT Individual Time: 1300-1409 OT Individual Time Calculation (min): 69 min   Short Term Goals: Week 1:  OT Short Term Goal 1 (Week 1): Pt will sit EOB with no more than mod A while participating in BADL task OT Short Term Goal 2 (Week 1): Pt will locate 1/4 grooming items at the sink with mod instructional cues OT Short Term Goal 3 (Week 1): Pt will complete 2/10 bathing steps to decrease caregiver burder. OT Short Term Goal 4 (Week 1): Pt will complete toilet transfer with max +2 assist  Skilled Therapeutic Interventions/Progress Updates:    Pt greeted seated in TIS wc with daughter present. Worked on UB and LB dressing sitting in wc. Max multimodal cues and encouragement from daughter for pt to participate. Had p's daughter stand on pt's R side to try to get pt to attend to R, but unsuccessful. Max multimodal cues and hand over hand A to initiate any dressing tasks-overall total A. Sit<>stand in setdy with max A +2 to pull pants over hips. Pt brought to quiet therapy gym. Tried to engage pt in more standing, but pt refused to participate in standing. Tried to engage pt in reaching activity with cups, but pt resistive to any hand over hand A and would often verbalize "no" and "I don't want to." Utilized music therapy with [facility] station on [place], but minimal reaction noted. Pt eventually agreeable to name 4/4 colors of cups. Stedy used to transfer pt back to bed with max A +2.   Therapy Documentation Precautions:  Precautions Precautions: Fall Restrictions Weight Bearing Restrictions: No  none/denies pain ADL: ADL ADL Comments: Please see functional navigator  See Function Navigator for Current Functional Status.   Therapy/Group: Individual Therapy  Carla Bauer 09/05/2017, 2:09 PM

## 2017-09-05 NOTE — Progress Notes (Signed)
Subjective/Complaints:  ROS- neg CP, SOB.N/V/D Objective: Vital Signs: Blood pressure 115/66, pulse 93, temperature 99 F (37.2 C), resp. rate 17, height _0  (1.549 m), weight 66.9 kg (147 lb 7.8 oz), SpO2 100 %. No results found. Results for orders placed or performed during the hospital encounter of 09/03/17 (from the past 72 hour(s))  CBC     Status: Abnormal   Collection Time: 09/03/17  4:09 PM  Result Value Ref Range   WBC 9.7 4.0 - 10.5 K/uL   RBC 3.33 (L) 3.87 - 5.11 MIL/uL   Hemoglobin 9.9 (L) 12.0 - 15.0 g/dL   HCT 31.6 (L) 36.0 - 46.0 %   MCV 94.9 78.0 - 100.0 fL   MCH 29.7 26.0 - 34.0 pg   MCHC 31.3 30.0 - 36.0 g/dL   RDW 15.9 (H) 11.5 - 15.5 %   Platelets 376 150 - 400 K/uL    Comment: Performed at [facility], [place] [address] [place], [place] [address]  Creatinine, serum     Status: None   Collection Time: 09/03/17  4:09 PM  Result Value Ref Range   Creatinine, Ser 0.50 0.44 - 1.00 mg/dL   GFR calc non Af Amer >60 >60 mL/min   GFR calc Af Amer >60 >60 mL/min    Comment: (NOTE) The eGFR has been calculated using the CKD EPI equation. This calculation has not been validated in all clinical situations. eGFR's persistently <60 mL/min signify possible Chronic Kidney Disease. Performed at [facility], [place] [address], [place], [place] [address]   Glucose, capillary     Status: Abnormal   Collection Time: 09/03/17  5:01 PM  Result Value Ref Range   Glucose-Capillary 187 (H) 65 - 99 mg/dL  Glucose, capillary     Status: Abnormal   Collection Time: 09/03/17  7:41 PM  Result Value Ref Range   Glucose-Capillary 115 (H) 65 - 99 mg/dL  Glucose, capillary     Status: Abnormal   Collection Time: 09/03/17 11:50 PM  Result Value Ref Range   Glucose-Capillary 150 (H) 65 - 99 mg/dL  Glucose, capillary     Status: Abnormal   Collection Time: 09/04/17  3:55 AM  Result Value Ref Range   Glucose-Capillary 130 (H) 65 - 99 mg/dL  CBC WITH DIFFERENTIAL      Status: Abnormal   Collection Time: 09/04/17  6:55 AM  Result Value Ref Range   WBC 8.8 4.0 - 10.5 K/uL   RBC 3.12 (L) 3.87 - 5.11 MIL/uL   Hemoglobin 9.2 (L) 12.0 - 15.0 g/dL   HCT 29.1 (L) 36.0 - 46.0 %   MCV 93.3 78.0 - 100.0 fL   MCH 29.5 26.0 - 34.0 pg   MCHC 31.6 30.0 - 36.0 g/dL   RDW 15.7 (H) 11.5 - 15.5 %   Platelets 370 150 - 400 K/uL   Neutrophils Relative % 59 %   Neutro Abs 5.3 1.7 - 7.7 K/uL   Lymphocytes Relative 33 %   Lymphs Abs 2.9 0.7 - 4.0 K/uL   Monocytes Relative 5 %   Monocytes Absolute 0.4 0.1 - 1.0 K/uL   Eosinophils Relative 2 %   Eosinophils Absolute 0.2 0.0 - 0.7 K/uL   Basophils Relative 1 %   Basophils Absolute 0.0 0.0 - 0.1 K/uL   Immature Granulocytes 0 %   Abs Immature Granulocytes 0.0 0.0 - 0.1 K/uL    Comment: Performed at [facility], [address] [place], [place]  [address]  Comprehensive metabolic panel     Status: Abnormal   Collection Time: 09/04/17  6:55 AM  Result Value Ref Range   Sodium 145 135 - 145 mmol/L   Potassium 3.1 (L) 3.5 - 5.1 mmol/L   Chloride 105 101 - 111 mmol/L   CO2 30 22 - 32 mmol/L   Glucose, Bld 150 (H) 65 - 99 mg/dL   BUN 11 6 - 20 mg/dL   Creatinine, Ser 0.49 0.44 - 1.00 mg/dL   Calcium 9.6 8.9 - 10.3 mg/dL   Total Protein 6.6 6.5 - 8.1 g/dL   Albumin 2.9 (L) 3.5 - 5.0 g/dL   AST 26 15 - 41 U/L   ALT 24 14 - 54 U/L   Alkaline Phosphatase 113 38 - 126 U/L   Total Bilirubin 0.3 0.3 - 1.2 mg/dL   GFR calc non Af Amer >60 >60 mL/min   GFR calc Af Amer >60 >60 mL/min    Comment: (NOTE) The eGFR has been calculated using the CKD EPI equation. This calculation has not been validated in all clinical situations. eGFR's persistently <60 mL/min signify possible Chronic Kidney Disease.    Anion gap 10 5 - 15    Comment: Performed at [facility] [address] [place], [place] [address]  Glucose, capillary     Status: Abnormal   Collection Time: 09/04/17  8:11 AM  Result Value Ref Range    Glucose-Capillary 132 (H) 65 - 99 mg/dL  Glucose, capillary     Status: Abnormal   Collection Time: 09/04/17 11:44 AM  Result Value Ref Range   Glucose-Capillary 166 (H) 65 - 99 mg/dL     HEENT: Feeding tube in nares Cardio: RRR and no murmur Resp: CTA B/L and Unlabored GI: BS positive and NT, ND Extremity:  No Edema Skin:   Wound well healed incision Left crani, staples in Ventriculostomy placement site Neuro: Confused, Abnormal Sensory no response to pinch RUE, withdraws and winces to pinch R toes, Abnormal Motor 0/5 RUE and RLE, at least 4/5 on Left side, Abnormal FMC Ataxic/ dec FMC, Dysarthric and Aphasic Musc/Skel:  Other no pain with UE or LE ROM Gen NAD   Assessment/Plan: 1. 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 Physiatrist is providing close team supervision and 24 hour management of active medical problems listed below. Physiatrist and rehab team continue to assess barriers to discharge/monitor patient progress toward functional and medical goals. FIM: Function - Bathing Position: Sitting EOB Body parts bathed by helper: Right arm, Left arm, Chest, Abdomen, Front perineal area, Buttocks, Right upper leg, Left upper leg, Right lower leg, Left lower leg, Back Assist Level: 2 helpers  Function- Upper Body Dressing/Undressing What is the patient wearing?: Pull over shirt/dress Pull over shirt/dress - Perfomed by helper: Thread/unthread right sleeve, Thread/unthread left sleeve, Put head through opening, Pull shirt over trunk Assist Level: 2 helpers Function - Lower Body Dressing/Undressing What is the patient wearing?: Pants, Non-skid slipper socks Position: Sitting EOB Pants- Performed by helper: Thread/unthread right pants leg, Thread/unthread left pants leg, Pull pants up/down Non-skid slipper socks- Performed by  helper: Don/doff left sock, Don/doff right sock Assist for footwear: Dependant Assist for lower body dressing: 2 Helpers  Function - Toileting Toileting activity did not occur: No continent bowel/bladder event  Function - Air cabin crew transfer activity did not occur: Safety/medical concerns  Function - Chair/bed transfer Chair/bed transfer method: Stand pivot Chair/bed transfer assist level: 2 helpers Chair/bed transfer assistive device: Armrests Chair/bed transfer details: Manual facilitation for weight shifting, Manual facilitation for placement, Verbal cues for technique, Verbal cues for precautions/safety  Function - Locomotion: Wheelchair Will patient use wheelchair at discharge?: Yes Type: Manual Max wheelchair distance: 150 ft Assist Level: Dependent (Pt equals 0%) Assist Level: Dependent (Pt equals 0%) Assist Level: Dependent (Pt equals 0%) Turns around,maneuvers to table,bed, and toilet,negotiates 3% grade,maneuvers on rugs and over doorsills: No Function - Locomotion: Ambulation Ambulation activity did not occur: Safety/medical concerns  Function - Comprehension Comprehension: Auditory Comprehension assist level: Understands basic 25 - 49% of the time/ requires cueing 50 - 75% of the time  Function - Expression Expression: Verbal Expression assist level: Expresses basic 25 - 49% of the time/requires cueing 50 - 75% of the time. Uses single words/gestures.  Function - Social Interaction Social Interaction assist level: Interacts appropriately less than 25% of the time. May be withdrawn or combative.  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 SLP- staples out 6/10 or 6/11 2. DVT Prophylaxis/Anticoagulation: Subcutaneous heparin. Check vascular study 3. Pain Management:Tylenol as needed 4. Mood:Provide emotional support 5. Neuropsych: This patientis notcapable of making decisions on herown behalf. 6. Skin/Wound Care:Routine skin checks 7. Fluids/Electrolytes/Nutrition:Routine in and outs with follow-up chemistries -NGT feedings, adjust rate/H20 as needed- dietary consult  8.Dysphagia. Dysphagia #1 nectar liquids. Nasogastric tube feeds for nutritional support 9.Hyperglycemia induced from tube feeds. Continue Lantus insulin for coverage. 10.Hypertension. Lopressor 50 mg twice daily Vitals:   09/04/17 2047 09/05/17 0409  BP: 136/83 115/66  Pulse: (!) 103 93  Resp: 16 17  Temp: 99 F (37.2 C) 99 F (37.2 C)  SpO2: 100% 100%  Controlled 6/6 11.  Vasospasm prophyllaxis-.Nimodipine60 mg every 4 hours x21 days initiated 08/15/2017- last day 6/6 12.   LOS (Days) 2 A FACE TO FACE EVALUATION WAS PERFORMED  Charlett Blake 09/05/2017, 7:37 AM

## 2017-09-05 NOTE — Progress Notes (Signed)
Social Work Patient ID: Carla Bauer, female   DOB: Dec 09, 1955, 62 y.o.   MRN: [id number]  Met with [name]-daughter who was here visiting to introduce self too and discuss pt will need 24 hr care at discharge. She reports her Mom can do more than she is trying to do especially with speech. She is here to encourage her to work hard in therapies. Made aware Wed at team conference will have goals and target discharge date for.

## 2017-09-06 ENCOUNTER — Inpatient Hospital Stay (HOSPITAL_COMMUNITY): Payer: Self-pay | Admitting: Physical Therapy

## 2017-09-06 ENCOUNTER — Inpatient Hospital Stay (HOSPITAL_COMMUNITY): Payer: Self-pay | Admitting: Occupational Therapy

## 2017-09-06 ENCOUNTER — Inpatient Hospital Stay (HOSPITAL_COMMUNITY): Payer: Medicaid Other | Admitting: Speech Pathology

## 2017-09-06 ENCOUNTER — Inpatient Hospital Stay (HOSPITAL_COMMUNITY): Payer: Medicaid Other

## 2017-09-06 DIAGNOSIS — M7989 Other specified soft tissue disorders: Secondary | ICD-10-CM

## 2017-09-06 LAB — GLUCOSE, CAPILLARY
GLUCOSE-CAPILLARY: 113 mg/dL — AB (ref 65–99)
GLUCOSE-CAPILLARY: 87 mg/dL (ref 65–99)
Glucose-Capillary: 150 mg/dL — ABNORMAL HIGH (ref 65–99)
Glucose-Capillary: 128 mg/dL — ABNORMAL HIGH (ref 65–99)
Glucose-Capillary: 87 mg/dL (ref 65–99)
Glucose-Capillary: 167 mg/dL — ABNORMAL HIGH (ref 65–99)

## 2017-09-06 MED ORDER — PRO-STAT SUGAR FREE PO LIQD
30.0000 mL | Freq: Two times a day (BID) | ORAL | Status: DC
  Administered 2017-09-06 – 2017-09-12 (×12): 30 mL
  Filled 2017-09-06 (×12): qty 30

## 2017-09-06 NOTE — Progress Notes (Signed)
Speech Language Pathology Daily Session Notes  Patient Details  Name: Carla Bauer MRN: [id number] Date of Birth: 1955/04/04  Today's Date: 09/06/2017  Session 1: SLP Individual Time: 0930-1000 SLP Individual Time Calculation (min): 30 min  Session 2: SLP Individual Time: 1440-1455 SLP Individual Time Calculation (min) : 15 min Missed Time: 15 mins, decreased participation.   Short Term Goals: Week 1: SLP Short Term Goal 1 (Week 1): Pt will focus her attention to a targeted stimulus in 75% of opportunities with max assist multimodal cues.   SLP Short Term Goal 2 (Week 1): Pt will follow 1 step commands during basic, structured tasks for 75% accuracy with max assist multimodal cues.  SLP Short Term Goal 3 (Week 1): Pt will locate items at midline in 50% of opportunities during basic, structured tasks with max assist multimodal cues.  SLP Short Term Goal 4 (Week 1): Pt will consume dys 1 textures and nectar thick liquids with max assist multimodal cues for use of swallowing precautions and no overt s/s of aspiration.  SLP Short Term Goal 5 (Week 1): Pt will verbally respond to basic, personally, relevant questions in 25% of opportunities with max assist multimodal cues.   Skilled Therapeutic Interventions:  Session 1: Skilled treatment session focused on cognitive goals. Upon arrival, patient had been incontinent of bowel. SLP facilitated session by providing extra time and overall Mod A verbal cues for patient to follow commands during bed mobility and pericare. Patient demonstrated increased initiation this session with self-care tasks (washcloth and brushing teeth) but required total A for thoroughness. Patient also demonstrated increased verbal expression this session. However, responses were sometimes unintelligible or unrelated to question requiring overall Total A for orientation to place and situation. Patient left upright in bed with alarm on and all needs within reach. Continue with  current plan of care.   Session 2: Skilled treatment session focused on cognitive-linguistic goals. Since patient demonstrated increased verbal expression today, SLP wanted to further assess patient's language function. However, patient declined to participate when asked to name functional items and would not engage in a functional conversation about biographical information with most responses consisting of, "Its none of your business." Language of confusion vs jargon was noted intermittently throughout session but language was also difficulty to assess due to impaired intelligibility. Patient missed 15 mins due to declining participation. Patient left upright in wheelchair with all needs within reach and quick release belt in place. Continue with current plan of care.    Function:  Eating Eating   Modified Consistency Diet: Yes Eating Assist Level: Helper performs IV, parenteral or tube feed;Helper brings food to mouth;Helper scoops food on utensil;Help with picking up utensils;Help managing cup/glass           Cognition Comprehension Comprehension assist level: Understands basic 25 - 49% of the time/ requires cueing 50 - 75% of the time  Expression   Expression assist level: Expresses basis less than 25% of the time/requires cueing >75% of the time.  Social Interaction Social Interaction assist level: Interacts appropriately less than 25% of the time. May be withdrawn or combative.  Problem Solving Problem solving assist level: Solves basic less than 25% of the time - needs direction nearly all the time or does not effectively solve problems and may need a restraint for safety  Memory Memory assist level: Recognizes or recalls less than 25% of the time/requires cueing greater than 75% of the time    Pain Pain Assessment Pain Score: 0-No pain  Therapy/Group: Individual Therapy  Carla Bauer 09/06/2017, 12:46 PM

## 2017-09-06 NOTE — Progress Notes (Signed)
Subjective/Complaints: Fluently aphasic, with jargon, limited communication- appreciate SLP note  ROS see above Objective: Vital Signs: Blood pressure 128/78, pulse (!) 107, temperature 98 F (36.7 C), temperature source Oral, resp. rate 18, height 5' 1" (1.549 m), weight 65.6 kg (144 lb 10 oz), SpO2 98 %. No results found. Results for orders placed or performed during the hospital encounter of 09/03/17 (from the past 72 hour(s))  CBC     Status: Abnormal   Collection Time: 09/03/17  4:09 PM  Result Value Ref Range   WBC 9.7 4.0 - 10.5 K/uL   RBC 3.33 (L) 3.87 - 5.11 MIL/uL   Hemoglobin 9.9 (L) 12.0 - 15.0 g/dL   HCT 31.6 (L) 36.0 - 46.0 %   MCV 94.9 78.0 - 100.0 fL   MCH 29.7 26.0 - 34.0 pg   MCHC 31.3 30.0 - 36.0 g/dL   RDW 15.9 (H) 11.5 - 15.5 %   Platelets 376 150 - 400 K/uL    Comment: Performed at [facility], [place] [address] [place], [place] [address]  Creatinine, serum     Status: None   Collection Time: 09/03/17  4:09 PM  Result Value Ref Range   Creatinine, Ser 0.50 0.44 - 1.00 mg/dL   GFR calc non Af Amer >60 >60 mL/min   GFR calc Af Amer >60 >60 mL/min    Comment: (NOTE) The eGFR has been calculated using the CKD EPI equation. This calculation has not been validated in all clinical situations. eGFR's persistently <60 mL/min signify possible Chronic Kidney Disease. Performed at [facility], [place] [address] [place], [place] [address]   Glucose, capillary     Status: Abnormal   Collection Time: 09/03/17  5:01 PM  Result Value Ref Range   Glucose-Capillary 187 (H) 65 - 99 mg/dL  Glucose, capillary     Status: Abnormal   Collection Time: 09/03/17  7:41 PM  Result Value Ref Range   Glucose-Capillary 115 (H) 65 - 99 mg/dL  Glucose, capillary     Status: Abnormal   Collection Time: 09/03/17 11:50 PM  Result Value Ref Range   Glucose-Capillary 150 (H) 65 - 99 mg/dL  Glucose, capillary     Status: Abnormal   Collection Time: 09/04/17  3:55 AM   Result Value Ref Range   Glucose-Capillary 130 (H) 65 - 99 mg/dL  CBC WITH DIFFERENTIAL     Status: Abnormal   Collection Time: 09/04/17  6:55 AM  Result Value Ref Range   WBC 8.8 4.0 - 10.5 K/uL   RBC 3.12 (L) 3.87 - 5.11 MIL/uL   Hemoglobin 9.2 (L) 12.0 - 15.0 g/dL   HCT 29.1 (L) 36.0 - 46.0 %   MCV 93.3 78.0 - 100.0 fL   MCH 29.5 26.0 - 34.0 pg   MCHC 31.6 30.0 - 36.0 g/dL   RDW 15.7 (H) 11.5 - 15.5 %   Platelets 370 150 - 400 K/uL   Neutrophils Relative % 59 %   Neutro Abs 5.3 1.7 - 7.7 K/uL   Lymphocytes Relative 33 %   Lymphs Abs 2.9 0.7 - 4.0 K/uL   Monocytes Relative 5 %   Monocytes Absolute 0.4 0.1 - 1.0 K/uL   Eosinophils Relative 2 %   Eosinophils Absolute 0.2 0.0 - 0.7 K/uL   Basophils Relative 1 %   Basophils Absolute 0.0 0.0 - 0.1 K/uL   Immature Granulocytes 0 %   Abs Immature Granulocytes 0.0 0.0 - 0.1 K/uL    Comment:  Performed at [facility], [place] [address] [place], [place] [address]  Comprehensive metabolic panel     Status: Abnormal   Collection Time: 09/04/17  6:55 AM  Result Value Ref Range   Sodium 145 135 - 145 mmol/L   Potassium 3.1 (L) 3.5 - 5.1 mmol/L   Chloride 105 101 - 111 mmol/L   CO2 30 22 - 32 mmol/L   Glucose, Bld 150 (H) 65 - 99 mg/dL   BUN 11 6 - 20 mg/dL   Creatinine, Ser 0.49 0.44 - 1.00 mg/dL   Calcium 9.6 8.9 - 10.3 mg/dL   Total Protein 6.6 6.5 - 8.1 g/dL   Albumin 2.9 (L) 3.5 - 5.0 g/dL   AST 26 15 - 41 U/L   ALT 24 14 - 54 U/L   Alkaline Phosphatase 113 38 - 126 U/L   Total Bilirubin 0.3 0.3 - 1.2 mg/dL   GFR calc non Af Amer >60 >60 mL/min   GFR calc Af Amer >60 >60 mL/min    Comment: (NOTE) The eGFR has been calculated using the CKD EPI equation. This calculation has not been validated in all clinical situations. eGFR's persistently <60 mL/min signify possible Chronic Kidney Disease.    Anion gap 10 5 - 15    Comment: Performed at [facility] [address], [place], [place] [address]  Glucose,  capillary     Status: Abnormal   Collection Time: 09/04/17  8:11 AM  Result Value Ref Range   Glucose-Capillary 132 (H) 65 - 99 mg/dL  Glucose, capillary     Status: Abnormal   Collection Time: 09/04/17 11:44 AM  Result Value Ref Range   Glucose-Capillary 166 (H) 65 - 99 mg/dL  Glucose, capillary     Status: None   Collection Time: 09/04/17  4:43 PM  Result Value Ref Range   Glucose-Capillary 67 65 - 99 mg/dL  Glucose, capillary     Status: Abnormal   Collection Time: 09/04/17  7:35 PM  Result Value Ref Range   Glucose-Capillary 227 (H) 65 - 99 mg/dL   Comment 1 Notify RN   Glucose, capillary     Status: Abnormal   Collection Time: 09/05/17 12:23 AM  Result Value Ref Range   Glucose-Capillary 148 (H) 65 - 99 mg/dL   Comment 1 Document in Chart   Glucose, capillary     Status: Abnormal   Collection Time: 09/05/17  4:12 AM  Result Value Ref Range   Glucose-Capillary 101 (H) 65 - 99 mg/dL   Comment 1 Notify RN   Glucose, capillary     Status: None   Collection Time: 09/05/17  7:48 AM  Result Value Ref Range   Glucose-Capillary 94 65 - 99 mg/dL  Glucose, capillary     Status: None   Collection Time: 09/05/17 11:26 AM  Result Value Ref Range   Glucose-Capillary 86 65 - 99 mg/dL  Glucose, capillary     Status: None   Collection Time: 09/05/17  4:10 PM  Result Value Ref Range   Glucose-Capillary 87 65 - 99 mg/dL  Glucose, capillary     Status: Abnormal   Collection Time: 09/05/17  8:41 PM  Result Value Ref Range   Glucose-Capillary 204 (H) 65 - 99 mg/dL  Glucose, capillary     Status: Abnormal   Collection Time: 09/06/17  4:44 AM  Result Value Ref Range   Glucose-Capillary 150 (H) 65 - 99 mg/dL     HEENT: Feeding tube in  nares Cardio: RRR and no murmur Resp: CTA B/L and Unlabored GI: BS positive and NT, ND Extremity:  No Edema Skin:   Wound well healed incision Left crani, staples in Ventriculostomy placement site Neuro: Confused, Abnormal Sensory no response to pinch  RUE, withdraws and winces to pinch R toes, Abnormal Motor 0/5 RUE and RLE, at least 4/5 on Left side, Abnormal FMC Ataxic/ dec FMC, Dysarthric and Aphasic Musc/Skel:  Other no pain with UE or LE ROM Gen NAD   Assessment/Plan: 1. 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 Physiatrist is providing close team supervision and 24 hour management of active medical problems listed below. Physiatrist and rehab team continue to assess barriers to discharge/monitor patient progress toward functional and medical goals. FIM: Function - Bathing Position: Sitting EOB Body parts bathed by helper: Right arm, Left arm, Chest, Abdomen, Front perineal area, Buttocks, Right upper leg, Left upper leg, Right lower leg, Left lower leg, Back Assist Level: 2 helpers  Function- Upper Body Dressing/Undressing What is the patient wearing?: Pull over shirt/dress Pull over shirt/dress - Perfomed by helper: Thread/unthread right sleeve, Thread/unthread left sleeve, Put head through opening, Pull shirt over trunk Assist Level: 2 helpers Function - Lower Body Dressing/Undressing What is the patient wearing?: Pants Position: Wheelchair/chair at sink Pants- Performed by helper: Thread/unthread right pants leg, Thread/unthread left pants leg, Pull pants up/down Non-skid slipper socks- Performed by helper: Don/doff right sock, Don/doff left sock Assist for footwear: Dependant Assist for lower body dressing: 2 Helpers  Function - Toileting Toileting activity did not occur: No continent bowel/bladder event  Function - Air cabin crew transfer activity did not occur: Safety/medical concerns  Function - Chair/bed transfer Chair/bed transfer method: Stand pivot Chair/bed transfer assist level: Total assist (Pt < 25%) Chair/bed transfer assistive device: Mechanical  lift Mechanical lift: Stedy Chair/bed transfer details: Manual facilitation for weight shifting, Manual facilitation for placement, Verbal cues for technique, Verbal cues for precautions/safety  Function - Locomotion: Wheelchair Will patient use wheelchair at discharge?: Yes Type: Manual Max wheelchair distance: 150 ft Assist Level: Dependent (Pt equals 0%) Assist Level: Dependent (Pt equals 0%) Assist Level: Dependent (Pt equals 0%) Turns around,maneuvers to table,bed, and toilet,negotiates 3% grade,maneuvers on rugs and over doorsills: No Function - Locomotion: Ambulation Ambulation activity did not occur: Safety/medical concerns  Function - Comprehension Comprehension: Auditory Comprehension assist level: Understands basic 25 - 49% of the time/ requires cueing 50 - 75% of the time  Function - Expression Expression: Verbal Expression assist level: Expresses basis less than 25% of the time/requires cueing >75% of the time.  Function - Social Interaction Social Interaction assist level: Interacts appropriately less than 25% of the time. May be withdrawn or combative.  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 SLP- staples  out 6/10 or 6/11 2. DVT Prophylaxis/Anticoagulation: Subcutaneous heparin. LE vascular study  pnd 3. Pain Management:Tylenol as needed 4. Mood:Provide emotional support 5. Neuropsych: This patientis notcapable of making decisions on herown behalf. 6. Skin/Wound Care:Routine skin checks 7. Fluids/Electrolytes/Nutrition:Routine in and outs with follow-up chemistries -NGT feedings, adjust rate/H20 as needed- dietary consult  8.Dysphagia. Dysphagia #1 nectar liquids. Nasogastric tube feeds for nutritional support 9.Hyperglycemia induced from tube feeds. Continue Lantus insulin for coverage. 10.Hypertension. Lopressor 50 mg twice daily Vitals:   09/05/17 1939 09/06/17 0556  BP: 119/78 128/78  Pulse: (!) 104 (!) 107  Resp: 18 18  Temp:  98 F (36.7 C)  SpO2: 98%   Controlled 6/7 11.  Vasospasm prophyllaxis-.Nimodipine60 mg every 4 hours x21 days initiated 08/15/2017-completed 6/6 12.   LOS (Days) 3 A FACE TO FACE EVALUATION WAS PERFORMED  Charlett Blake 09/06/2017, 7:24 AM

## 2017-09-06 NOTE — Significant Event (Signed)
Pt refused 0400 insulin; RN educated pt on reason for insulin, but pt remained combative and repeatedly stated "don't" and "stop" as RN attempted to administer dose; will continue to monitor

## 2017-09-06 NOTE — Progress Notes (Signed)
*  Preliminary Results* Bilateral lower extremity venous duplex completed. Bilateral lower extremities are negative for deep vein thrombosis. There is no evidence of Baker's cyst bilaterally.  09/06/2017 11:50 AM Gertie FeyMichelle Abad Manard, BS, RVT, RDCS, RDMS

## 2017-09-06 NOTE — Progress Notes (Signed)
Occupational Therapy Session Note  Patient Details  Name: Carla Bauer MRN: [id number] Date of Birth: June 13, 1955  Today's Date: 09/06/2017 OT Individual Time: 6754-4920 OT Individual Time Calculation (min): 71 min   Short Term Goals: Week 1:  OT Short Term Goal 1 (Week 1): Pt will sit EOB with no more than mod A while participating in BADL task OT Short Term Goal 2 (Week 1): Pt will locate 1/4 grooming items at the sink with mod instructional cues OT Short Term Goal 3 (Week 1): Pt will complete 2/10 bathing steps to decrease caregiver burder. OT Short Term Goal 4 (Week 1): Pt will complete toilet transfer with max +2 assist  Skilled Therapeutic Interventions/Progress Updates:    Pt greeted asleep in bed upon OT arrival. Pt required encouragement to participate. Pt came to sitting EOB with Max A +2. Pt able to achieve sitting balance by holding onto bed footboard on pt's left, but without that support, she demonstrated lateral lean and push to the R. Sit<>stand in [place] with max A +2 and max verbal cues to initiate stand. Pt noted to be incontinent of bowel and bladder which was soaked through her brief and all over the bed/floor. Pt tolerated standing in [place] for ~ 1 minute with rehab tech providing max A for balance, while OT completed peri-care and brief change with total A. Pt brought to the sink in wc for bathing/dressing tasks. Worked on initiation, sequencing, and R attention within BADLs. Pt resistant to hand over hand A to initiate or finish BADL tasks. Pt at one time stated " Don't come near me again" as she has poor frustration tolerance for OTs cuing. Max A to thread B LEs into pants, then Mod A to stand in stedy, but +2 needed for balance while second person assisted with pulling pants over hips. Pt left seated in TIS wc at end of session with safety belt, chair alarm, and needs met.  Therapy Documentation Precautions:  Precautions Precautions: Fall Restrictions Weight Bearing  Restrictions: No Pain:  none/denies pain ADL: ADL ADL Comments: Please see functional navigator  See Function Navigator for Current Functional Status.   Therapy/Group: Individual Therapy  Valma Cava 09/06/2017, 2:09 PM

## 2017-09-06 NOTE — Progress Notes (Signed)
Physical Therapy Session Note  Patient Details  Name: Carla Bauer MRN: [id number] Date of Birth: 29-Dec-1955  Today's Date: 09/06/2017 PT Individual Time: 1000-1100 PT Individual Time Calculation: 60 min   Short Term Goals: Week 1:  PT Short Term Goal 1 (Week 1): Pt will perform bed mobility with mod assist PT Short Term Goal 2 (Week 1): Pt will perform sit<>stand with mod assist PT Short Term Goal 3 (Week 1): Pt will initiate gait training PT Short Term Goal 4 (Week 1): Pt will perform bed<>chair transfer with max assist +1  Skilled Therapeutic Interventions/Progress Updates:    no c/o pain.  Session focus on attention to task, functional mobility, R attention, and midline orientation.   Pt transitions to EOB with HOB elevated and mod assist to elevate trunk, max multimodal cues for bringing LEs to EOB.  Sit<>stand throughout session in stedy with mod assist to rise, max multimodal cues for hand placement, forward weight shift, and midline orientation.  From seated in w/c, pt completes face washing at the sink with hand over hand assist to attend to R side of face.  Attempted sit<>stand at rail, but pt resisting assist to stand.  Transition to ortho gym for quiet environment and pt transferred to therapy mat with stedy for time management.  Seated edge of mat, with wall on L side as end point, focus on maintaining midline with cues for bringing L shoulder to the wall.  Pt able to maintain static sitting balance with overall close supervision, but occasional mod assist with cues for L shoulder to wall.  Attempted to engage pt in reaching task for trunk elongation and upright posture, but pt continually refusing.  Returned to w/c with stedy at end of session and positioned in room with 1/2 lap tray in place, chair alarm activated, and call bell in reach.   Therapy Documentation Precautions:  Precautions Precautions: Fall Restrictions Weight Bearing Restrictions: No   See Function  Navigator for Current Functional Status.   Therapy/Group: Individual Therapy  Stephania FragminCaitlin E Shandricka Monroy 09/06/2017, 4:20 PM

## 2017-09-06 NOTE — Progress Notes (Signed)
Nutrition Follow-up  DOCUMENTATION CODES:   Not applicable  INTERVENTION:  Continue tube feedings infusing over 16 hours to allow for po encouragement at breakfast and lunch: Osmolite 1.5 formula via Cortrak NGT at goal rate of 65 ml/hr x 16 hours (4pm-8am).  Provide 30 ml Prostat per tube BID.   Continue free water flushes of 400 ml q 6 hours. MD to adjust free water as appropriate.  Tube feeding regimen provides 1760 kcal (100% of needs), 95 grams of protein, 2390 ml water.   Provide Magic cup TID with meals, each supplement provides 290 kcal and 9 grams of protein.  NUTRITION DIAGNOSIS:   Inadequate oral intake related to dysphagia as evidenced by meal completion < 25%; ongoing  GOAL:   Patient will meet greater than or equal to 90% of their needs; met via TF  MONITOR:   PO intake, Supplement acceptance, Diet advancement, Weight trends, Labs, TF tolerance, Skin, I & O's  REASON FOR ASSESSMENT:   Consult Enteral/tube feeding initiation and management  ASSESSMENT:   62 year old female with history of headaches. Presented 08/14/2017 after being found down by her family with right sided weakness. Cranial CT scan showed left temporal hemorrhage. Underwent left craniotomy for clipping of aneurysm, evacuation of left temporal hematoma placement of right frontal external ventricular drain. Diet has been advanced to a dysphagia #1 nectar thick liquid as well as the nasogastric tube remains in place for nutritional support.   Pt at procedure during attempted time of visit. Meal completion has been 0-15% with only 5% at lunch today. PO intake continues to be poor. Will continue with current tube feeding orders to aid in adequate nutrition. Noted weight has been very gradually decreasing. RD to increase Prostat to BID to aid in caloric and protein needs. RD to continue to monitor for tolerance.   Labs and medications reviewed.   Diet Order:   Diet Order           DIET - DYS 1  Room service appropriate? Yes; Fluid consistency: Nectar Thick  Diet effective now          EDUCATION NEEDS:   Not appropriate for education at this time  Skin:  Skin Assessment: Skin Integrity Issues: Skin Integrity Issues:: Incisions Incisions: head  Last BM:  6/7  Height:   Ht Readings from Last 1 Encounters:  09/05/17 5' 1"  (1.549 m)    Weight:   Wt Readings from Last 1 Encounters:  09/06/17 144 lb 10 oz (65.6 kg)    Ideal Body Weight:  47.7 kg  BMI:  Body mass index is 27.33 kg/m.  Estimated Nutritional Needs:   Kcal:  1650-1850  Protein:  80-95 grams  Fluid:  Per MD    Corrin Parker, MS, RD, LDN Pager # [phone] After hours/ weekend pager # [phone]

## 2017-09-06 NOTE — IPOC Note (Signed)
Overall Plan of Care [facility](IPOC) Patient Details Name: Carla Bauer MRN: [id number] DOB: 1955-07-24  Admitting Diagnosis: <principal problem not specified>  Hospital Problems: Active Problems:   Intraparenchymal hematoma of brain (HCC)   Right hemiparesis (HCC)   Oropharyngeal dysphagia   Essential hypertension     Functional Problem List: Nursing Bladder, Bowel, Endurance, Safety, Perception  PT Balance, Safety, Behavior, Sensory, Endurance, Motor, Nutrition, Pain, Perception  OT Balance, Cognition, Endurance, Motor, Nutrition, Perception, Safety, Sensory, Vision  SLP Cognition, Linguistic, Nutrition, Safety  TR         Basic ADL's: OT Eating, Grooming, Bathing, Toileting, Dressing     Advanced  ADL's: OT       Transfers: PT Bed Mobility, Bed to Chair, Car, Furniture, Dietitianloor  OT Tub/Shower, Technical brewerToilet     Locomotion: PT Ambulation, Psychologist, prison and probation servicesWheelchair Mobility, Stairs     Additional Impairments: OT Fuctional Use of Upper Extremity  SLP Swallowing, Communication, Social Cognition comprehension, expression Attention  TR      Anticipated Outcomes Item Anticipated Outcome  Self Feeding Min A  Swallowing  Min assist    Basic self-care  Min A  Toileting  Min A   Bathroom Transfers Min A  Bowel/Bladder  (foley catheter in place incontinent of bowel)  Transfers  min assist  Locomotion  mod assist  Communication  Mod assist   Cognition  Mod assist   Pain  <3/10  Safety/Judgment  (poor safety awareness and judgement)   Therapy Plan: PT Intensity: Minimum of 1-2 x/day ,45 to 90 minutes PT Frequency: 5 out of 7 days PT Duration Estimated Length of Stay: 4 weeks OT Intensity: Minimum of 1-2 x/day, 45 to 90 minutes OT Frequency: 5 out of 7 days OT Duration/Estimated Length of Stay: 4-5 weeks SLP Intensity: Minumum of 1-2 x/day, 30 to 90 minutes SLP Frequency: 3 to 5 out of 7 days SLP Duration/Estimated Length of Stay: 28 days     Team Interventions: Nursing  Interventions Patient/Family Education, Bowel Management, Pain Management, Medication Management, Skin Care/Wound Management, Cognitive Remediation/Compensation, Dysphagia/Aspiration Precaution Training, Discharge Planning, Psychosocial Support  PT interventions Ambulation/gait training, Community reintegration, Fish farm managerDME/adaptive equipment instruction, Neuromuscular re-education, Stair training, UE/LE Strength taining/ROM, Wheelchair propulsion/positioning, Warden/rangerBalance/vestibular training, Discharge planning, Functional electrical stimulation, Pain management, Skin care/wound management, Therapeutic Activities, UE/LE Coordination activities, Cognitive remediation/compensation, Disease management/prevention, Functional mobility training, Patient/family education, Splinting/orthotics, Therapeutic Exercise  OT Interventions Warden/rangerBalance/vestibular training, Cognitive remediation/compensation, Community reintegration, Discharge planning, DME/adaptive equipment instruction, Functional electrical stimulation, Functional mobility training, Neuromuscular re-education, Patient/family education, Psychosocial support, Self Care/advanced ADL retraining, Splinting/orthotics, Therapeutic Activities, Therapeutic Exercise, UE/LE Strength taining/ROM, UE/LE Coordination activities, Visual/perceptual remediation/compensation, Wheelchair propulsion/positioning  SLP Interventions Cognitive remediation/compensation, Financial traderCueing hierarchy, Dysphagia/aspiration precaution training, Functional tasks, Environmental controls, Internal/external aids, Patient/family education, Speech/Language facilitation, Multimodal communication approach  TR Interventions    SW/CM Interventions Discharge Planning, Psychosocial Support, Patient/Family Education   Barriers to Discharge MD  Medical stability, Incontinence, Wound care and Nutritional means  Nursing Incontinence(family dynamics) (family dynamics)  PT Decreased caregiver support, Behavior    OT       SLP      SW Insurance for SNF coverage, Medication compliance Was not seeing a PCP and has no Forensic scientistinsurance   Team Discharge Planning: Destination: PT-Home ,OT- Home , SLP-Home Projected Follow-up: PT-Home health PT, 24 hour supervision/assistance, OT-  Home health OT, SLP-Home Health SLP, Outpatient SLP, Skilled Nursing facility, 24 hour supervision/assistance Projected Equipment Needs: PT-To be determined, OT- To be determined, SLP-To be determined Equipment Details: PT- , OT-  Patient/family involved in discharge planning: PT- Patient unable/family or caregiver not available,  OT-Patient unable/family or caregiver not available, SLP-Patient unable/family or caregive not available  MD ELOS: 23-27d Medical Rehab Prognosis:  Good Assessment:  62 year old right-handed female with history of headaches. Per chart review, patient lives with her daughter. Family arranging care as needed on discharge. 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 Presented 08/14/2017 after being found down by her family with right sided weakness. Cranial CT scan showed left temporal hemorrhage. Per report 4.2 x 3.4 x 2.1 intraparenchymal hematoma in the medial left temporal lobe with intraventricular penetration filling of the left lateral ventricle with blood. Mass-effect with right to left shift of 11 mm. 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 Underwent left craniotomy for clipping of aneurysm, evacuation of left temporal hematoma placement of right frontal external ventricular drain 08/14/2017 per Dr. Conchita Paris.Multiple attempts made to wean her ventricular catheter unsuccessfully and underwent placement of laparoscopic assisted right ventricular peritoneal shunt 08/29/2017 per Dr. Conchita Paris. Hospital course UTI treated with Rocephin. Renal ultrasound was  completed for UTI showing no hydronephrosis. Diet has been advanced to a dysphagia #1 nectar thick liquid as well as the nasogastric tube remains in place for nutritional support. Subcutaneous heparin initiated for DVT prophylaxis 08/19/2017   Now requiring 24/7 Rehab RN,MD, as well as CIR level PT, OT and SLP.  Treatment team will focus on ADLs and mobility with goals set at Min/Mod A    See Team Conference Notes for weekly updates to the plan of care

## 2017-09-07 ENCOUNTER — Inpatient Hospital Stay (HOSPITAL_COMMUNITY): Payer: Medicaid Other | Admitting: Speech Pathology

## 2017-09-07 ENCOUNTER — Inpatient Hospital Stay (HOSPITAL_COMMUNITY): Payer: Self-pay

## 2017-09-07 ENCOUNTER — Inpatient Hospital Stay (HOSPITAL_COMMUNITY): Payer: Self-pay | Admitting: Physical Therapy

## 2017-09-07 LAB — GLUCOSE, CAPILLARY
GLUCOSE-CAPILLARY: 157 mg/dL — AB (ref 65–99)
GLUCOSE-CAPILLARY: 169 mg/dL — AB (ref 65–99)
Glucose-Capillary: 134 mg/dL — ABNORMAL HIGH (ref 65–99)
Glucose-Capillary: 143 mg/dL — ABNORMAL HIGH (ref 65–99)
Glucose-Capillary: 171 mg/dL — ABNORMAL HIGH (ref 65–99)
Glucose-Capillary: 75 mg/dL (ref 65–99)

## 2017-09-07 MED ORDER — TRAZODONE HCL 50 MG PO TABS
25.0000 mg | ORAL_TABLET | Freq: Four times a day (QID) | ORAL | Status: DC | PRN
  Administered 2017-09-07 – 2017-09-10 (×2): 25 mg via ORAL
  Filled 2017-09-07 (×2): qty 1

## 2017-09-07 NOTE — Progress Notes (Signed)
Speech Language Pathology Daily Session Note  Patient Details  Name: Arvid RightDorothy A Wisenbaker MRN: [id number] Date of Birth: Feb 03, 1956  Today's Date: 09/07/2017 SLP Individual Time: 0825-0905 SLP Individual Time Calculation (min): 40 min and Today's Date: 09/07/2017 SLP Missed Time: 20 Minutes Missed Time Reason: Patient fatigue  Short Term Goals: Week 1: SLP Short Term Goal 1 (Week 1): Pt will focus her attention to a targeted stimulus in 75% of opportunities with max assist multimodal cues.   SLP Short Term Goal 2 (Week 1): Pt will follow 1 step commands during basic, structured tasks for 75% accuracy with max assist multimodal cues.  SLP Short Term Goal 3 (Week 1): Pt will locate items at midline in 50% of opportunities during basic, structured tasks with max assist multimodal cues.  SLP Short Term Goal 4 (Week 1): Pt will consume dys 1 textures and nectar thick liquids with max assist multimodal cues for use of swallowing precautions and no overt s/s of aspiration.  SLP Short Term Goal 5 (Week 1): Pt will verbally respond to basic, personally, relevant questions in 25% of opportunities with max assist multimodal cues.   Skilled Therapeutic Interventions: Skilled treatment session focused on cognitive and dysphagia goals. Upon arrival, patient had been incontinent of bowel and bladder with no awareness upon questioning. SLP facilitated session by providing extra time and overall Mod A verbal and tactile cues for patient to follow commands during bed mobility and pericare. SLP also facilitated session by providing more than a reasonable amount of time and Max A multimodal cues for patient to initiate self-feeding. Clinician would scoop bolus onto the utensil and the patient would self-feed with Max A verbal cues due to resistance with hand over hand assist. Patient only consumed 1 bite of her snack of Dys. 1 textures despite Max encouragement and multiple attempts but did not demonstrate any overt s/s of  aspiration despite what appeared to be a delayed swallow initiation. Patient answering basic yes/no questions and able to express her wants/needs ("I don't want no more"). Patient became lethargic and was consistently falling asleep during session despite Max A multimodal cues. Patient could only maintain alertness for ~30 second intervals, therefore, session ended 20 minutes early. Patient left semi-reclined in bed with alarm on and all needs within reach. Continue with current plan of care.        Function:  Eating Eating   Modified Consistency Diet: Yes Eating Assist Level: Helper scoops food on utensil;Set up assist for;Supervision or verbal cues   Eating Set Up Assist For: Opening containers       Cognition Comprehension Comprehension assist level: Understands basic 25 - 49% of the time/ requires cueing 50 - 75% of the time  Expression   Expression assist level: Expresses basis less than 25% of the time/requires cueing >75% of the time.  Social Interaction Social Interaction assist level: Interacts appropriately less than 25% of the time. May be withdrawn or combative.  Problem Solving Problem solving assist level: Solves basic less than 25% of the time - needs direction nearly all the time or does not effectively solve problems and may need a restraint for safety  Memory Memory assist level: Recognizes or recalls less than 25% of the time/requires cueing greater than 75% of the time    Pain No/Denies Pain   Therapy/Group: Individual Therapy  Ana Woodroof 09/07/2017, 9:08 AM

## 2017-09-07 NOTE — Progress Notes (Addendum)
Patient combative to staff ; will not let blood sugar be checked ; patient ate pudding and magic cup.

## 2017-09-07 NOTE — Progress Notes (Addendum)
Patient has been combative trying to take out ngt and her brief. Cussing staff. Placing left leg over the rail Mitts placed. Middle setting for bed alarm. Dr. Riley KillSwartz notified new order noted..Marland Kitchen

## 2017-09-07 NOTE — Progress Notes (Addendum)
Patient is still combative & verbally aggressive, cursing, & swinging at staff when care rendered. Distraction attempted, but not successful. Two staff needed for safety of staff. Patient allowed to calm down before attempting to give meds or other care at this time. Cortrak tube is in place & tube feeding is in progress, she has unfastened her brief on the left side. Will continue to monitor

## 2017-09-07 NOTE — Progress Notes (Signed)
Subjective/Complaints: No issues overnight.  Patient slept well and appears comfortable this morning   ROS: limited due to language/communication   Objective: Vital Signs: Blood pressure (!) 148/97, pulse (!) 113, temperature 98.4 F (36.9 C), temperature source Axillary, resp. rate 18, height 5\' 1"  (1.549 m), weight 66.1 kg (145 lb 11.6 oz), SpO2 100 %. No results found. Results for orders placed or performed during the hospital encounter of 09/03/17 (from the past 72 hour(s))  Glucose, capillary     Status: Abnormal   Collection Time: 09/04/17 11:44 AM  Result Value Ref Range   Glucose-Capillary 166 (H) 65 - 99 mg/dL  Glucose, capillary     Status: None   Collection Time: 09/04/17  4:43 PM  Result Value Ref Range   Glucose-Capillary 67 65 - 99 mg/dL  Glucose, capillary     Status: Abnormal   Collection Time: 09/04/17  7:35 PM  Result Value Ref Range   Glucose-Capillary 227 (H) 65 - 99 mg/dL   Comment 1 Notify RN   Glucose, capillary     Status: Abnormal   Collection Time: 09/05/17 12:23 AM  Result Value Ref Range   Glucose-Capillary 148 (H) 65 - 99 mg/dL   Comment 1 Document in Chart   Glucose, capillary     Status: Abnormal   Collection Time: 09/05/17  4:12 AM  Result Value Ref Range   Glucose-Capillary 101 (H) 65 - 99 mg/dL   Comment 1 Notify RN   Glucose, capillary     Status: None   Collection Time: 09/05/17  7:48 AM  Result Value Ref Range   Glucose-Capillary 94 65 - 99 mg/dL  Glucose, capillary     Status: None   Collection Time: 09/05/17 11:26 AM  Result Value Ref Range   Glucose-Capillary 86 65 - 99 mg/dL  Glucose, capillary     Status: None   Collection Time: 09/05/17  4:10 PM  Result Value Ref Range   Glucose-Capillary 87 65 - 99 mg/dL  Glucose, capillary     Status: Abnormal   Collection Time: 09/05/17  8:41 PM  Result Value Ref Range   Glucose-Capillary 204 (H) 65 - 99 mg/dL  Glucose, capillary     Status: Abnormal   Collection Time: 09/06/17 12:19  AM  Result Value Ref Range   Glucose-Capillary 128 (H) 65 - 99 mg/dL   Comment 1 QC Due   Glucose, capillary     Status: Abnormal   Collection Time: 09/06/17  4:44 AM  Result Value Ref Range   Glucose-Capillary 150 (H) 65 - 99 mg/dL  Glucose, capillary     Status: Abnormal   Collection Time: 09/06/17  9:02 AM  Result Value Ref Range   Glucose-Capillary 113 (H) 65 - 99 mg/dL  Glucose, capillary     Status: None   Collection Time: 09/06/17 11:52 AM  Result Value Ref Range   Glucose-Capillary 87 65 - 99 mg/dL  Glucose, capillary     Status: None   Collection Time: 09/06/17  4:19 PM  Result Value Ref Range   Glucose-Capillary 87 65 - 99 mg/dL  Glucose, capillary     Status: Abnormal   Collection Time: 09/06/17  7:58 PM  Result Value Ref Range   Glucose-Capillary 167 (H) 65 - 99 mg/dL  Glucose, capillary     Status: Abnormal   Collection Time: 09/07/17 12:20 AM  Result Value Ref Range   Glucose-Capillary 134 (H) 65 - 99 mg/dL   Comment 1 Notify RN  Glucose, capillary     Status: Abnormal   Collection Time: 09/07/17  4:07 AM  Result Value Ref Range   Glucose-Capillary 143 (H) 65 - 99 mg/dL   Comment 1 Notify RN   Glucose, capillary     Status: Abnormal   Collection Time: 09/07/17  8:11 AM  Result Value Ref Range   Glucose-Capillary 171 (H) 65 - 99 mg/dL     Constitutional: No distress . Vital signs reviewed. HEENT: EOMI, oral membranes moist. NGT Neck: supple Cardiovascular: RRR without murmur. No JVD    Respiratory: CTA Bilaterally without wheezes or rales. Normal effort    GI: BS +, non-tender, non-distended  Extremity:  No Edema Skin:   Wound well healed incision Left crani, staples in Ventriculostomy placement site Neuro: Confused, Abnormal Sensory no response to pinch RUE, withdraws and winces to pinch R toes, Abnormal Motor 0/5 RUE and RLE, at least 4/5 on Left side, Abnormal FMC Ataxic/ dec FMC, Dysarthric and Aphasic Musc/Skel:  Other no pain with UE or LE ROM Gen  NAD   Assessment/Plan: 1. 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 Physiatrist is providing close team supervision and 24 hour management of active medical problems listed below. Physiatrist and rehab team continue to assess barriers to discharge/monitor patient progress toward functional and medical goals. FIM: Function - Bathing Position: Wheelchair/chair at sink Body parts bathed by patient: Chest, Abdomen Body parts bathed by helper: Right arm, Left arm, Front perineal area, Buttocks, Right upper leg, Left upper leg, Right lower leg, Left lower leg Assist Level: 2 helpers  Function- Upper Body Dressing/Undressing What is the patient wearing?: Hospital gown Pull over shirt/dress - Perfomed by helper: Thread/unthread right sleeve, Thread/unthread left sleeve, Put head through opening, Pull shirt over trunk Assist Level: 2 helpers Function - Lower Body Dressing/Undressing What is the patient wearing?: Non-skid slipper socks, Pants Position: Wheelchair/chair at sink Pants- Performed by helper: Thread/unthread right pants leg, Pull pants up/down, Thread/unthread left pants leg Non-skid slipper socks- Performed by helper: Don/doff left sock, Don/doff right sock Assist for footwear: Dependant Assist for lower body dressing: 2 Helpers  Function - Toileting Toileting activity did not occur: No continent bowel/bladder event Toileting steps completed by helper: Adjust clothing prior to toileting, Performs perineal hygiene, Adjust clothing after toileting(per Brianna Collinson, NT report) Assist level: Two helpers(per Rona Ravensiera Craven, NT report)  Function - ArchivistToilet Transfers Toilet transfer activity did not occur: Safety/medical concerns Toilet transfer assistive device: Mechanical lift(per Rona Ravensiera Craven, NT report) Assist level to toilet: 2  helpers(per Brianna Collinson, NT report)  Function - Chair/bed transfer Chair/bed transfer method: Stand pivot Chair/bed transfer assist level: Total assist (Pt < 25%) Chair/bed transfer assistive device: Mechanical lift Mechanical lift: Stedy Chair/bed transfer details: Manual facilitation for weight shifting, Manual facilitation for placement, Verbal cues for technique, Verbal cues for precautions/safety  Function - Locomotion: Wheelchair Will patient use wheelchair at discharge?: Yes Type: Manual Max wheelchair distance: 150 ft Assist Level: Dependent (Pt equals 0%) Assist Level: Dependent (Pt equals 0%) Assist Level: Dependent (Pt equals 0%) Turns around,maneuvers to table,bed, and toilet,negotiates 3% grade,maneuvers on rugs and over doorsills: No Function - Locomotion: Ambulation Ambulation activity did not occur: Safety/medical concerns  Function - Comprehension Comprehension: Auditory Comprehension assist level: Understands basic 25 - 49% of the time/ requires cueing 50 - 75% of the time  Function - Expression Expression: Verbal  Expression assist level: Expresses basis less than 25% of the time/requires cueing >75% of the time.  Function - Social Interaction Social Interaction assist level: Interacts appropriately less than 25% of the time. May be withdrawn or combative.  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 SLP- staples out 6/10 or 6/11 2. DVT Prophylaxis/Anticoagulation: Subcutaneous heparin. LE vascular study negative 3. Pain Management:Tylenol as needed 4. Mood:Provide emotional support 5. Neuropsych: This patientis notcapable of making decisions on herown behalf. 6. Skin/Wound Care:Routine skin checks 7. Fluids/Electrolytes/Nutrition:Routine in and outs with follow-up chemistries -NGT feedings, adjust rate/H20 as needed- dietary consult  8.Dysphagia. Dysphagia #1 nectar liquids. Nasogastric tube feeds for nutritional support 9.Hyperglycemia induced from tube feeds. Continue Lantus insulin for coverage. 10.Hypertension. Lopressor 50 mg twice daily Vitals:   09/06/17 1946 09/07/17 0412  BP: 127/84 (!) 148/97  Pulse: (!) 105 (!) 113  Resp: 18 18  Temp: 98 F (36.7 C) 98.4 F (36.9 C)  SpO2: 99% 100%  Fair to borderline control as of 6/8.  Keep an eye on diastolic blood pressure 11.  Vasospasm prophyllaxis-.Nimodipine60 mg every 4 hours x21 days initiated 08/15/2017-completed 6/6 12.   LOS (Days) 4 A FACE TO FACE EVALUATION WAS PERFORMED  Ranelle Oyster 09/07/2017, 8:46 AM

## 2017-09-07 NOTE — Plan of Care (Signed)
  Problem: RH BOWEL ELIMINATION Goal: RH STG MANAGE BOWEL WITH ASSISTANCE Description STG Manage Bowel with Assistance. Mod  Outcome: Not Progressing; incontinence   Problem: RH BLADDER ELIMINATION Goal: RH STG MANAGE BLADDER WITH ASSISTANCE Description STG Manage Bladder With Assistance. Mod  Outcome: Not Progressing; incontinence   Problem: RH COGNITION-NURSING Goal: RH STG USES MEMORY AIDS/STRATEGIES W/ASSIST TO PROBLEM SOLVE Description STG Uses Memory Aids/Strategies With mod  Assistance to Problem Solve. Cues and supervision   Outcome: Not Progressing; confused

## 2017-09-07 NOTE — Progress Notes (Signed)
Patient combative and refuses to eat.

## 2017-09-07 NOTE — Progress Notes (Signed)
Occupational Therapy Session Note  Patient Details  Name: Carla Bauer MRN: [id number] Date of Birth: 04-29-1955  Today's Date: 09/07/2017 OT Individual Time: 1100-1200 OT Individual Time Calculation (min): 60 min    Short Term Goals: Week 1:  OT Short Term Goal 1 (Week 1): Pt will sit EOB with no more than mod A while participating in BADL task OT Short Term Goal 2 (Week 1): Pt will locate 1/4 grooming items at the sink with mod instructional cues OT Short Term Goal 3 (Week 1): Pt will complete 2/10 bathing steps to decrease caregiver burder. OT Short Term Goal 4 (Week 1): Pt will complete toilet transfer with max +2 assist  Skilled Therapeutic Interventions/Progress Updates:    1:1. Pt only oriented to self this session despite cueing/reorientation. OT threads scrub pants with total A and pt able to bridge LLE to pull pants up L hip. PT rolls L with MAX Afor OT to advance past R hip. Pt completes squat pivot transfer to R with EOB<>w/c with +2 A for going to chair and max A of 1 going back to bed at end of session with gait belt. Pt requires cues for hand placment and anterior weight shifting. At sink pt requires HOH A to initiate most movements. Question apraxia v motivation. Pt states, "I dont have time for this" but not willing to state what she needs to do. Pt able to wash face, chest and stomach with increased time and tactile cues to wash R side. Pt threads shirt with A to thread RUE, head and pull shirt down R side. Attempted to engage pt in towel glides using BUE for R attention, weight bearing and NMR however pt requires max A to complete motions d/t lack of participation despite education on benefits. Pt however looks out window in R visual field and comments on weather. Pt returned back to room to bed as stated above and OT exits with call light in reach and bed exit alarm on.   Therapy Documentation Precautions:  Precautions Precautions: Fall Restrictions Weight Bearing  Restrictions: No General:    See Function Navigator for Current Functional Status.   Therapy/Group: Individual Therapy  Shon HaleStephanie M Christin Moline 09/07/2017, 12:07 PM

## 2017-09-07 NOTE — Progress Notes (Signed)
Physical Therapy Session Note  Patient Details  Name: Carla Bauer MRN: [id number] Date of Birth: 05-22-1955  Today's Date: 09/07/2017 PT Individual Time: 1545-1630 PT Individual Time Calculation (min): 45 min  and Today's Date: 09/07/2017 PT Missed Time: 30 Minutes Missed Time Reason: Patient unwilling to participate  Short Term Goals: Week 1:  PT Short Term Goal 1 (Week 1): Pt will perform bed mobility with mod assist PT Short Term Goal 2 (Week 1): Pt will perform sit<>stand with mod assist PT Short Term Goal 3 (Week 1): Pt will initiate gait training PT Short Term Goal 4 (Week 1): Pt will perform bed<>chair transfer with max assist +1  Skilled Therapeutic Interventions/Progress Updates:   Pt in supine and appeared agreeable to therapy, no c/o pain throughout session. Pt initiated transition to EOB w/ min cues, carried out task w/ max-total assist. Performed stedy transfer to TIS w/c w/ increased time 2/2 initiation cues required to perform sit<>stands to stedy. Total assist w/c transport to gym to work on sit<>stands to RW at which point pt refused further participation. Spent next 25 minutes attempting to convince pt to participate in therapy. When asked why she does not participate she states "I want to go home" and "it won't do any good". She does not respond when educated on stroke recovery and ability to regain meaningful function. Stated that she may need to d/c to SNF if she does not participate as her daughter cannot provide the amount of care she needs, she responded "I won't go to no nursing home, my daughter will take care of me". Pt adamantly refused any activity despite multiple options given. Returned to room and performed stedy transfer back to bed, which she initiated w/o cues, ended session in supine and in care of RN, all needs met.   Therapy Documentation Precautions:  Precautions Precautions: Fall Restrictions Weight Bearing Restrictions: No General: PT Amount of  Missed Time (min): 30 Minutes PT Missed Treatment Reason: Patient unwilling to participate Vital Signs: Therapy Vitals Temp: 97.6 F (36.4 C) Temp Source: Axillary Pulse Rate: 97 Resp: 16 BP: 117/84 Patient Position (if appropriate): Lying Oxygen Therapy SpO2: 100 % O2 Device: Room Air  See Function Navigator for Current Functional Status.   Therapy/Group: Individual Therapy  [name] K Arnette 09/07/2017, 5:00 PM

## 2017-09-08 ENCOUNTER — Inpatient Hospital Stay (HOSPITAL_COMMUNITY): Payer: Self-pay

## 2017-09-08 LAB — GLUCOSE, CAPILLARY
GLUCOSE-CAPILLARY: 76 mg/dL (ref 65–99)
Glucose-Capillary: 109 mg/dL — ABNORMAL HIGH (ref 65–99)
Glucose-Capillary: 125 mg/dL — ABNORMAL HIGH (ref 65–99)
Glucose-Capillary: 141 mg/dL — ABNORMAL HIGH (ref 65–99)
Glucose-Capillary: 156 mg/dL — ABNORMAL HIGH (ref 65–99)

## 2017-09-08 MED ORDER — QUETIAPINE FUMARATE 50 MG PO TABS
50.0000 mg | ORAL_TABLET | Freq: Every day | ORAL | Status: DC
  Administered 2017-09-08: 50 mg
  Filled 2017-09-08: qty 1

## 2017-09-08 MED ORDER — QUETIAPINE FUMARATE 25 MG PO TABS
25.0000 mg | ORAL_TABLET | Freq: Three times a day (TID) | ORAL | Status: DC | PRN
  Administered 2017-09-08 – 2017-09-16 (×3): 25 mg
  Filled 2017-09-08 (×3): qty 1

## 2017-09-08 NOTE — Progress Notes (Signed)
Subjective/Complaints: Patient more irritable and aggressive overnight and into this morning.  Patient not letting staff work with her.  ROS: Limited due to cognitive/behavioral   Objective: Vital Signs: Blood pressure 116/83, pulse (!) 105, temperature 98.1 F (36.7 C), temperature source Axillary, resp. rate 16, height 5\' 1"  (1.549 m), weight 67 kg (147 lb 11.3 oz), SpO2 100 %. No results found. Results for orders placed or performed during the hospital encounter of 09/03/17 (from the past 72 hour(s))  Glucose, capillary     Status: None   Collection Time: 09/05/17 11:26 AM  Result Value Ref Range   Glucose-Capillary 86 65 - 99 mg/dL  Glucose, capillary     Status: None   Collection Time: 09/05/17  4:10 PM  Result Value Ref Range   Glucose-Capillary 87 65 - 99 mg/dL  Glucose, capillary     Status: Abnormal   Collection Time: 09/05/17  8:41 PM  Result Value Ref Range   Glucose-Capillary 204 (H) 65 - 99 mg/dL  Glucose, capillary     Status: Abnormal   Collection Time: 09/06/17 12:19 AM  Result Value Ref Range   Glucose-Capillary 128 (H) 65 - 99 mg/dL   Comment 1 QC Due   Glucose, capillary     Status: Abnormal   Collection Time: 09/06/17  4:44 AM  Result Value Ref Range   Glucose-Capillary 150 (H) 65 - 99 mg/dL  Glucose, capillary     Status: Abnormal   Collection Time: 09/06/17  9:02 AM  Result Value Ref Range   Glucose-Capillary 113 (H) 65 - 99 mg/dL  Glucose, capillary     Status: None   Collection Time: 09/06/17 11:52 AM  Result Value Ref Range   Glucose-Capillary 87 65 - 99 mg/dL  Glucose, capillary     Status: None   Collection Time: 09/06/17  4:19 PM  Result Value Ref Range   Glucose-Capillary 87 65 - 99 mg/dL  Glucose, capillary     Status: Abnormal   Collection Time: 09/06/17  7:58 PM  Result Value Ref Range   Glucose-Capillary 167 (H) 65 - 99 mg/dL  Glucose, capillary     Status: Abnormal   Collection Time: 09/07/17 12:20 AM  Result Value Ref Range   Glucose-Capillary 134 (H) 65 - 99 mg/dL   Comment 1 Notify RN   Glucose, capillary     Status: Abnormal   Collection Time: 09/07/17  4:07 AM  Result Value Ref Range   Glucose-Capillary 143 (H) 65 - 99 mg/dL   Comment 1 Notify RN   Glucose, capillary     Status: Abnormal   Collection Time: 09/07/17  8:11 AM  Result Value Ref Range   Glucose-Capillary 171 (H) 65 - 99 mg/dL  Glucose, capillary     Status: None   Collection Time: 09/07/17 12:26 PM  Result Value Ref Range   Glucose-Capillary 75 65 - 99 mg/dL  Glucose, capillary     Status: Abnormal   Collection Time: 09/07/17  4:51 PM  Result Value Ref Range   Glucose-Capillary 157 (H) 65 - 99 mg/dL  Glucose, capillary     Status: Abnormal   Collection Time: 09/07/17  7:56 PM  Result Value Ref Range   Glucose-Capillary 169 (H) 65 - 99 mg/dL   Comment 1 Notify RN   Glucose, capillary     Status: Abnormal   Collection Time: 09/08/17 12:32 AM  Result Value Ref Range   Glucose-Capillary 156 (H) 65 - 99 mg/dL   Comment 1 Notify  RN   Glucose, capillary     Status: Abnormal   Collection Time: 09/08/17  4:01 AM  Result Value Ref Range   Glucose-Capillary 141 (H) 65 - 99 mg/dL   Comment 1 Notify RN   Glucose, capillary     Status: Abnormal   Collection Time: 09/08/17  8:26 AM  Result Value Ref Range   Glucose-Capillary 109 (H) 65 - 99 mg/dL     Constitutional: No distress . Vital signs reviewed. HEENT: EOMI, oral membranes moist Neck: supple Cardiovascular: RRR without murmur. No JVD    Respiratory: CTA Bilaterally without wheezes or rales. Normal effort    GI: BS +, non-tender, non-distended  Extremity:  No Edema Skin:   Wound well healed incision Left crani, staples in Ventriculostomy placement site Neuro:confused, distracted.  Abnormal Sensory no response to pinch RUE, withdraws and winces to pinch R toes, Abnormal Motor 0/5 RUE and RLE, at least 4/5 on Left side, Abnormal FMC Ataxic/ dec FMC, Dysarthric and Aphasic Musc/Skel:   Other no pain with UE or LE ROM Gen NAD   Assessment/Plan: 1. 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 Physiatrist is providing close team supervision and 24 hour management of active medical problems listed below. Physiatrist and rehab team continue to assess barriers to discharge/monitor patient progress toward functional and medical goals. FIM: Function - Bathing Position: Wheelchair/chair at sink Body parts bathed by patient: Chest, Abdomen Body parts bathed by helper: Right arm, Left arm, Front perineal area, Buttocks, Right upper leg, Left upper leg, Right lower leg, Left lower leg Assist Level: 2 helpers  Function- Upper Body Dressing/Undressing What is the patient wearing?: Hospital gown Pull over shirt/dress - Perfomed by helper: Thread/unthread right sleeve, Thread/unthread left sleeve, Put head through opening, Pull shirt over trunk Assist Level: 2 helpers Function - Lower Body Dressing/Undressing What is the patient wearing?: Non-skid slipper socks, Pants Position: Wheelchair/chair at sink Pants- Performed by helper: Thread/unthread right pants leg, Pull pants up/down, Thread/unthread left pants leg Non-skid slipper socks- Performed by helper: Don/doff left sock, Don/doff right sock Assist for footwear: Dependant Assist for lower body dressing: 2 Helpers  Function - Toileting Toileting activity did not occur: No continent bowel/bladder event Toileting steps completed by helper: Adjust clothing prior to toileting, Performs perineal hygiene, Adjust clothing after toileting(per Brianna Collinson, NT report) Assist level: Two helpers(per Rona Ravensiera Craven, NT report)  Function - ArchivistToilet Transfers Toilet transfer activity did not occur: Safety/medical concerns Toilet transfer assistive device: Mechanical lift(per Rona Ravensiera Craven, NT  report) Assist level to toilet: 2 helpers(per Brianna Collinson, NT report)  Function - Chair/bed transfer Chair/bed transfer method: Stand pivot Chair/bed transfer assist level: dependent (Pt equals 0%) Chair/bed transfer assistive device: Mechanical lift Mechanical lift: Stedy Chair/bed transfer details: Manual facilitation for weight shifting, Manual facilitation for placement, Verbal cues for technique, Verbal cues for precautions/safety  Function - Locomotion: Wheelchair Will patient use wheelchair at discharge?: Yes Type: Manual Max wheelchair distance: 150 ft Assist Level: Dependent (Pt equals 0%) Assist Level: Dependent (Pt equals 0%) Assist Level: Dependent (Pt equals 0%) Turns around,maneuvers to table,bed, and toilet,negotiates 3% grade,maneuvers on rugs and over doorsills: No Function - Locomotion: Ambulation Ambulation activity did not occur: Safety/medical concerns  Function - Comprehension Comprehension: Auditory Comprehension assist level: Understands basic 25 - 49% of the time/ requires cueing 50 - 75% of the time  Function - Expression  Expression: Verbal Expression assist level: Expresses basis less than 25% of the time/requires cueing >75% of the time.  Function - Social Interaction Social Interaction assist level: Interacts appropriately less than 25% of the time. May be withdrawn or combative.  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 SLP- staples out 6/10 or 6/11 2. DVT Prophylaxis/Anticoagulation: Subcutaneous heparin. LE vascular study negative 3. Pain Management:Tylenol as needed 4. Mood:Provide emotional support  -Add scheduled Seroquel at night  -Add as needed Seroquel for agitation.  Want to avoid oversedation 5. Neuropsych: This patientis notcapable of making decisions on herown behalf. 6. Skin/Wound Care:Routine skin checks 7. Fluids/Electrolytes/Nutrition:Routine in and outs with follow-up chemistries -NGT feedings, adjust rate/H20 as needed- dietary consult  8.Dysphagia. Dysphagia #1 nectar liquids. Nasogastric tube feeds for nutritional support 9.Hyperglycemia induced from tube feeds. Continue Lantus insulin for coverage. 10.Hypertension. Lopressor 50 mg twice daily Vitals:   09/08/17 0034 09/08/17 0408  BP:  116/83  Pulse:  (!) 105  Resp:  16  Temp: 97.9 F (36.6 C) 98.1 F (36.7 C)  SpO2:  100%  Fair  control as of 6/9.  Keep an eye on diastolic blood pressure 11.  Vasospasm prophyllaxis-.Nimodipine60 mg every 4 hours x21 days initiated 08/15/2017-completed 6/6 12.   LOS (Days) 5 A FACE TO FACE EVALUATION WAS PERFORMED  Ranelle Oyster 09/08/2017, 8:39 AM

## 2017-09-08 NOTE — Progress Notes (Signed)
Occupational Therapy Note  Patient Details  Name: Carla Bauer MRN: [id number] Date of Birth: October 14, 1955  Today's Date: 09/08/2017 OT Missed Time: 60 Minutes Missed Time Reason: Patient unwilling/refused to participate without medical reason  Attempted to engage pt in tx. Offered bathing/dressing, therex, transfer/standing work and toileting. Pt states, "does it look like I want to get dressed?" and "You cant help me." Attempted to orient and reeducate pt about participating in tx, however no evidence of learning. Attempted to follow up 30 min after refusal however pt sound asleep and tube feed running. Pt missed 60 min skilled OT.    Elenore PaddyStephanie M Ayham Word 09/08/2017, 4:23 PM

## 2017-09-08 NOTE — Progress Notes (Signed)
Patient was given the rest of her night meds & was noted to have been wet at approximately 2030. Her bladder was scanned as per instructions given in report about patient's bladder not emptying. Bladder was scanned & scan volume was over . Patient was in/out catheterized at that time. Patient was less combative at that time, but still verbally challenging to staff. Hygiene care was given, call bell placed within reach, mouth care attempted. Patient would not open her mouth. Other staff member was present for safety. Pillows placed for comfort & to float heels as well as elevate right arm. No acute distress noted.

## 2017-09-08 NOTE — Progress Notes (Signed)
Patient is verbally abusive; swinging at staff; will not let staff to check her blood sugar; swinging her leg over the side rail;  Safety camera ordered; mats on the floor ; MD made aware.

## 2017-09-08 NOTE — Plan of Care (Signed)
  Problem: RH BOWEL ELIMINATION Goal: RH STG MANAGE BOWEL WITH ASSISTANCE Description STG Manage Bowel with Assistance. Mod  Outcome: Not Progressing; incontinence   Problem: RH BLADDER ELIMINATION Goal: RH STG MANAGE BLADDER WITH ASSISTANCE Description STG Manage Bladder With Assistance. Mod  Outcome: Not Progressing; in and out cath q 8 hours   Problem: RH COGNITION-NURSING Goal: RH STG USES MEMORY AIDS/STRATEGIES W/ASSIST TO PROBLEM SOLVE Description STG Uses Memory Aids/Strategies With mod  Assistance to Problem Solve. Cues and supervision   Outcome: Not Progressing; combative, uncooperative

## 2017-09-09 ENCOUNTER — Inpatient Hospital Stay (HOSPITAL_COMMUNITY): Payer: Self-pay

## 2017-09-09 ENCOUNTER — Inpatient Hospital Stay (HOSPITAL_COMMUNITY): Payer: Self-pay | Admitting: Occupational Therapy

## 2017-09-09 ENCOUNTER — Inpatient Hospital Stay (HOSPITAL_COMMUNITY): Payer: Medicaid Other | Admitting: Speech Pathology

## 2017-09-09 LAB — IRON AND TIBC
IRON: 53 ug/dL (ref 28–170)
Saturation Ratios: 13 % (ref 10.4–31.8)
TIBC: 399 ug/dL (ref 250–450)
UIBC: 346 ug/dL

## 2017-09-09 LAB — GLUCOSE, CAPILLARY
GLUCOSE-CAPILLARY: 144 mg/dL — AB (ref 65–99)
GLUCOSE-CAPILLARY: 155 mg/dL — AB (ref 65–99)
GLUCOSE-CAPILLARY: 156 mg/dL — AB (ref 65–99)
GLUCOSE-CAPILLARY: 162 mg/dL — AB (ref 65–99)
GLUCOSE-CAPILLARY: 165 mg/dL — AB (ref 65–99)
Glucose-Capillary: 82 mg/dL (ref 65–99)
Glucose-Capillary: 81 mg/dL (ref 65–99)

## 2017-09-09 MED ORDER — QUETIAPINE FUMARATE 25 MG PO TABS
25.0000 mg | ORAL_TABLET | Freq: Every day | ORAL | Status: DC
  Administered 2017-09-09 – 2017-09-26 (×18): 25 mg
  Filled 2017-09-09 (×18): qty 1

## 2017-09-09 NOTE — Progress Notes (Signed)
Physical Therapy Session Note  Patient Details  Name: Carla Bauer MRN: [id number] Date of Birth: June 07, 1955  Today's Date: 09/09/2017 PT Individual Time: 1500-1530 PT Individual Time Calculation (min): 30 min  and Today's Date: 09/09/2017 PT Missed Time: 30 Minutes Missed Time Reason: Patient unwilling to participate  Short Term Goals: Week 1:  PT Short Term Goal 1 (Week 1): Pt will perform bed mobility with mod assist PT Short Term Goal 2 (Week 1): Pt will perform sit<>stand with mod assist PT Short Term Goal 3 (Week 1): Pt will initiate gait training PT Short Term Goal 4 (Week 1): Pt will perform bed<>chair transfer with max assist +1  Skilled Therapeutic Interventions/Progress Updates:  Therapist attempted to see pt this AM, pt resistant to tx and pushing therapist away. Pt pulling covers back over herself and closing her eyes. This therapist will attempt to see pt at a later time.  Pt missed 30 minutes of skilled therapy tx secondary to refusal/agitation.   Therapist returned to see pt. Pt initially combative and resistant to therapy tx stating "you can't help me" and "I don't want to work with you." Pt beginning to engage in therapy tx as NT (Renee) assisted in encouraging pt. Pt transported throughout the unit working on attention, following commands and orientation. Pt continues to ignore therapist and only responding to NT, therapist directing tx session through NT. Pt reads signs in hallway spontaneously but will not read anything on command secondary to behavior. Pt able to name color of her socks, pants, grass and NT's shirt. Pt worked on tracking to midline by following NT's face and hand. Pt engaging in conversation, states she has two daughters and [facility]. When therapist comes into visual field on L, pt reports "I do not like her, the one in the blue shirt." Pt transported back to nurses station and left seated in TIS with  QRB in place and chair alarm set.     Therapy Documentation Precautions:  Precautions Precautions: Fall Restrictions Weight Bearing Restrictions: No   See Function Navigator for Current Functional Status.   Therapy/Group: Individual Therapy  Cresenciano GenreEmily van Schagen, PT, DPT 09/09/2017, 7:45 AM

## 2017-09-09 NOTE — Progress Notes (Signed)
Speech Language Pathology Daily Session Note  Patient Details  Name: Carla Bauer MRN: [id number] Date of Birth: [birth year]  Today's Date: 09/09/2017 SLP Individual Time: 1415-1440 SLP Individual Time Calculation (min): 25 min  Missed Time: 20 minutes, patient unwilling to participate   Short Term Goals: Week 1: SLP Short Term Goal 1 (Week 1): Pt will focus her attention to a targeted stimulus in 75% of opportunities with max assist multimodal cues.   SLP Short Term Goal 2 (Week 1): Pt will follow 1 step commands during basic, structured tasks for 75% accuracy with max assist multimodal cues.  SLP Short Term Goal 3 (Week 1): Pt will locate items at midline in 50% of opportunities during basic, structured tasks with max assist multimodal cues.  SLP Short Term Goal 4 (Week 1): Pt will consume dys 1 textures and nectar thick liquids with max assist multimodal cues for use of swallowing precautions and no overt s/s of aspiration.  SLP Short Term Goal 5 (Week 1): Pt will verbally respond to basic, personally, relevant questions in 25% of opportunities with max assist multimodal cues.   Skilled Therapeutic Interventions: Skilled treatment session focused on cognitive goals. Upon arrival, patient was sitting upright in the wheelchair at the RN station and appeared restless. Patient declined all functional tasks despite Max A multimodal cues and became verbally frustrated with clinician. She appeared confabulatory but was difficult to understand due to suspected language impairments consisting of jargon. Patient unable to be redirected and was swatting at clinician when clinician attempted to push the patient back to her room. Session ended early due to agitation. Patient left at RN station with quick release belt in place. Continue with current plan of care.      Function:   Cognition Comprehension Comprehension assist level: Understands basic 25 - 49% of the time/ requires cueing 50 - 75% of the  time  Expression   Expression assist level: Expresses basis less than 25% of the time/requires cueing >75% of the time.  Social Interaction Social Interaction assist level: Interacts appropriately less than 25% of the time. May be withdrawn or combative.  Problem Solving Problem solving assist level: Solves basic less than 25% of the time - needs direction nearly all the time or does not effectively solve problems and may need a restraint for safety  Memory Memory assist level: Recognizes or recalls less than 25% of the time/requires cueing greater than 75% of the time    Pain No/Denies Pain   Therapy/Group: Individual Therapy  Yuvin Bussiere 09/09/2017, 3:03 PM

## 2017-09-09 NOTE — Progress Notes (Signed)
Subjective/Complaints:  Difficult to awaken, but still pushed PT away during attempts at mobilization  ROS: Limited due to cognitive/behavioral   Objective: Vital Signs: Blood pressure 110/83, pulse (!) 111, temperature (!) 97.5 F (36.4 C), temperature source Oral, resp. rate 12, height 5\' 1"  (1.549 m), weight 64.9 kg (143 lb 1.3 oz), SpO2 99 %. No results found. Results for orders placed or performed during the hospital encounter of 09/03/17 (from the past 72 hour(s))  Glucose, capillary     Status: Abnormal   Collection Time: 09/06/17  9:02 AM  Result Value Ref Range   Glucose-Capillary 113 (H) 65 - 99 mg/dL  Glucose, capillary     Status: None   Collection Time: 09/06/17 11:52 AM  Result Value Ref Range   Glucose-Capillary 87 65 - 99 mg/dL  Glucose, capillary     Status: None   Collection Time: 09/06/17  4:19 PM  Result Value Ref Range   Glucose-Capillary 87 65 - 99 mg/dL  Glucose, capillary     Status: Abnormal   Collection Time: 09/06/17  7:58 PM  Result Value Ref Range   Glucose-Capillary 167 (H) 65 - 99 mg/dL  Glucose, capillary     Status: Abnormal   Collection Time: 09/07/17 12:20 AM  Result Value Ref Range   Glucose-Capillary 134 (H) 65 - 99 mg/dL   Comment 1 Notify RN   Glucose, capillary     Status: Abnormal   Collection Time: 09/07/17  4:07 AM  Result Value Ref Range   Glucose-Capillary 143 (H) 65 - 99 mg/dL   Comment 1 Notify RN   Glucose, capillary     Status: Abnormal   Collection Time: 09/07/17  8:11 AM  Result Value Ref Range   Glucose-Capillary 171 (H) 65 - 99 mg/dL  Glucose, capillary     Status: None   Collection Time: 09/07/17 12:26 PM  Result Value Ref Range   Glucose-Capillary 75 65 - 99 mg/dL  Glucose, capillary     Status: Abnormal   Collection Time: 09/07/17  4:51 PM  Result Value Ref Range   Glucose-Capillary 157 (H) 65 - 99 mg/dL  Glucose, capillary     Status: Abnormal   Collection Time: 09/07/17  7:56 PM  Result Value Ref Range    Glucose-Capillary 169 (H) 65 - 99 mg/dL   Comment 1 Notify RN   Glucose, capillary     Status: Abnormal   Collection Time: 09/08/17 12:32 AM  Result Value Ref Range   Glucose-Capillary 156 (H) 65 - 99 mg/dL   Comment 1 Notify RN   Glucose, capillary     Status: Abnormal   Collection Time: 09/08/17  4:01 AM  Result Value Ref Range   Glucose-Capillary 141 (H) 65 - 99 mg/dL   Comment 1 Notify RN   Glucose, capillary     Status: Abnormal   Collection Time: 09/08/17  8:26 AM  Result Value Ref Range   Glucose-Capillary 109 (H) 65 - 99 mg/dL  Glucose, capillary     Status: Abnormal   Collection Time: 09/08/17 11:30 AM  Result Value Ref Range   Glucose-Capillary 125 (H) 65 - 99 mg/dL  Glucose, capillary     Status: None   Collection Time: 09/08/17  4:21 PM  Result Value Ref Range   Glucose-Capillary 76 65 - 99 mg/dL  Glucose, capillary     Status: Abnormal   Collection Time: 09/08/17  9:20 PM  Result Value Ref Range   Glucose-Capillary 155 (H) 65 - 99  mg/dL  Glucose, capillary     Status: Abnormal   Collection Time: 09/09/17 12:08 AM  Result Value Ref Range   Glucose-Capillary 162 (H) 65 - 99 mg/dL  Glucose, capillary     Status: Abnormal   Collection Time: 09/09/17  4:41 AM  Result Value Ref Range   Glucose-Capillary 144 (H) 65 - 99 mg/dL     Constitutional: No distress . Vital signs reviewed. HEENT: EOMI, oral membranes moist Neck: supple Cardiovascular: RRR without murmur. No JVD    Respiratory: CTA Bilaterally without wheezes or rales. Normal effort    GI: BS +, non-tender, non-distended  Extremity:  No Edema Skin:   Wound well healed incision Left crani, staples in Ventriculostomy placement site Neuro:confused, distracted.  Abnormal Sensory no response to pinch RUE, withdraws and winces to pinch R toes, Abnormal Motor 0/5 RUE and RLE, at least 4/5 on Left side, Abnormal FMC Ataxic/ dec FMC, Dysarthric and Aphasic Musc/Skel:  Other no pain with UE or LE ROM Gen  NAD   Assessment/Plan: 1. 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 Physiatrist is providing close team supervision and 24 hour management of active medical problems listed below. Physiatrist and rehab team continue to assess barriers to discharge/monitor patient progress toward functional and medical goals. FIM: Function - Bathing Position: Wheelchair/chair at sink Body parts bathed by patient: Chest, Abdomen Body parts bathed by helper: Right arm, Left arm, Front perineal area, Buttocks, Right upper leg, Left upper leg, Right lower leg, Left lower leg Assist Level: 2 helpers  Function- Upper Body Dressing/Undressing What is the patient wearing?: Hospital gown Pull over shirt/dress - Perfomed by helper: Thread/unthread right sleeve, Thread/unthread left sleeve, Put head through opening, Pull shirt over trunk Assist Level: 2 helpers Function - Lower Body Dressing/Undressing What is the patient wearing?: Non-skid slipper socks, Pants Position: Wheelchair/chair at sink Pants- Performed by helper: Thread/unthread right pants leg, Pull pants up/down, Thread/unthread left pants leg Non-skid slipper socks- Performed by helper: Don/doff left sock, Don/doff right sock Assist for footwear: Dependant Assist for lower body dressing: 2 Helpers  Function - Toileting Toileting activity did not occur: No continent bowel/bladder event Toileting steps completed by helper: Adjust clothing prior to toileting, Performs perineal hygiene, Adjust clothing after toileting(per Brianna Collinson, NT report) Assist level: Two helpers(per Rona Ravens, NT report)  Function - Archivist transfer activity did not occur: Safety/medical concerns Toilet transfer assistive device: Mechanical lift(per Rona Ravens, NT report) Assist level to toilet: 2  helpers(per Brianna Collinson, NT report)  Function - Chair/bed transfer Chair/bed transfer method: Stand pivot Chair/bed transfer assist level: dependent (Pt equals 0%) Chair/bed transfer assistive device: Mechanical lift Mechanical lift: Stedy Chair/bed transfer details: Manual facilitation for weight shifting, Manual facilitation for placement, Verbal cues for technique, Verbal cues for precautions/safety  Function - Locomotion: Wheelchair Will patient use wheelchair at discharge?: Yes Type: Manual Max wheelchair distance: 150 ft Assist Level: Dependent (Pt equals 0%) Assist Level: Dependent (Pt equals 0%) Assist Level: Dependent (Pt equals 0%) Turns around,maneuvers to table,bed, and toilet,negotiates 3% grade,maneuvers on rugs and over doorsills: No Function - Locomotion: Ambulation Ambulation activity did not occur: Safety/medical concerns  Function - Comprehension Comprehension: Auditory Comprehension assist level: Understands basic 25 - 49% of the time/ requires cueing 50 - 75% of the time  Function - Expression Expression: Verbal Expression assist level: Expresses basis less than  25% of the time/requires cueing >75% of the time.  Function - Social Interaction Social Interaction assist level: Interacts appropriately less than 25% of the time. May be withdrawn or combative.  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 SLP- staples out 6/10 2. DVT Prophylaxis/Anticoagulation: Subcutaneous heparin. LE vascular study negative 3. Pain Management:Tylenol as needed 4. Mood:Provide emotional support  -Add scheduled Seroquel at night- looks overly sedated today will reduce dose  To 12.5  -Add as needed Seroquel for agitation.  Want to avoid oversedation 5. Neuropsych: This patientis notcapable of making decisions on herown behalf. 6. Skin/Wound Care:Routine skin checks 7. Fluids/Electrolytes/Nutrition:Routine in and outs with follow-up chemistries -NGT feedings, adjust rate/H20 as needed- dietary consult  8.Dysphagia. Dysphagia #1 nectar liquids. Nasogastric tube feeds for nutritional support 9.Hyperglycemia induced from tube feeds. Continue Lantus insulin for coverage. CBG (last 3)  Recent Labs    09/08/17 2120 09/09/17 0008 09/09/17 0441  GLUCAP 155* 162* 144*  CBG elevated d/t TF but in range for hospital control 10.Hypertension. Lopressor 50 mg twice daily Vitals:   09/08/17 2053 09/09/17 0437  BP: 110/83 110/83  Pulse: 100 (!) 111  Resp: 15 12  Temp: 97.7 F (36.5 C) (!) 97.5 F (36.4 C)  SpO2: 97% 99%  BP control is good with mild tachycardia 11.  Anemia,Baseline Hgb ?11.8, had some post op blood loss, will check stool guaic, and Fe studies 12.  Agitation due to confusion , IPH, will reduce scheduled seroquel, appears more lethargic today LOS (Days) 6 A FACE TO FACE EVALUATION WAS PERFORMED  Erick Colace 09/09/2017, 8:01 AM
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
## 2017-09-09 NOTE — Progress Notes (Signed)
Occupational Therapy Session Note  Patient Details  Name: Carla Bauer MRN: [id number] Date of Birth: 1955-11-17  Today's Date: 09/09/2017 OT Individual Time: 1300-1345 OT Individual Time Calculation (min): 45 min    Short Term Goals: Week 1:  OT Short Term Goal 1 (Week 1): Pt will sit EOB with no more than mod A while participating in BADL task OT Short Term Goal 2 (Week 1): Pt will locate 1/4 grooming items at the sink with mod instructional cues OT Short Term Goal 3 (Week 1): Pt will complete 2/10 bathing steps to decrease caregiver burder. OT Short Term Goal 4 (Week 1): Pt will complete toilet transfer with max +2 assist  Skilled Therapeutic Interventions/Progress Updates:   Pt sitting in w/c with lunch. Session focused on emotional regulation, task initiation, visual scanning, and ADL transfers. Vc provided for midline orientation with both the core and head throughout session. With frequent vc pt able to bring can of V8 to mouth and take several sips with fair pacing, and experiencing 50% spillage. Pt doffed shirt with mod A, initiating task with only 2 vc. Pt was able to thread L UE through shirt and pull overhead with vc. Pt reported needing to use the bathroom. Pt used stedy to perform sit to stand with +2 max A. 2 vc required for command following to sit. Total A provided for peri-hygiene and clothing management for toileting. Vc/tactile cues provided for visual scanning, with poor success to R visual field. Attempted to perform oral hygiene, with pt adamantly refusing. Pt began attempting to pull out IV in R UE, this OT had to physically block IV and call for assistance. Pt swatting at OT and becoming verbally abusive. Pt left in w/c with RN staff present.    Therapy Documentation Precautions:  Precautions Precautions: Fall Restrictions Weight Bearing Restrictions: No  Pain: Pain Assessment Pain Score: 0-No pain Faces Pain Scale: No hurt ADL: ADL ADL Comments: Please  see functional navigator  See Function Navigator for Current Functional Status.   Therapy/Group: Individual Therapy  Crissie ReeseSandra H Lorrain Rivers 09/09/2017, 1:50 PM

## 2017-09-09 NOTE — Plan of Care (Signed)
  Problem: RH BLADDER ELIMINATION Goal: RH STG MANAGE BLADDER WITH ASSISTANCE Description STG Manage Bladder With Assistance. Mod  Outcome: Progressing  Staff scan/In/out cath per order q8hours   Problem: RH BLADDER ELIMINATION Goal: RH STG MANAGE BLADDER WITH EQUIPMENT WITH ASSISTANCE Description STG Manage Bladder With Equipment With Assistance. MOD  Outcome: Not Progressing  Not progressing r/t family has not been available to educate on the task and pt unable administer elimination of bladder at the time.

## 2017-09-09 NOTE — Progress Notes (Signed)
Staples 15 removed.

## 2017-09-10 ENCOUNTER — Inpatient Hospital Stay (HOSPITAL_COMMUNITY): Payer: Medicaid Other | Admitting: Speech Pathology

## 2017-09-10 ENCOUNTER — Inpatient Hospital Stay (HOSPITAL_COMMUNITY): Payer: Self-pay | Admitting: Physical Therapy

## 2017-09-10 ENCOUNTER — Inpatient Hospital Stay (HOSPITAL_COMMUNITY): Payer: Medicaid Other | Admitting: Occupational Therapy

## 2017-09-10 ENCOUNTER — Inpatient Hospital Stay (HOSPITAL_COMMUNITY): Payer: Self-pay | Admitting: Occupational Therapy

## 2017-09-10 LAB — GLUCOSE, CAPILLARY
GLUCOSE-CAPILLARY: 84 mg/dL (ref 65–99)
Glucose-Capillary: 104 mg/dL — ABNORMAL HIGH (ref 65–99)
Glucose-Capillary: 132 mg/dL — ABNORMAL HIGH (ref 65–99)
Glucose-Capillary: 151 mg/dL — ABNORMAL HIGH (ref 65–99)
Glucose-Capillary: 155 mg/dL — ABNORMAL HIGH (ref 65–99)
Glucose-Capillary: 182 mg/dL — ABNORMAL HIGH (ref 65–99)

## 2017-09-10 NOTE — Progress Notes (Signed)
Occupational Therapy Note  Patient Details  Name: Arvid RightDorothy A Charlot MRN: [id number] Date of Birth: Jun 06, 1955  Today's Date: 09/10/2017 OT Missed Time: 30 Minutes Missed Time Reason: Patient unwilling/refused to participate without medical reason  30 minute skilled co-treatment attempted with physical therapy with pt adamantly refusing and grabbing onto door frame when therapist to assist pt out of room. Pt remained in wheelchair with quick release belt donned and tilted back for safety. All needs within reach.     [name], Hadiya Spoerl P 09/10/2017, 3:48 PM

## 2017-09-10 NOTE — Progress Notes (Signed)
Subjective/Complaints:  Having some problems with cooperation.  Pt oriented to self  ROS: Limited due to cognitive/behavioral   Objective: Vital Signs: Blood pressure 111/74, pulse (!) 107, temperature 97.7 F (36.5 C), temperature source Oral, resp. rate 16, height 5\' 1"  (1.549 m), weight 65.1 kg (143 lb 8.3 oz), SpO2 98 %. No results found. Results for orders placed or performed during the hospital encounter of 09/03/17 (from the past 72 hour(s))  Glucose, capillary     Status: Abnormal   Collection Time: 09/07/17  8:11 AM  Result Value Ref Range   Glucose-Capillary 171 (H) 65 - 99 mg/dL  Glucose, capillary     Status: None   Collection Time: 09/07/17 12:26 PM  Result Value Ref Range   Glucose-Capillary 75 65 - 99 mg/dL  Glucose, capillary     Status: Abnormal   Collection Time: 09/07/17  4:51 PM  Result Value Ref Range   Glucose-Capillary 157 (H) 65 - 99 mg/dL  Glucose, capillary     Status: Abnormal   Collection Time: 09/07/17  7:56 PM  Result Value Ref Range   Glucose-Capillary 169 (H) 65 - 99 mg/dL   Comment 1 Notify RN   Glucose, capillary     Status: Abnormal   Collection Time: 09/08/17 12:32 AM  Result Value Ref Range   Glucose-Capillary 156 (H) 65 - 99 mg/dL   Comment 1 Notify RN   Glucose, capillary     Status: Abnormal   Collection Time: 09/08/17  4:01 AM  Result Value Ref Range   Glucose-Capillary 141 (H) 65 - 99 mg/dL   Comment 1 Notify RN   Glucose, capillary     Status: Abnormal   Collection Time: 09/08/17  8:26 AM  Result Value Ref Range   Glucose-Capillary 109 (H) 65 - 99 mg/dL  Glucose, capillary     Status: Abnormal   Collection Time: 09/08/17 11:30 AM  Result Value Ref Range   Glucose-Capillary 125 (H) 65 - 99 mg/dL  Glucose, capillary     Status: None   Collection Time: 09/08/17  4:21 PM  Result Value Ref Range   Glucose-Capillary 76 65 - 99 mg/dL  Glucose, capillary     Status: Abnormal   Collection Time: 09/08/17  9:20 PM  Result Value  Ref Range   Glucose-Capillary 155 (H) 65 - 99 mg/dL  Glucose, capillary     Status: Abnormal   Collection Time: 09/09/17 12:08 AM  Result Value Ref Range   Glucose-Capillary 162 (H) 65 - 99 mg/dL  Glucose, capillary     Status: Abnormal   Collection Time: 09/09/17  4:41 AM  Result Value Ref Range   Glucose-Capillary 144 (H) 65 - 99 mg/dL  Iron and TIBC     Status: None   Collection Time: 09/09/17  8:32 AM  Result Value Ref Range   Iron 53 28 - 170 ug/dL   TIBC 161 096 - 045 ug/dL   Saturation Ratios 13 10.4 - 31.8 %   UIBC 346 ug/dL    Comment: Performed at [facility], [address], [place], [place] [address]  Glucose, capillary     Status: Abnormal   Collection Time: 09/09/17  9:58 AM  Result Value Ref Range   Glucose-Capillary 156 (H) 65 - 99 mg/dL  Glucose, capillary     Status: None   Collection Time: 09/09/17  1:14 PM  Result Value Ref Range   Glucose-Capillary 82 65 - 99 mg/dL  Glucose, capillary  Status: None   Collection Time: 09/09/17  5:11 PM  Result Value Ref Range   Glucose-Capillary 81 65 - 99 mg/dL  Glucose, capillary     Status: Abnormal   Collection Time: 09/09/17  8:48 PM  Result Value Ref Range   Glucose-Capillary 165 (H) 65 - 99 mg/dL  Glucose, capillary     Status: Abnormal   Collection Time: 09/10/17 12:23 AM  Result Value Ref Range   Glucose-Capillary 155 (H) 65 - 99 mg/dL   Comment 1 Notify RN   Glucose, capillary     Status: Abnormal   Collection Time: 09/10/17  4:13 AM  Result Value Ref Range   Glucose-Capillary 132 (H) 65 - 99 mg/dL   Comment 1 Notify RN      Constitutional: No distress . Vital signs reviewed. HEENT: EOMI, oral membranes moist Neck: supple Cardiovascular: RRR without murmur. No JVD    Respiratory: CTA Bilaterally without wheezes or rales. Normal effort    GI: BS +, non-tender, non-distended  Extremity:  No Edema Skin:   Wound well healed incision Left crani, staples in Ventriculostomy placement  site Neuro:confused, distracted.  Abnormal Sensory no response to pinch RUE, withdraws and winces to pinch R toes, Abnormal Motor 0/5 RUE and RLE, at least 4/5 on Left side, Abnormal FMC Ataxic/ dec FMC, Dysarthric and Aphasic Musc/Skel:  Other no pain with UE or LE ROM Gen NAD   Assessment/Plan: 1. 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 Physiatrist is providing close team supervision and 24 hour management of active medical problems listed below. Physiatrist and rehab team continue to assess barriers to discharge/monitor patient progress toward functional and medical goals. FIM: Function - Bathing Position: Bed Body parts bathed by patient: Chest, Abdomen Body parts bathed by helper: Right arm, Left arm, Front perineal area, Buttocks, Right upper leg, Left upper leg, Right lower leg, Left lower leg, Chest, Abdomen Assist Level: 2 helpers  Function- Upper Body Dressing/Undressing What is the patient wearing?: Hospital gown Pull over shirt/dress - Perfomed by helper: Thread/unthread right sleeve, Thread/unthread left sleeve, Put head through opening, Pull shirt over trunk Assist Level: 2 helpers Function - Lower Body Dressing/Undressing What is the patient wearing?: Non-skid slipper socks, Pants Position: Wheelchair/chair at sink Pants- Performed by helper: Thread/unthread right pants leg, Pull pants up/down, Thread/unthread left pants leg Non-skid slipper socks- Performed by helper: Don/doff left sock, Don/doff right sock Assist for footwear: Dependant Assist for lower body dressing: 2 Helpers  Function - Toileting Toileting activity did not occur: No continent bowel/bladder event Toileting steps completed by helper: Adjust clothing prior to toileting, Performs perineal hygiene, Adjust clothing after toileting Assist level: Two  helpers  Function - Archivist transfer activity did not occur: Safety/medical concerns Toilet transfer assistive device: Systems developer lift: Stedy Assist level to toilet: 2 helpers Assist level from toilet: 2 helpers  Function - Chair/bed transfer Chair/bed transfer method: Stand pivot Chair/bed transfer assist level: dependent (Pt equals 0%) Chair/bed transfer assistive device: Mechanical lift Mechanical lift: Stedy Chair/bed transfer details: Manual facilitation for weight shifting, Manual facilitation for placement, Verbal cues for technique, Verbal cues for precautions/safety  Function - Locomotion: Wheelchair Will patient use wheelchair at discharge?: Yes Type: Manual Max wheelchair distance: 150 ft Assist Level: Dependent (Pt equals 0%) Assist Level: Dependent (Pt equals 0%) Assist Level: Dependent (Pt equals 0%) Turns around,maneuvers to  table,bed, and toilet,negotiates 3% grade,maneuvers on rugs and over doorsills: No Function - Locomotion: Ambulation Ambulation activity did not occur: Safety/medical concerns  Function - Comprehension Comprehension: Auditory Comprehension assist level: Understands basic 25 - 49% of the time/ requires cueing 50 - 75% of the time  Function - Expression Expression: Verbal Expression assist level: Expresses basis less than 25% of the time/requires cueing >75% of the time.  Function - Social Interaction Social Interaction assist level: Interacts appropriately less than 25% of the time. May be withdrawn or combative.  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 SLP- staples out 6/10, team conf in am 2. DVT Prophylaxis/Anticoagulation: Subcutaneous heparin. LE vascular study negative 3. Pain Management:Tylenol as needed 4. Mood:Provide emotional support  -Add scheduled Seroquel at night-reduced dose secondary to sedation 12.5  -Add as needed Seroquel for agitation.  Want to avoid oversedation 5. Neuropsych: This patientis notcapable of making decisions on herown behalf. 6. Skin/Wound Care:Routine skin checks 7. Fluids/Electrolytes/Nutrition:Routine in and outs with follow-up chemistries -NGT feedings, adjust rate/H20 as needed- dietary consult  8.Dysphagia. Dysphagia #1 nectar liquids. Nasogastric tube feeds for nutritional support 9.Hyperglycemia induced from tube feeds. Continue Lantus insulin for coverage. CBG (last 3)  Recent Labs    09/09/17 2048 09/10/17 0023 09/10/17 0413  GLUCAP 165* 155* 132*  Good range for hospital control 6/11 10.Hypertension. Lopressor 50 mg twice daily Vitals:   09/09/17 2053 09/10/17 0418  BP: 112/78 111/74  Pulse: (!) 124 (!) 107  Resp: 16 16  Temp: 98.6 F (37 C) 97.7 F (36.5 C)  SpO2: 98% 98%  BP control is good with mild tachycardia- exam recheck EKG- last one 5/15 showed NSR mild tachy  11.  Anemia,Hgb 9.2 Baseline Hgb ?11.8, had some post op blood loss, will check stool guaic, Normal Fe studies 6/10 12.  Agitation due to confusion , balancing control of agitation with sedation LOS (Days) 7 A FACE TO FACE EVALUATION WAS PERFORMED  Erick Colacendrew E Tavaris Eudy 09/10/2017, 7:16 AM

## 2017-09-10 NOTE — Progress Notes (Signed)
Speech Language Pathology Daily Session Notes  Patient Details  Name: Carla Bauer MRN: [id number] Date of Birth: Jul 10, 1955  Today's Date: 09/10/2017  Session 1: SLP Individual Time: 5621-30860725-0810 SLP Individual Time Calculation (min): 45 min   Session 2: SLP Individual Time: [id number] SLP Individual Time Calculation (min): 10 min   Short Term Goals: Week 1: SLP Short Term Goal 1 (Week 1): Pt will focus her attention to a targeted stimulus in 75% of opportunities with max assist multimodal cues.   SLP Short Term Goal 2 (Week 1): Pt will follow 1 step commands during basic, structured tasks for 75% accuracy with max assist multimodal cues.  SLP Short Term Goal 3 (Week 1): Pt will locate items at midline in 50% of opportunities during basic, structured tasks with max assist multimodal cues.  SLP Short Term Goal 4 (Week 1): Pt will consume dys 1 textures and nectar thick liquids with max assist multimodal cues for use of swallowing precautions and no overt s/s of aspiration.  SLP Short Term Goal 5 (Week 1): Pt will verbally respond to basic, personally, relevant questions in 25% of opportunities with max assist multimodal cues.   Skilled Therapeutic Interventions:  Session 1: Skilled treatment session focused on cognitive goals. Upon arrival, patient was awake while supine in bed. Patient was incontinent of urine and agreeable to allow care in order to change brief and transfer to the wheelchair. Patient followed 1 step commands with extra time and Mod A verbal cues during transfer [facility] the [facility] and self-care. Patient also answered all yes/no questions appropriately in regards to tray set-up. Patient consumed nectar-thick liquids without overt s/s of aspiration but required hand over hand assist for small sips in order to reduce right anterior spillage. Patient remained calm and cooperative throughout session although language of confusion present. Patient handed off to OT. Continue with current  plan of care.   Session 2: Skilled treatment session focused on cognitive and dysphagia goals. Patient declined her lunch meal despite Max A multimodal cues. Patient wold not allow clinician to feed the her or would not initiate self-feeding despite hand over hand assist. When clinician asked questions about preferences, patient reported, "you're wasting your time" and became verbally frustrated. Therefore, clinician left the session early. Patient left upright in wheelchair with RN outside of door. Continue with current plan of care.   Function:  Eating Eating   Modified Consistency Diet: Yes Eating Assist Level: Help managing cup/glass;Supervision or verbal cues   Eating Set Up Assist For: Opening containers       Cognition Comprehension Comprehension assist level: Understands basic 25 - 49% of the time/ requires cueing 50 - 75% of the time  Expression   Expression assist level: Expresses basis less than 25% of the time/requires cueing >75% of the time.  Social Interaction Social Interaction assist level: Interacts appropriately less than 25% of the time. May be withdrawn or combative.  Problem Solving Problem solving assist level: Solves basic less than 25% of the time - needs direction nearly all the time or does not effectively solve problems and may need a restraint for safety  Memory Memory assist level: Recognizes or recalls less than 25% of the time/requires cueing greater than 75% of the time    Pain No/Denies Pain   Therapy/Group: Individual Therapy  Hibo Blasdell 09/10/2017, 8:18 AM

## 2017-09-10 NOTE — Progress Notes (Signed)
At approximately 0320, the telesitter was heard telling the patient to stay in the bed. When this nurse came to the room, patient was trying to sit up & was removing her gown. Asked patient a few questions to see what her needs were but patient was repeating what was said & was resistant to assistance to reposition her, fix her gown, fix her pillow or cover her up with the blanket that she had also removed. Asked the patient if she was warm & she started talking about something & someone else. She was wide awake & seemed to be getting more agitated as she talked about this person. Her prn coverage for agitation was given with positive results. Tube feeding is still in progress through a cortrak tube, which was pinned to her gown. It was unpinned due to her pulling off her gown & pulling at the tube. It flushes well, no signs of respiratory distress. No signs of acute distress noted. All medications given as ordered & tolerated well. She was in/out catheterized earlier in the shift, even though she had an incontinent episode. She continues to retain urine. Will continue to monitor. She is sleeping at this time

## 2017-09-10 NOTE — Progress Notes (Signed)
Occupational Therapy Session Note  Patient Details  Name: Carla Bauer MRN: [id number] Date of Birth: 1955-09-13  Today's Date: 09/10/2017 OT Individual Time: 1300-1400 OT Individual Time Calculation (min): 60 min (make up time)   Short Term Goals: Week 1:  OT Short Term Goal 1 (Week 1): Pt will sit EOB with no more than mod A while participating in BADL task OT Short Term Goal 2 (Week 1): Pt will locate 1/4 grooming items at the sink with mod instructional cues OT Short Term Goal 3 (Week 1): Pt will complete 2/10 bathing steps to decrease caregiver burder. OT Short Term Goal 4 (Week 1): Pt will complete toilet transfer with max +2 assist  Skilled Therapeutic Interventions/Progress Updates:   Upon entering the room, pt seated in tilt and space and agreeable to leave room with therapist. OT assisted pt into day room via wheelchair. Pt became quickly agitated and pulling self away from therapist and throwing L UE towards therapist. OT utilized music to calm pt by playing her [facility]. Pt began to calm and humming and lightly tapping feet to music. Her daughter arrived to observe session. Maxi move utilized to transfer pt onto tilt table with second helper for safety. Pt adjusted into standing position for weight bearing through R LE and pt able to activate quad and hamstrings by extending and flexing R knee x 10 reps. Pt required max multimodal cues to attend to R and R UE with PROM in all planes and PNF movements as well. Pt assisted back to wheelchair and returned to room.   Therapy Documentation Precautions:  Precautions Precautions: Fall Restrictions Weight Bearing Restrictions: No General:   Vital Signs: Therapy Vitals Temp: (!) 97.3 F (36.3 C) Temp Source: Oral Pulse Rate: (!) 111 Resp: 18 BP: 111/89 Patient Position (if appropriate): Sitting Oxygen Therapy SpO2: 98 % O2 Device: Room Air Pain:   ADL: ADL ADL Comments: Please see functional navigator  See  Function Navigator for Current Functional Status.   Therapy/Group: Individual Therapy  [name], Citlally Captain P 09/10/2017, 3:26 PM

## 2017-09-10 NOTE — Progress Notes (Addendum)
Physical Therapy Session Note  Patient Details  Name: Carla Bauer MRN: [id number] Date of Birth: 08-Jul-1955  Today's Date: 09/10/2017 PT Individual Time: 0930-1000 PT Individual Time Calculation (min): 30 min  and Today's Date: 09/10/2017 PT Missed Time: 30 Minutes Missed Time Reason: Patient unwilling to participate  Short Term Goals: Week 1:  PT Short Term Goal 1 (Week 1): Pt will perform bed mobility with mod assist PT Short Term Goal 2 (Week 1): Pt will perform sit<>stand with mod assist PT Short Term Goal 3 (Week 1): Pt will initiate gait training PT Short Term Goal 4 (Week 1): Pt will perform bed<>chair transfer with max assist +1  Skilled Therapeutic Interventions/Progress Updates:   Pt received in TIS in care of NT, no evidence or c/o pain. Pt initially declining therapy stating "I'm not going with you", but agreeable to start session slow and leave room in TIS. Set-up at rail in hallway to attempt working on sit<>stands and progressing to gait. When encouraged to stand, pt stated "I will when I'm ready", "don't help me", and "don't touch me". 2nd therapist present providing encouragement as well. Both therapist educated pt on benefits of participating in rehab and pt's ability to regain some function. Pt stated she did want to get better and she did want to eventually go home, but remained distant and withdrawn towards therapists. This did seem to motivate her some and when given the opportunity to initiate on her own w/o manual assist, pt performed sit<>stand at rail w/ close supervision to min guard for safety and to decreased agitation. She required no manual assist to maintain static stance for 5-10 sec. When therapists continued to encourage pt by acknowledging the progress she has made, she continued to state "don't help me" and "don't touch me". Therapists continued w/ honest discussion regarding expectations of being on CIR including participating in rehab and trusting  therapists to assist and help her. Pt's daughter, "Boyd Kerbsenny", arrived at this time and also gave pt encouragement. Pt became increasingly agitated, directing profanity towards therapists, and stated "you can't tell me what to do", and becoming more withdrawn in her body language. Therapists asked daughter for any tips on ways to engage and motivate pt better, however daughter appeared frustrated by pt's refusal and stated to pt "go home then". Educated daughter on expectations of rehab as well and discussed w/ both pt and daughter that she can participate in therapy or family and medical team would need to make decision regarding another d/c option. Daughter verbalized understanding. Pt taken back to room in TIS w/ daughter to deescalate the situation, pt combative when therapist attempted to place quick release belt. Daughter agreeable to stay with pt. Ended session in TIS and in care of daughter, NT made aware of pt's status. Therapists recapped session w/ SW who plans to follow up with pt and daughter this date. Missed 30 min of skilled PT 2/2 refusal.   Therapy Documentation Precautions:  Precautions Precautions: Fall Restrictions Weight Bearing Restrictions: No General: PT Amount of Missed Time (min): 30 Minutes PT Missed Treatment Reason: Patient unwilling to participate Vital Signs: Therapy Vitals Pulse Rate: 100 BP: 118/64  See Function Navigator for Current Functional Status.   Therapy/Group: Individual Therapy  Jasdeep Kepner K Arnette 09/10/2017, 10:11 AM

## 2017-09-10 NOTE — Progress Notes (Signed)
Occupational Therapy Session Note  Patient Details  Name: Carla Bauer MRN: [id number] Date of Birth: May 31, 1955  Today's Date: 09/10/2017 OT Individual Time: [id number] OT Individual Time Calculation (min): 45 min    Short Term Goals: Week 1:  OT Short Term Goal 1 (Week 1): Pt will sit EOB with no more than mod A while participating in BADL task OT Short Term Goal 2 (Week 1): Pt will locate 1/4 grooming items at the sink with mod instructional cues OT Short Term Goal 3 (Week 1): Pt will complete 2/10 bathing steps to decrease caregiver burder. OT Short Term Goal 4 (Week 1): Pt will complete toilet transfer with max +2 assist  Skilled Therapeutic Interventions/Progress Updates:    Pt greeted sitting in wc working on breakfast with SLP. Handoff from SLP for OT session. Pt more pleasant initially this morning and agreeable to OT. Worked on attention, visual scanning, and sit<>stands within dressing tasks. Engaged pt in visual scanning to R to locate clothing with instructional cues. Gave pt extra time to initiate donning pants without cues with pt eventually trying to thread LLE, but needed assistance and mod A to maintain balance. Total A to thread clothing on R side of body. Sit<>stand in stedy 3x with mod A, the mod A for balance while +2 pulled pants over hips.  Had therapy tech ask pt to don deodorant with verbal cues to locate item at midline, then initiate placing under arms. Again had therapy tech ask pt to wash her face and locate wash cloth at midline with increased time and verbal cues. Pt then brought to dynavision room to continue working on attention, initiation, and visual scanning. Pt became combative, verbally and physically aggressive towards therapist. Pt swatting and grabbing at therapist, also using profanity at OT. Rehab tech able to don safety belt and pt tilted back in wc and brought to nurses station. Pt seemed to calm down after therapist out of sight.   Therapy  Documentation Precautions:  Precautions Precautions: Fall Restrictions Weight Bearing Restrictions: No ADL: ADL ADL Comments: Please see functional navigator  See Function Navigator for Current Functional Status.   Therapy/Group: Individual Therapy  [name] 09/10/2017, 8:54 AM

## 2017-09-10 NOTE — Progress Notes (Signed)
Physical Therapy Session Note  Patient Details  Name: Carla Bauer MRN: [id number] Date of Birth: 25-Jul-1955  Today's Date: 09/10/2017 PT Missed Time: 30 Minutes Missed Time Reason: Patient unwilling to participate;Increased agitation  Pt seated in TIS w/c upon PT arrival, pt immediately tells therapist to go away, "I'm not doing nothin' and you can't help me." Pt refusing therapy this session. Left at nurses station.    Therapy/Group: Individual Therapy  Cresenciano GenreEmily van Schagen, PT, DPT 09/10/2017, 12:04 PM

## 2017-09-11 ENCOUNTER — Inpatient Hospital Stay (HOSPITAL_COMMUNITY): Payer: Self-pay | Admitting: Occupational Therapy

## 2017-09-11 ENCOUNTER — Inpatient Hospital Stay (HOSPITAL_COMMUNITY): Payer: Self-pay

## 2017-09-11 ENCOUNTER — Inpatient Hospital Stay (HOSPITAL_COMMUNITY): Payer: Medicaid Other | Admitting: Speech Pathology

## 2017-09-11 ENCOUNTER — Inpatient Hospital Stay (HOSPITAL_COMMUNITY): Payer: Self-pay | Admitting: Physical Therapy

## 2017-09-11 LAB — BASIC METABOLIC PANEL
ANION GAP: 8 (ref 5–15)
BUN: 19 mg/dL (ref 6–20)
CALCIUM: 10.1 mg/dL (ref 8.9–10.3)
CO2: 32 mmol/L (ref 22–32)
CREATININE: 0.66 mg/dL (ref 0.44–1.00)
Chloride: 102 mmol/L (ref 101–111)
Glucose, Bld: 182 mg/dL — ABNORMAL HIGH (ref 65–99)
Potassium: 4.2 mmol/L (ref 3.5–5.1)
SODIUM: 142 mmol/L (ref 135–145)

## 2017-09-11 LAB — GLUCOSE, CAPILLARY
GLUCOSE-CAPILLARY: 142 mg/dL — AB (ref 65–99)
Glucose-Capillary: 170 mg/dL — ABNORMAL HIGH (ref 65–99)
Glucose-Capillary: 78 mg/dL (ref 65–99)
Glucose-Capillary: 106 mg/dL — ABNORMAL HIGH (ref 65–99)
Glucose-Capillary: 150 mg/dL — ABNORMAL HIGH (ref 65–99)
Glucose-Capillary: 166 mg/dL — ABNORMAL HIGH (ref 65–99)

## 2017-09-11 MED ORDER — BETHANECHOL CHLORIDE 10 MG PO TABS
10.0000 mg | ORAL_TABLET | Freq: Three times a day (TID) | ORAL | Status: DC
  Administered 2017-09-11 – 2017-09-18 (×19): 10 mg via ORAL
  Filled 2017-09-11 (×19): qty 1

## 2017-09-11 MED ORDER — TAMSULOSIN HCL 0.4 MG PO CAPS
0.4000 mg | ORAL_CAPSULE | Freq: Every day | ORAL | Status: DC
  Administered 2017-09-11 – 2017-09-26 (×15): 0.4 mg via ORAL
  Filled 2017-09-11 (×15): qty 1

## 2017-09-11 MED ORDER — OSMOLITE 1.5 CAL PO LIQD
1000.0000 mL | ORAL | Status: DC
  Administered 2017-09-11: 1000 mL
  Filled 2017-09-11 (×3): qty 1000

## 2017-09-11 NOTE — Progress Notes (Signed)
Subjective/Complaints:  More talkative but confused, we're in "[place]"  ROS: Limited due to cognitive/behavioral   Objective: Vital Signs: Blood pressure 115/74, pulse (!) 104, temperature 99 F (37.2 C), temperature source Axillary, resp. rate 18, height 5' 1" (1.549 m), weight 65 kg (143 lb 4.8 oz), SpO2 100 %. No results found. Results for orders placed or performed during the hospital encounter of 09/03/17 (from the past 72 hour(s))  Glucose, capillary     Status: Abnormal   Collection Time: 09/08/17  8:26 AM  Result Value Ref Range   Glucose-Capillary 109 (H) 65 - 99 mg/dL  Glucose, capillary     Status: Abnormal   Collection Time: 09/08/17 11:30 AM  Result Value Ref Range   Glucose-Capillary 125 (H) 65 - 99 mg/dL  Glucose, capillary     Status: None   Collection Time: 09/08/17  4:21 PM  Result Value Ref Range   Glucose-Capillary 76 65 - 99 mg/dL  Glucose, capillary     Status: Abnormal   Collection Time: 09/08/17  9:20 PM  Result Value Ref Range   Glucose-Capillary 155 (H) 65 - 99 mg/dL  Glucose, capillary     Status: Abnormal   Collection Time: 09/09/17 12:08 AM  Result Value Ref Range   Glucose-Capillary 162 (H) 65 - 99 mg/dL  Glucose, capillary     Status: Abnormal   Collection Time: 09/09/17  4:41 AM  Result Value Ref Range   Glucose-Capillary 144 (H) 65 - 99 mg/dL  Iron and TIBC     Status: None   Collection Time: 09/09/17  8:32 AM  Result Value Ref Range   Iron 53 28 - 170 ug/dL   TIBC 399 250 - 450 ug/dL   Saturation Ratios 13 10.4 - 31.8 %   UIBC 346 ug/dL    Comment: Performed at [facility], [address] [place], [place] [address]  Glucose, capillary     Status: Abnormal   Collection Time: 09/09/17  9:58 AM  Result Value Ref Range   Glucose-Capillary 156 (H) 65 - 99 mg/dL  Glucose, capillary     Status: None   Collection Time: 09/09/17  1:14 PM  Result Value Ref Range   Glucose-Capillary 82 65 - 99 mg/dL  Glucose, capillary      Status: None   Collection Time: 09/09/17  5:11 PM  Result Value Ref Range   Glucose-Capillary 81 65 - 99 mg/dL  Glucose, capillary     Status: Abnormal   Collection Time: 09/09/17  8:48 PM  Result Value Ref Range   Glucose-Capillary 165 (H) 65 - 99 mg/dL  Glucose, capillary     Status: Abnormal   Collection Time: 09/10/17 12:23 AM  Result Value Ref Range   Glucose-Capillary 155 (H) 65 - 99 mg/dL   Comment 1 Notify RN   Glucose, capillary     Status: Abnormal   Collection Time: 09/10/17  4:13 AM  Result Value Ref Range   Glucose-Capillary 132 (H) 65 - 99 mg/dL   Comment 1 Notify RN   Glucose, capillary     Status: Abnormal   Collection Time: 09/10/17  7:34 AM  Result Value Ref Range   Glucose-Capillary 182 (H) 65 - 99 mg/dL  Glucose, capillary     Status: Abnormal   Collection Time: 09/10/17 11:33 AM  Result Value Ref Range   Glucose-Capillary 104 (H) 65 - 99 mg/dL  Glucose, capillary     Status: None   Collection Time: 09/10/17  3:09  PM  Result Value Ref Range   Glucose-Capillary 84 65 - 99 mg/dL  Glucose, capillary     Status: Abnormal   Collection Time: 09/10/17  8:45 PM  Result Value Ref Range   Glucose-Capillary 151 (H) 65 - 99 mg/dL  Glucose, capillary     Status: Abnormal   Collection Time: 09/11/17  3:58 AM  Result Value Ref Range   Glucose-Capillary 142 (H) 65 - 99 mg/dL     Constitutional: No distress . Vital signs reviewed. HEENT: EOMI, oral membranes moist Neck: supple Cardiovascular: RRR without murmur. No JVD    Respiratory: CTA Bilaterally without wheezes or rales. Normal effort    GI: BS +, non-tender, non-distended  Extremity:  No Edema Skin:   Wound well healed incision Left crani, staples in Ventriculostomy placement site Neuro:confused, distracted.  Abnormal Sensory no response to pinch RUE, withdraws and winces to pinch R toes, Abnormal Motor 0/5 RUE and RLE, at least 4/5 on Left side, Abnormal FMC Ataxic/ dec FMC, Dysarthric and Aphasic Musc/Skel:   Other no pain with UE or LE ROM Gen NAD   Assessment/Plan: 1. 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 Physiatrist is providing close team supervision and 24 hour management of active medical problems listed below. Physiatrist and rehab team continue to assess barriers to discharge/monitor patient progress toward functional and medical goals. FIM: Function - Bathing Position: Bed Body parts bathed by patient: Chest, Abdomen Body parts bathed by helper: Right arm, Left arm, Front perineal area, Buttocks, Right upper leg, Left upper leg, Right lower leg, Left lower leg, Chest, Abdomen Assist Level: 2 helpers  Function- Upper Body Dressing/Undressing What is the patient wearing?: [place] over shirt/dress - Perfomed by helper: Thread/unthread right sleeve, Thread/unthread left sleeve, Put head through opening, Pull shirt over trunk Assist Level: 2 helpers Function - Lower Body Dressing/Undressing What is the patient wearing?: Non-skid slipper socks, Pants Position: Wheelchair/chair at sink Pants- Performed by helper: Thread/unthread right pants leg, Pull pants up/down, Thread/unthread left pants leg Non-skid slipper socks- Performed by helper: Don/doff left sock, Don/doff right sock Assist for footwear: Dependant Assist for lower body dressing: 2 Helpers  Function - Toileting Toileting activity did not occur: No continent bowel/bladder event Toileting steps completed by helper: Adjust clothing prior to toileting, Performs perineal hygiene, Adjust clothing after toileting Assist level: Two helpers  Function - Air cabin crew transfer activity did not occur: Safety/medical concerns Toilet transfer assistive device: Facilities manager lift: Stedy Assist level to toilet: 2 helpers Assist level from toilet: 2  helpers  Function - Chair/bed transfer Chair/bed transfer method: Stand pivot Chair/bed transfer assist level: dependent (Pt equals 0%) Chair/bed transfer assistive device: Mechanical lift Mechanical lift: Stedy Chair/bed transfer details: Manual facilitation for weight shifting, Manual facilitation for placement, Verbal cues for technique, Verbal cues for precautions/safety  Function - Locomotion: Wheelchair Will patient use wheelchair at discharge?: Yes Type: Manual Max wheelchair distance: 150 ft Assist Level: Dependent (Pt equals 0%) Assist Level: Dependent (Pt equals 0%) Assist Level: Dependent (Pt equals 0%) Turns around,maneuvers to table,bed, and toilet,negotiates 3% grade,maneuvers on rugs and over doorsills: No Function - Locomotion: Ambulation Ambulation activity did not occur: Safety/medical concerns  Function - Comprehension Comprehension: Auditory Comprehension assist level: Understands basic 25 - 49% of the time/ requires cueing 50 - 75% of the time  Function - Expression Expression: Verbal  Expression assist level: Expresses basis less than 25% of the time/requires cueing >75% of the time.  Function - Social Interaction Social Interaction assist level: Interacts appropriately less than 25% of the time. May be withdrawn or combative.  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 SLP- staples out 6/10, Team conference today please see physician documentation under team conference tab, met with team face-to-face to discuss problems,progress, and goals. Formulized individual treatment plan based on medical history, underlying problem and comorbidities. 2. DVT Prophylaxis/Anticoagulation: Subcutaneous heparin. LE vascular study negative 3. Pain Management:Tylenol as needed 4. Mood:Provide emotional support  -Add scheduled Seroquel at night-reduced dose secondary to sedation 12.5  -Add as needed Seroquel for agitation.  Want to avoid oversedation 5. Neuropsych: This patientis notcapable of making decisions on herown behalf. 6. Skin/Wound Care:Routine skin checks 7. Fluids/Electrolytes/Nutrition:Routine in and outs with follow-up chemistries -NGT feedings, adjust rate/H20 as needed- dietary consult  8.Dysphagia. Dysphagia #1 nectar liquids. Nasogastric tube feeds for nutritional support 9.Hyperglycemia induced from tube feeds. Continue Lantus insulin for coverage. CBG (last 3)  Recent Labs    09/10/17 1509 09/10/17 2045 09/11/17 0358  GLUCAP 84 151* 142*  Good range for hospital control 6/12 10.Hypertension. Lopressor 50 mg twice daily Vitals:   09/10/17 2030 09/11/17 0400  BP: 128/87 115/74  Pulse: (!) 102 (!) 104  Resp: 18 18  Temp: 98 F (36.7 C) 99 F (37.2 C)  SpO2: 92% 100%  BP control is good with mild tachycardia- exam recheck EKG- last one 5/15 showed NSR mild tachy  11.  Anemia,Hgb 9.2 Baseline Hgb ?11.8, had some post op blood loss, will check stool guaic, Normal Fe studies 6/10 12.  Agitation due to confusion , balancing control of agitation with sedation  13. Sinus tachy- likely multifactorial anemia, not hypoxic recheck BMET see if pt has signs of pre renal azotemia LOS (Days) 8 A FACE TO FACE EVALUATION WAS PERFORMED  Charlett Blake 09/11/2017, 7:19 AM

## 2017-09-11 NOTE — Plan of Care (Signed)
Pt's plan of care adjusted to 15 hours over 7 days after speaking with care team and discussed with MD in team conference as pt currently unable to tolerate current therapy schedule with OT, PT, and SLP.   Feliberto Gottronourtney Luzelena Heeg, [place], CCC-SLP [phone]

## 2017-09-11 NOTE — Progress Notes (Signed)
Speech Language Pathology Weekly Progress and Session Note  Patient Details  Name: Carla Bauer MRN: [id number] Date of Birth: [birth year]  Beginning of progress report period: September 04, 2017 End of progress report period: September 11, 2017  Today's Date: 09/11/2017 SLP Individual Time: 1030-1055 SLP Individual Time Calculation (min): 25 min  Short Term Goals: Week 1: SLP Short Term Goal 1 (Week 1): Pt will focus her attention to a targeted stimulus in 75% of opportunities with max assist multimodal cues.   SLP Short Term Goal 1 - Progress (Week 1): Not met SLP Short Term Goal 2 (Week 1): Pt will follow 1 step commands during basic, structured tasks for 75% accuracy with max assist multimodal cues.  SLP Short Term Goal 2 - Progress (Week 1): Not met SLP Short Term Goal 3 (Week 1): Pt will locate items at midline in 50% of opportunities during basic, structured tasks with max assist multimodal cues.  SLP Short Term Goal 3 - Progress (Week 1): Not met SLP Short Term Goal 4 (Week 1): Pt will consume dys 1 textures and nectar thick liquids with max assist multimodal cues for use of swallowing precautions and no overt s/s of aspiration.  SLP Short Term Goal 4 - Progress (Week 1): Not met SLP Short Term Goal 5 (Week 1): Pt will verbally respond to basic, personally, relevant questions in 25% of opportunities with max assist multimodal cues.  SLP Short Term Goal 5 - Progress (Week 1): Not met    New Short Term Goals: Week 2: SLP Short Term Goal 1 (Week 2): Pt will verbally respond to basic, personally, relevant questions in 25% of opportunities with max assist multimodal cues.  SLP Short Term Goal 2 (Week 2): Pt will follow 1 step commands during basic, structured tasks for 75% accuracy with max assist multimodal cues.  SLP Short Term Goal 3 (Week 2): Pt will focus her attention to a targeted stimulus in 75% of opportunities with max assist multimodal cues.   SLP Short Term Goal 4 (Week 2): Pt will  locate items at midline in 50% of opportunities during basic, structured tasks with max assist multimodal cues.  SLP Short Term Goal 5 (Week 2): Pt will consume dys 1 textures and nectar thick liquids with max assist multimodal cues for use of swallowing precautions and no overt s/s of aspiration.   Weekly Progress Updates: Patient has not made any gains and has met 0 STG's this reporting period. [name] progress has been limited due to minimal participation in sessions with intermittent verbal agitation and combativeness. Currently, patient is consuming minimal amounts of Dys. 1 textures with nectar-thick liquids without overt s/s of aspiration but requires total A cues for use of small bites/sips. Due to patient's cognitive and swallowing deficits, patient may benefit from PEG placement for supplemental nutrition. Patient continues to require overall Max-Total A for initiation, sustained attention and problem solving with functional tasks. Patient demonstrates increased overall verbal expression but utilizes her verbal output to insult staff members or refuse treatment efforts rather than express basic wants/needs. Patient demonstrates intermittent language of confusion with jargon which is further exacerbated by decreased speech intelligibility. Family education is ongoing. Patient would benefit from continued skilled SLP intervention to maximize her cognitive-linguistic and swallowing function prior to discharge. [name] current plan of care has been changed to 15/7.      Intensity: Minumum of 1-2 x/day, 30 to 90 minutes Frequency: 3 to 5 out of 7 days Duration/Length of Stay: TBD due  to possible SNF placement  Treatment/Interventions: Cognitive remediation/compensation;Cueing hierarchy;Dysphagia/aspiration precaution training;Functional tasks;Environmental controls;Internal/external aids;Patient/family education;Speech/Language facilitation;Multimodal communication approach   Daily  Session  Skilled Therapeutic Interventions: Skilled treatment session focused on cognitive and dysphagia goals. SLP facilitated session by providing Max-Total A verbal and tactile cues for problem solving for bed mobility and transfer to the wheelchair to maximize arousal and participation. Patient consumed breakfast meal of Dys. 1 textures with nectar-thick liquids without overt s/s of aspiration but required Total A verbal cues for use of small bites/sips and to self-monitor and correct right anterior spillage. Patient initially required Max A verbal cues to initiate self-feeding which faded to Min A verbal cues by end of task.  Patient required total A for orientation and demonstrated intermittent jargon/language of confusion throughout session. Verbal expression also impacted by decreased intelligibility. Patient left upright in wheelchair with belt and alarm on and telesitter. Continue with current plan of care. Of note, this clinician utilized a familiar staff member that was of the same ethnicity in order to maximize patient's participation.   Function:   Eating Eating   Modified Consistency Diet: Yes Eating Assist Level: Help managing cup/glass;Supervision or verbal cues;Set up assist for;Helper scoops food on utensil   Eating Set Up Assist For: Opening containers       Cognition Comprehension Comprehension assist level: Understands basic 25 - 49% of the time/ requires cueing 50 - 75% of the time  Expression   Expression assist level: Expresses basis less than 25% of the time/requires cueing >75% of the time.  Social Interaction Social Interaction assist level: Interacts appropriately less than 25% of the time. May be withdrawn or combative.  Problem Solving Problem solving assist level: Solves basic less than 25% of the time - needs direction nearly all the time or does not effectively solve problems and may need a restraint for safety  Memory Memory assist level: Recognizes or recalls  less than 25% of the time/requires cueing greater than 75% of the time   Pain Pain Assessment Pain Score: 0-No pain Faces Pain Scale: No hurt  Therapy/Group: Individual Therapy  Carla Bauer 09/11/2017, 12:38 PM

## 2017-09-11 NOTE — Progress Notes (Signed)
Physical Therapy Session Note  Patient Details  Name: Carla Bauer MRN: [id number] Date of Birth: [birth year]  Today's Date: 09/11/2017 PT Individual Time: 3524-8185 PT Individual Time Calculation (min): 41 min   Short Term Goals: Week 2:  PT Short Term Goal 1 (Week 2): Pt will perform bed<>w/c transfer with max assist +1 PT Short Term Goal 2 (Week 2): Pt will ambulate x 30 ft with max assist +1 and LRAD PT Short Term Goal 3 (Week 2): Pt will initiate w/c propulsion   Skilled Therapeutic Interventions/Progress Updates:    no c/o pain, initially resistant to leaving room, but able to be redirected.  Session focus on attention, recall, meaningful conversation, and positioning in w/c.    Pt initially sitting windswept in w/c with BLEs off foot rests and positioned to L of leg rests, pelvis to L of seat, and trunk to far right of backrest.  Initially unable to correct with physical assist due to increased agitation, but later in session pt able to shift weight forward using arm rest and therapist able to provide mod assist for pt to scoot back.  Pt able to maintain conversation with therapist, mod assist to recall events from earlier therapy sessions, and mod cues to attend to task.  Pt declined any sort of cognitive game (cards, puzzles, etc) throughout entirety of session.  Returned to nursing station in w/c at end of session, QRB remains in place, call bell in reach and needs met.    Therapy Documentation Precautions:  Precautions Precautions: Fall Restrictions Weight Bearing Restrictions: No   See Function Navigator for Current Functional Status.   Therapy/Group: Individual Therapy  Michel Santee 09/11/2017, 3:57 PM

## 2017-09-11 NOTE — Progress Notes (Signed)
Occupational Therapy Weekly Progress Note  Patient Details  Name: Carla Bauer MRN: [id number] Date of Birth: January 11, 1956  Beginning of progress report period: September 04, 2017 End of progress report period: September 11, 2017  Today's Date: 09/11/2017 OT Individual Time: 5993-5701 OT Individual Time Calculation (min): 25 min  and Today's Date: 09/11/2017 OT Missed Time: 35 Minutes Missed Time Reason: Nursing care;Patient unwilling/refused to participate without medical reason  Patient has met 1 of 4 short term goals. Pt has not made much progress towards OT goals this week 2/2 pt unwilling to participate. Pt refuses to follow commands from therapist, is very easily agitated, then becomes combative if therapist continues to try to work with pt. She will follow commands occasionally from therapy techs who provide +2 during therapy sessions, but she is still not consistently participating. Pt has the potential to progress in therapy, but is very self-limiting 2/2 behaviors.   Patient continues to demonstrate the following deficits: muscle weakness, impaired timing and sequencing, abnormal tone, unbalanced muscle activation, motor apraxia, ataxia, decreased coordination and decreased motor planning, decreased midline orientation, decreased attention to right, right side neglect, decreased motor planning and ideational apraxia, decreased initiation, decreased attention, decreased awareness, decreased problem solving, decreased safety awareness, decreased memory and delayed processing and decreased sitting balance, decreased standing balance, decreased postural control, hemiplegia and decreased balance strategies and therefore will continue to benefit from skilled OT intervention to enhance overall performance with BADL and Reduce care partner burden.  Patient not progressing toward long term goals.  May need to downgrade all goals after discussing dc plan with care team.  OT Short Term Goals Week 1:  OT  Short Term Goal 1 (Week 1): Pt will sit EOB with no more than mod A while participating in BADL task OT Short Term Goal 1 - Progress (Week 1): Not met OT Short Term Goal 2 (Week 1): Pt will locate 1/4 grooming items at the sink with mod instructional cues OT Short Term Goal 2 - Progress (Week 1): Not met OT Short Term Goal 3 (Week 1): Pt will complete 2/10 bathing steps to decrease caregiver burder. OT Short Term Goal 3 - Progress (Week 1): Met OT Short Term Goal 4 (Week 1): Pt will complete toilet transfer with max +2 assist OT Short Term Goal 4 - Progress (Week 1): Not met Week 2:  OT Short Term Goal 1 (Week 2): Pt will sit EOB with no more than mod A while participating in BADL task OT Short Term Goal 2 (Week 2): Pt will locate 1/4 grooming items at the sink with mod instructional cues OT Short Term Goal 3 (Week 2): Pt will complete toilet transfer with max +2 assist  Skilled Therapeutic Interventions/Progress Updates:    Pt greeted semi-reclined in bed asleep. OT woke pt gently and told her it was time for therapy. Pt became agitated and stated "I'm not doing no therapy." Got out pt's clothes and told her we were going to get dressed and pt stated " I'm not doing anything until I want to do it and I ain't doing nothing." Nursing entered to provide pt meds. OT returned after 30 minutes with rehab tech to try to engage pt in therapy. Nursing still working on medications through NG tube. Returned for a third time and had therapy tech provide cues to pt and she would follow them 50% of the time. Pt able to wash peri-area with set-up A, then followed commands for brief change with min A  to roll to the R and max A to roll L. Pants donned in bed with total A, but pt did follow command to lift LLE into pant leg. Pt brought into long sitting to don shirt with total A. Pt left semi-reclined in bed with bed alarm on.    Therapy Documentation Precautions:  Precautions Precautions:  Fall Restrictions Weight Bearing Restrictions: No Pain: Pain Assessment Pain Score: Asleep ADL: ADL ADL Comments: Please see functional navigator  See Function Navigator for Current Functional Status.  Therapy/Group: Individual Therapy  Valma Cava 09/11/2017, 8:15 AM

## 2017-09-11 NOTE — Progress Notes (Signed)
Pt do not comply with care today resulted in her aggressive behavior

## 2017-09-11 NOTE — Progress Notes (Signed)
Physical Therapy Weekly Progress Note  Patient Details  Name: Carla Bauer MRN: [id number] Date of Birth: Aug 06, 1955  Beginning of progress report period: September 04, 2017 End of progress report period: September 11, 2017  Today's Date: 09/11/2017 PT Individual Time: 1300-1400 PT Individual Time Calculation (min): 60 min   Patient has met 2 of 4 short term goals. Pt has not made much progress towards PT goals this week 2/2 pt unwilling to participate. Pt refuses to follow commands from therapist, is very easily agitated, then becomes combative if therapist continues to try to work with pt. She will follow commands occasionally from therapy techs who provide +2 during therapy sessions, but she is still not consistently participating. Pt has the potential to progress in therapy, but is very self-limiting 2/2 behaviors. Pt has been reduced to 15/7 for therapies secondary to poor participation.    Patient continues to demonstrate the following deficits muscle weakness, decreased coordination and decreased motor planning, decreased midline orientation, decreased attention to right and decreased motor planning, decreased attention, decreased awareness and decreased safety awareness and decreased standing balance, decreased postural control, hemiplegia and decreased balance strategies and therefore will continue to benefit from skilled PT intervention to increase functional independence with mobility.  Patient progressing toward long term goals..  In today's therapy sessino pt has demonstrated progress towards goals, however pt has been reduced to 15/7 secondary to inconsistent and poor participation overall.  PT Short Term Goals Week 1:  PT Short Term Goal 1 (Week 1): Pt will perform bed mobility with mod assist PT Short Term Goal 1 - Progress (Week 1): Not met PT Short Term Goal 2 (Week 1): Pt will perform sit<>stand with mod assist PT Short Term Goal 2 - Progress (Week 1): Met PT Short Term Goal 3  (Week 1): Pt will initiate gait training PT Short Term Goal 3 - Progress (Week 1): Met PT Short Term Goal 4 (Week 1): Pt will perform bed<>chair transfer with max assist +1 PT Short Term Goal 4 - Progress (Week 1): Not met Week 2:  PT Short Term Goal 1 (Week 2): Pt will perform bed<>w/c transfer with max assist +1 PT Short Term Goal 2 (Week 2): Pt will ambulate x 30 ft with max assist +1 and LRAD PT Short Term Goal 3 (Week 2): Pt will initiate w/c propulsion   Skilled Therapeutic Interventions/Progress Updates:    Pt seated in TIS w/c upon PT arrival. Pt does not respond to therapists commands or statement and will only follow directions given by therapy tech Dannielle Huh), therefore therapist directing care throughout session vis therapy tech. Pt transported to the dayroom in w/c. Pt used standing frame x 10 minutes working on standing activity tolerance, LE weightbearing and LE ROM, while standing pt engaging in conversation with therapy tech and responding to questions, pt disoriented to place and time. Pt incontinent of bladder. Pt transported back to room. Pt performed sit<>stands within stedy x 2 with mod assist, while therapist and tech performed clothing management, changed briefs and cleaned peri area total assist. Pt transported to the gym. Pt ambulated 2 x 30 ft with max assist using L handrail, therapist advancing R LE during swing and blocking R knee during stance, +2 (therapy tech) used to provide encouragement to the pt and provide verbal cues for sequencing. Pt noticed therapist helping during this activity and stated "I don't need any help to walk, let go of me." Therapy tech able to convince pt to allow therapists help.  Pt transported back to room at end of session, worked on attention in order to eat some lunch with therapy tech feeding pt. Pt would not grab utensils or food on her own and would not track past midline to the R. Pt left reclined in TIS with QRB in place and chair alarm set.    Therapy Documentation Precautions:  Precautions Precautions: Fall Restrictions Weight Bearing Restrictions: No   See Function Navigator for Current Functional Status.  Therapy/Group: Individual Therapy  Netta Corrigan, PT, DPT 09/11/2017, 7:51 AM

## 2017-09-11 NOTE — Plan of Care (Addendum)
  Problem: RH COGNITION-NURSING Goal: RH STG USES MEMORY AIDS/STRATEGIES W/ASSIST TO PROBLEM SOLVE Description STG Uses Memory Aids/Strategies With mod  Assistance to Problem Solve. Cues and supervision   Outcome: Not Progressing  Continue to educate, pt continues to have aggressive behavior

## 2017-09-11 NOTE — Plan of Care (Signed)
  Problem: RH Balance Goal: LTG Patient will maintain dynamic standing with ADLs (OT) Description LTG:  Patient will maintain dynamic standing balance with assist during activities of daily living (OT)  Outcome: Not Applicable Flowsheets (Taken 09/11/2017 1322) LTG: PT WILL MAINTAIN DYNAMIC STANDING BALANCE DURING ADLS WITH: -- (dc goal 6/12) Note:  D/c goal 2/2 lack of progress and pt participation-ESD 6/12   Problem: RH Eating Goal: LTG Patient will perform eating w/assist, cues/equip (OT) Description LTG: Patient will perform eating with assist, with/without cues using equipment (OT) Outcome: Not Applicable Flowsheets (Taken 09/11/2017 1322) LTG: Pt will perform eating with assistance level of: -- (dc goal 6/12) Note:  D/c goal 2/2 lack of progress and pt participation-ESD 6/12   Problem: RH Grooming Goal: LTG Patient will perform grooming w/assist,cues/equip (OT) Description LTG: Patient will perform grooming with assist, with/without cues using equipment (OT) Outcome: Not Applicable Flowsheets (Taken 09/11/2017 1322) LTG: Pt will perform grooming with assistance level of: -- (dc goal 6/12 ESD) Note:  Dc goal 2/2 lack of progress and pt participation-ESD 6/12   Problem: RH Toileting Goal: LTG Patient will perform toileting task (3/3 steps) with assistance level (OT) Description LTG: Patient will perform toileting task (3/3 steps) with assistance level (OT)  Outcome: Not Applicable Flowsheets (Taken 09/11/2017 1322) LTG: Pt will perform toileting task (3/3 steps) with assistance level : -- (dc goal 6/12 ESD) Note:  Dc goal 2/2 lack of progress and pt participation-ESD 6/12   Problem: RH Functional Use of Upper Extremity Goal: LTG Patient will use RT/LT upper extremity as a (OT) Description LTG: Patient will use right/left upper extremity as a stabilizer/gross assist/diminished/nondominant/dominant level with assist, with/without cues during functional activity (OT) Outcome:  Not Applicable Flowsheets (Taken 09/11/2017 1322) LTG: Use of upper extremity in functional activities: -- (dc goal 6/12 ESD) Note:  Dc goal 2/2 lack of progress and pt participation-ESD 6/12   Problem: RH Tub/Shower Transfers Goal: LTG Patient will perform tub/shower transfers w/assist (OT) Description LTG: Patient will perform tub/shower transfers with assist, with/without cues using equipment (OT) Outcome: Not Applicable Flowsheets (Taken 09/11/2017 1322) LTG: Pt will perform tub/shower stall transfers with assistance level of: -- (dc goal 6/12 esd) Note:  Dc goal 2/2 lack of progress and pt participation-ESD 6/12

## 2017-09-12 ENCOUNTER — Inpatient Hospital Stay (HOSPITAL_COMMUNITY): Payer: Medicaid Other

## 2017-09-12 ENCOUNTER — Inpatient Hospital Stay (HOSPITAL_COMMUNITY): Payer: Self-pay | Admitting: Physical Therapy

## 2017-09-12 ENCOUNTER — Inpatient Hospital Stay (HOSPITAL_COMMUNITY): Payer: Self-pay | Admitting: Occupational Therapy

## 2017-09-12 ENCOUNTER — Inpatient Hospital Stay (HOSPITAL_COMMUNITY): Payer: Self-pay | Admitting: Speech Pathology

## 2017-09-12 LAB — GLUCOSE, CAPILLARY
GLUCOSE-CAPILLARY: 165 mg/dL — AB (ref 65–99)
GLUCOSE-CAPILLARY: 81 mg/dL (ref 65–99)
Glucose-Capillary: 107 mg/dL — ABNORMAL HIGH (ref 65–99)
Glucose-Capillary: 113 mg/dL — ABNORMAL HIGH (ref 65–99)
Glucose-Capillary: 133 mg/dL — ABNORMAL HIGH (ref 65–99)
Glucose-Capillary: 183 mg/dL — ABNORMAL HIGH (ref 65–99)

## 2017-09-12 MED ORDER — PRO-STAT SUGAR FREE PO LIQD
30.0000 mL | Freq: Three times a day (TID) | ORAL | Status: DC
  Administered 2017-09-12 – 2017-09-17 (×13): 30 mL
  Filled 2017-09-12 (×12): qty 30

## 2017-09-12 MED ORDER — OSMOLITE 1.5 CAL PO LIQD
1000.0000 mL | ORAL | Status: DC
  Administered 2017-09-12 – 2017-09-15 (×3): 1000 mL
  Filled 2017-09-12 (×12): qty 1000

## 2017-09-12 MED ORDER — METOPROLOL TARTRATE 25 MG/10 ML ORAL SUSPENSION
100.0000 mg | Freq: Two times a day (BID) | ORAL | Status: DC
  Administered 2017-09-12 – 2017-09-27 (×29): 100 mg
  Filled 2017-09-12 (×31): qty 40

## 2017-09-12 NOTE — Plan of Care (Signed)
  Problem: RH COGNITION-NURSING Goal: RH STG USES MEMORY AIDS/STRATEGIES W/ASSIST TO PROBLEM SOLVE Description STG Uses Memory Aids/Strategies With mod  Assistance to Problem Solve. Cues and supervision   Outcome: Progressing  Staff continuing to educate pt., slow progress

## 2017-09-12 NOTE — Progress Notes (Addendum)
Spoke with daughter Birdena JubileeLatoya Dobbins # [phone] (her main caregiver) about need for PEG tube due to very poor po intake. She is agreeable to have it placed for temporary measures. Informed her that IR would contact her with details.

## 2017-09-12 NOTE — Progress Notes (Signed)
Occupational Therapy Session Note  Patient Details  Name: Carla Bauer MRN: [id number] Date of Birth: Oct 14, 1955  Today's Date: 09/12/2017 OT Individual Time: 0802-0900 OT Individual Time Calculation (min): 58 min    Short Term Goals: Week 2:  OT Short Term Goal 1 (Week 2): Pt will sit EOB with no more than mod A while participating in BADL task OT Short Term Goal 2 (Week 2): Pt will locate 1/4 grooming items at the sink with mod instructional cues OT Short Term Goal 3 (Week 2): Pt will complete toilet transfer with max +2 assist  Skilled Therapeutic Interventions/Progress Updates:    Pt greeted semi-reclined in bed, in a more pleasant mood this AM. OT directed care through rehab tech as pt responds well to tech and will not participate for OT. Pt incontinent of urine, worked on rolling L and R for brief change. Pt able to wash peri-area with verbal cues and increased time to initiate. Pt came to sitting EOB with Max A to advance R side. Pt needed assistance to thread R LE, then verbal and tactile cues to initiate threading LLE. Sit<>stand in [place] with mod A and facilitation for full hip extension. Pt assisted with pulling pants up in front without cues. Stedy used to transfer pt to [employer]. Worked on squencing and initiation for UB dressing with assistance to thread R LE and unable to get pt to look past midline for R body awareness. Tried to get pt to look to midline to locate wash cloth to wash her face, but pt unable to with max multimodal cues. Sit<>stand at the sink with mod A with facilitation at hip and R knee block. Facilitation for full upright posture. Pt stoood for ~30 seconds then became agitated with therapist assisting pt. Pt returned to TIS wc and set-up with 1/2 lap tray, safety belt, and needs met.   Therapy Documentation Precautions:  Precautions Precautions: Fall Restrictions Weight Bearing Restrictions: (P) No Pain:  none/denies pain ADL: ADL ADL Comments: Please  see functional navigator  See Function Navigator for Current Functional Status.   Therapy/Group: Individual Therapy  Valma Cava 09/12/2017, 9:01 AM

## 2017-09-12 NOTE — Progress Notes (Signed)
Subjective/Complaints: DIscussed poor appetite, pt not oriented ROS: Limited due to cognitive/behavioral   Objective: Vital Signs: Blood pressure (!) 141/86, pulse (!) 118, temperature 98 F (36.7 C), temperature source Oral, resp. rate 16, height 5' 1" (1.549 m), weight 65.2 kg (143 lb 11.8 oz), SpO2 100 %. No results found. Results for orders placed or performed during the hospital encounter of 09/03/17 (from the past 72 hour(s))  Iron and TIBC     Status: None   Collection Time: 09/09/17  8:32 AM  Result Value Ref Range   Iron 53 28 - 170 ug/dL   TIBC 399 250 - 450 ug/dL   Saturation Ratios 13 10.4 - 31.8 %   UIBC 346 ug/dL    Comment: Performed at [facility] [address], [place], [place] [address]  Glucose, capillary     Status: Abnormal   Collection Time: 09/09/17  9:58 AM  Result Value Ref Range   Glucose-Capillary 156 (H) 65 - 99 mg/dL  Glucose, capillary     Status: None   Collection Time: 09/09/17  1:14 PM  Result Value Ref Range   Glucose-Capillary 82 65 - 99 mg/dL  Glucose, capillary     Status: None   Collection Time: 09/09/17  5:11 PM  Result Value Ref Range   Glucose-Capillary 81 65 - 99 mg/dL  Glucose, capillary     Status: Abnormal   Collection Time: 09/09/17  8:48 PM  Result Value Ref Range   Glucose-Capillary 165 (H) 65 - 99 mg/dL  Glucose, capillary     Status: Abnormal   Collection Time: 09/10/17 12:23 AM  Result Value Ref Range   Glucose-Capillary 155 (H) 65 - 99 mg/dL   Comment 1 Notify RN   Glucose, capillary     Status: Abnormal   Collection Time: 09/10/17  4:13 AM  Result Value Ref Range   Glucose-Capillary 132 (H) 65 - 99 mg/dL   Comment 1 Notify RN   Glucose, capillary     Status: Abnormal   Collection Time: 09/10/17  7:34 AM  Result Value Ref Range   Glucose-Capillary 182 (H) 65 - 99 mg/dL  Glucose, capillary     Status: Abnormal   Collection Time: 09/10/17 11:33 AM  Result Value Ref Range   Glucose-Capillary 104 (H) 65 - 99  mg/dL  Glucose, capillary     Status: None   Collection Time: 09/10/17  3:09 PM  Result Value Ref Range   Glucose-Capillary 84 65 - 99 mg/dL  Glucose, capillary     Status: Abnormal   Collection Time: 09/10/17  8:45 PM  Result Value Ref Range   Glucose-Capillary 151 (H) 65 - 99 mg/dL  Glucose, capillary     Status: Abnormal   Collection Time: 09/10/17 11:54 PM  Result Value Ref Range   Glucose-Capillary 170 (H) 65 - 99 mg/dL   Comment 1 QC Due   Glucose, capillary     Status: Abnormal   Collection Time: 09/11/17  3:58 AM  Result Value Ref Range   Glucose-Capillary 142 (H) 65 - 99 mg/dL  Glucose, capillary     Status: None   Collection Time: 09/11/17  8:12 AM  Result Value Ref Range   Glucose-Capillary 78 65 - 99 mg/dL  Basic metabolic panel     Status: Abnormal   Collection Time: 09/11/17 11:16 AM  Result Value Ref Range   Sodium 142 135 - 145 mmol/L   Potassium 4.2 3.5 - 5.1 mmol/L   Chloride 102  101 - 111 mmol/L   CO2 32 22 - 32 mmol/L   Glucose, Bld 182 (H) 65 - 99 mg/dL   BUN 19 6 - 20 mg/dL   Creatinine, Ser 0.66 0.44 - 1.00 mg/dL   Calcium 10.1 8.9 - 10.3 mg/dL   GFR calc non Af Amer >60 >60 mL/min   GFR calc Af Amer >60 >60 mL/min    Comment: (NOTE) The eGFR has been calculated using the CKD EPI equation. This calculation has not been validated in all clinical situations. eGFR's persistently <60 mL/min signify possible Chronic Kidney Disease.    Anion gap 8 5 - 15    Comment: Performed at [facility] [address], [place], [place] [address]  Glucose, capillary     Status: Abnormal   Collection Time: 09/11/17 12:04 PM  Result Value Ref Range   Glucose-Capillary 150 (H) 65 - 99 mg/dL  Glucose, capillary     Status: Abnormal   Collection Time: 09/11/17  4:21 PM  Result Value Ref Range   Glucose-Capillary 106 (H) 65 - 99 mg/dL  Glucose, capillary     Status: Abnormal   Collection Time: 09/11/17  7:53 PM  Result Value Ref Range   Glucose-Capillary 166  (H) 65 - 99 mg/dL  Glucose, capillary     Status: Abnormal   Collection Time: 09/11/17 11:51 PM  Result Value Ref Range   Glucose-Capillary 183 (H) 65 - 99 mg/dL  Glucose, capillary     Status: Abnormal   Collection Time: 09/12/17  3:54 AM  Result Value Ref Range   Glucose-Capillary 165 (H) 65 - 99 mg/dL     Constitutional: No distress . Vital signs reviewed. HEENT: EOMI, oral membranes moist Neck: supple Cardiovascular: RRR without murmur. No JVD    Respiratory: CTA Bilaterally without wheezes or rales. Normal effort    GI: BS +, non-tender, non-distended  Extremity:  No Edema Skin:   Wound well healed incision Left crani, staples in Ventriculostomy placement site Neuro:confused, distracted.  Abnormal Sensory no response to pinch RUE, withdraws and winces to pinch R toes, Abnormal Motor 0/5 RUE and RLE, at least 4/5 on Left side, Abnormal FMC Ataxic/ dec FMC, Dysarthric and Aphasic Musc/Skel:  Other no pain with UE or LE ROM Gen NAD   Assessment/Plan: 1. 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 Physiatrist is providing close team supervision and 24 hour management of active medical problems listed below. Physiatrist and rehab team continue to assess barriers to discharge/monitor patient progress toward functional and medical goals. FIM: Function - Bathing Position: Bed Body parts bathed by patient: Chest, Abdomen Body parts bathed by helper: Right arm, Left arm, Front perineal area, Buttocks, Right upper leg, Left upper leg, Right lower leg, Left lower leg, Chest, Abdomen Assist Level: 2 helpers  Function- Upper Body Dressing/Undressing What is the patient wearing?: Pull over shirt/dress Pull over shirt/dress - Perfomed by helper: Thread/unthread right sleeve, Thread/unthread left sleeve, Put head through opening, Pull shirt  over trunk Assist Level: 2 helpers Function - Lower Body Dressing/Undressing What is the patient wearing?: Pants Position: Bed Pants- Performed by helper: Thread/unthread left pants leg, Thread/unthread right pants leg, Pull pants up/down Non-skid slipper socks- Performed by helper: Don/doff left sock, Don/doff right sock Assist for footwear: Dependant Assist for lower body dressing: 2 Helpers  Function - Toileting Toileting activity did not occur:  No continent bowel/bladder event Toileting steps completed by helper: Adjust clothing prior to toileting, Performs perineal hygiene, Adjust clothing after toileting Assist level: Two helpers  Function - Air cabin crew transfer activity did not occur: Safety/medical concerns Toilet transfer assistive device: Facilities manager lift: Stedy Assist level to toilet: 2 helpers Assist level from toilet: 2 helpers  Function - Chair/bed transfer Chair/bed transfer [place]: Stand pivot Chair/bed transfer assist level: dependent (Pt equals 0%) Chair/bed transfer assistive device: Mechanical lift Mechanical lift: Stedy Chair/bed transfer details: Manual facilitation for weight shifting, Manual facilitation for placement, Verbal cues for technique, Verbal cues for precautions/safety  Function - Locomotion: Wheelchair Will patient use wheelchair at discharge?: Yes Type: Manual Max wheelchair distance: 150 ft Assist Level: Dependent (Pt equals 0%) Assist Level: Dependent (Pt equals 0%) Assist Level: Dependent (Pt equals 0%) Turns around,maneuvers to table,bed, and toilet,negotiates 3% grade,maneuvers on rugs and over doorsills: No Function - Locomotion: Ambulation Ambulation activity did not occur: Safety/medical concerns Assistive device: Rail in hallway Max distance: 30 ft Assist level: 2 helpers Assist level: 2 helpers  Function - Comprehension Comprehension: Auditory Comprehension assist level: Understands basic 25 - 49%  of the time/ requires cueing 50 - 75% of the time  Function - Expression Expression: Verbal Expression assist level: Expresses basis less than 25% of the time/requires cueing >75% of the time.  Function - Social Interaction Social Interaction assist level: Interacts appropriately less than 25% of the time. May be withdrawn or combative.  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 SLP-  2. DVT Prophylaxis/Anticoagulation: Subcutaneous heparin. LE vascular study negative 3. Pain Management:Tylenol as needed 4. Mood:Provide emotional support  -Add scheduled Seroquel at night-reduced dose secondary to sedation 12.5  -Add as needed Seroquel for agitation.  Want to avoid oversedation 5. Neuropsych: This patientis notcapable of making decisions on herown behalf. 6. Skin/Wound Care:Routine skin checks 7. Fluids/Electrolytes/Nutrition:Routine in and outs with follow-up chemistries -NGT feedings, adjust rate/H20 as needed- dietary consult Meal intake has been 0% 8.Dysphagia. Dysphagia #1 nectar liquids. Nasogastric tube feeds for nutritional support, will need  PEG  Given  9.Hyperglycemia induced from tube feeds. Continue Lantus insulin for coverage. CBG (last 3)  Recent Labs    09/11/17 1953  09/11/17 2351 09/12/17 0354  GLUCAP 166* 183* 165*  Good range for hospital control 6/13 10.Hypertension. Lopressor 50 mg twice daily Vitals:   09/11/17 2004 09/12/17 0455  BP: (!) 144/96 (!) 141/86  Pulse: (!) 132 (!) 118  Resp: 18 16  Temp: 98 F (36.7 C) 98 F (36.7 C)  SpO2: 100% 100%  BP is creeping up with mild sinus tachycardia- increase lopressor 11.  Anemia,Hgb 9.2 Baseline Hgb ?11.8, had some post op blood loss, will check stool guaic, Normal Fe studies 6/10 12.  Agitation due to confusion , balancing control of agitation with sedation  13. Sinus tachy- likely multifactorial anemia, not  hypoxic ,6/12 normal BMET increase BB LOS (Days) 9 A FACE TO FACE EVALUATION WAS PERFORMED  Charlett Blake 09/12/2017, 7:35 AM

## 2017-09-12 NOTE — Progress Notes (Signed)
Nutrition Follow-up  DOCUMENTATION CODES:   Not applicable  INTERVENTION:   Nocturnal tube feeds using Osmolite 1.5 formula via Cortrak NGT at new goal rate of 80 ml/hr x 12 hours (6pm-6am).  Provide 30 ml Prostat per tube TID.    Continue free water flushes of 400 ml q 6 hours. MD to adjust free water as appropriate.  Tube feeding regimen provides 1740 kcal (100% of needs), 105 grams of protein, 2330 ml water.   ProvideMagic cup TID with meals, each supplement provides 290 kcal and 9 grams of protein.  NUTRITION DIAGNOSIS:   Inadequate oral intake related to dysphagia as evidenced by meal completion < 25%; ongoing  GOAL:   Patient will meet greater than or equal to 90% of their needs; met via TF  MONITOR:   PO intake, Supplement acceptance, Diet advancement, Weight trends, Labs, TF tolerance, Skin, I & O's  REASON FOR ASSESSMENT:   Consult Enteral/tube feeding initiation and management  ASSESSMENT:   62 year old female with history of headaches. Presented 08/14/2017 after being found down by her family with right sided weakness. Cranial CT scan showed left temporal hemorrhage. Underwent left craniotomy for clipping of aneurysm, evacuation of left temporal hematoma placement of right frontal external ventricular drain. Diet has been advanced to a dysphagia #1 nectar thick liquid as well as the nasogastric tube remains in place for nutritional support.   Pt continues on a dysphagia 1 diet with nectar thick liquids. PO intake continues to be poor at 0-5%. Noted pt not oriented/confused. Pt continues on nocturnal tube feeds to aid in adequate nutrition. Per MD note, pt will need PEG. Tube feeding infusion time has been decreased to run over 12 hours to allow for encouragement of po intake during the day. RD to modify tube feeding to provide 100% of nutrition needs as pt has been refusing most po at meals. RD to continue to monitor.   Labs and medications reviewed.   Diet  Order:   Diet Order           DIET - DYS 1 Room service appropriate? Yes; Fluid consistency: Nectar Thick  Diet effective now          EDUCATION NEEDS:   Not appropriate for education at this time  Skin:  Skin Assessment: Skin Integrity Issues: Skin Integrity Issues:: Incisions Incisions: head  Last BM:  6/12  Height:   Ht Readings from Last 1 Encounters:  09/05/17 5' 1"  (1.549 m)    Weight:   Wt Readings from Last 1 Encounters:  09/12/17 143 lb 11.8 oz (65.2 kg)    Ideal Body Weight:  47.7 kg  BMI:  Body mass index is 27.16 kg/m.  Estimated Nutritional Needs:   Kcal:  1650-1850  Protein:  80-100 grams  Fluid:  Per MD    Corrin Parker, MS, RD, LDN Pager # [phone] After hours/ weekend pager # [phone]

## 2017-09-12 NOTE — Progress Notes (Signed)
Physical Therapy Session Note  Patient Details  Name: Carla Bauer MRN: [id number] Date of Birth: [birth year]  Today's Date: 09/12/2017 PT Individual Time: 0930-1015 PT Individual Time Calculation (min): 45 min  and Today's Date: 09/12/2017 PT Missed Time: 15 Minutes Missed Time Reason: Patient unwilling to participate  Short Term Goals: Week 2:  PT Short Term Goal 1 (Week 2): Pt will perform bed<>w/c transfer with max assist +1 PT Short Term Goal 2 (Week 2): Pt will ambulate x 30 ft with max assist +1 and LRAD PT Short Term Goal 3 (Week 2): Pt will initiate w/c propulsion   Skilled Therapeutic Interventions/Progress Updates:   Pt in TIS and agreeable to therapy, no evidence or c/o pain throughout session. Pt unresponsive to all questions and/or commands coming from this therapist, would only respond to therapy tech, directed majority of session through tech. Listened to music throughout session in attempts to engage pt w/ meaningful activity, session focused on eliciting purposeful and meaningful participation in therapy. Attempted to perform sit<>stands at rail in hallway, pt refused despite max encouragement from therapy tech to engage in standing dancing activities. Transitioned to performing kinetron w/ total assist overall to work on LE reciprocal movement pattern, however pt not engaging musculature from either leg (including L leg) despite max encouragement from therapy tech and manual/tactile cues from this therapist. Pt agreeable to eat a snack in attempts to engage pt w/ therapist and therapy tech. Provided mod verbal and hand-over-hand cues directed via therapy tech for pt to take 4 bites w/ LUE. Goal was to stand to standing frame to eat snack and look outside, however pt continued to refuse functional participation in session. No agitation noted this session except for 1 bout when this therapist was in pt's line of sight, pt verbally refused all participation. Returned to room and  ended session in w/c, call bell within reach and all needs met. Tilted in TIS w/ quick release belt donned. Missed 15 min of skilled PT 2/2 refusal.   Therapy Documentation Precautions:  Precautions Precautions: Fall Restrictions Weight Bearing Restrictions: (P) No General: PT Amount of Missed Time (min): 15 Minutes PT Missed Treatment Reason: Patient unwilling to participate  See Function Navigator for Current Functional Status.   Therapy/Group: Individual Therapy  Nafisa Olds K Arnette 09/12/2017, 10:19 AM

## 2017-09-12 NOTE — Progress Notes (Signed)
SLP Cancellation Note  Patient Details Name: Carla Bauer MRN: [id number] DOB: 01-10-1956   Cancelled treatment:        Pt received in bed, asleep but easily awakened to voice.  When she realized that I was a therapist, pt closed her eyes and pretended to be asleep.  When questioned pt reported that she "needed some shut eye" and would not engage with therapist.  As a result, pt missed 30 min of skilled ST.                                                                                                  Jaeger Trueheart, Melanee SpryNicole L 09/12/2017, 4:08 PM

## 2017-09-12 NOTE — Patient Care Conference (Signed)
Inpatient RehabilitationTeam Conference and Plan of Care Update Date: 09/11/2017   Time: 11:25 AM    Patient Name: Carla Bauer      Medical Record Number: [id number]  Date of Birth: [birth year] Sex: Female         Room/Bed: 4W16C/4W16C-01 Payor Info: Payor: MEDICAID POTENTIAL / Plan: MEDICAID POTENTIAL / Product Type: *No Product type* /    Admitting Diagnosis: ICH CORTRAK  Admit Date/Time:  09/03/2017  3:38 PM Admission Comments: No comment available   Primary Diagnosis:  <principal problem not specified> Principal Problem: <principal problem not specified>  Patient Active Problem List   Diagnosis Date Noted  . Intraparenchymal hematoma of brain (HCC) 09/03/2017  . Right hemiparesis (HCC)   . Oropharyngeal dysphagia   . Essential hypertension   . Acute lower UTI   . Tachypnea   . Tachycardia   . Reactive hypertension   . Prediabetes   . Acute blood loss anemia   . Hypokalemia   . Fever   . Leukocytosis   . Acute respiratory failure with hypoxia (HCC)   . 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 ICH (intracerebral hemorrhage) (HCC) 08/14/2017  . SAH (subarachnoid hemorrhage) (HCC) 08/14/2017  . 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 Hydrocephalus 08/14/2017  . Brain herniation (HCC) 08/14/2017  . Cytotoxic brain edema (HCC) 08/14/2017    Expected Discharge Date: Expected Discharge Date: (SNF)  Team Members Present: Physician leading conference: Dr. Claudette Laws Social Worker Present: Amada Jupiter, LCSW Nurse Present: Aubery Lapping, RN) PT Present: Woodfin Ganja, PT OT Present: Kearney Hard, OT SLP Present: Feliberto Gottron, SLP PPS Coordinator present : Tora Duck, RN, CRRN     Current Status/Progress Goal Weekly Team Focus  Medical   Aneurysm rupture left temporal hemorrhage intraventricular hemorrhage right hemiparesis a aphasia confusion and  dysphagia.  Avoid aspiration pneumonia, goal of oral intake with sufficient fluid intake  Continue rehabilitation program, repeat swallow evaluations   Bowel/Bladder   incontinent of bowel & bladder, urinary retention, cath Q 8 hrs  if no void or >344ml PVR, LBM 09/09/17  regain ability to void, less incontinent episodes  continue in/out caths & monitor   Swallow/Nutrition/ Hydration   Dys. 1 textures with nectar-thick liquids, Total A  Min A  participation, upgraded trials   ADL's   Max/total A for all BADL tasks and transfers, Refuses to participate mostly in BADL tasks  min A goals  participation, pt/family education, R NMR, transfers, standing balance   Mobility   max-total assist stedy transfer, min-mod assist sit<>stand in stedy, refuses all other mobility  min assist  participation in therapy, building pt rapport, all functional mobility, R NMR, R attention   Communication   Overall Max A  Mod A  participation    Safety/Cognition/ Behavioral Observations  Total A  Mod A  participation    Pain   no c/o pain, has tylenol prn  pain scale <2/10  assess & treat as needed   Skin   staples to scalp have been removed, incision intact  no new areas of skin break down  assess q shift    Rehab Goals Patient on target to meet rehab goals: Yes *See Care Plan and progress notes for long and short-term goals.     Barriers to Discharge  Current Status/Progress Possible Resolutions Date Resolved   Physician    Medical stability;Incontinence;Nutrition means     Very  slow progress towards goals  Continue rehab, NG tube feeds until p.o. improves, discussed with SLP, potential need for PEG      Nursing  Incontinence;Medication compliance;Lack of/limited family support;Decreased caregiver support               PT  Behavior  Pt w/ very poor participation in therapy, not meaningful when she does participate. Easily agitated and has been combative w/ therapists.  Daughter present during PT session  yesterday and verbalizes understanding of poor participation and possible need to make decision regarding moving on to next level of care.            OT                  SLP Behavior              SW Insurance for SNF coverage;Behavior              Discharge Planning/Teaching Needs:  Plan home with family IF they can coordinate 24/7 physical assist caregiver plan vs change to SNF  Teaching with family if plan confirmed d/c home.   Team Discussion:  Medically and behaviorally very complicated;  incont and requiring I/O caths prn.  Aggressive behavior and resistant to most staff except Licensed conveyancerunit secretary.  Still total assist with all activities.  tx have witnessed some movement in LE; apraxia.  Has been extremely difficulty to determine full range of deficits.  Very poor po and s/s of silent aspiration.  MD recommends change to 15/7 schedule and will discuss need for peg with family.  Anticipate change of d/c plan to SNF.  Revisions to Treatment Plan:  Decrease tx schedule and probable peg placment    Continued Need for Acute Rehabilitation Level of Care: The patient requires daily medical management by a physician with specialized training in physical medicine and rehabilitation for the following conditions: Daily direction of a multidisciplinary physical rehabilitation program to ensure safe treatment while eliciting the highest outcome that is of practical value to the patient.: Yes Daily analysis of laboratory values and/or radiology reports with any subsequent need for medication adjustment of medical intervention for : Nutritional problems;Neurological problems  Carla Bauer 09/12/2017, 4:16 PM

## 2017-09-13 ENCOUNTER — Inpatient Hospital Stay (HOSPITAL_COMMUNITY): Payer: Medicaid Other | Admitting: Speech Pathology

## 2017-09-13 ENCOUNTER — Inpatient Hospital Stay (HOSPITAL_COMMUNITY): Payer: Self-pay | Admitting: Occupational Therapy

## 2017-09-13 ENCOUNTER — Inpatient Hospital Stay (HOSPITAL_COMMUNITY): Payer: Self-pay | Admitting: Physical Therapy

## 2017-09-13 LAB — GLUCOSE, CAPILLARY
GLUCOSE-CAPILLARY: 139 mg/dL — AB (ref 65–99)
Glucose-Capillary: 174 mg/dL — ABNORMAL HIGH (ref 65–99)
Glucose-Capillary: 190 mg/dL — ABNORMAL HIGH (ref 65–99)
Glucose-Capillary: 201 mg/dL — ABNORMAL HIGH (ref 65–99)
Glucose-Capillary: 78 mg/dL (ref 65–99)
Glucose-Capillary: 97 mg/dL (ref 65–99)

## 2017-09-13 NOTE — Plan of Care (Signed)
  Problem: RH COGNITION-NURSING Goal: RH STG USES MEMORY AIDS/STRATEGIES W/ASSIST TO PROBLEM SOLVE Description STG Uses Memory Aids/Strategies With mod  Assistance to Problem Solve. Cues and supervision   Outcome: Progressing  Continue to maintain safety, bed/chair alarm, proper footwear

## 2017-09-13 NOTE — Progress Notes (Signed)
Patient is sleeping at this time, the rest of her medications were given. No acute distress noted.

## 2017-09-13 NOTE — Progress Notes (Signed)
Subjective/Complaints: Pt remains confused does not recognize me as her MD, discussed poor appetite and need for PEG daughter was contacted yesterday IR on board ROS: Limited due to cognitive/behavioral   Objective: Vital Signs: Blood pressure (!) 111/93, pulse 99, temperature 99.5 F (37.5 C), temperature source Oral, resp. rate 18, height 5' 1"  (1.549 m), weight 66.5 kg (146 lb 9.7 oz), SpO2 100 %. No results found. Results for orders placed or performed during the hospital encounter of 09/03/17 (from the past 72 hour(s))  Glucose, capillary     Status: Abnormal   Collection Time: 09/10/17 11:33 AM  Result Value Ref Range   Glucose-Capillary 104 (H) 65 - 99 mg/dL  Glucose, capillary     Status: None   Collection Time: 09/10/17  3:09 PM  Result Value Ref Range   Glucose-Capillary 84 65 - 99 mg/dL  Glucose, capillary     Status: Abnormal   Collection Time: 09/10/17  8:45 PM  Result Value Ref Range   Glucose-Capillary 151 (H) 65 - 99 mg/dL  Glucose, capillary     Status: Abnormal   Collection Time: 09/10/17 11:54 PM  Result Value Ref Range   Glucose-Capillary 170 (H) 65 - 99 mg/dL   Comment 1 QC Due   Glucose, capillary     Status: Abnormal   Collection Time: 09/11/17  3:58 AM  Result Value Ref Range   Glucose-Capillary 142 (H) 65 - 99 mg/dL  Glucose, capillary     Status: None   Collection Time: 09/11/17  8:12 AM  Result Value Ref Range   Glucose-Capillary 78 65 - 99 mg/dL  Basic metabolic panel     Status: Abnormal   Collection Time: 09/11/17 11:16 AM  Result Value Ref Range   Sodium 142 135 - 145 mmol/L   Potassium 4.2 3.5 - 5.1 mmol/L   Chloride 102 101 - 111 mmol/L   CO2 32 22 - 32 mmol/L   Glucose, Bld 182 (H) 65 - 99 mg/dL   BUN 19 6 - 20 mg/dL   Creatinine, Ser 0.66 0.44 - 1.00 mg/dL   Calcium 10.1 8.9 - 10.3 mg/dL   GFR calc non Af Amer >60 >60 mL/min   GFR calc Af Amer >60 >60 mL/min    Comment: (NOTE) The eGFR has been calculated using the CKD EPI  equation. This calculation has not been validated in all clinical situations. eGFR's persistently <60 mL/min signify possible Chronic Kidney Disease.    Anion gap 8 5 - 15    Comment: Performed at [facility] [address] [place], [place] [address]  Glucose, capillary     Status: Abnormal   Collection Time: 09/11/17 12:04 PM  Result Value Ref Range   Glucose-Capillary 150 (H) 65 - 99 mg/dL  Glucose, capillary     Status: Abnormal   Collection Time: 09/11/17  4:21 PM  Result Value Ref Range   Glucose-Capillary 106 (H) 65 - 99 mg/dL  Glucose, capillary     Status: Abnormal   Collection Time: 09/11/17  7:53 PM  Result Value Ref Range   Glucose-Capillary 166 (H) 65 - 99 mg/dL  Glucose, capillary     Status: Abnormal   Collection Time: 09/11/17 11:51 PM  Result Value Ref Range   Glucose-Capillary 183 (H) 65 - 99 mg/dL  Glucose, capillary     Status: Abnormal   Collection Time: 09/12/17  3:54 AM  Result Value Ref Range   Glucose-Capillary 165 (H) 65 - 99 mg/dL  Glucose,  capillary     Status: None   Collection Time: 09/12/17  8:03 AM  Result Value Ref Range   Glucose-Capillary 81 65 - 99 mg/dL  Glucose, capillary     Status: Abnormal   Collection Time: 09/12/17 11:42 AM  Result Value Ref Range   Glucose-Capillary 113 (H) 65 - 99 mg/dL  Glucose, capillary     Status: Abnormal   Collection Time: 09/12/17  4:47 PM  Result Value Ref Range   Glucose-Capillary 107 (H) 65 - 99 mg/dL  Glucose, capillary     Status: Abnormal   Collection Time: 09/12/17  9:20 PM  Result Value Ref Range   Glucose-Capillary 133 (H) 65 - 99 mg/dL  Glucose, capillary     Status: Abnormal   Collection Time: 09/13/17 12:21 AM  Result Value Ref Range   Glucose-Capillary 174 (H) 65 - 99 mg/dL  Glucose, capillary     Status: Abnormal   Collection Time: 09/13/17  6:07 AM  Result Value Ref Range   Glucose-Capillary 190 (H) 65 - 99 mg/dL     Constitutional: No distress . Vital signs  reviewed. HEENT: EOMI, oral membranes moist Neck: supple Cardiovascular: RRR without murmur. No JVD    Respiratory: CTA Bilaterally without wheezes or rales. Normal effort    GI: BS +, non-tender, non-distended  Extremity:  No Edema Skin:   Wound well healed incision Left crani, staples in Ventriculostomy placement site Neuro:confused, distracted.  Abnormal Sensory no response to pinch RUE, withdraws and winces to pinch R toes, Abnormal Motor 0/5 RUE and RLE, at least 4/5 on Left side, Abnormal FMC Ataxic/ dec FMC, Dysarthric and Aphasic Musc/Skel:  Other no pain with UE or LE ROM Gen NAD   Assessment/Plan: 1. 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 Physiatrist is providing close team supervision and 24 hour management of active medical problems listed below. Physiatrist and rehab team continue to assess barriers to discharge/monitor patient progress toward functional and medical goals. FIM: Function - Bathing Position: Bed Body parts bathed by patient: Chest, Abdomen Body parts bathed by helper: Right arm, Left arm, Front perineal area, Buttocks, Right upper leg, Left upper leg, Right lower leg, Left lower leg, Chest, Abdomen Assist Level: 2 helpers  Function- Upper Body Dressing/Undressing What is the patient wearing?: Pull over shirt/dress Pull over shirt/dress - Perfomed by patient: Put head through opening Pull over shirt/dress - Perfomed by helper: Thread/unthread right sleeve, Thread/unthread left sleeve, Pull shirt over trunk Assist Level: Touching or steadying assistance(Pt > 75%) Function - Lower Body Dressing/Undressing What is the patient wearing?: Pants Position: Sitting EOB Pants- Performed by helper: Thread/unthread right pants leg, Pull pants up/down, Thread/unthread left pants leg Non-skid slipper socks- Performed  by helper: Don/doff right sock, Don/doff left sock Assist for footwear: Dependant Assist for lower body dressing: Touching or steadying assistance (Pt > 75%)  Function - Toileting Toileting activity did not occur: No continent bowel/bladder event Toileting steps completed by helper: Adjust clothing prior to toileting, Performs perineal hygiene, Adjust clothing after toileting Assist level: Two helpers  Function - Air cabin crew transfer activity did not occur: Safety/medical concerns Toilet transfer assistive device: Facilities manager lift: Stedy Assist level to toilet: 2 helpers Assist level from toilet: 2 helpers  Function - Chair/bed transfer Chair/bed transfer method: Stand pivot Chair/bed transfer assist level: dependent (Pt equals 0%) Chair/bed transfer assistive device:  Mechanical lift Mechanical lift: Stedy Chair/bed transfer details: Manual facilitation for weight shifting, Manual facilitation for placement, Verbal cues for technique, Verbal cues for precautions/safety  Function - Locomotion: Wheelchair Will patient use wheelchair at discharge?: Yes Type: Manual Max wheelchair distance: 150 ft Assist Level: Dependent (Pt equals 0%) Assist Level: Dependent (Pt equals 0%) Assist Level: Dependent (Pt equals 0%) Turns around,maneuvers to table,bed, and toilet,negotiates 3% grade,maneuvers on rugs and over doorsills: No Function - Locomotion: Ambulation Ambulation activity did not occur: Safety/medical concerns Assistive device: Rail in hallway Max distance: 30 ft Assist level: 2 helpers Assist level: 2 helpers  Function - Comprehension Comprehension: Auditory Comprehension assist level: Understands basic 25 - 49% of the time/ requires cueing 50 - 75% of the time  Function - Expression Expression: Verbal Expression assist level: Expresses basis less than 25% of the time/requires cueing >75% of the time.  Function - Social Interaction Social  Interaction assist level: Interacts appropriately less than 25% of the time. May be withdrawn or combative.  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 SLP-  2. DVT Prophylaxis/Anticoagulation: Subcutaneous heparin. LE vascular study negative 3. Pain Management:Tylenol as needed 4. Mood:Provide emotional support  -Add scheduled Seroquel at night-reduced dose secondary to sedation 12.5  -Add as needed Seroquel for agitation.  Want to avoid oversedation 5. Neuropsych: This patientis notcapable of making decisions on herown behalf. 6. Skin/Wound Care:Routine skin checks 7. Fluids/Electrolytes/Nutrition:Routine in and outs with follow-up chemistries -NGT feedings, adjust rate/H20 as needed- dietary consult Meal intake has been 0%, on modified diet but has many oral phase issues related to poor awareness of food bolus in mouth 8.Dysphagia. Dysphagia #1 nectar liquids. Nasogastric tube feeds for nutritional support, will need  PEG  Given  9.Hyperglycemia induced from tube feeds. Continue Lantus insulin for coverage. CBG (last 3)  Recent Labs    09/12/17 2120 09/13/17 0021 09/13/17 0607  GLUCAP 133* 174* 190*  Good range for hospital control 6/14 10.Hypertension. Lopressor 50 mg twice daily Vitals:    09/12/17 2204 09/13/17 0603  BP: 109/86 (!) 111/93  Pulse: (!) 125 99  Resp: 16 18  Temp:  99.5 F (37.5 C)  SpO2: 99% 100%  BP is creeping up with mild sinus tachycardia- increase lopressor 11.  Anemia,Hgb 9.2 Baseline Hgb ?11.8, had some post op blood loss, will check stool guaic, Normal Fe studies 6/10 12.  Agitation due to confusion , balancing control of agitation with sedation  13. Sinus tachy- likely multifactorial anemia, not hypoxic ,6/12 normal BMET increase BB LOS (Days) 10 A FACE TO FACE EVALUATION WAS PERFORMED  Charlett Blake 09/13/2017, 7:35 AM

## 2017-09-13 NOTE — Progress Notes (Signed)
Social Work Patient ID: Carla Bauer, female   DOB: [birth year], 62 y.o.   MRN: [id number]   Have reviewed team conference with pt's daughter, Glee ArvinLatoya, who is aware that IR plans peg placement next week.  We discuss d/c plans and daughter states that she and her sister are still hoping that pt will be able to d/c home.  Explained that her participation has been very poor this week and that SNF remains a possibility if we cannot progress her function.  Dossie DerBecky Dupree, LCSW to resume case management upon her return on Monday.  Lonita Debes, LCSW

## 2017-09-13 NOTE — Progress Notes (Signed)
Occupational Therapy Session Note  Patient Details  Name: Arvid RightDorothy A Reynolds MRN: [id number] Date of Birth: June 01, 1955  Today's Date: 09/13/2017 OT Individual Time: [id number] OT Individual Time Calculation (min): 28 min  and Today's Date: 09/13/2017 OT Missed Time: 32 Minutes Missed Time Reason: Patient unwilling/refused to participate without medical reason  Short Term Goals: Week 2:  OT Short Term Goal 1 (Week 2): Pt will sit EOB with no more than mod A while participating in BADL task OT Short Term Goal 2 (Week 2): Pt will locate 1/4 grooming items at the sink with mod instructional cues OT Short Term Goal 3 (Week 2): Pt will complete toilet transfer with max +2 assist  Skilled Therapeutic Interventions/Progress Updates:    Pt greeted semi-reclined in wc. Tried to engage pt in visual scanning activity to locate items on R side of hallway. Pt adamantly refusing to answer OT questions stating " Did you hear what I said? I said No." Tried re-directing pt and utilized therapeutic use of self-however, pt just became more agitated. Returned pt to room and pt allowed OT to perform gentle ROM and massage to R UE for NMR and to decrease flexor tone. OT applied kinesiotape to RUE and left pt seated in wc with safety belt on and wc tilted back.   Therapy Documentation Precautions:  Precautions Precautions: Fall Restrictions Weight Bearing Restrictions: No General: General OT Amount of Missed Time: 32 Minutes Pain:  none/denies pain ADL: ADL ADL Comments: Please see functional navigator  See Function Navigator for Current Functional Status.  Therapy/Group: Individual Therapy  Mal Amabilelisabeth S Laikynn Pollio 09/13/2017, 1:34 PM

## 2017-09-13 NOTE — Progress Notes (Signed)
Speech Language Pathology Daily Session Note  Patient Details  Name: Carla Bauer MRN: [id number] Date of Birth: 1955/07/31  Today's Date: 09/13/2017 SLP Individual Time: 0830-0905 SLP Individual Time Calculation (min): 35 min  Short Term Goals: Week 2: SLP Short Term Goal 1 (Week 2): Pt will verbally respond to basic, personally, relevant questions in 25% of opportunities with max assist multimodal cues.  SLP Short Term Goal 2 (Week 2): Pt will follow 1 step commands during basic, structured tasks for 75% accuracy with max assist multimodal cues.  SLP Short Term Goal 3 (Week 2): Pt will focus her attention to a targeted stimulus in 75% of opportunities with max assist multimodal cues.   SLP Short Term Goal 4 (Week 2): Pt will locate items at midline in 50% of opportunities during basic, structured tasks with max assist multimodal cues.  SLP Short Term Goal 5 (Week 2): Pt will consume dys 1 textures and nectar thick liquids with max assist multimodal cues for use of swallowing precautions and no overt s/s of aspiration.   Skilled Therapeutic Interventions: Skilled treatment session focused on cognitive goals. Upon arrival, patient was awake while upright in bed. Patient declined to participate in basic self-care tasks such as brushing teeth despite hand over hand assist. Therefore, trials of ice chips were not administered. However, patient independently requested coffee. SLP provided patient with nectar-thick coffee in which she took 1 sip of with Max A multimodal cues. Patient demonstrated language of confusion throughout session and required total A for orientation. Patient missed remaining 10 mins of session due to declining participation. Continue with current plan of care.      Function:   Cognition Comprehension Comprehension assist level: Understands basic 25 - 49% of the time/ requires cueing 50 - 75% of the time  Expression   Expression assist level: Expresses basis less than  25% of the time/requires cueing >75% of the time.  Social Interaction Social Interaction assist level: Interacts appropriately less than 25% of the time. May be withdrawn or combative.  Problem Solving Problem solving assist level: Solves basic less than 25% of the time - needs direction nearly all the time or does not effectively solve problems and may need a restraint for safety  Memory Memory assist level: Recognizes or recalls less than 25% of the time/requires cueing greater than 75% of the time    Pain No/Denies Pain   Therapy/Group: Individual Therapy  Zylee Marchiano 09/13/2017, 3:34 PM

## 2017-09-13 NOTE — Consult Note (Signed)
Chief Complaint: Patient was seen in consultation today for percutaneous gastric tube placement at the request of Dr Carleene Overlie  Supervising Physician: Irish Lack  Patient Status: [facility] - In-pt  History of Present Illness: Carla Bauer is a 62 y.o. female   Left craniotomy clipping of aneurysm Intraparenchymal hematoma Ventricular drain---all 08/14/2017  Oral phase dysfunction Malnutrition Dysphagia  Aspiration risk SLP Diet Recommendations Dysphagia 1 (Puree) solids;Nectar thick liquid     Requesting percutaneous gastric tube placement Dr Fredia Sorrow has reviewed imaging and approves procedure Scheduled now for early next week in IR   History reviewed. No pertinent past medical history.  Past Surgical History:  Procedure Laterality Date  . CESAREAN SECTION    . 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 LAPAROSCOPIC REVISION VENTRICULAR-PERITONEAL (V-P) SHUNT N/A 08/29/2017   Procedure: LAPAROSCOPIC REVISION VENTRICULAR-PERITONEAL (V-P) SHUNT;  Surgeon: Abigail Miyamoto, MD;  Location: [facility] OR;  Service: General;  Laterality: N/A;  LAPAROSCOPIC REVISION VENTRICULAR-PERITONEAL (V-P) SHUNT  . VENTRICULOPERITONEAL SHUNT Right 08/29/2017   Procedure: SHUNT INSERTION VENTRICULAR-PERITONEAL;  Surgeon: Lisbeth Renshaw, MD;  Location: [facility] OR;  Service: Neurosurgery;  Laterality: Right;  SHUNT INSERTION VENTRICULAR-PERITONEAL    Allergies: Patient has no known allergies.  Medications: Prior to Admission medications   Medication Sig Start Date End Date Taking? Authorizing Provider  acetaminophen (TYLENOL) 500 MG tablet Take 1 tablet (500 mg total) by mouth every 6 (six) hours as needed. Patient taking differently: Take 500 mg by mouth every 6 (six) hours as needed for mild pain.  10/09/13   Junious Silk, PA-C   aspirin 81 MG chewable tablet Chew 1 tablet (81 mg total) by mouth daily. 09/04/17   Costella, Darci Current, PA-C  niMODipine (NYMALIZE) 60 MG/20ML SOLN Place 20 mLs (60 mg total) into feeding tube every 4 (four) hours for 3 days. 09/03/17 09/06/17  Costella, Darci Current, PA-C     History reviewed. No pertinent family history.  Social History   Socioeconomic History  . Marital status: Single    Spouse name: Not on file  . Number of children: Not on file  . Years of education: Not on file  . Highest education level: Not on file  Occupational History  . Not on file  Social Needs  . Financial resource strain: Not on file  . Food insecurity:    Worry: Not on file    Inability: Not on file  . Transportation needs:    Medical: Not on file    Non-medical: Not on file  Tobacco Use  . Smoking status: Never Smoker  . Smokeless tobacco: Never Used  Substance and Sexual Activity  . Alcohol use: No  . Drug use: No  . Sexual activity: Not on file  Lifestyle  . Physical activity:    Days per week: Not on file    Minutes per session: Not on file  . Stress: Not on file  Relationships  . Social connections:    Talks on phone: Not on file    Gets together: Not on file    Attends religious service: Not on file    Active member of club or organization: Not on file    Attends meetings of clubs or organizations: Not on file    Relationship status: Not on file  Other Topics Concern  . Not on file  Social History Narrative  .  Not on file    Review of Systems: A 12 point ROS discussed and pertinent positives are indicated in the HPI above.  All other systems are negative.  Review of Systems  Constitutional: Positive for activity change, appetite change and fatigue. Negative for fever.  Cardiovascular: Negative for chest pain.  Gastrointestinal: Negative for abdominal pain.  Neurological: Positive for weakness.  Psychiatric/Behavioral: Positive for confusion. Negative for behavioral problems.     Vital Signs: BP 110/73   Pulse (!) 114   Temp 99.5 F (37.5 C) (Oral)   Resp 18   Ht 5\' 1"  (1.549 m)   Wt 146 lb 9.7 oz (66.5 kg)   SpO2 100%   BMI 27.70 kg/m   Physical Exam  Cardiovascular: Normal rate, regular rhythm and normal heart sounds.  Pulmonary/Chest: Effort normal and breath sounds normal.  Abdominal: Soft. Bowel sounds are normal.  Musculoskeletal: Normal range of motion.  Neurological: She is alert.  Skin: Skin is warm and dry.  Psychiatric: She has a normal mood and affect.  Consented with Dtr Carla Bauer via phone  Nursing note and vitals reviewed.   Imaging: Ct Abdomen Wo Contrast  Result Date: 09/13/2017 CLINICAL DATA:  Dysphagia and assessment prior to possible percutaneous gastrostomy tube placement. EXAM: CT ABDOMEN WITHOUT CONTRAST TECHNIQUE: Multidetector CT imaging of the abdomen was performed following the standard protocol without IV contrast. COMPARISON:  None. FINDINGS: Lower chest: No acute abnormality. Hepatobiliary: No focal liver abnormality is seen. No gallstones, gallbladder wall thickening, or biliary dilatation. Pancreas: Unremarkable. No pancreatic ductal dilatation or surrounding inflammatory changes. Spleen: Normal in size without focal abnormality. Adrenals/Urinary Tract: Adrenal glands are unremarkable. Kidneys are normal, without renal calculi, focal lesion, or hydronephrosis. Stomach/Bowel: No hiatal hernia. Gastric anatomy appears normal with a feeding tube present extending into the proximal duodenum. Gastric positioning is amenable to percutaneous gastrostomy with the stomach lying superior to the transverse colon. No evidence of bowel obstruction, ileus or free air. No abnormal fluid collections or ascites identified. Vascular/Lymphatic: No significant vascular findings are present. No enlarged abdominal or pelvic lymph nodes. Other: No hernias identified. Intra-abdominal segment a ventriculoperitoneal shunt catheter is identified which  enters the peritoneal cavity through the right abdominal wall with the catheter coursing in the right lower abdomen. Musculoskeletal: No acute or significant osseous findings. IMPRESSION: 1. Gastric anatomy appears amenable to percutaneous gastrostomy tube placement with no anatomic findings that would preclude gastrostomy or significantly increase risk. 2. No acute findings in the abdomen. 3. VP shunt catheter tubing in the right peritoneal cavity. Electronically Signed   By: Irish Lack M.D.   On: 09/13/2017 08:11   Ct Angio Head W Or Wo Contrast  Result Date: 08/16/2017 CLINICAL DATA:  62 y/o F; cerebral aneurysm and subarachnoid hemorrhage. Evaluation for cerebral vasospasm. EXAM: CT ANGIOGRAPHY HEAD TECHNIQUE: Multidetector CT imaging of the head was performed using the standard protocol during bolus administration of intravenous contrast. Multiplanar CT image reconstructions and MIPs were obtained to evaluate the vascular anatomy. CONTRAST:  50 cc Isovue 370 COMPARISON:  08/14/2017 CT angiogram of the head and CT of the head. FINDINGS: CT HEAD Brain: Mild interval decrease in volume of intraventricular hemorrhage. Stable subdural collection over the left anterior frontal convexity subjacent to frontal and temporal craniotomy. Near complete resolution of pneumocephalus. Decreased mass effect with 7 mm of left-to-right midline shift, previously 9 mm. Increased hypoattenuation within the left medial temporal lobe extending into the left posterior lentiform nucleus and posterior limb of internal capsule likely  representing evolving early subacute infarction. No new acute intracranial hemorrhage or herniation. Right frontal approach ventriculostomy catheter is stable in position. No hydrocephalus. Vascular: As below. Skull: Postsurgical changes related to a left frontal and temporal craniotomy with mildly increased edema, but decreased air in the overlying scalp. Sinuses: Imaged portions are clear. Orbits:  No acute finding. CTA HEAD Anterior circulation: No significant stenosis, proximal occlusion, new aneurysm, or vascular malformation. Clips left posterior communicating artery aneurysm without residual or recurrent aneurysm identified. Posterior circulation: No significant stenosis, proximal occlusion, aneurysm, or vascular malformation. Venous sinuses: As permitted by contrast timing, patent. Anatomic variants: None significant. Delayed phase: No abnormal intracranial enhancement. IMPRESSION: CT head: 1. Increasing hypoattenuation within the left medial temporal lobe extending to left posterior lentiform nucleus and posterior limb of internal capsule compatible developing early subacute infarction. 2. Mild decrease in volume of intraventricular hemorrhage. 3. Stable subdural collection over the left anterior frontal convexity subjacent to the left frontal and temporal craniotomy. 4. Decreased mass effect with 7 mm of left-to-right midline shift, previously 9 mm. CTA head: 1. No findings of vasospasm. No new large vessel occlusion, aneurysm, or significant stenosis. 2. Left posterior communicating artery aneurysm clip. No residual or recurrent aneurysm identified. Electronically Signed   By: Mitzi Hansen M.D.   On: 08/16/2017 02:20   Dg Abd 1 View  Result Date: 08/15/2017 CLINICAL DATA:  Orogastric tube placement EXAM: ABDOMEN - 1 VIEW COMPARISON:  None. FINDINGS: Orogastric tube coiled in the stomach with the tip projecting over the cardia of the stomach. There is no bowel dilatation to suggest obstruction. There is no evidence of pneumoperitoneum, portal venous gas or pneumatosis. There are no pathologic calcifications along the expected course of the ureters. The osseous structures are unremarkable. IMPRESSION: Orogastric tube coiled in the stomach with the tip projecting over the cardia of the stomach. Electronically Signed   By: Elige Ko   On: 08/15/2017 11:08   Ct Head Wo  Contrast  Result Date: 08/25/2017 CLINICAL DATA:  Subarachnoid hemorrhage.  Status post craniotomy. EXAM: CT HEAD WITHOUT CONTRAST TECHNIQUE: Contiguous axial images were obtained from the base of the skull through the vertex without intravenous contrast. COMPARISON:  08/19/2017 FINDINGS: Brain: Decreased size of extra-axial collection underlying the left pterional craniotomy site with linear hyperdensity that may indicate dural graft material. Small amount of subdural blood along the left tentorial leaflet is unchanged. Hypoattenuation along the posterior limb of the left internal capsule is unchanged. There are aging Blood products in the left lateral ventricle. The ventricles are slightly larger than on the prior study but there is no hydrocephalus. There is no acute hemorrhage. There is a right frontal approach ventriculostomy catheter with tip in the right lateral ventricle frontal horn. Rightward midline shift measures 4 mm, previously 5 mm. Vascular: Left P-comm aneurysm clip. No abnormal hyperdensity of the dural venous sinuses or major intracranial arteries. Skull: Recent left pterional craniotomy and right frontal burr hole. Skin staples overlie the surgical sites. Sinuses/Orbits: No fluid levels or advanced mucosal thickening of the visualized paranasal sinuses. No mastoid or middle ear effusion. The orbits are normal. IMPRESSION: Continued decrease in size of left convexity subdural hematoma. Improving rightward midline shift. No hydrocephalus or new site of hemorrhage.Expected evolution of left internal capsule posterior limb infarct. Electronically Signed   By: Deatra Robinson M.D.   On: 08/25/2017 05:26   Ct Head Wo Contrast  Result Date: 08/19/2017 CLINICAL DATA:  Follow-up examination for intracranial hemorrhage. EXAM: CT HEAD  WITHOUT CONTRAST TECHNIQUE: Contiguous axial images were obtained from the base of the skull through the vertex without intravenous contrast. COMPARISON:  Prior CT from  08/18/2017 FINDINGS: Brain: Postoperative changes from prior left frontotemporal craniotomy for surgical clipping of left P com aneurysm again seen. Continued interval decrease in blood products at the anterior left temporal lobe, now only faintly visible. Extra-axial collection overlying the left frontotemporal convexity is unchanged without significant mass effect. Small volume subdural hemorrhage along the left tentorium relatively unchanged as well. 5 mm of left-to-right midline shift is stable. Right frontal approach ventriculostomy catheter in place with tip in the right lateral ventricle. Intraventricular hemorrhage is slightly decreased from previous. Stable ventricular size. No hydrocephalus or ventricular trapping. Evolving subacute infarct at the lateral left thalamus/posterior limb of the left internal capsule with inferior extension again noted, stable. No new intracranial hemorrhage or large vessel territory infarct. No other new acute intracranial abnormality. Atrophy with chronic small vessel ischemic changes again noted. Vascular: Aneurysm clip in the region of the left posterior communicating artery. No hyperdense vessel. Skull: Postoperative changes from left frontotemporal craniotomy. Skin staples remain in place. Overlying scalp edema and swelling is increased from previous. Sinuses/Orbits: No acute finding about the globes and orbital soft tissues. Paranasal sinuses remain clear. Nasogastric tube partially visualized. No significant mastoid effusion. Other: None. IMPRESSION: 1. Postoperative changes from prior left frontotemporal craniotomy for surgical clipping of left P com aneurysm and left temporal hematoma evacuation. Hemorrhage at the anterior left temporal lobe continues to decrease. 5 mm of left-to-right shift stable from previous. 2. Right frontal approach ventriculostomy in place with continued interval decrease in intraventricular hemorrhage. No hydrocephalus or ventricular trapping.  3. Unchanged small volume subdural hemorrhage along the left tentorium. 4. Continued interval evolution of subacute ischemic infarct at the lateral left thalamus/posterior limb of the left internal capsule. 5. No other new acute intracranial abnormality. Electronically Signed   By: Rise Mu M.D.   On: 08/19/2017 06:22   Ct Head Wo Contrast  Result Date: 08/18/2017 CLINICAL DATA:  Follow-up examination for subarachnoid hemorrhage. EXAM: CT HEAD WITHOUT CONTRAST TECHNIQUE: Contiguous axial images were obtained from the base of the skull through the vertex without intravenous contrast. COMPARISON:  Prior CT from 08/14/2017 FINDINGS: Brain: Postoperative changes from prior left frontotemporal craniotomy for surgical clipping of left P com aneurysm again seen. Postoperative pneumocephalus has essentially resolved. Sequelae of prior evacuation of left temporal lobe hematoma with decreasing hemorrhage. Persistent extra-axial collection overlies the anterior left frontotemporal convexity without significant mass effect. Persistent small volume subdural hemorrhage along the left tentorium measuring up to 4 mm in maximal thickness. Persistent 5 mm of left-to-right shift, improved from previous. Right frontal approach ventriculostomy remains in place with tip position within the right lateral ventricle. Markedly decreased intraventricular hemorrhage as compared to previous. No hydrocephalus or ventricular trapping. Evolving 2.2 cm subacute ischemic infarct involving the lateral left thalamus/posterior limb of the left internal capsule with inferior extension. Vascular: Aneurysm clip in the region of the left posterior communicating artery. No new hyperdense vessel. Skull: Postoperative changes from prior left frontotemporal craniotomy. Skin staples remain in place. Sinuses/Orbits: Globes and orbital soft tissues within normal limits. Nasogastric tube partially visualized. Paranasal sinuses and mastoid air cells  are largely clear. Other: None. IMPRESSION: 1. Postoperative changes from prior left frontotemporal craniotomy for surgical clipping of left P com aneurysm and left temporal hematoma evacuation. Resolved postoperative pneumocephalus with improved left-to-right shift now measuring 5 mm. 2. Right frontal approach  ventriculostomy in place with decreased intraventricular hemorrhage. No hydrocephalus. 3. Persistent small volume subdural hemorrhage along the left tentorium. 4. Evolving subacute ischemic infarct at the lateral left thalamus/posterior limb of the left internal capsule. 5. No other new acute intracranial abnormality. Electronically Signed   By: Rise Mu M.D.   On: 08/18/2017 04:55   Ct Head Wo Contrast  Result Date: 08/14/2017 CLINICAL DATA:  62 y/o  F; 08/14/2017 CT head and CTA head. EXAM: CT HEAD WITHOUT CONTRAST TECHNIQUE: Contiguous axial images were obtained from the base of the skull through the vertex without intravenous contrast. COMPARISON:  08/14/2017 CT head and CTA head. FINDINGS: Brain: Interval left frontal and temporal craniotomy postsurgical changes with air and edema in the overlying scalp. Small volume pneumocephalus overlying the frontal lobes. Interval placement of a left posterior communicating artery aneurysm clip in the left suprasellar cistern. Interval evacuation of hematoma within the left anterior temporal lobe with small residual fluid-filled cavity. Stable volume of intraventricular hemorrhage within the left greater than right lateral ventricles, third ventricle, and fourth ventricle. Stable small volume of hemorrhage overlying the left tentorium cerebelli. Interval placement of a right frontal approach ventriculostomy catheter with tip in the frontal horn of right lateral ventricle. Stable ventricle size. Decreased left-to-right midline shift of 9 mm, previously 11 mm. No downward herniation. Vascular: Interval placement of left suprasellar cistern aneurysm  clips. Skull: Postsurgical changes related to left frontal and temporal craniotomy with air and edema in the overlying scalp as well as skin staples. Sinuses/Orbits: No acute finding. Other: None. IMPRESSION: 1. Interval left frontal and temporal craniotomy, left anterior temporal lobe hematoma evacuation, right frontal approach ventriculostomy catheter, and clipping of left posterior communicating artery aneurysm. 2. Associated postsurgical changes with air and edema in the overlying scalp and small volume of pneumocephalus. 3. Stable residual intraventricular hemorrhage and subdural along the left tentorium cerebelli. 4. No new acute intracranial abnormality. 5. Decreased mass effect with 9 mm left-to-right midline shift, previously 11 mm. Electronically Signed   By: Mitzi Hansen M.D.   On: 08/14/2017 23:20   US Renal  Result Date: 08/22/2017 CLINICAL DATA:  Urinary tract infection EXAM: RENAL / URINARY TRACT ULTRASOUND COMPLETE COMPARISON:  None FINDINGS: Right Kidney: Length: 10.5 cm. Normal cortical thickness and echogenicity. No mass, hydronephrosis or shadowing calcification. Left Kidney: Length: 11.7 cm. Normal cortical thickness and echogenicity. No mass, hydronephrosis or shadowing calcification. Bladder: Well distended, normal appearance., without mass or wall thickening. IMPRESSION: Normal exam. Electronically Signed   By: Ulyses Southward M.D.   On: 08/22/2017 16:34   Dg Chest Port 1 View  Result Date: 08/22/2017 CLINICAL DATA:  Tachycardia and fever EXAM: PORTABLE CHEST 1 VIEW COMPARISON:  Aug 17, 2017 and Aug 20, 2017 FINDINGS: There is atelectatic change in the right lower lobe and medial left base regions. No frank edema or consolidation. Heart size and pulmonary vascularity are normal. No adenopathy. Feeding tube tip is below the diaphragm. No bone lesions. IMPRESSION: Areas of atelectatic change in the base regions. No edema or consolidation. Stable cardiac silhouette. Feeding tube  tip below diaphragm. Electronically Signed   By: Bretta Bang III M.D.   On: 08/22/2017 08:48   Dg Chest Port 1 View  Result Date: 08/20/2017 CLINICAL DATA:  Leukocytosis EXAM: PORTABLE CHEST 1 VIEW COMPARISON:  Chest x-ray of 08/19/2017 FINDINGS: Linear atelectasis or scarring remains at the right lung base. No pneumonia or effusion is seen. Mediastinal and hilar contours are unremarkable and the heart is  within upper limits of normal. A feeding tube extends below the hemidiaphragm. IMPRESSION: No change in linear atelectasis or scarring at the right lung base. No active lung disease. Electronically Signed   By: Dwyane Dee M.D.   On: 08/20/2017 11:31   Dg Chest Port 1 View  Result Date: 08/19/2017 CLINICAL DATA:  Fever EXAM: PORTABLE CHEST 1 VIEW COMPARISON:  Portable exam 1208 hours compared to 08/18/2017 FINDINGS: Feeding tube traverses esophagus into stomach. Normal heart size, mediastinal contours, and pulmonary vascularity. Mild bibasilar atelectasis. Lungs otherwise clear. No infiltrate, pleural effusion or pneumothorax. Bones unremarkable. IMPRESSION: Mild bibasilar atelectasis. Electronically Signed   By: Ulyses Southward M.D.   On: 08/19/2017 12:32   Dg Chest Port 1 View  Result Date: 08/18/2017 CLINICAL DATA:  Patient with fever. EXAM: PORTABLE CHEST 1 VIEW COMPARISON:  Chest radiograph 08/17/2017 FINDINGS: Enteric tube courses inferior to the diaphragm. Stable cardiac and mediastinal contours. No consolidative pulmonary opacities. No pleural effusion or pneumothorax. IMPRESSION: No acute cardiopulmonary process. Electronically Signed   By: Annia Belt M.D.   On: 08/18/2017 13:39   Dg Chest Port 1 View  Result Date: 08/17/2017 CLINICAL DATA:  Acute respiratory failure with hypoxia EXAM: PORTABLE CHEST 1 VIEW COMPARISON:  None. FINDINGS: Nasoenteric feeding tube at least reaches the stomach. Extensive artifact from EKG leads. Linear opacity at the right base favoring atelectasis. There is  low volumes with interstitial crowding. No Kerley lines, effusion, or pneumothorax. Borderline cardiomegaly, accentuated by technique. IMPRESSION: Low volumes with mild atelectasis on the right. Electronically Signed   By: Marnee Spring M.D.   On: 08/17/2017 08:18   Dg Swallowing Func-speech Pathology  Result Date: 09/02/2017 Objective Swallowing Evaluation: Type of Study: MBS-Modified Barium Swallow Study  Patient Details Name: Carla Bauer MRN: [id number] Date of Birth: [birth year] Today's Date: 09/02/2017 Time: SLP Start Time (ACUTE ONLY): 1145 -SLP Stop Time (ACUTE ONLY): 1218 SLP Time Calculation (min) (ACUTE ONLY): 33 min Past Medical History: No past medical history on file. Past Surgical History: Past Surgical History: Procedure Laterality Date . CESAREAN SECTION   . 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 LAPAROSCOPIC REVISION VENTRICULAR-PERITONEAL (V-P) SHUNT N/A 08/29/2017  Procedure: LAPAROSCOPIC REVISION VENTRICULAR-PERITONEAL (V-P) SHUNT;  Surgeon: Abigail Miyamoto, MD;  Location: [facility] OR;  Service: General;  Laterality: N/A;  LAPAROSCOPIC REVISION VENTRICULAR-PERITONEAL (V-P) SHUNT . VENTRICULOPERITONEAL SHUNT Right 08/29/2017  Procedure: SHUNT INSERTION VENTRICULAR-PERITONEAL;  Surgeon: Lisbeth Renshaw, MD;  Location: [facility] OR;  Service: Neurosurgery;  Laterality: Right;  SHUNT INSERTION VENTRICULAR-PERITONEAL HPI: Ms. Carla Bauer is a 62 y.o. female with no significant history found having fallen out of the bed with R arm weakness and altered mental status. 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 Intubated ETT 5/15- 5/17.  Subjective: Pt in bed, left gaze preference Assessment / Plan / Recommendation CHL IP CLINICAL IMPRESSIONS 09/02/2017 Clinical Impression Pt  demonstrates a primary oral dysphagia secondary to decreased initiation and awareness as well as right buccal and labial motor and sensory deficits. When pt is maximally attentive she is able to grasp and cup and take a sip of liquid with min tactile and verbal cues to initiate. Even in best conditions there is some right buccal residual and posterior spillage to pharynx. With thin liquids this leads to intermittent sensed penetration and aspiration before  the swallow. Nectar is tolerated even if majority of bolus reaches the pyrforms prior to swallow initiation. As study progressed however pt became less attentive and oral holding increased, particularly with puree. SLP was able to elicit a final swallow with a liquid wash but oral suction was still needed to remove a significant amount of oral residue. Given excellent airway protection with nectar and puree and variable cognitive function will initiate a Dys 1/nectar diet and monitor for intake. Pts automaticity and awareness may improve, or pt may have severe oral holding and limited nutritional support. Will need to trial diet to determine this and follow closely for staff education and tolerance.  SLP Visit Diagnosis Dysphagia, oropharyngeal phase (R13.12) Attention and concentration deficit following Cerebral infarction Frontal lobe and executive function deficit following -- Impact on safety and function Severe aspiration risk   CHL IP TREATMENT RECOMMENDATION 09/02/2017 Treatment Recommendations Therapy as outlined in treatment plan below   Prognosis 09/02/2017 Prognosis for Safe Diet Advancement Good Barriers to Reach Goals -- Barriers/Prognosis Comment -- CHL IP DIET RECOMMENDATION 09/02/2017 SLP Diet Recommendations Dysphagia 1 (Puree) solids;Nectar thick liquid Liquid Administration via Cup;Spoon Medication Administration Crushed with puree Compensations Slow rate;Small sips/bites;Monitor for anterior loss;Minimize environmental distractions Postural Changes  Seated upright at 90 degrees   CHL IP OTHER RECOMMENDATIONS 09/02/2017 Recommended Consults -- Oral Care Recommendations Oral care before and after PO Other Recommendations Have oral suction available;Order thickener from pharmacy   CHL IP FOLLOW UP RECOMMENDATIONS 09/02/2017 Follow up Recommendations Inpatient Rehab   CHL IP FREQUENCY AND DURATION 09/02/2017 Speech Therapy Frequency (ACUTE ONLY) min 2x/week Treatment Duration 2 weeks      CHL IP ORAL PHASE 09/02/2017 Oral Phase Impaired Oral - Pudding Teaspoon -- Oral - Pudding Cup -- Oral - Honey Teaspoon -- Oral - Honey Cup -- Oral - Nectar Teaspoon Lingual pumping;Reduced posterior propulsion;Holding of bolus;Right anterior bolus loss;Pocketing in anterior sulcus;Right pocketing in lateral sulci;Delayed oral transit;Premature spillage Oral - Nectar Cup Lingual pumping;Reduced posterior propulsion;Holding of bolus;Right anterior bolus loss;Pocketing in anterior sulcus;Right pocketing in lateral sulci;Delayed oral transit;Premature spillage Oral - Nectar Straw Lingual pumping;Reduced posterior propulsion;Holding of bolus;Right anterior bolus loss;Pocketing in anterior sulcus;Right pocketing in lateral sulci;Delayed oral transit;Premature spillage Oral - Thin Teaspoon -- Oral - Thin Cup Lingual pumping;Reduced posterior propulsion;Right anterior bolus loss;Delayed oral transit;Premature spillage Oral - Thin Straw -- Oral - Puree Lingual pumping;Reduced posterior propulsion;Holding of bolus;Right anterior bolus loss;Pocketing in anterior sulcus;Right pocketing in lateral sulci;Delayed oral transit;Premature spillage Oral - Mech Soft -- Oral - Regular -- Oral - Multi-Consistency -- Oral - Pill -- Oral Phase - Comment --  No flowsheet data found. No flowsheet data found. No flowsheet data found. DeBlois, Riley Nearing 09/02/2017, 1:21 PM               Labs:  CBC: Recent Labs    08/23/17 0315 08/28/17 0242 09/03/17 1609 09/04/17 0655  WBC 19.8* 15.5* 9.7 8.8  HGB  10.7* 9.7* 9.9* 9.2*  HCT 33.6* 30.5* 31.6* 29.1*  PLT 389 444* 376 370    COAGS: Recent Labs    08/14/17 0648  INR 1.01  APTT 25    BMP: Recent Labs    08/27/17 0249 08/28/17 0242 09/03/17 1609 09/04/17 0655 09/11/17 1116  NA 147* 142  --  145 142  K 3.4* 3.4*  --  3.1* 4.2  CL 115* 108  --  105 102  CO2 23 24  --  30 32  GLUCOSE 165* 190*  --  150* 182*  BUN 18 13  --  11 19  CALCIUM 9.4 9.5  --  9.6 10.1  CREATININE 0.55 0.55 0.50 0.49 0.66  GFRNONAA >60 >60 >60 >60 >60  GFRAA >60 >60 >60 >60 >60    LIVER FUNCTION TESTS: Recent Labs    08/14/17 0648 09/04/17 0655  BILITOT 0.6 0.3  AST 22 26  ALT 16 24  ALKPHOS 66 113  PROT 7.5 6.6  ALBUMIN 3.9 2.9*    TUMOR MARKERS: No results for input(s): AFPTM, CEA, CA199, CHROMGRNA in the last 8760 hours.  Assessment and Plan:  Cerebral aneurysm clipping-- hematoma-- craniotomy Dysphagia Malnutrition High aspiration risk Scheduled for gastric tube prob Mon next week Risks and benefits discussed with the patient's daughter Carla Smilingatasha via phone including, but not limited to the need for a barium enema during the procedure, bleeding, infection, peritonitis, or damage to adjacent structures.  All of her questions were answered, she is agreeable to proceed. Consent signed and in chart.    Thank you for this interesting consult.  I greatly enjoyed meeting Carla Bauer and look forward to participating in their care.  A copy of this report was sent to the requesting provider on this date.  Electronically Signed: Robet LeuURPIN,Tylisa Alcivar A, PA-C 09/13/2017, 10:47 AM   I spent a total of 40 Minutes    in face to face in clinical consultation, greater than 50% of which was counseling/coordinating care for percutaneous G tube

## 2017-09-13 NOTE — Progress Notes (Signed)
Physical Therapy Session Note  Patient Details  Name: Carla Bauer MRN: [id number] Date of Birth: [birth year]  Today's Date: 09/13/2017 PT Individual Time: 1100-1155 PT Individual Time Calculation (min): 55 min   Short Term Goals: Week 2:  PT Short Term Goal 1 (Week 2): Pt will perform bed<>w/c transfer with max assist +1 PT Short Term Goal 2 (Week 2): Pt will ambulate x 30 ft with max assist +1 and LRAD PT Short Term Goal 3 (Week 2): Pt will initiate w/c propulsion   Skilled Therapeutic Interventions/Progress Updates:   Pt in supine and agreeable to get OOB with this therapist. 2nd helper present providing encouragement to participate as well. Transferred to EOB w/ mod assist, total assist to don pants and pull over hips once in standing. Transferred to TIS via stand pivot w/ max assist. Session focused on pt participation and RLE NMR this session. Ambulated at rail in hallway w/ max assist overall for postural control, lateral weight shifting, and RLE management. Total assist to swing RLE through, for step placement, and R quad activation. Pt ambulated 20' and 2nd helper providing verbal and tactile cues for postural control. Attempted to engage pt in 2nd bout of gait but refusing despite max encouragement from therapist and 2nd helper. Pt agreeable to stand and dance to music we were listening to during session. Stood w/ min assist to maintain upright posture and facilitate anterior weight shifting. Facilitated lateral weight shifting to beat of music w/ tactile and manual cues for R quad activation in R weight acceptance. Stood x2 reps for 30-60 sec each time. Returned to room and assisted pt w/ self-care tasks, pt more friendly towards this therapist than previous sessions. Provided min assist to doff and don new gown and set-up assist w/ washing face w/ verbal cues to wash R side of face. Set-up assist w/ lunch tray and encouragement to eat, however pt refused anything except drinking juice.  Ended session in TIS, call bell within reach and all needs met.   Therapy Documentation Precautions:  Precautions Precautions: Fall Restrictions Weight Bearing Restrictions: No Vital Signs:  Pain: Pain Assessment Pain Score: 0-No pain Faces Pain Scale: No hurt  See Function Navigator for Current Functional Status.   Therapy/Group: Individual Therapy  Nieve Rojero K Arnette 09/13/2017, 12:43 PM

## 2017-09-13 NOTE — Progress Notes (Addendum)
Patient was noted trying to get out of bed multiple times on shift. She has a telemonitor who was heard intermittently telling patient to lay back down & to put her legs back into the bed. At the time, this nurse went to assist patient who was laying across the bed. At first patient would not allow assistance, stated that the nurse could not & wanted the nurse to leave the room because she did not need a nurse. Patient was also pulling at NG tube & would not let go for nurse to reposition it. She had taken off her gown & was laying halfway naked. Nurse tried to cover her up & get the NG tube, but patient was not letting go & did not want to be covered up. This nurse left to get assistance, came back to the room & saw patient sitting herself up. At this time, patient allowed the nurse to help her. Patient noted talking to people who were not there & having nonsensical speech. Was able to give her some of her medications & planned to come back later when patient was more calm.Patient was swatting at nurses had while nurse was trying to give insulin. No acute distress noted. Will continue to monitor

## 2017-09-14 ENCOUNTER — Inpatient Hospital Stay (HOSPITAL_COMMUNITY): Payer: Self-pay | Admitting: Occupational Therapy

## 2017-09-14 ENCOUNTER — Inpatient Hospital Stay (HOSPITAL_COMMUNITY): Payer: Self-pay | Admitting: Physical Therapy

## 2017-09-14 LAB — GLUCOSE, CAPILLARY
GLUCOSE-CAPILLARY: 125 mg/dL — AB (ref 65–99)
GLUCOSE-CAPILLARY: 120 mg/dL — AB (ref 65–99)
GLUCOSE-CAPILLARY: 183 mg/dL — AB (ref 65–99)
Glucose-Capillary: 130 mg/dL — ABNORMAL HIGH (ref 65–99)
Glucose-Capillary: 182 mg/dL — ABNORMAL HIGH (ref 65–99)
Glucose-Capillary: 185 mg/dL — ABNORMAL HIGH (ref 65–99)

## 2017-09-14 NOTE — Progress Notes (Signed)
Patient ID: Carla RightDorothy A Bauer, female   DOB: [birth year], 62 y.o.   MRN: [id number]   Carla Bauer is a 62 y.o. 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 No new problems. Slept well. Feeling OK.  Objective: Vital signs in last 24 hours: Temp:  [98.6 F (37 C)] 98.6 F (37 C) (06/15 0451) Pulse Rate:  [106-123] 107 (06/15 0838) Resp:  [18] 18 (06/15 0451) BP: (91-120)/(68-90) 104/90 (06/15 0838) SpO2:  [100 %] 100 % (06/15 0838) Weight:  [141 lb 12.1 oz (64.3 kg)] 141 lb 12.1 oz (64.3 kg) (06/15 0500) Weight change: -4 lb 13.6 oz (-2.2 kg) Last BM Date: 09/12/17  Intake/Output from previous day: 06/14 0701 - 06/15 0700 In: 640 [P.O.:240; NG/GT:400] Out: -  Last cbgs: CBG (last 3)  Recent Labs    09/14/17 0008 09/14/17 0454 09/14/17 0817  GLUCAP 182* 185* 130*   Patient Vitals for the past 24 hrs:  BP Temp Temp src Pulse Resp SpO2 Weight  09/14/17 0838 104/90 - - (!) 107 - 100 % -  09/14/17 0500 - - - - - - 141 lb 12.1 oz (64.3 kg)  09/14/17 0451 91/68 98.6 F (37 C) Oral (!) 106 18 100 % -  09/13/17 2316 120/85 - - (!) 123 18 100 % -    Physical Exam General: No apparent distress ; distracted HEENT: not dry nasogastric tube in place Lungs: Normal effort. Lungs clear to auscultation, no crackles or wheezes. Cardiovascular: Regular rate and rhythm, no edema; heart rate elevated approximately 100 Abdomen: S/NT/ND; BS(+) Musculoskeletal:  unchanged Neurological: No new neurological deficits; a phasic with right-sided weakness Wounds: N/A    Skin: clear  Aging changes Mental state: Alert, oriented, cooperative    Lab Results: BMET    Component Value Date/Time   NA 142 09/11/2017 1116   K 4.2 09/11/2017 1116   CL 102 09/11/2017 1116   CO2 32 09/11/2017 1116   GLUCOSE 182 (H) 09/11/2017 1116   BUN 19 09/11/2017 1116   CREATININE 0.66 09/11/2017  1116   CALCIUM 10.1 09/11/2017 1116   GFRNONAA >60 09/11/2017 1116   GFRAA >60 09/11/2017 1116   CBC    Component Value Date/Time   WBC 8.8 09/04/2017 0655   RBC 3.12 (L) 09/04/2017 0655   HGB 9.2 (L) 09/04/2017 0655   HCT 29.1 (L) 09/04/2017 0655   PLT 370 09/04/2017 0655   MCV 93.3 09/04/2017 0655   MCH 29.5 09/04/2017 0655   MCHC 31.6 09/04/2017 0655   RDW 15.7 (H) 09/04/2017 0655   LYMPHSABS 2.9 09/04/2017 0655   MONOABS 0.4 09/04/2017 0655   EOSABS 0.2 09/04/2017 0655   BASOSABS 0.0 09/04/2017 0655    Studies/Results: Ct Abdomen Wo Contrast  Result Date: 09/13/2017 CLINICAL DATA:  Dysphagia and assessment prior to possible percutaneous gastrostomy tube placement. EXAM: CT ABDOMEN WITHOUT CONTRAST TECHNIQUE: Multidetector CT imaging of the abdomen was performed following the standard protocol without IV contrast. COMPARISON:  None. FINDINGS: Lower chest: No acute abnormality. Hepatobiliary: No focal liver abnormality is seen. No gallstones, gallbladder wall thickening, or biliary dilatation. Pancreas: Unremarkable. No pancreatic ductal dilatation or surrounding inflammatory changes. Spleen: Normal in size without focal abnormality. Adrenals/Urinary Tract: Adrenal glands are unremarkable. Kidneys are normal, without renal calculi, focal lesion, or hydronephrosis. Stomach/Bowel: No hiatal hernia. Gastric anatomy appears normal with a feeding tube present extending into the  proximal duodenum. Gastric positioning is amenable to percutaneous gastrostomy with the stomach lying superior to the transverse colon. No evidence of bowel obstruction, ileus or free air. No abnormal fluid collections or ascites identified. Vascular/Lymphatic: No significant vascular findings are present. No enlarged abdominal or pelvic lymph nodes. Other: No hernias identified. Intra-abdominal segment a ventriculoperitoneal shunt catheter is identified which enters the peritoneal cavity through the right abdominal  wall with the catheter coursing in the right lower abdomen. Musculoskeletal: No acute or significant osseous findings. IMPRESSION: 1. Gastric anatomy appears amenable to percutaneous gastrostomy tube placement with no anatomic findings that would preclude gastrostomy or significantly increase risk. 2. No acute findings in the abdomen. 3. VP shunt catheter tubing in the right peritoneal cavity. Electronically Signed   By: Irish Lack M.D.   On: 09/13/2017 08:11    Medications: I have reviewed the patient's current medications.  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  Status post craniotomy and clipping of aneurysm.  Continue CIR  Nutrition/dysphagia.  Continue nasogastric tube feedings.  Will need PEG Hyperglycemia.  Continue insulin therapy and close monitoring Essential hypertension.  Blood pressure low normal and well controlled Sinus tachycardia.  Unchanged  Length of stay, days: 11  Gordy Savers , MD 09/14/2017, 10:55 AM

## 2017-09-14 NOTE — Progress Notes (Addendum)
Physical Therapy Session Note  Patient Details  Name: Carla Bauer MRN: [id number] Date of Birth: 1955/09/03  Today's Date: 09/14/2017 PT Individual Time: 6722-7737 AND 1515-1550 PT Individual Time Calculation (min): 45 min AND 35 min  Short Term Goals: Week 2:  PT Short Term Goal 1 (Week 2): Pt will perform bed<>w/c transfer with max assist +1 PT Short Term Goal 2 (Week 2): Pt will ambulate x 30 ft with max assist +1 and LRAD PT Short Term Goal 3 (Week 2): Pt will initiate w/c propulsion   Skilled Therapeutic Interventions/Progress Updates:   Session 1:  Pt in TIS and became agitated and verbally abuse towards this therapist. After a few minutes left alone to calm down, returned and pt more calm and agreeable to leave room. Taken in TIS to dance group in day room with other patients. Provided 1:1 therapy to pt w/ goal to increase engagement in activity and meaningful activities w/ peers. Engaging pt w/ skilled cues through therapy tech (only staff member she responds positively to), cues included gentle trunk rotation and UE/LE movements in time w/ music. Pt interacting w/ other patients minimally, but demonstrated that she was engaged w/ music and laughing along w/ other patients occasionally. Pt w/ increased agitation and continued refusal after ~45 minutes stating "take me back" and "I'm done". Returned to room and ended session in TIS, call bell within reach and all needs met. Quick release belt and chair alarm engaged.   Session 2:  Pt in supine and agreeable to therapy, no c/o pain. Therapy tech engaging w/ pt and therapist providing skilled cues for tech to give verbally to pt. Transferred to EOB w/ mod assist and to w/c w/ max assist x2 via squat pivot. Session continued to focus on building rapport w/ pt and functional activity tolerance. Ambulated 30' at rail w/ max encouragement from therapy tech and max assist from therapist. Max facilitation of lateral weight shifting, and total  assist for RLE management. Pt's daughter arrived at end of walk, which was also an encouragement to pt. Returned to room in [place] and provided skilled verbal cues to drink cup of nectar thick coffee safely, pt appreciative of coffee. Ended session in TIS and in care of daughter, all needs met. RN made aware of status.   Therapy Documentation Precautions:  Precautions Precautions: Fall Restrictions Weight Bearing Restrictions: No General: PT Amount of Missed Time (min): 15 Minutes PT Missed Treatment Reason: Patient unwilling to participate  See Function Navigator for Current Functional Status.   Therapy/Group: Individual Therapy  Wynona Duhamel K Arnette 09/14/2017, 1:46 PM

## 2017-09-14 NOTE — Progress Notes (Signed)
Occupational Therapy Session Note  Patient Details  Name: Carla Bauer MRN: [id number] Date of Birth: [birth year]  Today's Date: 09/14/2017 OT Individual Time: 0915-1000 OT Individual Time Calculation (min): 45 min   Short Term Goals: Week 2:  OT Short Term Goal 1 (Week 2): Pt will sit EOB with no more than mod A while participating in BADL task OT Short Term Goal 2 (Week 2): Pt will locate 1/4 grooming items at the sink with mod instructional cues OT Short Term Goal 3 (Week 2): Pt will complete toilet transfer with max +2 assist  Skilled Therapeutic Interventions/Progress Updates:    Pt greeted semi-reclined in bed, with legs creeping off the bed to the R. Rehab tech present to provide +2 and help direct care as pt responds best to other African Americans. Pt came to sitting EOB with max and increased time to initiate. Noted to be incontinent of bowel and bladder with no awareness. Sit<>stands in [place] with min A, then Mod A for balance 2/2 lateral lean to the R while +2 provided total A to wash buttocks and change brief. Pt transferred over to TIS wc in [place] and worked on [place] dressing. OT placed R LE into figure 4 position, then placed pant leg over R foot and pt able to initiate pulling up. Pt did try to initiate placing LLE into pants but was unable to coordinate lifting LE and reaching forward without assistance. Sit<>stand without [place] with mod A while +2 pulled pants over hips. Pt brought to the sink in wc and worked on R attention, R NMR, and initiation within bathing tasks. OT provided hand over hand A to use R UE for neuro re-ed with increased flexor tone noted. Had pt hold doedorant with R hand which she could do 2/2 tone, then pt needed max multimodal cues to place lid on deodorant at midline. UB dressing with no awareness of R side despite max multimodal cues. Pt became agitated at therapist trying to don safety belt, but was able to get on with assistance of rehab tech. Pt  increasingly agitated, swatting at therapist and stating "You ain't nothing but a ho." Therapist left pt with safety belt, chair alarm, 1/2 lap tray, and tilted in wc.   Therapy Documentation Precautions:  Precautions Precautions: Fall Restrictions Weight Bearing Restrictions: No Pain:  none/denies pain ADL: ADL ADL Comments: Please see functional navigator  See Function Navigator for Current Functional Status.   Therapy/Group: Individual Therapy  [name] 09/14/2017, 10:05 AM

## 2017-09-14 NOTE — Plan of Care (Signed)
  Problem: Consults Goal: RH STROKE PATIENT EDUCATION Description See Patient Education module for education specifics  Outcome: Progressing  Educated daughter on changes on meds and future date for procedure

## 2017-09-15 ENCOUNTER — Inpatient Hospital Stay (HOSPITAL_COMMUNITY): Payer: Self-pay

## 2017-09-15 LAB — GLUCOSE, CAPILLARY
GLUCOSE-CAPILLARY: 73 mg/dL (ref 65–99)
GLUCOSE-CAPILLARY: 98 mg/dL (ref 65–99)
Glucose-Capillary: 136 mg/dL — ABNORMAL HIGH (ref 65–99)
Glucose-Capillary: 143 mg/dL — ABNORMAL HIGH (ref 65–99)
Glucose-Capillary: 188 mg/dL — ABNORMAL HIGH (ref 65–99)
Glucose-Capillary: 197 mg/dL — ABNORMAL HIGH (ref 65–99)

## 2017-09-15 MED ORDER — CEFAZOLIN SODIUM-DEXTROSE 2-4 GM/100ML-% IV SOLN
2.0000 g | INTRAVENOUS | Status: AC
  Administered 2017-09-16: 2 g via INTRAVENOUS

## 2017-09-15 NOTE — Progress Notes (Signed)
Patient ID: Carla RightDorothy A Robbins, female   DOB: [birth year], 62 y.o.   MRN: [id number]   Carla Bauer is a 62 y.o. 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 No new problems.  More conversant today  Objective: Vital signs in last 24 hours: Temp:  [98.6 F (37 C)-98.8 F (37.1 C)] 98.6 F (37 C) (06/16 0424) Pulse Rate:  [103-113] 110 (06/16 0759) Resp:  [17-18] 17 (06/16 0424) BP: (103-133)/(66-108) 103/66 (06/16 0759) SpO2:  [81 %-100 %] 100 % (06/16 0424) Weight:  [144 lb 6.4 oz (65.5 kg)] 144 lb 6.4 oz (65.5 kg) (06/16 0429) Weight change: 2 lb 10.3 oz (1.2 kg) Last BM Date: 09/14/17  Intake/Output from previous day: 06/15 0701 - 06/16 0700 In: 360 [P.O.:360] Out: -  Last cbgs: CBG (last 3)  Recent Labs    09/15/17 0020 09/15/17 0415 09/15/17 0903  GLUCAP 188* 197* 136*   Patient Vitals for the past 24 hrs:  BP Temp Temp src Pulse Resp SpO2 Weight  09/15/17 0759 103/66 - - (!) 110 - - -  09/15/17 0429 - - - - - - 144 lb 6.4 oz (65.5 kg)  09/15/17 0424 107/78 98.6 F (37 C) Axillary (!) 112 17 100 % -  09/14/17 1959 - - - (!) 111 - 100 % -  09/14/17 1958 133/78 98.8 F (37.1 C) Oral (!) 113 18 (!) 81 % -  09/14/17 1413 (!) 126/108 98.6 F (37 C) Oral (!) 103 18 100 % -     Physical Exam General: No apparent distress   HEENT: Nasogastric tube in place Lungs: Normal effort. Lungs clear to auscultation, no crackles or wheezes. Cardiovascular: Regular rate and rhythm, no edema; unchanged resting tachycardia with heart rate 110 Abdomen: S/NT/ND; BS(+) Musculoskeletal:  unchanged Neurological: No new neurological deficits with right-sided weakness Wounds: N/A    Skin: clear   Mental state: Alert, oriented, cooperative    Lab Results: BMET    Component  Value Date/Time   NA 142 09/11/2017 1116   K 4.2 09/11/2017 1116   CL 102 09/11/2017 1116   CO2 32 09/11/2017 1116   GLUCOSE 182 (H) 09/11/2017 1116   BUN 19 09/11/2017 1116   CREATININE 0.66 09/11/2017 1116   CALCIUM 10.1 09/11/2017 1116   GFRNONAA >60 09/11/2017 1116   GFRAA >60 09/11/2017 1116   CBC    Component Value Date/Time   WBC 8.8 09/04/2017 0655   RBC 3.12 (L) 09/04/2017 0655   HGB 9.2 (L) 09/04/2017 0655   HCT 29.1 (L) 09/04/2017 0655   PLT 370 09/04/2017 0655   MCV 93.3 09/04/2017 0655   MCH 29.5 09/04/2017 0655   MCHC 31.6 09/04/2017 0655   RDW 15.7 (H) 09/04/2017 0655   LYMPHSABS 2.9 09/04/2017 0655   MONOABS 0.4 09/04/2017 0655   EOSABS 0.2 09/04/2017 0655   BASOSABS 0.0 09/04/2017 0655    Medications: I have reviewed the patient's current medications.  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  Continue CIR Nutrition.  Continue NGT feedings.  Likely will need PEG  Essential hypertension.  Blood pressure a bit labile.  Last 2 readings of low normal Mild sinus tachycardia.  Multifactorial unchanged    Length of stay, days: 12  Gordy SaversPeter F Halen Mossbarger , MD 09/15/2017, 10:10  AM    

## 2017-09-15 NOTE — Progress Notes (Signed)
Occupational Therapy Note  Patient Details  Name: Arvid RightDorothy A Staniszewski MRN: [id number] Date of Birth: 27-Mar-1956  Today's Date: 09/15/2017 OT Missed Time: 30 Minutes Missed Time Reason: Patient unwilling/refused to participate without medical reason  Upon entering room, pt seated in TIS. Offered options to assist pt to toilet, groom after eating, as well as prune dead petals off of bouquet of flowers on R. Pt declined all activities stating, "you can walk on up outta here." Offered to assist pt back to bed as pt had been sitting in TIS all morning, Pt declines. OT educated on importance of participating in tx to improve function to go home pt states, "I wont do any therapy." Exited with pt seated in TIS, tilted back and QRB donned.    Elenore PaddyStephanie M Blakely Maranan 09/15/2017, 1:10 PM

## 2017-09-15 NOTE — Progress Notes (Signed)
Physical Therapy Session Note  Patient Details  Name: Carla Bauer MRN: [id number] Date of Birth: 03-Mar-1956  Today's Date: 09/15/2017 PT Individual Time: 0800-0854 PT Individual Time Calculation (min): 54 min   Short Term Goals: Week 2:  PT Short Term Goal 1 (Week 2): Pt will perform bed<>w/c transfer with max assist +1 PT Short Term Goal 2 (Week 2): Pt will ambulate x 30 ft with max assist +1 and LRAD PT Short Term Goal 3 (Week 2): Pt will initiate w/c propulsion   Skilled Therapeutic Interventions/Progress Updates:    Pt supine in bed upon PT arrival, agreeable to therapy tx and no evidence of pain. Therapist asking if pt needs to use the bathroom, pt responds "she could go again." Pt transferred from supine>sitting EOB with mod assist for R UE/LE management. Pt performed scooting forward towards EOB with mod assist, sit<>stand within stedy with mod assist. Pt following therapist commands this session however often responding with rude comments throughout. Pt transferred to toilet total assist using the stedy. Pt performed sit<>stands with mod assist, total assist for clothing management. Pt incontinent of bladder, does not void again once seated on toilet. Pt given warm wash cloth, pt states "don't rush me, I can do this by myself. Don't watch me." Therapist provides distant supervision while pt washes face and L side of upper body while seated on toilet, pt refuses assist to clean the rest of her body. Pt performs sit<>stand from toilet with mod assist, total assist to clean peri area and don clean briefs. Pt dons gown with assist to loop R UE. Pt transferred to TIS w/c total assist in stedy. Pt transported throughout unit, total assist in TIS. Pt taken to the gym and reports "I not doing nothin' in here." Therapist engaging pt in conversation, asking what she would like to do, pt replies "you child, don't talk back to me." Therapist re-directs pt and performed manual stretching/ROM to pts R  UE and R LE including finger extension, wrist/elbow extension, shoulder flexion, hamstring stretch, heel cord stretch. Pt requesting warm water to drink, therapist provides pt with nectar thick warm water. Pt tracks to her right side and works on R attention to find therapist and cup. Pt takes the cup and then states "I'm no fool, this isn't warm water." Pt continues to call therapist a liar and begins to get agitated. Therapist takes pt back to room, pt's daughter arrived. Pt left seated in TIS w/c in care of daughter.    Therapy Documentation Precautions:  Precautions Precautions: Fall Restrictions Weight Bearing Restrictions: No   See Function Navigator for Current Functional Status.   Therapy/Group: Individual Therapy  Cresenciano GenreEmily van Schagen, PT, DPT  09/15/2017, 7:39 AM

## 2017-09-15 NOTE — Progress Notes (Signed)
Patient would not allow Heparin injection, will attempt again later

## 2017-09-15 NOTE — Progress Notes (Signed)
Occupational Therapy Session Note  Patient Details  Name: Carla Bauer MRN: [id number] Date of Birth: Jan 26, 1956  Today's Date: 09/15/2017 OT Individual Time: 1030-1110 OT Individual Time Calculation (min): 40 min  and Today's Date: 09/15/2017 OT Missed Time: 20 Minutes Missed Time Reason: Patient unwilling/refused to participate without medical reason   Short Term Goals: Week 2:  OT Short Term Goal 1 (Week 2): Pt will sit EOB with no more than mod A while participating in BADL task OT Short Term Goal 2 (Week 2): Pt will locate 1/4 grooming items at the sink with mod instructional cues OT Short Term Goal 3 (Week 2): Pt will complete toilet transfer with max +2 assist  Skilled Therapeutic Interventions/Progress Updates:    1;1. Pt and daughter present this date. Pt seated in TIS. Daughter dressed pt this morning prior to OT and verbalizing she will leave while pt in OT. Pt immediately becomes verbally abusive to OT and daughter, swearing and telling OT, "Do not touch me" As OT repositions hips in chair to keep pt from falling. OT tilts pt back in chair and walks with daughter to hospital entrance for visual scanning for daughter on R to encourage pt to look to R visual field with no success. (pt just keeps rotating trunk L). Pt more calm on way back to the room and OT completes PROM while NT engages pt conversation about family/orientation to self with cueing from OT on what questions to ask. Pt responding better to NT than OT. As OT attempting to leave, pt requests hot coffee. OT prepares nectar thick coffee, however despite cueing to let go of NG tube to grasp cup to refuses and not safe to give pt cup of hot coffee to spill on self. OT exits room with coffee on tray table on R out of reach, QRB donned, pt tilted back and call light on L lap of pt. Pt missed 20 min skilled OT d/t refusal to participate.  Therapy Documentation Precautions:  Precautions Precautions: Fall Restrictions Weight  Bearing Restrictions: No General:  See Function Navigator for Current Functional Status.   Therapy/Group: Individual Quintella Reichertherapy\  Tiffancy Moger M Idamay Hosein 09/15/2017, 11:16 AM

## 2017-09-16 ENCOUNTER — Inpatient Hospital Stay (HOSPITAL_COMMUNITY): Payer: Self-pay | Admitting: Occupational Therapy

## 2017-09-16 ENCOUNTER — Inpatient Hospital Stay (HOSPITAL_COMMUNITY): Payer: Self-pay

## 2017-09-16 ENCOUNTER — Inpatient Hospital Stay (HOSPITAL_COMMUNITY): Payer: Medicaid Other

## 2017-09-16 ENCOUNTER — Inpatient Hospital Stay (HOSPITAL_COMMUNITY): Payer: Medicaid Other | Admitting: Speech Pathology

## 2017-09-16 HISTORY — PX: IR GASTROSTOMY TUBE MOD SED: IMG625

## 2017-09-16 LAB — GLUCOSE, CAPILLARY
GLUCOSE-CAPILLARY: 121 mg/dL — AB (ref 65–99)
GLUCOSE-CAPILLARY: 131 mg/dL — AB (ref 65–99)
GLUCOSE-CAPILLARY: 149 mg/dL — AB (ref 65–99)
GLUCOSE-CAPILLARY: 165 mg/dL — AB (ref 65–99)
GLUCOSE-CAPILLARY: 184 mg/dL — AB (ref 65–99)
GLUCOSE-CAPILLARY: 64 mg/dL — AB (ref 65–99)
Glucose-Capillary: 120 mg/dL — ABNORMAL HIGH (ref 65–99)
Glucose-Capillary: 146 mg/dL — ABNORMAL HIGH (ref 65–99)
Glucose-Capillary: 59 mg/dL — ABNORMAL LOW (ref 65–99)
Glucose-Capillary: 64 mg/dL — ABNORMAL LOW (ref 65–99)

## 2017-09-16 LAB — PROTIME-INR
INR: 1.01
PROTHROMBIN TIME: 13.2 s (ref 11.4–15.2)

## 2017-09-16 MED ORDER — DEXTROSE 50 % IV SOLN
INTRAVENOUS | Status: AC
  Administered 2017-09-16: 25 mL
  Filled 2017-09-16: qty 50

## 2017-09-16 MED ORDER — DEXTROSE 50 % IV SOLN
INTRAVENOUS | Status: AC
  Administered 2017-09-16: 20:00:00
  Filled 2017-09-16: qty 50

## 2017-09-16 MED ORDER — DEXTROSE 50 % IV SOLN
INTRAVENOUS | Status: AC
  Administered 2017-09-16: 50 mL
  Filled 2017-09-16: qty 50

## 2017-09-16 MED ORDER — DEXTROSE 50 % IV SOLN
25.0000 mL | Freq: Once | INTRAVENOUS | Status: AC
  Administered 2017-09-16: 25 mL via INTRAVENOUS

## 2017-09-16 MED ORDER — CEFAZOLIN SODIUM-DEXTROSE 2-4 GM/100ML-% IV SOLN
INTRAVENOUS | Status: AC
Start: 2017-09-16 — End: 2017-09-17
  Filled 2017-09-16: qty 100

## 2017-09-16 MED ORDER — IOPAMIDOL (ISOVUE-300) INJECTION 61%
INTRAVENOUS | Status: AC
  Filled 2017-09-16: qty 50

## 2017-09-16 MED ORDER — FENTANYL CITRATE (PF) 100 MCG/2ML IJ SOLN
INTRAMUSCULAR | Status: AC | PRN
  Administered 2017-09-16 (×2): 25 ug via INTRAVENOUS

## 2017-09-16 MED ORDER — LIDOCAINE HCL (PF) 1 % IJ SOLN
INTRAMUSCULAR | Status: AC | PRN
  Administered 2017-09-16: 2 mL

## 2017-09-16 MED ORDER — FENTANYL CITRATE (PF) 100 MCG/2ML IJ SOLN
INTRAMUSCULAR | Status: AC
  Filled 2017-09-16: qty 2

## 2017-09-16 MED ORDER — MIDAZOLAM HCL 2 MG/2ML IJ SOLN
INTRAMUSCULAR | Status: AC | PRN
  Administered 2017-09-16: 0.5 mg via INTRAVENOUS
  Administered 2017-09-16: 1 mg via INTRAVENOUS
  Administered 2017-09-16: 0.5 mg via INTRAVENOUS

## 2017-09-16 MED ORDER — LIDOCAINE HCL 1 % IJ SOLN
INTRAMUSCULAR | Status: AC
  Filled 2017-09-16: qty 20

## 2017-09-16 MED ORDER — GLUCAGON HCL RDNA (DIAGNOSTIC) 1 MG IJ SOLR
INTRAMUSCULAR | Status: AC
  Administered 2017-09-16: 1 mg
  Filled 2017-09-16: qty 1

## 2017-09-16 MED ORDER — MIDAZOLAM HCL 2 MG/2ML IJ SOLN
INTRAMUSCULAR | Status: AC
  Filled 2017-09-16: qty 2

## 2017-09-16 NOTE — Progress Notes (Signed)
Speech Language Pathology Daily Session Note  Patient Details  Name: Carla Bauer MRN: [id number] Date of Birth: September 19, 1955  Today's Date: 09/16/2017 SLP Individual Time: 1415-1430 SLP Individual Time Calculation (min): 15 min and Today's Date: 09/16/2017 SLP Missed Time: 30 Minutes Missed Time Reason: Patient unwilling to participate  Short Term Goals: Week 2: SLP Short Term Goal 1 (Week 2): Pt will verbally respond to basic, personally, relevant questions in 25% of opportunities with max assist multimodal cues.  SLP Short Term Goal 2 (Week 2): Pt will follow 1 step commands during basic, structured tasks for 75% accuracy with max assist multimodal cues.  SLP Short Term Goal 3 (Week 2): Pt will focus her attention to a targeted stimulus in 75% of opportunities with max assist multimodal cues.   SLP Short Term Goal 4 (Week 2): Pt will locate items at midline in 50% of opportunities during basic, structured tasks with max assist multimodal cues.  SLP Short Term Goal 5 (Week 2): Pt will consume dys 1 textures and nectar thick liquids with max assist multimodal cues for use of swallowing precautions and no overt s/s of aspiration.   Skilled Therapeutic Interventions: Skilled treatment session focused on cognitive goals. Upon arrival, patient was awake while supine in bed. SLP facilitated session by providing total A for patient to engage in a therapeutic task that focused on cognitive-linguistic goals. Patient declined despite multiple attempts due to reports of feeling weak and hungry (NPO since midnight for PEG procedure). Therefore, patient declined remaining 30 minutes of session. Patient left upright in bed with all needs within reach and alarm on. Continue with current plan of care.      Function:  Cognition Comprehension Comprehension assist level: Understands basic 25 - 49% of the time/ requires cueing 50 - 75% of the time  Expression   Expression assist level: Expresses basis less  than 25% of the time/requires cueing >75% of the time.  Social Interaction Social Interaction assist level: Interacts appropriately less than 25% of the time. May be withdrawn or combative.  Problem Solving Problem solving assist level: Solves basic less than 25% of the time - needs direction nearly all the time or does not effectively solve problems and may need a restraint for safety  Memory Memory assist level: Recognizes or recalls less than 25% of the time/requires cueing greater than 75% of the time    Pain Pain Assessment Pain Score: 0-No pain  Therapy/Group: Individual Therapy  Merdith Boyd 09/16/2017, 3:18 PM

## 2017-09-16 NOTE — Progress Notes (Signed)
Hypoglycemic Event  CBG: 64  Treatment: 25ml of D50  Symptoms: none  Follow-up CBG: Time0500 CBG Result131  Possible Reasons for Event: unknown, she is NPO for procedure  Comments/MD notified:Dan Anguilli PA    Carla CourseKeesha L Nawaf Bauer

## 2017-09-16 NOTE — Significant Event (Signed)
Hypoglycemic Event  CBG: 64  Treatment: D50 IV 25 mL  Symptoms: None  Follow-up CBG: Time:1204 CBG Result:121  Possible Reasons for Event: Other: Patient is NPO for Peg placement today.  Comments/MD notified:[name], PA-C notified and ordered D50 IV 25mL. D50 given and blood sugar rechecked and patient in no distress.    Carla BeardsJENNINGS, Suzi RootsSTACEY G

## 2017-09-16 NOTE — Procedures (Signed)
20 Fr pull through g tube EBL 0 Comp 0

## 2017-09-16 NOTE — Progress Notes (Signed)
Patient transported to Interventional Radiology for G-tube placement. Patient resting comfortably in bed with no complaints of pain at the time of transport.

## 2017-09-16 NOTE — Progress Notes (Signed)
Occupational Therapy Session Note  Patient Details  Name: Carla Bauer MRN: [id number] Date of Birth: [birth year]  Today's Date: 09/16/2017 OT Individual Time: 0805-0900 OT Individual Time Calculation (min): 55 min    Short Term Goals: Week 2:  OT Short Term Goal 1 (Week 2): Pt will sit EOB with no more than mod A while participating in BADL task OT Short Term Goal 2 (Week 2): Pt will locate 1/4 grooming items at the sink with mod instructional cues OT Short Term Goal 3 (Week 2): Pt will complete toilet transfer with max +2 assist  Skilled Therapeutic Interventions/Progress Updates:    Pt had a great therapy session today. She was alert, participatory, and pleasant throughout entire session. Checked pt's brief in bed and she was clean. Pt followed commands to come to sitting EOB with mod A to advance LEs and max A to elevate trunk. Pt donned shirt sitting EOB with increased attention to R UE with multimodal cues to try to pull shirt sleeve over R arm. Pt even stated "am I doing this right?" Pt then completed sit<>stand in Stedy with min A and transferred over to wc. Worked on LB dressing strategies using backwards chaining technique to get pt to initiate part of task. Pt needed mod A for siting balance when reaching forward to thread LLE into pants. Sit<>stand without stedy with mod A and mod/max A for dynamic balance when pt pulling up pants. OT provided gentle massage and PROM to R UE 2/2 flexor tone. Utilized dynavision for work on Horticulturist, commercial and attention. Pt allowed therapist to provide hand over hand A to increase accuracy to push buttons and to get pt to look past midline. Pt with poor attention to task and couldn't push 2 buttons in a row. Pt returned to room at end of session and left seated in TIS wc with safety belt on and needs met.   Therapy Documentation Precautions:  Precautions Precautions: Fall Restrictions Weight Bearing Restrictions: No Pain:  none/denies  pain ADL: ADL ADL Comments: Please see functional navigator  See Function Navigator for Current Functional Status.   Therapy/Group: Individual Therapy  Valma Cava 09/16/2017, 9:02 AM

## 2017-09-16 NOTE — Sedation Documentation (Signed)
CM: ST

## 2017-09-16 NOTE — Progress Notes (Signed)
Physical Therapy Session Note  Patient Details  Name: Carla RightDorothy A Wieseler MRN: [id number] Date of Birth: 03-Jul-1955  Today's Date: 09/16/2017 PT Individual Time: 1100-1130 PT Individual Time Calculation (min): 30 min  and Today's Date: 09/16/2017 PT Missed Time: 30 Minutes Missed Time Reason: Patient unwilling to participate  Short Term Goals: Week 2:  PT Short Term Goal 1 (Week 2): Pt will perform bed<>w/c transfer with max assist +1 PT Short Term Goal 2 (Week 2): Pt will ambulate x 30 ft with max assist +1 and LRAD PT Short Term Goal 3 (Week 2): Pt will initiate w/c propulsion   Skilled Therapeutic Interventions/Progress Updates:    Pt seated in TIS w/c upon PT arrival, pt states she is bored however is resistant to therapy tx. Pt asking for coffee this session however she is NPO, therefore pt becoming very upset that she hasn't had her coffee. Pt transported to the gym in attempts to redirect pt. Pt refuses to work on ambulation this session but agreeable to stand at the rail, pt performs sit<>stand at rail with mod assist, once in standing pt working on maintaining static standing balance without UE support on rail x 5-10 sec with tactile cues for R quad activation. Pt sits back down and states "today is not the day, I'm not doing nothing." Therapist and tech redirecting pt, pt performs x 2 active R knee extension with tactile cues for activation. Pt refuses further participation in the gym, transported back to room. Pt states she wants to stay in w/c, therapist able to convince pt to practice stand pivot transfer from w/c<>bed. Pt performs stand pivot from w/c<>bed in each direction with mod assist +2 for safety. Pt states she is done, left seated in TIS w/c with needs in reach, QRB in place and chair alarm set.   Therapy Documentation Precautions:  Precautions Precautions: Fall Restrictions Weight Bearing Restrictions: No   See Function Navigator for Current Functional  Status.   Therapy/Group: Individual Therapy  Cresenciano GenreEmily van Schagen, PT, DPT 09/16/2017, 7:54 AM

## 2017-09-16 NOTE — Progress Notes (Signed)
Patient refused labs this morning.

## 2017-09-16 NOTE — Sedation Documentation (Signed)
+

## 2017-09-16 NOTE — Progress Notes (Signed)
Speech Language Pathology Daily Session Note  Patient Details  Name: Carla Bauer MRN: [id number] Date of Birth: 1955/11/23  Today's Date: 09/16/2017 SLP Individual Time: 1300-1345 SLP Individual Time Calculation (min): 45 min  Short Term Goals: Week 2: SLP Short Term Goal 1 (Week 2): Pt will verbally respond to basic, personally, relevant questions in 25% of opportunities with max assist multimodal cues.  SLP Short Term Goal 2 (Week 2): Pt will follow 1 step commands during basic, structured tasks for 75% accuracy with max assist multimodal cues.  SLP Short Term Goal 3 (Week 2): Pt will focus her attention to a targeted stimulus in 75% of opportunities with max assist multimodal cues.   SLP Short Term Goal 4 (Week 2): Pt will locate items at midline in 50% of opportunities during basic, structured tasks with max assist multimodal cues.  SLP Short Term Goal 5 (Week 2): Pt will consume dys 1 textures and nectar thick liquids with max assist multimodal cues for use of swallowing precautions and no overt s/s of aspiration.   Skilled Therapeutic Interventions: Skilled ST services focused on cognitive skills. SLP facilitated orientation to place, situation and time required total-max A verbal/visual cues. SLP explained upcoming PEG placement, however pt denied proceure despite given max A multimodal cues. Pt perseverated on needing "warm cup of coffee", however was eventually redirected with functional tasks. Pt demonstrated increase in sustained attention up to 2 minutes with max-mod A verbal cues and verbal output with response to all presented questions in 100% of opportunities, however continues to demonstarte language of confusion. SLP facilitated following 1 step basic body movement directions, pt required max-mod A verbal/visual cues. SLP facilitated verbal output and expressive naming utilizing verbal picture cards and items in [facility] tool[facility], pt required max-mod A multimodal cues. SLP  facilitated identification of common items in [facility], pt required mod A verbal cues when given two items in pt's visual field. Pt was left in room with call bell within reach and bed alarm set. ST recommends to continue skilled ST services.     Function:  Eating Eating                 Cognition Comprehension Comprehension assist level: Understands basic 25 - 49% of the time/ requires cueing 50 - 75% of the time  Expression   Expression assist level: Expresses basis less than 25% of the time/requires cueing >75% of the time.  Social Interaction Social Interaction assist level: Interacts appropriately less than 25% of the time. May be withdrawn or combative.  Problem Solving Problem solving assist level: Solves basic less than 25% of the time - needs direction nearly all the time or does not effectively solve problems and may need a restraint for safety  Memory Memory assist level: Recognizes or recalls less than 25% of the time/requires cueing greater than 75% of the time    Pain Pain Assessment Pain Score: 0-No pain  Therapy/Group: Individual Therapy  Markeia Harkless  [facility] 09/16/2017, 1:47 PM

## 2017-09-16 NOTE — Progress Notes (Addendum)
Subjective/Complaints: Patient states that if I do not get her a cup of coffee she will not let me examine her. Patient oriented to person  ROS: Limited due to cognitive/behavioral   Objective: Vital Signs: Blood pressure 90/74, pulse 97, temperature 97.8 F (36.6 C), temperature source Oral, resp. rate 17, height 5\' 1"  (1.549 m), weight 65.1 kg (143 lb 8.3 oz), SpO2 (!) 83 %. No results found. Results for orders placed or performed during the hospital encounter of 09/03/17 (from the past 72 hour(s))  Glucose, capillary     Status: None   Collection Time: 09/13/17  3:46 PM  Result Value Ref Range   Glucose-Capillary 97 65 - 99 mg/dL  Glucose, capillary     Status: Abnormal   Collection Time: 09/13/17  8:42 PM  Result Value Ref Range   Glucose-Capillary 201 (H) 65 - 99 mg/dL  Glucose, capillary     Status: Abnormal   Collection Time: 09/14/17 12:08 AM  Result Value Ref Range   Glucose-Capillary 182 (H) 65 - 99 mg/dL  Glucose, capillary     Status: Abnormal   Collection Time: 09/14/17  4:54 AM  Result Value Ref Range   Glucose-Capillary 185 (H) 65 - 99 mg/dL  Glucose, capillary     Status: Abnormal   Collection Time: 09/14/17  8:17 AM  Result Value Ref Range   Glucose-Capillary 130 (H) 65 - 99 mg/dL  Glucose, capillary     Status: Abnormal   Collection Time: 09/14/17 11:39 AM  Result Value Ref Range   Glucose-Capillary 125 (H) 65 - 99 mg/dL  Glucose, capillary     Status: Abnormal   Collection Time: 09/14/17  4:18 PM  Result Value Ref Range   Glucose-Capillary 120 (H) 65 - 99 mg/dL  Glucose, capillary     Status: Abnormal   Collection Time: 09/14/17  7:55 PM  Result Value Ref Range   Glucose-Capillary 183 (H) 65 - 99 mg/dL  Glucose, capillary     Status: Abnormal   Collection Time: 09/15/17 12:20 AM  Result Value Ref Range   Glucose-Capillary 188 (H) 65 - 99 mg/dL  Glucose, capillary     Status: Abnormal   Collection Time: 09/15/17  4:15 AM  Result Value Ref Range    Glucose-Capillary 197 (H) 65 - 99 mg/dL  Glucose, capillary     Status: Abnormal   Collection Time: 09/15/17  9:03 AM  Result Value Ref Range   Glucose-Capillary 136 (H) 65 - 99 mg/dL  Glucose, capillary     Status: None   Collection Time: 09/15/17 11:54 AM  Result Value Ref Range   Glucose-Capillary 73 65 - 99 mg/dL  Glucose, capillary     Status: None   Collection Time: 09/15/17  4:38 PM  Result Value Ref Range   Glucose-Capillary 98 65 - 99 mg/dL  Glucose, capillary     Status: Abnormal   Collection Time: 09/15/17  7:57 PM  Result Value Ref Range   Glucose-Capillary 143 (H) 65 - 99 mg/dL   Comment 1 Notify RN   Glucose, capillary     Status: Abnormal   Collection Time: 09/16/17 12:01 AM  Result Value Ref Range   Glucose-Capillary 165 (H) 65 - 99 mg/dL  Glucose, capillary     Status: Abnormal   Collection Time: 09/16/17  4:35 AM  Result Value Ref Range   Glucose-Capillary 64 (L) 65 - 99 mg/dL  Glucose, capillary     Status: Abnormal   Collection Time: 09/16/17  5:06  AM  Result Value Ref Range   Glucose-Capillary 131 (H) 65 - 99 mg/dL  Protime-INR     Status: None   Collection Time: 09/16/17 10:02 AM  Result Value Ref Range   Prothrombin Time 13.2 11.4 - 15.2 seconds   INR 1.01     Comment: Performed at [facility], [address], [place], [place] [address]  Glucose, capillary     Status: Abnormal   Collection Time: 09/16/17 11:36 AM  Result Value Ref Range   Glucose-Capillary 64 (L) 65 - 99 mg/dL   Comment 1 Notify RN   Glucose, capillary     Status: Abnormal   Collection Time: 09/16/17 12:04 PM  Result Value Ref Range   Glucose-Capillary 121 (H) 65 - 99 mg/dL     Constitutional: No distress . Vital signs reviewed. HEENT: EOMI, oral membranes moist Extremity:  No Edema Skin:   Wound well healed incision Left crani, staples in Ventriculostomy placement site Neuro:confused, distracted.  Abnormal   Abnormal Motor 0/5 RUE and RLE, at least 4/5 on Left side,  Abnormal FMC Ataxic/ dec FMC, Dysarthric and Aphasic  Gen NAD   Assessment/Plan: 1. 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 Physiatrist is providing close team supervision and 24 hour management of active medical problems listed below. Physiatrist and rehab team continue to assess barriers to discharge/monitor patient progress toward functional and medical goals. FIM: Function - Bathing Position: Wheelchair/chair at sink Body parts bathed by patient: Abdomen, Chest, Right arm, Front perineal area Body parts bathed by helper: Left lower leg, Right lower leg, Left upper leg, Right upper leg, Buttocks, Left arm Assist Level: 2 helpers  Function- Upper Body Dressing/Undressing What is the patient wearing?: Pull over shirt/dress Pull over shirt/dress - Perfomed by patient: Thread/unthread left sleeve, Put head through opening Pull over shirt/dress - Perfomed by helper: Pull shirt over trunk, Thread/unthread right sleeve Assist Level: Touching or steadying assistance(Pt > 75%) Function - Lower Body Dressing/Undressing What is the patient wearing?: Pants Position: Wheelchair/chair at sink Pants- Performed by helper: Thread/unthread right pants leg, Thread/unthread left pants leg, Pull pants up/down Non-skid slipper socks- Performed by helper: Don/doff right sock, Don/doff left sock Assist for footwear: Dependant Assist for lower body dressing: 2 Helpers  Function - Toileting Toileting activity did not occur: No continent bowel/bladder event Toileting steps completed by helper: Adjust clothing prior to toileting, Performs perineal hygiene, Adjust clothing after toileting Assist level: Two helpers  Function - Archivist transfer activity did not occur: Safety/medical concerns Toilet transfer assistive device: Dentist lift: Stedy Assist level to toilet: 2 helpers Assist level from toilet: 2 helpers  Function - Chair/bed transfer Chair/bed transfer method: Stand pivot Chair/bed transfer assist level: 2 helpers Chair/bed transfer assistive device: Armrests, Bedrails Mechanical lift: Stedy Chair/bed transfer details: Manual facilitation for weight shifting, Manual facilitation for placement, Verbal cues for technique, Verbal cues for precautions/safety  Function - Locomotion: Wheelchair Will patient use wheelchair at discharge?: Yes Type: Manual Max wheelchair distance: 150 ft Assist Level: Dependent (Pt equals 0%) Assist Level: Dependent (Pt equals 0%) Assist Level: Dependent (Pt equals 0%) Turns around,maneuvers to table,bed, and toilet,negotiates 3% grade,maneuvers on rugs and over doorsills: No Function - Locomotion: Ambulation Ambulation activity did not occur: Safety/medical concerns Assistive device: Rail in hallway Max distance: 30' Assist level: 2 helpers Assist level: 2 helpers  Function - Comprehension Comprehension: Auditory Comprehension assist level: Understands basic 25 - 49% of the time/ requires cueing 50 - 75% of the time  Function - Expression Expression: Verbal Expression assist level: Expresses basis less than 25% of the time/requires cueing >75% of the time.  Function - Social Interaction Social Interaction assist level: Interacts appropriately less than 25% of the time. May be withdrawn or combative.  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 SLP-  2. DVT Prophylaxis/Anticoagulation: Subcutaneous heparin. LE vascular study negative 3. Pain Management:Tylenol as needed 4. Mood:Provide emotional support  -Add scheduled Seroquel at night-reduced dose secondary to sedation 12.5  -Add as needed Seroquel for agitation.  Want to avoid oversedation 5. Neuropsych: This patientis notcapable of making decisions on herown behalf. 6. Skin/Wound Care:Routine skin checks 7. Fluids/Electrolytes/Nutrition:Routine in and outs with follow-up chemistries -NGT feedings, adjust rate/H20 as needed- dietary consult Meal intake has been 0-10%, on modified diet but has many oral phase issues related to poor awareness of food bolus in mouth, awaiting PEG tube placement 8.Dysphagia. Dysphagia #1 nectar liquids. Nasogastric tube feeds for nutritional support, will need  PEG  Given  9.Hyperglycemia induced from tube feeds. Continue Lantus insulin for coverage. CBG (last 3)  Recent Labs    09/16/17 0506 09/16/17 1136 09/16/17 1204  GLUCAP 131* 64* 121*  Good range for hospital control 6/15, monitor for low values 10.Hypertension. Lopressor 50 mg twice daily Vitals:   09/15/17 2002 09/16/17 0616  BP: 131/85 90/74  Pulse: (!) 108 97  Resp: 17 17  Temp: 97.9 F (36.6 C) 97.8 F (36.6 C)  SpO2: 100% (!) 83%  BP is creeping up with mild sinus tachycardia- increase lopressor 11.  Anemia,Hgb 9.2 Baseline Hgb ?11.8, had some post op blood loss,  stool guaic pending, Normal Fe studies 6/10 12.  Agitation due to confusion , balancing control of agitation with sedation  13. Sinus tachy- likely multifactorial anemia, not hypoxic ,6/12 normal BMET Lopressor was  increased to 100 mg twice a day on 06/15/2017, continue to monitor affect LOS (Days) 13 A FACE TO FACE EVALUATION WAS PERFORMED  Erick Colace 09/16/2017, 12:58 PM

## 2017-09-17 ENCOUNTER — Inpatient Hospital Stay (HOSPITAL_COMMUNITY): Payer: Self-pay

## 2017-09-17 ENCOUNTER — Inpatient Hospital Stay (HOSPITAL_COMMUNITY): Payer: Self-pay | Admitting: Physical Therapy

## 2017-09-17 ENCOUNTER — Inpatient Hospital Stay (HOSPITAL_COMMUNITY): Payer: Medicaid Other | Admitting: Speech Pathology

## 2017-09-17 LAB — GLUCOSE, CAPILLARY
GLUCOSE-CAPILLARY: 105 mg/dL — AB (ref 65–99)
GLUCOSE-CAPILLARY: 106 mg/dL — AB (ref 65–99)
GLUCOSE-CAPILLARY: 165 mg/dL — AB (ref 65–99)
GLUCOSE-CAPILLARY: 108 mg/dL — AB (ref 65–99)
Glucose-Capillary: 164 mg/dL — ABNORMAL HIGH (ref 65–99)
Glucose-Capillary: 108 mg/dL — ABNORMAL HIGH (ref 65–99)

## 2017-09-17 MED ORDER — OSMOLITE 1.5 CAL PO LIQD
240.0000 mL | Freq: Four times a day (QID) | ORAL | Status: DC
  Filled 2017-09-17 (×4): qty 474

## 2017-09-17 MED ORDER — PRO-STAT SUGAR FREE PO LIQD
30.0000 mL | Freq: Three times a day (TID) | ORAL | Status: DC
Start: 2017-09-17 — End: 2017-09-27
  Administered 2017-09-17 – 2017-09-27 (×29): 30 mL
  Filled 2017-09-17 (×30): qty 30

## 2017-09-17 MED ORDER — INSULIN ASPART 100 UNIT/ML ~~LOC~~ SOLN
0.0000 [IU] | Freq: Three times a day (TID) | SUBCUTANEOUS | Status: DC
  Administered 2017-09-18: 2 [IU] via SUBCUTANEOUS
  Administered 2017-09-18: 5 [IU] via SUBCUTANEOUS
  Administered 2017-09-19: 2 [IU] via SUBCUTANEOUS
  Administered 2017-09-19: 5 [IU] via SUBCUTANEOUS
  Administered 2017-09-20: 1 [IU] via SUBCUTANEOUS
  Administered 2017-09-20 – 2017-09-21 (×2): 3 [IU] via SUBCUTANEOUS
  Administered 2017-09-22 – 2017-09-24 (×3): 1 [IU] via SUBCUTANEOUS
  Administered 2017-09-25: 3 [IU] via SUBCUTANEOUS
  Administered 2017-09-26: 1 [IU] via SUBCUTANEOUS
  Administered 2017-09-26: 3 [IU] via SUBCUTANEOUS

## 2017-09-17 MED ORDER — FREE WATER
400.0000 mL | Freq: Four times a day (QID) | Status: DC
  Administered 2017-09-17 – 2017-09-18 (×3): 400 mL

## 2017-09-17 MED ORDER — FREE WATER: 400.0000 mL | Freq: Four times a day (QID) | Status: DC

## 2017-09-17 MED ORDER — OSMOLITE 1.5 CAL PO LIQD
240.0000 mL | Freq: Four times a day (QID) | ORAL | Status: DC
  Administered 2017-09-17 – 2017-09-18 (×4): 240 mL
  Administered 2017-09-18: 237 mL
  Administered 2017-09-18: 240 mL
  Administered 2017-09-19: 237 mL
  Administered 2017-09-19 – 2017-09-27 (×26): 240 mL
  Filled 2017-09-17 (×48): qty 474

## 2017-09-17 NOTE — Progress Notes (Addendum)
Nutrition Follow-up   INTERVENTION:   Addendum:  Transition to bolus TF via PEG: - 240 ml Osmolite 1.5 QID at 0800, 1200, 1600, and 2000 - 30 ml Pro-stat TID - 200 ml free water flush before and after bolus TF. MD to adjust free water flushes as appropriate.  TF regimen provides 1740 kcal (100% of needs), 105 grams protein, and 2330 ml free water.  - Continue Magic Cup TID with meals, each supplement provides 290 kcal and 9 grams of protein   NUTRITION DIAGNOSIS:   Inadequate oral intake related to dysphagia as evidenced by meal completion < 25%.  Ongoing, being addressed via TF  GOAL:   Patient will meet greater than or equal to 90% of their needs  Met via TF  MONITOR:   PO intake, Supplement acceptance, Diet advancement, Weight trends, Labs, TF tolerance, Skin, I & O's  REASON FOR ASSESSMENT:   Consult Enteral/tube feeding initiation and management  ASSESSMENT:   62 year old female with history of headaches. Presented 08/14/2017 after being found down by her family with right sided weakness. Cranial CT scan showed left temporal hemorrhage. Underwent left craniotomy for clipping of aneurysm, evacuation of left temporal hematoma placement of right frontal external ventricular drain. Diet has been advanced to a dysphagia #1 nectar thick liquid as well as the nasogastric tube remains in place for nutritional support.   6/17 - s/p PEG tube placement, NG tube removed  Spoke with pt at bedside. Pt with unfinished meal tray at time of visit, approximately 25% meal completion. When asked about her food intake, pt states "it'll do." Pt somewhat confused at RD time of visit. RD discussed restarting pt's tube feedings at night via PEG.  Spoke with RN and NT. Per NT, RN to return to pt's room to feed her the rest of Magic Cup from lunch tray. Discussed restarting nocturnal tube feeds this evening with RN.  Addendum: RN paged RD to transition pt to bolus TF rather than nocturnal  TF. RD to change order.  Meal Completion: 5-20% since 6/16  Medications reviewed and include: 30 ml Pro-stat TID, 400 ml free water flushes QID, sliding scale Novolog, 20 units Lantus daily, Fibercon daily  Labs reviewed. CBG's: 165, 106, 105, 120, 184, 146, 59, 149, 121 x 24 hours   Diet Order:   Diet Order           DIET - DYS 1 Room service appropriate? Yes; Fluid consistency: Nectar Thick  Diet effective now          EDUCATION NEEDS:   Not appropriate for education at this time  Skin:  Skin Assessment: Skin Integrity Issues: Skin Integrity Issues:: Incisions Incisions: head  Last BM:  09/15/17 type 6  Height:   Ht Readings from Last 1 Encounters:  09/05/17 5' 1"  (1.549 m)    Weight:   Wt Readings from Last 1 Encounters:  09/17/17 143 lb 11.8 oz (65.2 kg)    Ideal Body Weight:  47.7 kg  BMI:  Body mass index is 27.16 kg/m.  Estimated Nutritional Needs:   Kcal:  1650-1850  Protein:  80-100 grams  Fluid:  Per MD    Gaynell Face, MS, RD, LDN Pager: [phone] Weekend/After Hours: [phone]

## 2017-09-17 NOTE — Progress Notes (Signed)
Physical Therapy Session Note  Patient Details  Name: Carla Bauer MRN: [id number] Date of Birth: [birth year]  Today's Date: 09/17/2017 PT Individual Time: [id number] PT Individual Time Calculation (min): 24 min   Short Term Goals: Week 2:  PT Short Term Goal 1 (Week 2): Pt will perform bed<>w/c transfer with max assist +1 PT Short Term Goal 2 (Week 2): Pt will ambulate x 30 ft with max assist +1 and LRAD PT Short Term Goal 3 (Week 2): Pt will initiate w/c propulsion   Skilled Therapeutic Interventions/Progress Updates:    Pt seated in TIS w/c upon PT arrival, family present. Pt resistant to participate in therapy tx. Agreeable to go outside this session only if daughter pushes w/c. Pt transported outside total assist. Pt worked on attention and tracking to midline while engaging in conversation with family. Therapist educating daughter on techniques to work on R side attention and R side awareness. Pt transported back to unit total assist. Pt becoming upset with daughter because she wants to go home, therapist and daughter educating pt on reasoning for participating in inpatient rehab. Pt resistant to all education and continues to get upset. Therapist educating family on techniques to re-direct the pt to another subject in order to calm pt down. Pt left reclined in TIS at end of session in care of family.   Therapy Documentation Precautions:  Precautions Precautions: Fall Restrictions Weight Bearing Restrictions: No   See Function Navigator for Current Functional Status.   Therapy/Group: Individual Therapy  Cresenciano GenreEmily van Schagen, PT, DPT 09/17/2017, 2:32 PM

## 2017-09-17 NOTE — Progress Notes (Signed)
    Referring Physician(s): Dr Wynn BankerKirsteins  Supervising Physician: Ruel FavorsShick, Trevor  [facility]  Chief Complaint:  Perc G tube placed 6/17  Subjective:  Resting No complaints afeb  Allergies: Patient has no known allergies.  Medications: Prior to Admission medications   Medication Sig Start Date End Date Taking? Authorizing Provider  acetaminophen (TYLENOL) 500 MG tablet Take 1 tablet (500 mg total) by mouth every 6 (six) hours as needed. Patient taking differently: Take 500 mg by mouth every 6 (six) hours as needed for mild pain.  10/09/13   Junious SilkMerrell, Hannah, PA-C  aspirin 81 MG chewable tablet Chew 1 tablet (81 mg total) by mouth daily. 09/04/17   Costella, Darci CurrentVincent J, PA-C  niMODipine (NYMALIZE) 60 MG/20ML SOLN Place 20 mLs (60 mg total) into feeding tube every 4 (four) hours for 3 days. 09/03/17 09/06/17  Costella, Darci CurrentVincent J, PA-C     Vital Signs: BP 105/80 (BP Location: Left Arm)   Pulse (!) 106   Temp 98 F (36.7 C) (Oral)   Resp 18   Ht 5\' 1"  (1.549 m)   Wt 143 lb 11.8 oz (65.2 kg)   SpO2 100%   BMI 27.16 kg/m   Physical Exam  Abdominal: Soft. Bowel sounds are normal.  Neurological: She is alert.  Skin: Skin is warm and dry.  Site is clean and dry NT no bleeding  Vitals reviewed.   Imaging: No results found.  Labs:  CBC: Recent Labs    08/23/17 0315 08/28/17 0242 09/03/17 1609 09/04/17 0655  WBC 19.8* 15.5* 9.7 8.8  HGB 10.7* 9.7* 9.9* 9.2*  HCT 33.6* 30.5* 31.6* 29.1*  PLT 389 444* 376 370    COAGS: Recent Labs    08/14/17 0648 09/16/17 1002  INR 1.01 1.01  APTT 25  --     BMP: Recent Labs    08/27/17 0249 08/28/17 0242 09/03/17 1609 09/04/17 0655 09/11/17 1116  NA 147* 142  --  145 142  K 3.4* 3.4*  --  3.1* 4.2  CL 115* 108  --  105 102  CO2 23 24  --  30 32  GLUCOSE 165* 190*  --  150* 182*  BUN 18 13  --  11 19  CALCIUM 9.4 9.5  --  9.6 10.1  CREATININE 0.55 0.55 0.50 0.49 0.66  GFRNONAA >60 >60 >60 >60  >60  GFRAA >60 >60 >60 >60 >60    LIVER FUNCTION TESTS: Recent Labs    08/14/17 0648 09/04/17 0655  BILITOT 0.6 0.3  AST 22 26  ALT 16 24  ALKPHOS 66 113  PROT 7.5 6.6  ALBUMIN 3.9 2.9*    Assessment and Plan:  May use G tube now  Electronically Signed: Milayah Krell A, PA-C 09/17/2017, 6:36 AM   I spent a total of 15 Minutes at the the patient's bedside AND on the patient's hospital floor or unit, greater than 50% of which was counseling/coordinating care for perc G tube

## 2017-09-17 NOTE — Progress Notes (Addendum)
Physical Therapy Session Note  Patient Details  Name: Carla Bauer MRN: [id number] Date of Birth: 1956/02/03  Today's Date: 09/17/2017 PT Individual Time: 1100-1145 AND 1620-1650 PT Individual Time Calculation (min): 45 min  AND 30 min and Today's Date: 09/17/2017 PT Missed Time: 15 Minutes Missed Time Reason: Patient unwilling to participate  Short Term Goals: Week 2:  PT Short Term Goal 1 (Week 2): Pt will perform bed<>w/c transfer with max assist +1 PT Short Term Goal 2 (Week 2): Pt will ambulate x 30 ft with max assist +1 and LRAD PT Short Term Goal 3 (Week 2): Pt will initiate w/c propulsion   Skilled Therapeutic Interventions/Progress Updates:   Session 1:  Pt in TIS and agreeable to therapy, no c/o pain. Therapy tech present, engaging w/ pt, this therapist provided skilled direction for tech to verbally cue pt to increase her participation. However this therapist performing all hands-on assist w/o manual assist from tech. Switched to manual w/c as pt increasingly reaching to push herself when in [place]. Instructed and performed ~150' self-propulsion w/ min assist for R obstacle avoidance. Verbal, visual, and tactile cues for L hemi technique. Pt appeared to enjoy task and being able to push herself, will continue to assess use of manual vs TIS chair for increased independence w/ locomotion. Performed NuStep 4 min @ level 3 for reciprocal movement pattern and to increase R body awareness, however pt w/ minimal attention to right side despite max cues. At 4 minutes, pt stopped and refused to do any further activity stating "I'm done for now" and "I'm not doing any more" despite max encouragement from therapy tech. Returned to room in [place] and ended session in [place], call bell within reach and all needs met. Quick release belt and chair alarm engaged. Missed 15 min of skilled PT 2/2 refusal.   Session 2:  Pt seen for 30 min of make-up time. Pt asleep in supine and easily woken up. Session  focused on building rapport w/ patient and engaging pt in functional tasks. Pt pleasant towards therapist and agreeable to sit-up and drink a cup of nectar thick coffee. Transferred to EOB w/ mod assist and maintained static sitting balance for >15 min w/ min assist fading to close supervision at times. Focused on attending to task of safely drinking coffee, attending to R environment as therapist sitting on pt's R, and postural control. Min tactile cues for balance strategies. Increased fatigue w/ prolonged sitting w/o back support, transferred to TIS via stand pivot w/ mod-max assist. Ended session in TIS and in care of RN, all needs met. Pt demonstrated no signs of agitation this session, even when this therapist was making eye contact w/ pt, providing physical assistance, and while donning quick release belt.   Therapy Documentation Precautions:  Precautions Precautions: Fall Restrictions Weight Bearing Restrictions: No General: PT Amount of Missed Time (min): 15 Minutes PT Missed Treatment Reason: Patient unwilling to participate Vital Signs: Therapy Vitals Pulse Rate: (!) 107 BP: 133/86 Pain: Pain Assessment Pain Scale: 0-10 Pain Score: 0-No pain  See Function Navigator for Current Functional Status.   Therapy/Group: Individual Therapy  Ramsey Guadamuz K Arnette 09/17/2017, 11:47 AM

## 2017-09-17 NOTE — Progress Notes (Signed)
Occupational Therapy Session Note  Patient Details  Name: Carla RightDorothy A Pedone MRN: [id number] Date of Birth: 06/09/1955  Today's Date: 09/17/2017 OT Individual Time: 1000-1100 Session 2: 1300-1315 OT Individual Time Calculation (min): 60 min Session 2: 15 min OT Missed Time: 15 min d/t pt unwilling/refused    Short Term Goals: Week 2:  OT Short Term Goal 1 (Week 2): Pt will sit EOB with no more than mod A while participating in BADL task OT Short Term Goal 2 (Week 2): Pt will locate 1/4 grooming items at the sink with mod instructional cues OT Short Term Goal 3 (Week 2): Pt will complete toilet transfer with max +2 assist  Skilled Therapeutic Interventions/Progress Updates:    Pt sitting up in w/c agreeable to therapy. Session focused on functional mobility and b/d tasks. Pt had improved participation, demeanor, and self-regulation. Session was facilitated by therapy tech who pt prefers engaging with, with cueing and therapeutic direction provided 100% of session. Pt provided manual facilitation for placement for R UE throughout b/d tasks with increased flexor tone present. Pt completed 3x sit to stand in stedy with min- CGA for lifting. Max A required to position R UE on stedy bar. Pt completed peri hygiene while seated in w/c with min A for thoroughness. Pt able to lace R LE through pants while sitting with vc for positioning R LE. Pt was then transported to therapy gym for time management. Pt completed sit to stand at parallel bars with min A and manual facilitation for R UE placement. Mod-max A +2 provided during 5 ft of functional mobility in parallel bars! Pt was able to advance R LE with min A. Pt was returned to room and left in w/c with QRB donned.   Session 2: Entered room with family present. Discussed therapy goals and POC with pt and family. Pt presenting with increased agitation this session, arguing/yelling with family members. Vc provided for self-regulation and de-escalation with no  success. Attempted to bring pt down to therapy gym to complete functional mobility but pt unable to self-regulate agitation/anger and session was terminated for safety. Pt returned to room and left with telesitter and QRB donned.   Therapy Documentation Precautions:  Precautions Precautions: Fall Restrictions Weight Bearing Restrictions: No General: General PT Missed Treatment Reason: Patient unwilling to participate Vital Signs: Therapy Vitals Pulse Rate: (!) 107 BP: 133/86 Pain: Pain Assessment Pain Scale: 0-10 Pain Score: 0-No pain ADL: ADL ADL Comments: Please see functional navigator  See Function Navigator for Current Functional Status.   Therapy/Group: Individual Therapy  Crissie ReeseSandra H Cataleya Cristina 09/17/2017, 12:59 PM

## 2017-09-17 NOTE — Progress Notes (Signed)
Speech Language Pathology Daily Session Note  Patient Details  Name: Carla Bauer A Tedesco MRN: [id number] Date of Birth: December 15, 1955  Today's Date: 09/17/2017 SLP Individual Time: 0730-0825 SLP Individual Time Calculation (min): 55 min  Short Term Goals: Week 2: SLP Short Term Goal 1 (Week 2): Pt will verbally respond to basic, personally, relevant questions in 25% of opportunities with max assist multimodal cues.  SLP Short Term Goal 2 (Week 2): Pt will follow 1 step commands during basic, structured tasks for 75% accuracy with max assist multimodal cues.  SLP Short Term Goal 3 (Week 2): Pt will focus her attention to a targeted stimulus in 75% of opportunities with max assist multimodal cues.   SLP Short Term Goal 4 (Week 2): Pt will locate items at midline in 50% of opportunities during basic, structured tasks with max assist multimodal cues.  SLP Short Term Goal 5 (Week 2): Pt will consume dys 1 textures and nectar thick liquids with max assist multimodal cues for use of swallowing precautions and no overt s/s of aspiration.   Skilled Therapeutic Interventions: Skilled treatment session focused on dysphagia and cognitive goals. SLP facilitated session by providing Max A verbal cues for small bites/sips with Dys. 1 textures and nectar-thick liquids which led to severe right anterior spillage that required total A to self-correct. Patient with intermittent holding of liquids but did not demonstrate any overt s/s of aspiration. Patient demonstrated language of confusion with total A needed for orientation. Patient demonstrated sustained attention to basic tasks for 2 minute intervals with Max A multimodal cues and required Max A verbal cues to sort from a field of 2. Patient left upright at RN station with all needs within reach. Continue with current plan of care.      Function:  Eating Eating   Modified Consistency Diet: Yes Eating Assist Level: Help managing cup/glass;Supervision or verbal  cues;Set up assist for;Helper scoops food on utensil           Cognition Comprehension Comprehension assist level: Understands basic 25 - 49% of the time/ requires cueing 50 - 75% of the time  Expression   Expression assist level: Expresses basis less than 25% of the time/requires cueing >75% of the time.  Social Interaction Social Interaction assist level: Interacts appropriately less than 25% of the time. May be withdrawn or combative.  Problem Solving Problem solving assist level: Solves basic less than 25% of the time - needs direction nearly all the time or does not effectively solve problems and may need a restraint for safety  Memory Memory assist level: Recognizes or recalls less than 25% of the time/requires cueing greater than 75% of the time    Pain Pain Assessment Pain Scale: 0-10 Pain Score: 0-No pain  Therapy/Group: Individual Therapy  Kacy Conely 09/17/2017, 12:54 PM

## 2017-09-17 NOTE — Progress Notes (Signed)
Hypoglycemic Event  CBG: 59  Treatment: D50  IV 25ML  Symptoms: None  Follow-up CBG: Time:2002  CBG Result: 146  Possible Reasons for Event: Patient NPO from PEG placement    Comments/MD notified" Protocol in place. Carla Inaan Angiulli, PA on call    Carla Bauer, Carla Bauer

## 2017-09-17 NOTE — Plan of Care (Signed)
  Problem: RH BOWEL ELIMINATION Goal: RH STG MANAGE BOWEL WITH ASSISTANCE Description STG Manage Bowel with Assistance. Mod  Outcome: Progressing   Problem: RH BLADDER ELIMINATION Goal: RH STG MANAGE BLADDER WITH ASSISTANCE Description STG Manage Bladder With Assistance. Mod  Outcome: Progressing Goal: RH STG MANAGE BLADDER WITH EQUIPMENT WITH ASSISTANCE Description STG Manage Bladder With Equipment With Assistance. MOD  Outcome: Progressing   Problem: RH COGNITION-NURSING Goal: RH STG USES MEMORY AIDS/STRATEGIES W/ASSIST TO PROBLEM SOLVE Description STG Uses Memory Aids/Strategies With mod  Assistance to Problem Solve. Cues and supervision   Outcome: Progressing   Problem: Consults Goal: RH STROKE PATIENT EDUCATION Description See Patient Education module for education specifics  Outcome: Progressing

## 2017-09-17 NOTE — Progress Notes (Signed)
Subjective/Complaints:  DIsoriented, not  Refusing exam today  ROS: Limited due to cognitive/behavioral   Objective: Vital Signs: Blood pressure 105/80, pulse (!) 106, temperature 98 F (36.7 C), temperature source Oral, resp. rate 18, height 5\' 1"  (1.549 m), weight 65.2 kg (143 lb 11.8 oz), SpO2 100 %. No results found. Results for orders placed or performed during the hospital encounter of 09/03/17 (from the past 72 hour(s))  Glucose, capillary     Status: Abnormal   Collection Time: 09/14/17  8:17 AM  Result Value Ref Range   Glucose-Capillary 130 (H) 65 - 99 mg/dL  Glucose, capillary     Status: Abnormal   Collection Time: 09/14/17 11:39 AM  Result Value Ref Range   Glucose-Capillary 125 (H) 65 - 99 mg/dL  Glucose, capillary     Status: Abnormal   Collection Time: 09/14/17  4:18 PM  Result Value Ref Range   Glucose-Capillary 120 (H) 65 - 99 mg/dL  Glucose, capillary     Status: Abnormal   Collection Time: 09/14/17  7:55 PM  Result Value Ref Range   Glucose-Capillary 183 (H) 65 - 99 mg/dL  Glucose, capillary     Status: Abnormal   Collection Time: 09/15/17 12:20 AM  Result Value Ref Range   Glucose-Capillary 188 (H) 65 - 99 mg/dL  Glucose, capillary     Status: Abnormal   Collection Time: 09/15/17  4:15 AM  Result Value Ref Range   Glucose-Capillary 197 (H) 65 - 99 mg/dL  Glucose, capillary     Status: Abnormal   Collection Time: 09/15/17  9:03 AM  Result Value Ref Range   Glucose-Capillary 136 (H) 65 - 99 mg/dL  Glucose, capillary     Status: None   Collection Time: 09/15/17 11:54 AM  Result Value Ref Range   Glucose-Capillary 73 65 - 99 mg/dL  Glucose, capillary     Status: None   Collection Time: 09/15/17  4:38 PM  Result Value Ref Range   Glucose-Capillary 98 65 - 99 mg/dL  Glucose, capillary     Status: Abnormal   Collection Time: 09/15/17  7:57 PM  Result Value Ref Range   Glucose-Capillary 143 (H) 65 - 99 mg/dL   Comment 1 Notify RN   Glucose,  capillary     Status: Abnormal   Collection Time: 09/16/17 12:01 AM  Result Value Ref Range   Glucose-Capillary 165 (H) 65 - 99 mg/dL  Glucose, capillary     Status: Abnormal   Collection Time: 09/16/17  4:35 AM  Result Value Ref Range   Glucose-Capillary 64 (L) 65 - 99 mg/dL  Glucose, capillary     Status: Abnormal   Collection Time: 09/16/17  5:06 AM  Result Value Ref Range   Glucose-Capillary 131 (H) 65 - 99 mg/dL  Protime-INR     Status: None   Collection Time: 09/16/17 10:02 AM  Result Value Ref Range   Prothrombin Time 13.2 11.4 - 15.2 seconds   INR 1.01     Comment: Performed at [facility], [address] [place], [place] [address]  Glucose, capillary     Status: Abnormal   Collection Time: 09/16/17 11:36 AM  Result Value Ref Range   Glucose-Capillary 64 (L) 65 - 99 mg/dL   Comment 1 Notify RN   Glucose, capillary     Status: Abnormal   Collection Time: 09/16/17 12:04 PM  Result Value Ref Range   Glucose-Capillary 121 (H) 65 - 99 mg/dL  Glucose, capillary  Status: Abnormal   Collection Time: 09/16/17  5:03 PM  Result Value Ref Range   Glucose-Capillary 149 (H) 65 - 99 mg/dL  Glucose, capillary     Status: Abnormal   Collection Time: 09/16/17  7:35 PM  Result Value Ref Range   Glucose-Capillary 59 (L) 65 - 99 mg/dL  Glucose, capillary     Status: Abnormal   Collection Time: 09/16/17  8:02 PM  Result Value Ref Range   Glucose-Capillary 146 (H) 65 - 99 mg/dL  Glucose, capillary     Status: Abnormal   Collection Time: 09/16/17  9:35 PM  Result Value Ref Range   Glucose-Capillary 184 (H) 65 - 99 mg/dL  Glucose, capillary     Status: Abnormal   Collection Time: 09/16/17 11:39 PM  Result Value Ref Range   Glucose-Capillary 120 (H) 65 - 99 mg/dL  Glucose, capillary     Status: Abnormal   Collection Time: 09/17/17  3:52 AM  Result Value Ref Range   Glucose-Capillary 105 (H) 65 - 99 mg/dL     Constitutional: No distress . Vital signs reviewed. HEENT:  EOMI, oral membranes moist Extremity:  No Edema Heart RRR no Murmur Lungs Clear Ext without edema Skin:   Wound well healed incision Left crani, staples in Ventriculostomy placement site Neuro:confused, distracted.  Abnormal   Abnormal Motor 0/5 RUE and RLE, at least 4/5 on Left side, Abnormal FMC Ataxic/ dec FMC, Dysarthric and Aphasic  Gen NAD   Assessment/Plan: 1. 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 Physiatrist is providing close team supervision and 24 hour management of active medical problems listed below. Physiatrist and rehab team continue to assess barriers to discharge/monitor patient progress toward functional and medical goals. FIM: Function - Bathing Position: Wheelchair/chair at sink Body parts bathed by patient: Abdomen, Chest, Right arm, Front perineal area Body parts bathed by helper: Left lower leg, Right lower leg, Left upper leg, Right upper leg, Buttocks, Left arm Assist Level: 2 helpers  Function- Upper Body Dressing/Undressing What is the patient wearing?: Pull over shirt/dress Pull over shirt/dress - Perfomed by patient: Thread/unthread left sleeve, Put head through opening Pull over shirt/dress - Perfomed by helper: Pull shirt over trunk, Thread/unthread right sleeve Assist Level: Touching or steadying assistance(Pt > 75%) Function - Lower Body Dressing/Undressing What is the patient wearing?: Pants Position: Wheelchair/chair at sink Pants- Performed by helper: Thread/unthread right pants leg, Thread/unthread left pants leg, Pull pants up/down Non-skid slipper socks- Performed by helper: Don/doff right sock, Don/doff left sock Assist for footwear: Dependant Assist for lower body dressing: 2 Helpers  Function - Toileting Toileting activity did not occur: No continent bowel/bladder event Toileting  steps completed by helper: Adjust clothing prior to toileting, Performs perineal hygiene, Adjust clothing after toileting Assist level: Two helpers  Function - Archivist transfer activity did not occur: Safety/medical concerns Toilet transfer assistive device: Systems developer lift: Stedy Assist level to toilet: 2 helpers Assist level from toilet: 2 helpers  Function - Chair/bed transfer Chair/bed transfer method: Stand pivot Chair/bed transfer assist level: 2 helpers Chair/bed transfer assistive device: Armrests, Bedrails Mechanical lift: Stedy Chair/bed transfer details: Manual facilitation for weight shifting, Manual facilitation for placement, Verbal cues for technique, Verbal cues for precautions/safety  Function - Locomotion: Wheelchair Will patient use wheelchair at discharge?: Yes Type: Manual Max wheelchair distance: 150 ft Assist Level: Dependent (Pt equals  0%) Assist Level: Dependent (Pt equals 0%) Assist Level: Dependent (Pt equals 0%) Turns around,maneuvers to table,bed, and toilet,negotiates 3% grade,maneuvers on rugs and over doorsills: No Function - Locomotion: Ambulation Ambulation activity did not occur: Safety/medical concerns Assistive device: Rail in hallway Max distance: 30' Assist level: 2 helpers Assist level: 2 helpers  Function - Comprehension Comprehension: Auditory Comprehension assist level: Understands basic 25 - 49% of the time/ requires cueing 50 - 75% of the time  Function - Expression Expression: Verbal Expression assist level: Expresses basis less than 25% of the time/requires cueing >75% of the time.  Function - Social Interaction Social Interaction assist level: Interacts appropriately less than 25% of the time. May be withdrawn or combative.  Function - Problem Solving Problem solving assist level: Solves basic less than 25% of the time - needs direction nearly all the time or does not effectively solve problems  and may need a restraint for safety  Function - Memory Memory assist level: Recognizes or recalls less than 25% of the time/requires cueing greater than 75% of the time Patient normally able to recall (first 3 days only): None of the above   Medical Problem List and Plan: 1.Decreased functional mobility with right-sided weaknesssecondary to intraparenchymal hematoma in the medial left temporal lobe status post left craniotomy clipping of aneurysm placement of right frontal external ventricular drain 08/14/2017 -CIR PT, OT. SLP-team conf in am  2. DVT Prophylaxis/Anticoagulation: Subcutaneous heparin. LE vascular study negative 3. Pain Management:Tylenol as needed 4. Mood:Provide emotional support  -Add scheduled Seroquel at night-reduced dose secondary to sedation 12.5  -Add as needed Seroquel for agitation.  Want to avoid oversedation 5. Neuropsych: This patientis notcapable of making decisions on herown behalf. 6. Skin/Wound Care:Routine skin checks 7. Fluids/Electrolytes/Nutrition:Routine in and outs with follow-up chemistries -NGT feedings, adjust rate/H20 as needed- dietary consult PEG tube placed, per Rad PA ok to use Tube today  8.Dysphagia. Dysphagia #1 nectar liquids. Nasogastric tube feeds for nutritional support, will need  PEG  Given  9.Hyperglycemia induced from tube feeds. Continue Lantus insulin for coverage. CBG (last 3)  Recent Labs    09/16/17 2135 09/16/17 2339 09/17/17 0352  GLUCAP 184* 120* 105*  Good range for hospital control 6/18 10.Hypertension. Lopressor 50 mg twice daily Vitals:   09/16/17 1934 09/17/17 0356  BP: (!) 143/96 105/80  Pulse: (!) 126 (!) 106  Resp: 18 18  Temp: 98 F (36.7 C) 98 F (36.7 C)  SpO2:  100%  BP is creeping up with mild sinus tachycardia-lopressor 100mg  BID 11.  Anemia,Hgb 9.2 Baseline Hgb ?11.8, had some post op blood loss,  stool guaic pending, Normal Fe studies 6/10 12.   Agitation due to confusion , balancing control of agitation with sedation  13. Sinus tachy- likely multifactorial anemia, not hypoxic ,6/12 normal BMET Lopressor was increased to 100 mg twice a day on 06/15/2017, continue to monitor affect LOS (Days) 14 A FACE TO FACE EVALUATION WAS PERFORMED  Erick Colace 09/17/2017, 7:46 AM

## 2017-09-18 ENCOUNTER — Inpatient Hospital Stay (HOSPITAL_COMMUNITY): Payer: Self-pay | Admitting: Occupational Therapy

## 2017-09-18 ENCOUNTER — Inpatient Hospital Stay (HOSPITAL_COMMUNITY): Payer: Self-pay

## 2017-09-18 ENCOUNTER — Encounter (HOSPITAL_COMMUNITY): Payer: Self-pay | Admitting: Psychology

## 2017-09-18 ENCOUNTER — Inpatient Hospital Stay (HOSPITAL_COMMUNITY): Payer: Medicaid Other | Admitting: Speech Pathology

## 2017-09-18 LAB — GLUCOSE, CAPILLARY
GLUCOSE-CAPILLARY: 254 mg/dL — AB (ref 65–99)
GLUCOSE-CAPILLARY: 199 mg/dL — AB (ref 65–99)
Glucose-Capillary: 112 mg/dL — ABNORMAL HIGH (ref 65–99)
Glucose-Capillary: 110 mg/dL — ABNORMAL HIGH (ref 65–99)

## 2017-09-18 MED ORDER — FREE WATER
200.0000 mL | Freq: Four times a day (QID) | Status: DC
  Administered 2017-09-18 – 2017-09-24 (×25): 200 mL

## 2017-09-18 NOTE — Patient Care Conference (Signed)
Inpatient RehabilitationTeam Conference and Plan of Care Update Date: 09/18/2017   Time: 11:20 AM    Patient Name: Carla Bauer      Medical Record Number: [id number]  Date of Birth: Feb 12, 1956 Sex: Female         Room/Bed: 4W16C/4W16C-01 Payor Info: Payor: MEDICAID POTENTIAL / Plan: MEDICAID POTENTIAL / Product Type: *No Product type* /    Admitting Diagnosis: ICH CORTRAK  Admit Date/Time:  09/03/2017  3:38 PM Admission Comments: No comment available   Primary Diagnosis:  <principal problem not specified> Principal Problem: <principal problem not specified>  Patient Active Problem List   Diagnosis Date Noted  . Intraparenchymal hematoma of brain (HCC) 09/03/2017  . Right hemiparesis (HCC)   . Oropharyngeal dysphagia   . Essential hypertension   . Acute lower UTI   . Tachypnea   . Tachycardia   . Reactive hypertension   . Prediabetes   . Acute blood loss anemia   . Hypokalemia   . Fever   . Leukocytosis   . Acute respiratory failure with hypoxia (HCC)   . 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 ICH (intracerebral hemorrhage) (HCC) 08/14/2017  . SAH (subarachnoid hemorrhage) (HCC) 08/14/2017  . 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 Hydrocephalus 08/14/2017  . Brain herniation (HCC) 08/14/2017  . Cytotoxic brain edema (HCC) 08/14/2017    Expected Discharge Date: Expected Discharge Date: (SNF)  Team Members Present: Physician leading conference: Dr. Claudette LawsAndrew Kirsteins Social Worker Present: Dossie DerBecky Deandrea Vanpelt, LCSW Nurse Present: Other (comment)(Kalya Mabe-RN) PT Present: Woodfin GanjaEmily Van Shagen, PT OT Present: Kearney HardElisabeth Doe, OT SLP Present: Feliberto Gottronourtney Payne, SLP PPS Coordinator present : Tora DuckMarie Noel, RN, CRRN     Current Status/Progress Goal Weekly Team Focus  Medical   s/p PEG, Left Ptosis, incont bowel adn bladder , aphasic  improve nutrition, improve bladder and bowel  cont  Cont Rehab   Bowel/Bladder   (P) incontinent of bowel and bladder, LBM 09-15-17  (P) intermittent continent of bowel and bladder, maintain regular bowel pattern  (P) timed toileting q3   Swallow/Nutrition/ Hydration   Dys. 1 textures with nectar-thick liquids, Max A  Min A  use of small bites/sips, trials of upgraded liquids/textures    ADL's   Mod A for BADLs pending participation, Mod-max A functional transfers  min/mod A goals  self-regulation, participation, R NMR, transfers, standing balance, R attention   Mobility   mod-max assist overall transfers and gait at rail 30', min assist manual w/c mobility  min assist  progressing gait and transfers, transitioning to manual w/c if safe, R NMR and R attention   Communication   Overall Max A   Mod A  self-monitoring and correcting errors, intelligibility    Safety/Cognition/ Behavioral Observations  Total A  Mod A  initiation, attention    Pain   (P) c/o pain at peg site prn tylenol prn   (P) pain, or =3   (P) Assess pain q shift and prn and treat prn   Skin   (P) healing incion to head   (P) no new skin issues         *See Care Plan and progress notes for long and short-term goals.     Barriers to Discharge  Current Status/Progress Possible Resolutions Date Resolved   Physician    Medical stability;Incontinence     very slow progress  PEG feeds, Teach family if they plan to take her  home      Nursing                  PT  Behavior  Pt participating more in therapy, does much better w/ therapy tech present to engage w/ pt, however still adamantly refuses at times. She can be easily agitated or combative towards therapists.               OT                  SLP                SW                Discharge Planning/Teaching Needs:  Daughter's want to take her home but will need to work together, pt made aware of her need to participate in therapies to be able to stay here.      Team Discussion:  Participating in am  therapies- goals min-mod level. Peg placed and tolerating tube feeds. Neuro-psych saw for coping and to assist with behaviors surrounding therapies. Less incontinent-nursing working on. Family will need to decide home verus NHP and then can start working on this.  Revisions to Treatment Plan:  NH versus family providing care at home    Continued Need for Acute Rehabilitation Level of Care: The patient requires daily medical management by a physician with specialized training in physical medicine and rehabilitation for the following conditions: Daily direction of a multidisciplinary physical rehabilitation program to ensure safe treatment while eliciting the highest outcome that is of practical value to the patient.: Yes Daily medical management of patient stability for increased activity during participation in an intensive rehabilitation regime.: Yes Daily analysis of laboratory values and/or radiology reports with any subsequent need for medication adjustment of medical intervention for : Neurological problems;Nutritional problems  [name] 09/18/2017, 3:51 PM

## 2017-09-18 NOTE — Plan of Care (Signed)
  Problem: RH BOWEL ELIMINATION Goal: RH STG MANAGE BOWEL WITH ASSISTANCE Description STG Manage Bowel with Assistance. Mod  Outcome: Progressing   Problem: RH BLADDER ELIMINATION Goal: RH STG MANAGE BLADDER WITH ASSISTANCE Description STG Manage Bladder With Assistance. Mod  Outcome: Progressing Goal: RH STG MANAGE BLADDER WITH EQUIPMENT WITH ASSISTANCE Description STG Manage Bladder With Equipment With Assistance. MOD  Outcome: Progressing   Problem: RH COGNITION-NURSING Goal: RH STG USES MEMORY AIDS/STRATEGIES W/ASSIST TO PROBLEM SOLVE Description STG Uses Memory Aids/Strategies With mod  Assistance to Problem Solve. Cues and supervision   Outcome: Progressing   Problem: Consults Goal: RH STROKE PATIENT EDUCATION Description See Patient Education module for education specifics  Outcome: Progressing   

## 2017-09-18 NOTE — Consult Note (Signed)
Neuropsychological Consultation   Patient:   [name]   DOB:   23-May-1955  MR Number:  [id number]  [facility] [address] [id number] [place] [address] Dept: [phone] Loc: [phone]           Date of Service:   09/18/2017  Start Time:   11 AM End Time:   11:15 AM  Provider/Observer:  Arley PhenixJohn Rodenbough, Psy.D.       Clinical Neuropsychologist       Billing Code/Service: [phone] 1 Units  Attempted to visit with Carla CellaDorothy, a 62 year-old female with significant ICH.  Patient was not receptive with talking at all.  Tried to review symptoms and engage with patient.  She would either not talk or lean back to go back to sleep.  Reviewed issues with PA and Social Worker and these responses seem typical for patient.  Will attempt to see patient on another occasion when family is also there.        Electronically Signed   _______________________ Arley PhenixJohn Rodenbough, Psy.D.

## 2017-09-18 NOTE — Progress Notes (Signed)
Occupational Therapy Weekly Progress Note  Patient Details  Name: Carla Bauer MRN: [id number] Date of Birth: 08/25/1955  Beginning of progress report period: September 04, 2017 End of progress report period: September 18, 2017 Today's Date: 09/18/2017 OT Individual Time: 1303-1412 OT Individual Time Calculation (min): 69 min    Patient has met 2 of 3 short term goals.  Pt has made some progress with OT this week. When she will participate, she can be a mod A for most BADL tasks. She completed a toilet transfer today with Mod A, but has not been consistent. Pt continues to have L gaze preference, will occasionally look to midline with max multimodal cues depending on pt's behaviors at the time. Pt's R UE has developed a lot of flexor tone, and pt has no awareness of her R side for self-ROM strategies.   Patient continues to demonstrate the following deficits: muscle weakness and muscle joint tightness, impaired timing and sequencing, abnormal tone, unbalanced muscle activation, motor apraxia, ataxia, decreased coordination and decreased motor planning, decreased midline orientation, decreased attention to right, right side neglect, decreased motor planning and ideational apraxia, decreased initiation, decreased attention, decreased awareness, decreased problem solving, decreased safety awareness, decreased memory and delayed processing and decreased sitting balance, decreased standing balance, decreased postural control, hemiplegia and decreased balance strategies and therefore will continue to benefit from skilled OT intervention to enhance overall performance with BADL and Reduce care partner burden.  Patient not progressing toward long term goals.  See goal revision..  Plan of care revisions: Pt's goals have been changed to overall Mod A goals.  OT Short Term Goals Week 2:  OT Short Term Goal 1 (Week 2): Pt will sit EOB with no more than mod A while participating in BADL task OT Short Term Goal 1 -  Progress (Week 2): Met OT Short Term Goal 2 (Week 2): Pt will locate 1/4 grooming items at the sink with mod instructional cues OT Short Term Goal 2 - Progress (Week 2): Progressing toward goal OT Short Term Goal 3 (Week 2): Pt will complete toilet transfer with max +2 assist OT Short Term Goal 3 - Progress (Week 2): Met Week 3:  OT Short Term Goal 1 (Week 3): STG=LTG 2/2 ELOS  Skilled Therapeutic Interventions/Progress Updates:    Pt greeted seated in TIS wc and soaked of urine. Pt more pleasant and agreeable for most of OT session. Asked pt if she needed to go to the bathroom and pt stated "maybe." Pt brought into bathroom and completed stand-pivot to the stronger L side with mod A, then Mod A for balance while +2 assisted with clothing management. Pt voided bladder into commode. Pt then able to complete peri-care with verbal cues and assistance with positioning. Stand-pivot back to wc with mod A to stand, then needed max+2 to pivot to the R 2/2 poor body awareness and motor planning. Changed out wc cushion as pt had soaked previous cushion. Pt brought to the sink for multiple sit<>stands to change brief and pull up new pants. Pt needed mod A to stand, then mod A to maintain standing balance while reaching to pull up pants. Pt brought to the dayroom and requesting hot coffee. SLP providing  +2 assistance made pt cup of thickened coffee. Pt completed sit<>stand with mod A at raised table, OT facilitated weight bearing through R UE while SLP facilitated R attention and initiation activity with placing colored bean bags in bin-pt completed 3 with max multimodal cues to look   to midline. OT provided gentle massage and PROM to R UE for flexor tone. Pt took sips of coffee with verbal cues for correct sip size with spillage to R side of mouth. Pt becoming agitated and refused further sit<>stands. Pt returned to room and left seated in TIS wc with safety belt on and needs met.   Therapy  Documentation Precautions:  Precautions Precautions: Fall Restrictions Weight Bearing Restrictions: No Pain:  NONE DENIES PAIN ADL: ADL ADL Comments: Please see functional navigator     See Function Navigator for Current Functional Status.   Therapy/Group: Individual Therapy   S  09/18/2017, 2:11 PM  

## 2017-09-18 NOTE — Progress Notes (Signed)
Subjective/Complaints:  No new issues, not aware of tube unless cued, working well with PT this am  ROS: Limited due to cognitive/behavioral   Objective: Vital Signs: Blood pressure 107/82, pulse (!) 105, temperature 98.6 F (37 C), resp. rate 18, height 5' 1"  (1.549 m), weight 65.3 kg (143 lb 15.4 oz), SpO2 100 %. No results found. Results for orders placed or performed during the hospital encounter of 09/03/17 (from the past 72 hour(s))  Glucose, capillary     Status: None   Collection Time: 09/15/17 11:54 AM  Result Value Ref Range   Glucose-Capillary 73 65 - 99 mg/dL  Glucose, capillary     Status: None   Collection Time: 09/15/17  4:38 PM  Result Value Ref Range   Glucose-Capillary 98 65 - 99 mg/dL  Glucose, capillary     Status: Abnormal   Collection Time: 09/15/17  7:57 PM  Result Value Ref Range   Glucose-Capillary 143 (H) 65 - 99 mg/dL   Comment 1 Notify RN   Glucose, capillary     Status: Abnormal   Collection Time: 09/16/17 12:01 AM  Result Value Ref Range   Glucose-Capillary 165 (H) 65 - 99 mg/dL  Glucose, capillary     Status: Abnormal   Collection Time: 09/16/17  4:35 AM  Result Value Ref Range   Glucose-Capillary 64 (L) 65 - 99 mg/dL  Glucose, capillary     Status: Abnormal   Collection Time: 09/16/17  5:06 AM  Result Value Ref Range   Glucose-Capillary 131 (H) 65 - 99 mg/dL  Protime-INR     Status: None   Collection Time: 09/16/17 10:02 AM  Result Value Ref Range   Prothrombin Time 13.2 11.4 - 15.2 seconds   INR 1.01     Comment: Performed at [facility], [address], [place], [place] [address]  Glucose, capillary     Status: Abnormal   Collection Time: 09/16/17 11:36 AM  Result Value Ref Range   Glucose-Capillary 64 (L) 65 - 99 mg/dL   Comment 1 Notify RN   Glucose, capillary     Status: Abnormal   Collection Time: 09/16/17 12:04 PM  Result Value Ref Range   Glucose-Capillary 121 (H) 65 - 99 mg/dL  Glucose, capillary     Status:  Abnormal   Collection Time: 09/16/17  5:03 PM  Result Value Ref Range   Glucose-Capillary 149 (H) 65 - 99 mg/dL  Glucose, capillary     Status: Abnormal   Collection Time: 09/16/17  7:35 PM  Result Value Ref Range   Glucose-Capillary 59 (L) 65 - 99 mg/dL  Glucose, capillary     Status: Abnormal   Collection Time: 09/16/17  8:02 PM  Result Value Ref Range   Glucose-Capillary 146 (H) 65 - 99 mg/dL  Glucose, capillary     Status: Abnormal   Collection Time: 09/16/17  9:35 PM  Result Value Ref Range   Glucose-Capillary 184 (H) 65 - 99 mg/dL  Glucose, capillary     Status: Abnormal   Collection Time: 09/16/17 11:39 PM  Result Value Ref Range   Glucose-Capillary 120 (H) 65 - 99 mg/dL  Glucose, capillary     Status: Abnormal   Collection Time: 09/17/17  3:52 AM  Result Value Ref Range   Glucose-Capillary 105 (H) 65 - 99 mg/dL  Glucose, capillary     Status: Abnormal   Collection Time: 09/17/17  7:39 AM  Result Value Ref Range   Glucose-Capillary 106 (H) 65 - 99  mg/dL  Glucose, capillary     Status: Abnormal   Collection Time: 09/17/17  8:24 AM  Result Value Ref Range   Glucose-Capillary 165 (H) 65 - 99 mg/dL   Comment 1 Notify RN   Glucose, capillary     Status: Abnormal   Collection Time: 09/17/17 11:46 AM  Result Value Ref Range   Glucose-Capillary 164 (H) 65 - 99 mg/dL   Comment 1 Notify RN   Glucose, capillary     Status: Abnormal   Collection Time: 09/17/17  4:26 PM  Result Value Ref Range   Glucose-Capillary 108 (H) 65 - 99 mg/dL  Glucose, capillary     Status: Abnormal   Collection Time: 09/17/17  9:13 PM  Result Value Ref Range   Glucose-Capillary 108 (H) 65 - 99 mg/dL  Glucose, capillary     Status: Abnormal   Collection Time: 09/18/17  6:45 AM  Result Value Ref Range   Glucose-Capillary 112 (H) 65 - 99 mg/dL     Constitutional: No distress . Vital signs reviewed. HEENT: EOMI, oral membranes moist Extremity:  No Edema Heart RRR no Murmur Lungs Clear Ext  without edema Skin:   Wound well healed incision Left crani, staples in Ventriculostomy placement site Neuro:confused, distracted.  Abnormal   Abnormal Motor 0/5 RUE and RLE, at least 4/5 on Left side, Abnormal FMC Ataxic/ dec FMC, Dysarthric and Aphasic  Gen NAD   Assessment/Plan: 1. 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 Physiatrist is providing close team supervision and 24 hour management of active medical problems listed below. Physiatrist and rehab team continue to assess barriers to discharge/monitor patient progress toward functional and medical goals. FIM: Function - Bathing Position: Wheelchair/chair at sink Body parts bathed by patient: Abdomen, Chest, Right arm, Front perineal area, Right upper leg, Left upper leg Body parts bathed by helper: Right lower leg, Left lower leg, Back, Left arm, Buttocks Assist Level: Touching or steadying assistance(Pt > 75%)  Function- Upper Body Dressing/Undressing What is the patient wearing?: Pull over shirt/dress Pull over shirt/dress - Perfomed by patient: Thread/unthread left sleeve, Put head through opening, Pull shirt over trunk Pull over shirt/dress - Perfomed by helper: Thread/unthread right sleeve Assist Level: Touching or steadying assistance(Pt > 75%) Function - Lower Body Dressing/Undressing What is the patient wearing?: Pants Position: Other (comment)(stedy) Pants- Performed by patient: Pull pants up/down Pants- Performed by helper: Thread/unthread right pants leg, Thread/unthread left pants leg Non-skid slipper socks- Performed by helper: Don/doff right sock, Don/doff left sock Assist for footwear: Partial/moderate assist Assist for lower body dressing: 2 Helpers  Function - Toileting Toileting activity did not occur: No continent bowel/bladder event Toileting steps  completed by helper: Adjust clothing prior to toileting, Performs perineal hygiene, Adjust clothing after toileting Assist level: Two helpers  Function - Air cabin crew transfer activity did not occur: Safety/medical concerns Toilet transfer assistive device: Facilities manager lift: Stedy Assist level to toilet: 2 helpers Assist level from toilet: 2 helpers  Function - Chair/bed transfer Chair/bed transfer method: Stand pivot Chair/bed transfer assist level: 2 helpers Chair/bed transfer assistive device: Armrests, Bedrails Mechanical lift: Stedy Chair/bed transfer details: Manual facilitation for weight shifting, Manual facilitation for placement, Verbal cues for technique, Verbal cues for precautions/safety  Function - Locomotion: Wheelchair Will patient use wheelchair at discharge?: Yes Type: Manual Max wheelchair distance: 150 ft Assist Level: Touching or steadying  assistance (Pt > 75%) Assist Level: Touching or steadying assistance (Pt > 75%) Assist Level: Touching or steadying assistance (Pt > 75%) Turns around,maneuvers to table,bed, and toilet,negotiates 3% grade,maneuvers on rugs and over doorsills: No Function - Locomotion: Ambulation Ambulation activity did not occur: Safety/medical concerns Assistive device: Parallel bars Max distance: 5 Assist level: 2 helpers Assist level: 2 helpers  Function - Comprehension Comprehension: Auditory Comprehension assist level: Understands basic 25 - 49% of the time/ requires cueing 50 - 75% of the time  Function - Expression Expression: Verbal Expression assist level: Expresses basis less than 25% of the time/requires cueing >75% of the time.  Function - Social Interaction Social Interaction assist level: Interacts appropriately less than 25% of the time. May be withdrawn or combative.  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 SLP, Team conference today please see physician documentation under team conference tab, met with team face-to-face to discuss problems,progress, and goals. Formulized individual treatment plan based on medical history, underlying problem and comorbidities.  2. DVT Prophylaxis/Anticoagulation: Subcutaneous heparin. LE vascular study negative 3. Pain Management:Tylenol as needed 4. Mood:Provide emotional support  -Add scheduled Seroquel at night-reduced dose secondary to sedation 12.5  -Add as needed Seroquel for agitation.  Want to avoid oversedation 5. Neuropsych: This patientis notcapable of making decisions on herown behalf. 6. Skin/Wound Care:Routine skin checks 7. Fluids/Electrolytes/Nutrition:Routine in and outs with follow-up chemistries -NGT feedings, adjust rate/H20 as needed- dietary consult PEG tube placed, per Rad PA tolerating Osmolite bolus 258m QID 8.Dysphagia. Dysphagia #1 nectar liquids. Nasogastric tube feeds for nutritional support, will need  PEG  Given  9.Hyperglycemia induced from tube feeds. Continue Lantus insulin for coverage. CBG (last 3)  Recent Labs    09/17/17 1626 09/17/17 2113 09/18/17 0645  GLUCAP 108* 108* 112*  Good range for hospital control 6/19 10.Hypertension. Lopressor 50 mg twice daily Vitals:   09/18/17 0419 09/18/17 0851  BP: 101/72  107/82  Pulse: 98 (!) 105  Resp: 18   Temp: 98.6 F (37 C)   SpO2: 100%   BP controlled 6/19 with mild sinus tachycardia-lopressor 1047mBID 11.  Anemia,Hgb 9.2 Baseline Hgb ?11.8, had some post op blood loss,  stool guaic pending, Normal Fe studies 6/10 12.  Agitation due to confusion , balancing control of agitation with sedation  13. Sinus tachy- likely multifactorial anemia, not hypoxic ,6/12 normal BMET Lopressor was increased to 100 mg twice a day on 09/15/2017, continue to monitor affect, HR 105 this am LOS (Days) 15 A FACE TO FACE EVALUATION WAS PERFORMED  AnCharlett Blake/19/2019, 9:10 AM

## 2017-09-18 NOTE — Plan of Care (Signed)
  Problem: RH Balance Goal: LTG: Patient will maintain dynamic sitting balance (OT) Description LTG:  Patient will maintain dynamic sitting balance with assistance during activities of daily living (OT) Flowsheets (Taken 09/18/2017 0709) LTG: Pt will maintain dynamic sitting balance during ADLs with:: Contact Guard/Touching assist Note:  Upgraded 6/19-ESD   Problem: Sit to Stand Goal: LTG:  Patient will perform sit to stand in prep for activites of daily living with assistance level (OT) Description LTG:  Patient will perform sit to stand in prep for activites of daily living with assistance level (OT) Flowsheets (Taken 09/18/2017 0709) LTG: PT will perform sit to stand in prep for activites of daily living with assistance level: Minimal Assistance - Patient > 75% Note:  Upgraded 6/19-ESD   Problem: RH Dressing Goal: LTG Patient will perform upper body dressing (OT) Description LTG Patient will perform upper body dressing with assist, with/without cues (OT). Flowsheets (Taken 09/18/2017 0709) LTG: Pt will perform upper body dressing with assistance level of: Minimal Assistance - Patient > 75% Note:  Upgraded 6/19-ESD Goal: LTG Patient will perform lower body dressing w/assist (OT) Description LTG: Patient will perform lower body dressing with assist, with/without cues in positioning using equipment (OT) Flowsheets (Taken 09/18/2017 0709) LTG: Pt will perform lower body dressing with assistance level of: Minimal Assistance - Patient > 75% Note:  Upgraded 6/19-ESD   Problem: RH Toilet Transfers Goal: LTG Patient will perform toilet transfers w/assist (OT) Description LTG: Patient will perform toilet transfers with assist, with/without cues using equipment (OT) Flowsheets (Taken 09/18/2017 0709) LTG: Pt will perform toilet transfers with assistance level of: Moderate Assistance - Patient 50 - 74% Note:  Upgraded 6/19-ESD

## 2017-09-18 NOTE — Progress Notes (Signed)
Physical Therapy Weekly Progress Note  Patient Details  Name: Carla Bauer MRN: [id number] Date of Birth: [birth year]  Beginning of progress report period: September 11, 2017 End of progress report period: September 18, 2017  Today's Date: 09/18/2017 PT Individual Time: 0900-1000 PT Individual Time Calculation (min): 60 min   Patient has met 2 of 3 short term goals. Pt continues to demonstrate poor participation with all therapies. When the pt is engaged and willing to participate she can perform stand pivot transfers with mod-max assist, sit<>stand with mod assist and gait up to 30 ft with L handrail and max assist, however this is very inconsistent. Pt often lashes out at family and staff members and refuses participation in therapy. Pt should be discharged from this setting as soon as family education is completed.   Patient continues to demonstrate the following deficits muscle weakness, decreased coordination and decreased motor planning, decreased attention to right, decreased initiation, decreased attention and decreased awareness and decreased standing balance, decreased postural control and hemiplegia and therefore will continue to benefit from skilled PT intervention to increase functional independence with mobility.  Patient not progressing toward long term goals.  See goal revision..  Plan of care revisions: pt's goals have been downgraded to a mod assist level secondary to poor particpation and awareness. .  PT Short Term Goals Week 2:  PT Short Term Goal 1 (Week 2): Pt will perform bed<>w/c transfer with max assist +1 PT Short Term Goal 1 - Progress (Week 2): Met PT Short Term Goal 2 (Week 2): Pt will ambulate x 30 ft with max assist +1 and LRAD PT Short Term Goal 2 - Progress (Week 2): Progressing toward goal PT Short Term Goal 3 (Week 2): Pt will initiate w/c propulsion  PT Short Term Goal 3 - Progress (Week 2): Met Week 3:  PT Short Term Goal 1 (Week 3): STG=LTG   Skilled  Therapeutic Interventions/Progress Updates:    Pt supine in bed upon PT arrival, agreeable to therapy tx and denies pain. Pt requests coffee this AM, agreeable to get into w/c first. Pt transferred from supine>sitting EOB with mod assist using bed rails. Pt able to maintain sitting balance with CGA while MD performs exam. Pt performed stand pivot transfer from bed>TIS w/c with max assist +1 towards the L side, verbal cues for techniques. Pt incontinent of bladder. Pt performed sit<>stands within stedy with Mod assist +1, able to maintain standing balance with SBA while therapist performed clothing management, verbal/tactile cues to correct R lateral lean. Pt seated in TIS w/c worked on R attention in order to grasp coffee cup from therapist. Pt agreeable to doff gown and don clothing. Therapist looped LEs through pants, pt able to activate R quads/hip flexors to assist in lifting R leg. Pt performed sit<>stand with mod assist and L UE support, therapist pulled pants over hips. Pt doffs gown and dons T-shirt with assist to loop R UE through sleeve. Pt transported to the gym. Pt performs x 2 sit<>stands at the L handrail with mod assist but refuses to ambulate this session, reports "Carla Bauer don't feel right." Pt states she thinks she has to have a BM. Pt transported back to room. Stand pivot transfer from TIS<>toilet with max assist +2 for clothing management, pt does not have BM. Transported back to w/c and left seated in TIS with needs in reach, QRB in place and chair alarm set.   Therapy Documentation Precautions:  Precautions Precautions: Fall Restrictions Weight Bearing Restrictions: No  See Function Navigator for Current Functional Status.  Therapy/Group: Individual Therapy  Netta Corrigan, PT, DPT 09/18/2017, 7:46 AM

## 2017-09-18 NOTE — Progress Notes (Signed)
Speech Language Pathology Daily Session Note  Patient Details  Name: Carla Bauer MRN: [id number] Date of Birth: [birth year]  Today's Date: 09/18/2017 SLP Individual Time: [id number] SLP Individual Time Calculation (min): 45 min  Short Term Goals: Week 2: SLP Short Term Goal 1 (Week 2): Pt will verbally respond to basic, personally, relevant questions in 25% of opportunities with max assist multimodal cues.  SLP Short Term Goal 2 (Week 2): Pt will follow 1 step commands during basic, structured tasks for 75% accuracy with max assist multimodal cues.  SLP Short Term Goal 3 (Week 2): Pt will focus her attention to a targeted stimulus in 75% of opportunities with max assist multimodal cues.   SLP Short Term Goal 4 (Week 2): Pt will locate items at midline in 50% of opportunities during basic, structured tasks with max assist multimodal cues.  SLP Short Term Goal 5 (Week 2): Pt will consume dys 1 textures and nectar thick liquids with max assist multimodal cues for use of swallowing precautions and no overt s/s of aspiration.   Skilled Therapeutic Interventions: Skilled treatment session focused on dysphagia and cognitive goals. SLP facilitated session by providing Max A verbal cues for initiation with self-feeding. Patient consumed Dys. 1 textures with nectar-thick liquids without overt s/s of aspiration but required max A verbal cues for use of small sips and to self-monitor and correct right anterior spillage. Patient required total A for orientation to place and situation and for sustained attention to self-feeding for ~60 second intervals. However, patient verbally responded to ~90% of yes/no questions appropriately throughout the session. Patient left upright in bed with all needs within reach and alarm on. Continue with current plan of care.      Function:  Eating Eating   Modified Consistency Diet: Yes Eating Assist Level: Helper scoops food on utensil;Set up assist for;More than  reasonable amount of time;Supervision or verbal cues   Eating Set Up Assist For: Opening containers;Cutting food       Cognition Comprehension Comprehension assist level: Understands basic 50 - 74% of the time/ requires cueing 25 - 49% of the time  Expression   Expression assist level: Expresses basic 25 - 49% of the time/requires cueing 50 - 75% of the time. Uses single words/gestures.  Social Interaction Social Interaction assist level: Interacts appropriately 25 - 49% of time - Needs frequent redirection.  Problem Solving Problem solving assist level: Solves basic 25 - 49% of the time - needs direction more than half the time to initiate, plan or complete simple activities  Memory Memory assist level: Recognizes or recalls 25 - 49% of the time/requires cueing 50 - 75% of the time    Pain No/Denies Pain   Therapy/Group: Individual Therapy  Jahniya Duzan 09/18/2017, 3:58 PM

## 2017-09-19 ENCOUNTER — Inpatient Hospital Stay (HOSPITAL_COMMUNITY): Payer: Self-pay | Admitting: Occupational Therapy

## 2017-09-19 ENCOUNTER — Inpatient Hospital Stay (HOSPITAL_COMMUNITY): Payer: Medicaid Other | Admitting: Speech Pathology

## 2017-09-19 ENCOUNTER — Inpatient Hospital Stay (HOSPITAL_COMMUNITY): Payer: Self-pay | Admitting: Physical Therapy

## 2017-09-19 LAB — GLUCOSE, CAPILLARY
GLUCOSE-CAPILLARY: 223 mg/dL — AB (ref 65–99)
GLUCOSE-CAPILLARY: 101 mg/dL — AB (ref 65–99)
GLUCOSE-CAPILLARY: 251 mg/dL — AB (ref 65–99)
Glucose-Capillary: 113 mg/dL — ABNORMAL HIGH (ref 65–99)

## 2017-09-19 NOTE — Progress Notes (Signed)
Speech Language Pathology Weekly Progress and Session Note  Patient Details  Name: Carla Bauer MRN: [id number] Date of Birth: 02-26-1956  Beginning of progress report period: September 11, 2017 End of progress report period: September 19, 2017  Today's Date: 09/19/2017 SLP Individual Time: 1300-1340 SLP Individual Time Calculation (min): 40 min  Short Term Goals: Week 2: SLP Short Term Goal 1 (Week 2): Pt will verbally respond to basic, personally, relevant questions in 25% of opportunities with max assist multimodal cues.  SLP Short Term Goal 1 - Progress (Week 2): Met SLP Short Term Goal 2 (Week 2): Pt will follow 1 step commands during basic, structured tasks for 75% accuracy with max assist multimodal cues.  SLP Short Term Goal 2 - Progress (Week 2): Met SLP Short Term Goal 3 (Week 2): Pt will focus her attention to a targeted stimulus in 75% of opportunities with max assist multimodal cues.   SLP Short Term Goal 3 - Progress (Week 2): Met SLP Short Term Goal 4 (Week 2): Pt will locate items at midline in 50% of opportunities during basic, structured tasks with max assist multimodal cues.  SLP Short Term Goal 4 - Progress (Week 2): Met SLP Short Term Goal 5 (Week 2): Pt will consume dys 1 textures and nectar thick liquids with max assist multimodal cues for use of swallowing precautions and no overt s/s of aspiration.  SLP Short Term Goal 5 - Progress (Week 2): Met    New Short Term Goals: Week 3: SLP Short Term Goal 1 (Week 3): Patient will consume trials of thin liquids via tsp/cup with minimal overt s/s of aspiration with Mod A verbal cues over 2 sessions to assess readiness for repeat MBS.  SLP Short Term Goal 2 (Week 3): Patient will consume dys 1 textures and nectar thick liquids with Mod A multimodal cues for use of swallowing precautions and no overt s/s of aspiration.  SLP Short Term Goal 3 (Week 3): Patient will orient to place and situation with Max A multimodal cues.  SLP Short  Term Goal 4 (Week 3): Pt will locate items at midline in 75% of opportunities during basic, structured tasks Mod A multimodal cues.  SLP Short Term Goal 5 (Week 3): Patient will demonstrate sustained attention to functional tasks for 5 minutes with Max A verbal cues for redirection.  SLP Short Term Goal 6 (Week 3): Patient will initiate functional tasks with Mod A verbal cues.   Weekly Progress Updates: Patient has made functional gains and has met 5 of 5 STG's this reporting period due to improved participation. Currently, patient is consuming Dys. 1 textures with nectar-thick liquids without overt s/s of aspiration but requires Max-Total A multimodal cues for use of small bites/sips and to self-monitor right anterior spillage. Patient continues to demonstrate language of confusion with decreased speech intelligibility. Patient also requires Max-Total A for sustained attention, orientation, initiation, recall, problem solving and awareness with functional and familiar tasks. Patient and family education is ongoing. Patient would benefit from continued skilled SLP intervention to maximize her cognitive-linguistic and swallowing function prior to discharge.      Intensity: Minumum of 1-2 x/day, 30 to 90 minutes Frequency: 3 to 5 out of 7 days Duration/Length of Stay: TBD due to possible SNF placement  Treatment/Interventions: Cognitive remediation/compensation;Cueing hierarchy;Dysphagia/aspiration precaution training;Functional tasks;Environmental controls;Internal/external aids;Patient/family education;Speech/Language facilitation;Multimodal communication approach   Daily Session  Skilled Therapeutic Interventions: Skilled treatment session focused on dysphagia and cognitive goals. SLP facilitated session by providing Mod A  verbal cues for initiation with self-feeding. Patient consumed Dys. 1 textures with nectar-thick liquids without overt s/s of aspiration but required max A verbal cues for use of  small bites and sips and to self-monitor and correct right anterior spillage. Patient required total A for orientation to place and situation and required Mod A verbal cues for sustained attention to self-feeding for ~2 minute intervals. Patient required increased cueing today to locate items on tray at midline. Patient also with language of confusion throughout session. Patient left upright in wheelchair at RN station. Continue with current plan of care.            Function:   Eating Eating   Modified Consistency Diet: Yes Eating Assist Level: Helper scoops food on utensil;Set up assist for;More than reasonable amount of time;Supervision or verbal cues           Cognition Comprehension Comprehension assist level: Understands basic 50 - 74% of the time/ requires cueing 25 - 49% of the time  Expression   Expression assist level: Expresses basic 25 - 49% of the time/requires cueing 50 - 75% of the time. Uses single words/gestures.  Social Interaction Social Interaction assist level: Interacts appropriately 25 - 49% of time - Needs frequent redirection.  Problem Solving Problem solving assist level: Solves basic 25 - 49% of the time - needs direction more than half the time to initiate, plan or complete simple activities  Memory Memory assist level: Recognizes or recalls 25 - 49% of the time/requires cueing 50 - 75% of the time   Pain No/Denies Pain   Therapy/Group: Individual Therapy  Joi Leyva 09/19/2017, 3:20 PM

## 2017-09-19 NOTE — Progress Notes (Signed)
Occupational Therapy Session Note  Patient Details  Name: Carla Bauer MRN: [id number] Date of Birth: 1955/10/08  Today's Date: 09/19/2017 OT Individual Time: 0915-1000 OT Individual Time Calculation (min): 45 min    Short Term Goals: Week 3:  OT Short Term Goal 1 (Week 3): STG=LTG 2/2 ELOS  Skilled Therapeutic Interventions/Progress Updates:    Pt greeted asleep in bed, easy to wake, but refused to get OOB with OT. OT collected pt clothing and encouraged her to get up and get ready for the day. Pt continued to refuse. Asked pt if she would get up in 15 mins and she said "yes" OT returned after 15 mins and pt more agreeable. Pt came to sitting EOB with assistance to advance R LE and to elevate trunk. Dressing completed seated EOB with overall supervision for static siting and min A for dynnamic sitting within BADLs. Incorporated R visual scanning to midline to locate clothing w/ max multimodal cues to initiate head turn toward R. Max multimodal cues to dress R side of body. Sit><stand without Ad with max A and +2 assisted with pulling pants over hips. Pt then pivoted over to wc on L side with mod/max A. Utilized Sara Plus for sit<>stand and ambulation today to decrease therapist tactile interaction with pt as this seems to agitate her more. Pt needed assistance to advance R LE throughout ambulation but was able to tolerate 3741ft with waist sling on. Pt returned to room and left seated in TIS wc with safety belt and call bell in reach.  Therapy Documentation Precautions:  Precautions Precautions: Fall Restrictions Weight Bearing Restrictions: No Pain: Pain Assessment Pain Scale: 0-10 Pain Score: 0-No pain ADL: ADL ADL Comments: Please see functional navigator See Function Navigator for Current Functional Status.   Therapy/Group: Individual Therapy  Mal Amabilelisabeth S Esiquio Boesen 09/19/2017, 10:02 AM

## 2017-09-19 NOTE — Plan of Care (Signed)
  Problem: RH BOWEL ELIMINATION Goal: RH STG MANAGE BOWEL WITH ASSISTANCE Description STG Manage Bowel with Assistance. Mod  Outcome: Progressing   Problem: RH BLADDER ELIMINATION Goal: RH STG MANAGE BLADDER WITH ASSISTANCE Description STG Manage Bladder With Assistance. Mod  Outcome: Progressing Goal: RH STG MANAGE BLADDER WITH EQUIPMENT WITH ASSISTANCE Description STG Manage Bladder With Equipment With Assistance. MOD  Outcome: Progressing   Problem: RH COGNITION-NURSING Goal: RH STG USES MEMORY AIDS/STRATEGIES W/ASSIST TO PROBLEM SOLVE Description STG Uses Memory Aids/Strategies With mod  Assistance to Problem Solve. Cues and supervision   Outcome: Progressing   Problem: Consults Goal: RH STROKE PATIENT EDUCATION Description See Patient Education module for education specifics  Outcome: Progressing   

## 2017-09-19 NOTE — Progress Notes (Signed)
Physical Therapy Session Note  Patient Details  Name: Carla Bauer MRN: [id number] Date of Birth: [birth year]  Today's Date: 09/19/2017 PT Individual Time: 1115-1200 AND 1430-1500 PT Individual Time Calculation (min): 45 min AND 30 min  Short Term Goals: Week 3:  PT Short Term Goal 1 (Week 3): STG=LTG   Skilled Therapeutic Interventions/Progress Updates:   Session 1:  Pt in TIS and agreeable to therapy, no c/o pain. Conversed w/ pt for a few minutes prior to leaving room in order to build trust w/ this therapist. Transferred to manual w/c to work on self-propelling. Mod-max verbal, visual, and tactile cues for successful propulsion via L hemi technique, 100' overall w/ increased time 2/2 distracting hallway environment. Pt able to self-correct when running into obstacles on R side. Provided skilled assist w/ drinking cup of thickened coffee, working on attention to R environment and attention to functional task in distracting hospital environment. Min-mod cues for increased attention. Ended session at nurses station in manual w/c, RN agreeable to provide supervision.   Session 2:  Pt in TIS and agreeable to therapy, no c/o pain but stating "she need to go to the toilet". Max cues to verbalize request in first person, but continued to state the same thing. Performed toilet transfer to/from toilet w/ mod assist overall in each direction w/ total assist for pericare and LE garments. Pt w/ continent void. Max-total assist to wash hands at sink w/ min cues to initiate task. Pt agreeable to sit up in w/c, ended session in manual chair, call bell within reach and all needs met. Telesitter present, chair alarm and quick release belt engaged, and w/c arm table placed on w/c.   Therapy Documentation Precautions:  Precautions Precautions: Fall Restrictions Weight Bearing Restrictions: No Vital Signs: Therapy Vitals Pulse Rate: 97 Resp: 17 BP: 113/78 Patient Position (if appropriate):  Sitting Oxygen Therapy SpO2: 100 % O2 Device: Room Air  See Function Navigator for Current Functional Status.   Therapy/Group: Individual Therapy  Nakayla Rorabaugh K Arnette 09/19/2017, 3:12 PM

## 2017-09-19 NOTE — Progress Notes (Signed)
Assessed pt PEG tube site this morning, had new drainage mininmal dark red bloodish drainage noted. drsg changed, reassessed this evening and drsg had minimal drainiage again. drsg changed. No pain noted. Slight tenderness noted. Placed in bed with HOB elevated. Call bell in reach. Bed alarm in place. Side rails up x 4. Telesitter in place. Will cont to monitor.  Salomon FickK Caeleigh Prohaska, LPN

## 2017-09-19 NOTE — Progress Notes (Addendum)
Subjective/Complaints:  Appears cooperative today  ROS: Limited due to cognitive/behavioral   Objective: Vital Signs: Blood pressure 113/78, pulse 97, temperature 98 F (36.7 C), temperature source Oral, resp. rate 17, height 5\' 1"  (1.549 m), weight 65.2 kg (143 lb 11.8 oz), SpO2 100 %. No results found. Results for orders placed or performed during the hospital encounter of 09/03/17 (from the past 72 hour(s))  Glucose, capillary     Status: Abnormal   Collection Time: 09/16/17  5:03 PM  Result Value Ref Range   Glucose-Capillary 149 (H) 65 - 99 mg/dL  Glucose, capillary     Status: Abnormal   Collection Time: 09/16/17  7:35 PM  Result Value Ref Range   Glucose-Capillary 59 (L) 65 - 99 mg/dL  Glucose, capillary     Status: Abnormal   Collection Time: 09/16/17  8:02 PM  Result Value Ref Range   Glucose-Capillary 146 (H) 65 - 99 mg/dL  Glucose, capillary     Status: Abnormal   Collection Time: 09/16/17  9:35 PM  Result Value Ref Range   Glucose-Capillary 184 (H) 65 - 99 mg/dL  Glucose, capillary     Status: Abnormal   Collection Time: 09/16/17 11:39 PM  Result Value Ref Range   Glucose-Capillary 120 (H) 65 - 99 mg/dL  Glucose, capillary     Status: Abnormal   Collection Time: 09/17/17  3:52 AM  Result Value Ref Range   Glucose-Capillary 105 (H) 65 - 99 mg/dL  Glucose, capillary     Status: Abnormal   Collection Time: 09/17/17  7:39 AM  Result Value Ref Range   Glucose-Capillary 106 (H) 65 - 99 mg/dL  Glucose, capillary     Status: Abnormal   Collection Time: 09/17/17  8:24 AM  Result Value Ref Range   Glucose-Capillary 165 (H) 65 - 99 mg/dL   Comment 1 Notify RN   Glucose, capillary     Status: Abnormal   Collection Time: 09/17/17 11:46 AM  Result Value Ref Range   Glucose-Capillary 164 (H) 65 - 99 mg/dL   Comment 1 Notify RN   Glucose, capillary     Status: Abnormal   Collection Time: 09/17/17  4:26 PM  Result Value Ref Range   Glucose-Capillary 108 (H) 65 - 99  mg/dL  Glucose, capillary     Status: Abnormal   Collection Time: 09/17/17  9:13 PM  Result Value Ref Range   Glucose-Capillary 108 (H) 65 - 99 mg/dL  Glucose, capillary     Status: Abnormal   Collection Time: 09/18/17  6:45 AM  Result Value Ref Range   Glucose-Capillary 112 (H) 65 - 99 mg/dL  Glucose, capillary     Status: Abnormal   Collection Time: 09/18/17 11:31 AM  Result Value Ref Range   Glucose-Capillary 254 (H) 65 - 99 mg/dL   Comment 1 Notify RN   Glucose, capillary     Status: Abnormal   Collection Time: 09/18/17  4:29 PM  Result Value Ref Range   Glucose-Capillary 110 (H) 65 - 99 mg/dL   Comment 1 Notify RN   Glucose, capillary     Status: Abnormal   Collection Time: 09/18/17  9:10 PM  Result Value Ref Range   Glucose-Capillary 199 (H) 65 - 99 mg/dL   Comment 1 Notify RN   Glucose, capillary     Status: Abnormal   Collection Time: 09/19/17  6:39 AM  Result Value Ref Range   Glucose-Capillary 113 (H) 65 - 99 mg/dL  Glucose, capillary  Status: Abnormal   Collection Time: 09/19/17 11:29 AM  Result Value Ref Range   Glucose-Capillary 223 (H) 65 - 99 mg/dL     Constitutional: No distress . Vital signs reviewed. HEENT: EOMI, oral membranes moist Extremity:  No Edema Heart RRR no Murmur Lungs Clear Ext without edema Skin:   Wound well healed incision Left crani, Neuro:confused, distracted.  Aphasic,  abnormal Motor 0/5 RUE and RLE, at least 4/5 on Left side, Abnormal FMC Ataxic/ dec FMC, Dysarthric and Aphasic  Gen NAD   Assessment/Plan: 1. 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 Physiatrist is providing close team supervision and 24 hour management of active medical problems listed below. Physiatrist and rehab team continue to assess barriers to discharge/monitor patient progress toward functional and medical  goals. FIM: Function - Bathing Position: Wheelchair/chair at sink Body parts bathed by patient: Abdomen, Chest, Right arm, Front perineal area, Right upper leg, Left upper leg Body parts bathed by helper: Right lower leg, Left lower leg, Back, Left arm, Buttocks Assist Level: Touching or steadying assistance(Pt > 75%)  Function- Upper Body Dressing/Undressing What is the patient wearing?: Pull over shirt/dress Pull over shirt/dress - Perfomed by patient: Thread/unthread left sleeve, Put head through opening, Pull shirt over trunk Pull over shirt/dress - Perfomed by helper: Thread/unthread right sleeve Assist Level: Touching or steadying assistance(Pt > 75%) Function - Lower Body Dressing/Undressing What is the patient wearing?: Pants Position: Other (comment)(stedy) Pants- Performed by patient: Pull pants up/down Pants- Performed by helper: Pull pants up/down Non-skid slipper socks- Performed by helper: Don/doff right sock, Don/doff left sock Assist for footwear: Partial/moderate assist Assist for lower body dressing: 2 Helpers  Function - Toileting Toileting activity did not occur: No continent bowel/bladder event Toileting steps completed by helper: Adjust clothing prior to toileting, Performs perineal hygiene, Adjust clothing after toileting Assist level: Two helpers  Function - Archivist transfer activity did not occur: Safety/medical concerns Toilet transfer assistive device: Systems developer lift: Stedy Assist level to toilet: 2 helpers Assist level from toilet: 2 helpers  Function - Chair/bed transfer Chair/bed transfer method: Stand pivot Chair/bed transfer assist level: Moderate assist (Pt 50 - 74%/lift or lower) Chair/bed transfer assistive device: Armrests Mechanical lift: Stedy Chair/bed transfer details: Manual facilitation for weight shifting, Manual facilitation for placement, Verbal cues for technique, Verbal cues for  precautions/safety  Function - Locomotion: Wheelchair Will patient use wheelchair at discharge?: Yes Type: Manual Max wheelchair distance: 100' Assist Level: Touching or steadying assistance (Pt > 75%) Assist Level: Touching or steadying assistance (Pt > 75%) Assist Level: Touching or steadying assistance (Pt > 75%) Turns around,maneuvers to table,bed, and toilet,negotiates 3% grade,maneuvers on rugs and over doorsills: No Function - Locomotion: Ambulation Ambulation activity did not occur: Safety/medical concerns Assistive device: Parallel bars Max distance: 5 Assist level: 2 helpers Assist level: 2 helpers  Function - Comprehension Comprehension: Auditory Comprehension assist level: Understands basic 50 - 74% of the time/ requires cueing 25 - 49% of the time  Function - Expression Expression: Verbal Expression assist level: Expresses basic 25 - 49% of the time/requires cueing 50 - 75% of the time. Uses single words/gestures.  Function - Social Interaction Social Interaction assist level: Interacts appropriately 25 - 49% of time - Needs frequent redirection.  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 SLP, 2. DVT Prophylaxis/Anticoagulation: Subcutaneous heparin. LE vascular study negative 3. Pain Management:Tylenol as needed 4. Mood:Provide emotional  support  -Add scheduled Seroquel at night-reduced dose secondary to sedation 12.5  -Add as needed Seroquel for agitation.  Want to avoid oversedation 5. Neuropsych: This patientis notcapable of making decisions on herown behalf. 6. Skin/Wound Care:Routine skin checks 7. Fluids/Electrolytes/Nutrition:Routine in and outs with follow-up chemistries -NGT feedings, adjust rate/H20 as needed- dietary consult PEG tube placed, tolerating bolus feeds 8.Dysphagia. Dysphagia #1 nectar liquids. Nasogastric tube feeds for nutritional support, will need  PEG  Given, p.o. meal intake 25 to 50% 9.Hyperglycemia induced from tube feeds. Continue Lantus insulin for coverage. CBG (last 3)  Recent Labs    09/18/17 2110 09/19/17 0639 09/19/17 1129  GLUCAP 199* 113* 223*  Good range for hospital control 6/20, fluid intake yesterday was 900 mL 10.Hypertension. Lopressor 50 mg twice daily Vitals:   09/19/17 0756 09/19/17 1403  BP: 98/79 113/78  Pulse: (!) 108 97  Resp:  17  Temp:    SpO2: 100% 100%  BP controlled 6/20 with mild sinus tachycardia-lopressor 100mg  BID 11.  Anemia,Hgb 9.2 Baseline Hgb ?11.8, had some post op blood loss,  stool guaic pending, Normal Fe studies 6/10 12.  Agitation due to confusion , balancing control of agitation with sedation  13. Sinus tachy- likely multifactorial anemia, not hypoxic ,6/12 normal BMET Lopressor was increased to 100 mg twice a day on 09/15/2017, continue to monitor affect, HR 97 this pm LOS (Days) 16 A FACE TO FACE EVALUATION WAS PERFORMED  Erick Colace 09/19/2017, 4:13 PM

## 2017-09-20 ENCOUNTER — Inpatient Hospital Stay (HOSPITAL_COMMUNITY): Payer: Medicaid Other | Admitting: Speech Pathology

## 2017-09-20 ENCOUNTER — Inpatient Hospital Stay (HOSPITAL_COMMUNITY): Payer: Self-pay | Admitting: Occupational Therapy

## 2017-09-20 ENCOUNTER — Inpatient Hospital Stay (HOSPITAL_COMMUNITY): Payer: Self-pay | Admitting: Physical Therapy

## 2017-09-20 LAB — GLUCOSE, CAPILLARY
GLUCOSE-CAPILLARY: 210 mg/dL — AB (ref 65–99)
Glucose-Capillary: 82 mg/dL (ref 65–99)
Glucose-Capillary: 83 mg/dL (ref 65–99)
Glucose-Capillary: 141 mg/dL — ABNORMAL HIGH (ref 65–99)

## 2017-09-20 NOTE — Progress Notes (Addendum)
Social Work Patient ID: Carla Bauer, female   DOB: 14-Jun-1955, 62 y.o.   MRN: [id number]  Spoke with [name]-daughter per telephone to discuss team conference progress this week and continued difficulty participating in therapies. MD feels based on discharge plan daughter's or caregivers need to come in and begin learning pt's care and then will discharge her. [name] reports they are taking her home upon discharge form Rehab and do not want to put her in a  NH. Will also contact other daughter Marcelle Smilingatasha. Await return call regarding day and time coming in for education. Work toward discharge next week. Spoke with Natassha-daughter to ask when she can come in for education. She states: " All I have is free time, I will be there everyday."  When asked where she was today she could not answer. She states: " My momma aint going to no nursing home." Will work on education and discharge for next week.

## 2017-09-20 NOTE — Progress Notes (Signed)
Speech Language Pathology Daily Session Note  Patient Details  Name: Carla Bauer MRN: [id number] Date of Birth: [birth year]  Today's Date: 09/20/2017 SLP Individual Time: 0900-0930 SLP Individual Time Calculation (min): 30 min  Short Term Goals: Week 3: SLP Short Term Goal 1 (Week 3): Patient will consume trials of thin liquids via tsp/cup with minimal overt s/s of aspiration with Mod A verbal cues over 2 sessions to assess readiness for repeat MBS.  SLP Short Term Goal 2 (Week 3): Patient will consume dys 1 textures and nectar thick liquids with Mod A multimodal cues for use of swallowing precautions and no overt s/s of aspiration.  SLP Short Term Goal 3 (Week 3): Patient will orient to place and situation with Max A multimodal cues.  SLP Short Term Goal 4 (Week 3): Pt will locate items at midline in 75% of opportunities during basic, structured tasks Mod A multimodal cues.  SLP Short Term Goal 5 (Week 3): Patient will demonstrate sustained attention to functional tasks for 5 minutes with Max A verbal cues for redirection.  SLP Short Term Goal 6 (Week 3): Patient will initiate functional tasks with Mod A verbal cues.   Skilled Therapeutic Interventions:Skilled treatment session focused on dysphagia and cognitive goals. SLP facilitated session by providing Mod A verbal cues for initiation and Max A verbal cues for problem solving with self-feeding. Patient consumed a snack of Dys. 1 textures with nectar-thick liquids without overt s/s of aspiration but required Total A verbal cues for use of small bites and sips and to self-monitor and correct right anterior spillage. Patient required total A for orientation to place and situation and required Mod A verbal cues for sustained attention to self-feeding for ~2 minute intervals. Patient with increased language of confusion throughout session today resulting in increased verbal agitation, however, patient still participated throughout session. Patient  left upright in wheelchair at RN station. Continue with current plan of care.        Function:  Eating Eating   Modified Consistency Diet: Yes Eating Assist Level: Helper scoops food on utensil;Set up assist for;More than reasonable amount of time;Supervision or verbal cues   Eating Set Up Assist For: Opening containers       Cognition Comprehension Comprehension assist level: Understands basic 50 - 74% of the time/ requires cueing 25 - 49% of the time  Expression   Expression assist level: Expresses basic 25 - 49% of the time/requires cueing 50 - 75% of the time. Uses single words/gestures.  Social Interaction Social Interaction assist level: Interacts appropriately 25 - 49% of time - Needs frequent redirection.  Problem Solving Problem solving assist level: Solves basic 25 - 49% of the time - needs direction more than half the time to initiate, plan or complete simple activities  Memory Memory assist level: Recognizes or recalls 25 - 49% of the time/requires cueing 50 - 75% of the time    Pain No/Denies Pain   Therapy/Group: Individual Therapy  Mackayla Mullins 09/20/2017, 11:22 AM

## 2017-09-20 NOTE — Plan of Care (Signed)
  Problem: RH BOWEL ELIMINATION Goal: RH STG MANAGE BOWEL WITH ASSISTANCE Description STG Manage Bowel with Assistance. Mod  Outcome: Progressing   Problem: RH BLADDER ELIMINATION Goal: RH STG MANAGE BLADDER WITH ASSISTANCE Description STG Manage Bladder With Assistance. Mod  Outcome: Progressing Goal: RH STG MANAGE BLADDER WITH EQUIPMENT WITH ASSISTANCE Description STG Manage Bladder With Equipment With Assistance. MOD  Outcome: Progressing   Problem: RH COGNITION-NURSING Goal: RH STG USES MEMORY AIDS/STRATEGIES W/ASSIST TO PROBLEM SOLVE Description STG Uses Memory Aids/Strategies With mod  Assistance to Problem Solve. Cues and supervision   Outcome: Progressing   Problem: Consults Goal: RH STROKE PATIENT EDUCATION Description See Patient Education module for education specifics  Outcome: Progressing   

## 2017-09-20 NOTE — Progress Notes (Signed)
Occupational Therapy Session Note  Patient Details  Name: Carla Bauer MRN: [id number] Date of Birth: 04/04/1955  Today's Date: 09/20/2017 OT Individual Time: 0805-0900 OT Individual Time Calculation (min): 55 min   Short Term Goals: Week 3:  OT Short Term Goal 1 (Week 3): STG=LTG 2/2 ELOS  Skilled Therapeutic Interventions/Progress Updates:    Pt greeted semi-reclined in bed and agreeable to OT. Pt more cooperative this morning and easier to re-direct. Pt came to sitting EOB with mod A. Stand-pivot bed>wc with mod A of 1. Pt brought to the sink to change brief and don pants. Pt had difficulty standing from lower wc, requiring max A to power up into standing.  OT facilitated weight bearing through R UE while standing for neuro re-ed. Pt stood for 2 mins with min/mod A while OT changed brief. Assisted pts  R LE into figure 4 position, then pt needed max verbal cues to initiate threading pants over R leg. She was then able to initiate thread LLE without verbal cues. Sit<>stand in similar fashion as before. Worked on R attention and wc propulsion to get to laundry room. Max multimodal cues to avoid objects on R side. Sit<>stand in laundry room with max A, then engaged pt in task at midline to put clothes into washer while standing. OT provided gently massage and ROM to R UE for flexor tone, then engaged pt in self-ROM task which she was able to look at her R arm slightly past midline to the R. Pt left seated in wc at end of session with safety belt on and needs met.   Therapy Documentation Precautions:  Precautions Precautions: Fall Restrictions Weight Bearing Restrictions: No Pain:  none/denies pain ADL: ADL ADL Comments: Please see functional navigator  See Function Navigator for Current Functional Status.   Therapy/Group: Individual Therapy  Valma Cava 09/20/2017, 8:57 AM

## 2017-09-20 NOTE — Progress Notes (Signed)
Physical Therapy Session Note  Patient Details  Name: Carla Bauer MRN: [id number] Date of Birth: [birth year]  Today's Date: 09/20/2017 PT Individual Time: 1100-1157 PT Individual Time Calculation (min): 57 min   Short Term Goals: Week 3:  PT Short Term Goal 1 (Week 3): STG=LTG   Skilled Therapeutic Interventions/Progress Updates:   Pt in w/c and no c/o pain. Session focused on functional mobility including w/c locomotion and gait. 2nd helper present for encouragement and pt participation. Pt self-propelled w/c w/ min assist to therapy gym using L hemi technique. Ambulated at rail 30' x3 bouts w/ seated rest in between. Mod assist 1st gait bout for RLE management and facilitation of lateral weight shifting. Otherwise mod fading to min assist during last gait bout w/ assist only needed for RLE placement. Added ace wrap for DF assist w/ improved R swing limb advancement as well. Ended session in w/c and in care of NT at nurses station, all needs met.   Therapy Documentation Precautions:  Precautions Precautions: Fall Restrictions Weight Bearing Restrictions: No Vital Signs: Therapy Vitals Pulse Rate: (!) 110 BP: 116/80  See Function Navigator for Current Functional Status.   Therapy/Group: Individual Therapy  Shalaine Payson K Arnette 09/20/2017, 12:00 PM

## 2017-09-21 ENCOUNTER — Inpatient Hospital Stay (HOSPITAL_COMMUNITY): Payer: Self-pay | Admitting: Occupational Therapy

## 2017-09-21 ENCOUNTER — Inpatient Hospital Stay (HOSPITAL_COMMUNITY): Payer: Self-pay | Admitting: Physical Therapy

## 2017-09-21 LAB — GLUCOSE, CAPILLARY
GLUCOSE-CAPILLARY: 219 mg/dL — AB (ref 65–99)
Glucose-Capillary: 88 mg/dL (ref 65–99)
Glucose-Capillary: 102 mg/dL — ABNORMAL HIGH (ref 65–99)
Glucose-Capillary: 112 mg/dL — ABNORMAL HIGH (ref 65–99)

## 2017-09-21 NOTE — Plan of Care (Signed)
  Problem: RH BOWEL ELIMINATION Goal: RH STG MANAGE BOWEL WITH ASSISTANCE Description STG Manage Bowel with Assistance. Mod  Outcome: Progressing   Problem: RH BLADDER ELIMINATION Goal: RH STG MANAGE BLADDER WITH ASSISTANCE Description STG Manage Bladder With Assistance. Mod  Outcome: Progressing Goal: RH STG MANAGE BLADDER WITH EQUIPMENT WITH ASSISTANCE Description STG Manage Bladder With Equipment With Assistance. MOD  Outcome: Progressing   Problem: RH COGNITION-NURSING Goal: RH STG USES MEMORY AIDS/STRATEGIES W/ASSIST TO PROBLEM SOLVE Description STG Uses Memory Aids/Strategies With mod  Assistance to Problem Solve. Cues and supervision   Outcome: Progressing   Problem: Consults Goal: RH STROKE PATIENT EDUCATION Description See Patient Education module for education specifics  Outcome: Progressing   

## 2017-09-21 NOTE — Progress Notes (Signed)
Occupational Therapy Session Note  Patient Details  Name: Carla Bauer MRN: [id number] Date of Birth: 11/29/1955  Today's Date: 09/21/2017 OT Individual Time: 0800-0900 OT Individual Time Calculation (min): 60 min    Short Term Goals: Week 2:  OT Short Term Goal 1 (Week 2): Pt will sit EOB with no more than mod A while participating in BADL task OT Short Term Goal 1 - Progress (Week 2): Met OT Short Term Goal 2 (Week 2): Pt will locate 1/4 grooming items at the sink with mod instructional cues OT Short Term Goal 2 - Progress (Week 2): Progressing toward goal OT Short Term Goal 3 (Week 2): Pt will complete toilet transfer with max +2 assist OT Short Term Goal 3 - Progress (Week 2): Met Week 3:  OT Short Term Goal 1 (Week 3): STG=LTG 2/2 ELOS  Skilled Therapeutic Interventions/Progress Updates:    1:1 Pt starting breakfast when arrived. Engaged in social conversation while eat with focus on visually attending to the left of tray/ plate with moderate environmental cues.  Then agreeable to coming to EOB with min A with extra time. Pt agreeable to showering today.  Transitioned into sitting on STEDY and showering on STEDY with focus on maintaining static to dynamic sitting balance and sit to stands (with bar on STEDY needing only min to mod A). Pt did not want to engage in washing herself but did allow being washed by therapist. Transitioned into w/c to engage in dressing using hemi bathing technique  With mod to max cues for attention to left side. Sit to stands from w/c with mod A to pull up pants with total A. Pt engaged in helping fix a cup of coffee with max A with focus on visually attending to left side.    2nd session 1:1  13:45-1415 Pt in good spirits and willing to come with this therapist to go to the gym to "work".  Pt performed max A transfer from low regular w/c to the mat and mod A to get into supine position. Pt participatory in transfer but still presents with strong posterior  lean. In supine pt allowed AAROM/ stretching of right UE. Pt able to actively  relax tone at the shoulder and bicep and be able to perform some active shoulder adduction aduction , bicep flexion, initiation of triceps extension.   Recommend a rest hand splint for night- pt's with tone- 3 in the wrist    Pt able to perform 12 reps of bridging on the mat and then 12 reps of hip adduction with ball squeezes.  Mat A to return to sitting position and max A transfer to the right back into her w/c. Left sitting up in w/c in her room with telesitter and safety belt.   Therapy Documentation Precautions:  Precautions Precautions: Fall Restrictions Weight Bearing Restrictions: No Pain:  no c/o pain in session  ADL: ADL ADL Comments: Please see functional navigator  See Function Navigator for Current Functional Status.   Therapy/Group: Individual Therapy  ,  Lynsey 09/21/2017, 12:49 PM 

## 2017-09-21 NOTE — Progress Notes (Signed)
Occupational Therapy Session Note  Patient Details  Name: Arvid RightDorothy A Dimaria MRN: [id number] Date of Birth: 1955/06/28  Today's Date: 09/21/2017 OT Group Time: 1101-1201 OT Group Time Calculation (min): 60 min   Skilled Therapeutic Interventions/Progress Updates:    Pt participated in therapeutic w/c level dance group with focus on UE/LE strengthening, activity tolerance, and social participation for carryover during self care tasks. Pt was guided through various dance-based exercises involving UB/LB and trunk. Pt initially required time to get warmed up to group. Then she requested a few song artists, and sang along when playing The Temptations or [place]. Pt held hands with group members and swayed while smiling x3 songs. She interacted with members who spoke to her. Pt did not attend to exercise instruction, however tapped feet and moved UEs on her own for patient-directed tx. At end of tx she was escorted back to room via RT.    Therapy Documentation Precautions:  Precautions Precautions: Fall Restrictions Weight Bearing Restrictions: No ADL: ADL ADL Comments: Please see functional navigator     See Function Navigator for Current Functional Status.   Therapy/Group: Group Therapy  Rhayne Chatwin A Valbona Slabach 09/21/2017, 12:52 PM

## 2017-09-21 NOTE — Progress Notes (Signed)
Subjective/Complaints: "waitin on Home [employer]ot[employer]"  Pt could not explain anything about this statement.  Also talking to persons not in room  ROS: Limited due to cognitive/behavioral   Objective: Vital Signs: Blood pressure 110/85, pulse (!) 106, temperature 98.3 F (36.8 C), temperature source Oral, resp. rate 18, height 5\' 1"  (1.549 m), weight 63.2 kg (139 lb 5.3 oz), SpO2 100 %. No results found. Results for orders placed or performed during the hospital encounter of 09/03/17 (from the past 72 hour(s))  Glucose, capillary     Status: Abnormal   Collection Time: 09/18/17 11:31 AM  Result Value Ref Range   Glucose-Capillary 254 (H) 65 - 99 mg/dL   Comment 1 Notify RN   Glucose, capillary     Status: Abnormal   Collection Time: 09/18/17  4:29 PM  Result Value Ref Range   Glucose-Capillary 110 (H) 65 - 99 mg/dL   Comment 1 Notify RN   Glucose, capillary     Status: Abnormal   Collection Time: 09/18/17  9:10 PM  Result Value Ref Range   Glucose-Capillary 199 (H) 65 - 99 mg/dL   Comment 1 Notify RN   Glucose, capillary     Status: Abnormal   Collection Time: 09/19/17  6:39 AM  Result Value Ref Range   Glucose-Capillary 113 (H) 65 - 99 mg/dL  Glucose, capillary     Status: Abnormal   Collection Time: 09/19/17 11:29 AM  Result Value Ref Range   Glucose-Capillary 223 (H) 65 - 99 mg/dL  Glucose, capillary     Status: Abnormal   Collection Time: 09/19/17  4:44 PM  Result Value Ref Range   Glucose-Capillary 101 (H) 65 - 99 mg/dL  Glucose, capillary     Status: Abnormal   Collection Time: 09/19/17  9:38 PM  Result Value Ref Range   Glucose-Capillary 251 (H) 65 - 99 mg/dL  Glucose, capillary     Status: None   Collection Time: 09/20/17  6:41 AM  Result Value Ref Range   Glucose-Capillary 82 65 - 99 mg/dL  Glucose, capillary     Status: Abnormal   Collection Time: 09/20/17 12:17 PM  Result Value Ref Range   Glucose-Capillary 210 (H) 65 - 99 mg/dL  Glucose, capillary     Status: None   Collection Time: 09/20/17  4:56 PM  Result Value Ref Range   Glucose-Capillary 83 65 - 99 mg/dL  Glucose, capillary     Status: Abnormal   Collection Time: 09/20/17  9:28 PM  Result Value Ref Range   Glucose-Capillary 141 (H) 65 - 99 mg/dL  Glucose, capillary     Status: None   Collection Time: 09/21/17  6:45 AM  Result Value Ref Range   Glucose-Capillary 88 65 - 99 mg/dL     Constitutional: No distress . Vital signs reviewed. HEENT: EOMI, oral membranes moist Extremity:  No Edema Heart RRR no Murmur Lungs Clear Ext without edema Abd no pain to palpation  Skin:   Wound well healed incision Left crani, Neuro:confused, distracted.  Aphasic,  abnormal Motor 0/5 RUE and RLE, at least 4/5 on Left side, Abnormal FMC Ataxic/ dec FMC, Dysarthric and Aphasic  Gen NAD   Assessment/Plan: 1. 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 Physiatrist is providing close team supervision and 24 hour management of active medical problems listed below. Physiatrist and rehab team continue to assess barriers to  discharge/monitor patient progress toward functional and medical goals. FIM: Function - Bathing Position: Wheelchair/chair at sink Body parts bathed by patient: Abdomen, Chest, Right arm, Front perineal area, Right upper leg, Left upper leg Body parts bathed by helper: Right lower leg, Left lower leg, Back, Left arm, Buttocks Assist Level: Touching or steadying assistance(Pt > 75%)  Function- Upper Body Dressing/Undressing What is the patient wearing?: Pull over shirt/dress Pull over shirt/dress - Perfomed by patient: Thread/unthread left sleeve, Put head through opening, Pull shirt over trunk Pull over shirt/dress - Perfomed by helper: Thread/unthread right sleeve Assist Level: Touching or steadying assistance(Pt > 75%) Function - Lower Body  Dressing/Undressing What is the patient wearing?: Pants Position: Wheelchair/chair at sink Pants- Performed by patient: Thread/unthread left pants leg Pants- Performed by helper: Thread/unthread right pants leg, Pull pants up/down Non-skid slipper socks- Performed by helper: Don/doff right sock, Don/doff left sock Assist for footwear: Partial/moderate assist Assist for lower body dressing: Touching or steadying assistance (Pt > 75%)  Function - Toileting Toileting activity did not occur: No continent bowel/bladder event Toileting steps completed by helper: Adjust clothing prior to toileting, Performs perineal hygiene, Adjust clothing after toileting Assist level: Two helpers  Function - Archivist transfer activity did not occur: Safety/medical concerns Toilet transfer assistive device: Systems developer lift: Stedy Assist level to toilet: 2 helpers Assist level from toilet: 2 helpers  Function - Chair/bed transfer Chair/bed transfer method: Stand pivot Chair/bed transfer assist level: Moderate assist (Pt 50 - 74%/lift or lower) Chair/bed transfer assistive device: Armrests Mechanical lift: Stedy Chair/bed transfer details: Manual facilitation for weight shifting, Manual facilitation for placement, Verbal cues for technique, Verbal cues for precautions/safety  Function - Locomotion: Wheelchair Will patient use wheelchair at discharge?: Yes Type: Manual Max wheelchair distance: 100' Assist Level: Touching or steadying assistance (Pt > 75%) Assist Level: Touching or steadying assistance (Pt > 75%) Assist Level: Touching or steadying assistance (Pt > 75%) Turns around,maneuvers to table,bed, and toilet,negotiates 3% grade,maneuvers on rugs and over doorsills: No Function - Locomotion: Ambulation Ambulation activity did not occur: Safety/medical concerns Assistive device: Rail in hallway Max distance: 30' Assist level: Moderate assist (Pt 50 - 74%) Assist  level: Moderate assist (Pt 50 - 74%)  Function - Comprehension Comprehension: Auditory Comprehension assist level: Understands basic 50 - 74% of the time/ requires cueing 25 - 49% of the time  Function - Expression Expression: Verbal Expression assist level: Expresses basic 25 - 49% of the time/requires cueing 50 - 75% of the time. Uses single words/gestures.  Function - Social Interaction Social Interaction assist level: Interacts appropriately 25 - 49% of time - Needs frequent redirection.  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 SLP, 2. DVT Prophylaxis/Anticoagulation: Subcutaneous heparin. LE vascular study negative 3. Pain Management:Tylenol as needed 4. Mood:Provide emotional support  -Add scheduled Seroquel at night-reduced dose secondary to sedation 12.5  -Add as needed  Seroquel for agitation.  Want to avoid oversedation 5. Neuropsych: This patientis notcapable of making decisions on herown behalf. 6. Skin/Wound Care:Routine skin checks 7. Fluids/Electrolytes/Nutrition:Routine in and outs with follow-up chemistries -NGT feedings, adjust rate/H20 as needed- dietary consult PEG tube placed, tolerating bolus feeds,no tenderness around tube 8.Dysphagia. Dysphagia #1 nectar liquids. Nasogastric tube feeds  for nutritional support, will need  PEG  Given, p.o. meal intake 25 to 50% 9.Hyperglycemia induced from tube feeds. Continue Lantus insulin for coverage. CBG (last 3)  Recent Labs    09/20/17 1656 09/20/17 2128 09/21/17 0645  GLUCAP 83 141* 88  Good range for hospital control 6/22, fluid intake yesterday was 900 mL 10.Hypertension. Lopressor 50 mg twice daily Vitals:   09/20/17 2131 09/21/17 0607  BP: (!) 144/82 110/85  Pulse: (!) 116 (!) 106  Resp:  18  Temp:  98.3 F (36.8 C)  SpO2:  100%  BP controlled 6/22 with mild sinus tachycardia-lopressor 100mg  BID 11.  Anemia,Hgb 9.2 Baseline Hgb ?11.8, had some post op blood loss,  stool guaic pending pt incont, Normal Fe studies 6/10 12.  Agitation due to confusion , balancing control of agitation with sedation  13. Sinus tachy- likely multifactorial anemia, not hypoxic ,6/12 normal BMET Lopressor was increased to 100 mg twice a day on 09/15/2017,  LOS (Days) 18 A FACE TO FACE EVALUATION WAS PERFORMED  Erick Colace 09/21/2017, 9:21 AM

## 2017-09-21 NOTE — Progress Notes (Signed)
Physical Therapy Session Note  Patient Details  Name: Carla Bauer MRN: [id number] Date of Birth: 1956-02-07  Today's Date: 09/21/2017 PT Individual Time: 7858-8502 PT Individual Time Calculation (min): 50 min   Short Term Goals: Week 3:  PT Short Term Goal 1 (Week 3): STG=LTG   Skilled Therapeutic Interventions/Progress Updates:   Pt in TIS and pleasantly interacting w/ therapist this morning. Total assist w/c transport to day room. Performed NuStep to work on reciprocal movement pattern, R body/environmental awareness, and attention to task. Pt able to attend to task for multiple 2-3 min bouts in environment requiring selective attention. Verbal and tactile cues to continue task. Pt most distracted by new people entering environment and her own internal thoughts, pt not orientated to location or situation despite max cues. Brief rest periods, provided w/ sips of coffee. Worked on manual w/c propulsion remainder of session w/ min assist overall using L hemi technique. Max verbal, visual, and tactile cues to attend to R environment and R obstacle avoidance. Pt unable to cross midline w/ eyes and head turns despite cues today. Ended session in manual chair, quick release belt and chair alarm engaged. In care of NT at nurses station, all needs met.   Therapy Documentation Precautions:  Precautions Precautions: Fall Restrictions Weight Bearing Restrictions: No  See Function Navigator for Current Functional Status.   Therapy/Group: Individual Therapy  Amberleigh Gerken K Arnette 09/21/2017, 10:48 AM

## 2017-09-22 ENCOUNTER — Inpatient Hospital Stay (HOSPITAL_COMMUNITY): Payer: Self-pay

## 2017-09-22 ENCOUNTER — Inpatient Hospital Stay (HOSPITAL_COMMUNITY): Payer: Self-pay | Admitting: Physical Therapy

## 2017-09-22 LAB — GLUCOSE, CAPILLARY
GLUCOSE-CAPILLARY: 84 mg/dL (ref 65–99)
GLUCOSE-CAPILLARY: 100 mg/dL — AB (ref 65–99)
Glucose-Capillary: 149 mg/dL — ABNORMAL HIGH (ref 65–99)
Glucose-Capillary: 102 mg/dL — ABNORMAL HIGH (ref 65–99)

## 2017-09-22 NOTE — Progress Notes (Signed)
Physical Therapy Session Note  Patient Details  Name: Arvid RightDorothy A Whirley MRN: [id number] Date of Birth: 12-28-1955  Today's Date: 09/22/2017 PT Individual Time: 0800-0913 PT Individual Time Calculation (min): 73 min   Short Term Goals: Week 3:  PT Short Term Goal 1 (Week 3): STG=LTG   Skilled Therapeutic Interventions/Progress Updates: Pt presented in bed in pleasant disposition and agreeable to therapy. Pt performed rolling L/R to allow PTA to don pants with minA to L and supervision to right with cues. Pt performed supine to sit with use of bed features min to modA. Performed squat pivot transfer maxA to w/c with pt able to follow cues for sequencing 50% of time. Participated in w/c propulsion with max verbal cues to nsg station and back to room as PTA warmed up food. Pt continues to demonstrate poor awareness of R side as frequently bumping into wall and other objects with pt minimally following cues for safety. Pt agreeable to ear breakfast with PTA providing cues for small bites, and to use utensils vs bringing bowl of grits to mouth. Attempted to engage pt in meaningful conversation in short bouts as pt asking PTA question such as "are you from here". Pt able to follow for 2-3 questions before becoming increasingly distracted. Transported to hallway and attempted gait at wall rail. Pt performed sit to stand with minA and able to ambulate modA x 1515ft. PTA assisting with placement of RLE as noted heavy scissoring this session. Pt then becoming more agitated with PTA assistance and attempting to turn away from wall to sit. PTA and rehab tech guided pt to w/c. Pt performed w/c mobility approx 2875ft with max vc's for object avoidance with pt following commands this time. Once returned to room, per nsg ok to remain in w/c in room. Placed quick release belt and half lap tray with call bell within reach.       Therapy Documentation Precautions:  Precautions Precautions: Fall Restrictions Weight Bearing  Restrictions: No General:   Vital Signs:   Pain: Pain Assessment Faces Pain Scale: No hurt   See Function Navigator for Current Functional Status.   Therapy/Group: Individual Therapy  Harrel Ferrone  Jonah Nestle, PTA  09/22/2017, 12:05 PM

## 2017-09-22 NOTE — Plan of Care (Signed)
  Problem: RH BOWEL ELIMINATION Goal: RH STG MANAGE BOWEL WITH ASSISTANCE Description STG Manage Bowel with Assistance. Mod  Outcome: Progressing   Problem: RH BLADDER ELIMINATION Goal: RH STG MANAGE BLADDER WITH ASSISTANCE Description STG Manage Bladder With Assistance. Mod  Outcome: Progressing Goal: RH STG MANAGE BLADDER WITH EQUIPMENT WITH ASSISTANCE Description STG Manage Bladder With Equipment With Assistance. MOD  Outcome: Progressing   Problem: RH COGNITION-NURSING Goal: RH STG USES MEMORY AIDS/STRATEGIES W/ASSIST TO PROBLEM SOLVE Description STG Uses Memory Aids/Strategies With mod  Assistance to Problem Solve. Cues and supervision   Outcome: Progressing   Problem: Consults Goal: RH STROKE PATIENT EDUCATION Description See Patient Education module for education specifics  Outcome: Progressing   

## 2017-09-22 NOTE — Progress Notes (Signed)
Occupational Therapy Session Note  Patient Details  Name: Carla Bauer MRN: [id number] Date of Birth: 1955-06-25  Today's Date: 09/22/2017 OT Individual Time: 1000-1100 OT Individual Time Calculation (min): 60 min    Short Term Goals: Week 2:  OT Short Term Goal 1 (Week 2): Pt will sit EOB with no more than mod A while participating in BADL task OT Short Term Goal 1 - Progress (Week 2): Met OT Short Term Goal 2 (Week 2): Pt will locate 1/4 grooming items at the sink with mod instructional cues OT Short Term Goal 2 - Progress (Week 2): Progressing toward goal OT Short Term Goal 3 (Week 2): Pt will complete toilet transfer with max +2 assist OT Short Term Goal 3 - Progress (Week 2): Met Week 3:  OT Short Term Goal 1 (Week 3): STG=LTG 2/2 ELOS  Skilled Therapeutic Interventions/Progress Updates:    1;1. Pt reporting need to toilet upon arrival. Pt sit to stand in stedy from low w/c with MOD A to transfer to [facility] over toilet. Pt completes toileting with A for clothing manamgnet. Pt threads BLE into pant legs after OT places into seated figure 4 with cues for hemi technique. Pt able to don shirt with A to thread RUE into sleeve and step by step cues to follow hemi techniques. Pt sit to stand at [employer] with MOD A for lifting and min-MAX A for standing balance while sorting silverware into organizer R of midline. Pt requires manual facilitation of head turns to locate correct pockets to sort silverware. Pt participates in making coffee locating ingredients on R with VC for head turns and step by step cues. Exited session with pt seated in w.c call light in reach and QRB on.   Therapy Documentation Precautions:  Precautions Precautions: Fall Restrictions Weight Bearing Restrictions: No General:   Vital Signs: Therapy Vitals Pulse Rate: 96 BP: 95/67 Oxygen Therapy SpO2: 100 % Pain: Pain Assessment Pain Score: 0-No pain Faces Pain Scale: No hurt  See Function Navigator for  Current Functional Status.   Therapy/Group: Individual Therapy  Tonny Branch 09/22/2017, 10:57 AM

## 2017-09-23 ENCOUNTER — Inpatient Hospital Stay (HOSPITAL_COMMUNITY): Payer: Self-pay | Admitting: Occupational Therapy

## 2017-09-23 ENCOUNTER — Inpatient Hospital Stay (HOSPITAL_COMMUNITY): Payer: Self-pay

## 2017-09-23 DIAGNOSIS — Z931 Gastrostomy status: Secondary | ICD-10-CM

## 2017-09-23 DIAGNOSIS — I69391 Dysphagia following cerebral infarction: Secondary | ICD-10-CM

## 2017-09-23 DIAGNOSIS — R000 Tachycardia, unspecified: Secondary | ICD-10-CM

## 2017-09-23 DIAGNOSIS — D62 Acute posthemorrhagic anemia: Secondary | ICD-10-CM

## 2017-09-23 DIAGNOSIS — S06353S Traumatic hemorrhage of left cerebrum with loss of consciousness of 1 hours to 5 hours 59 minutes, sequela: Secondary | ICD-10-CM

## 2017-09-23 DIAGNOSIS — G8110 Spastic hemiplegia affecting unspecified side: Secondary | ICD-10-CM

## 2017-09-23 DIAGNOSIS — R7309 Other abnormal glucose: Secondary | ICD-10-CM

## 2017-09-23 LAB — GLUCOSE, CAPILLARY
GLUCOSE-CAPILLARY: 67 mg/dL (ref 65–99)
GLUCOSE-CAPILLARY: 110 mg/dL — AB (ref 65–99)
GLUCOSE-CAPILLARY: 117 mg/dL — AB (ref 65–99)
GLUCOSE-CAPILLARY: 106 mg/dL — AB (ref 65–99)
Glucose-Capillary: 95 mg/dL (ref 65–99)

## 2017-09-23 NOTE — Plan of Care (Signed)
Able to answer basic questions and follow one command at a time. Education about health and medications continues to be needed. Pt spoke to family on the phone to confirm d/c on Friday. Family confirmed to pt that information was true. Family only spoke to pt on the phone. 30 degrees HOB was enforced. Pt stated she sleeps better at 90 degrees. Pt was sleep by 23:00.

## 2017-09-23 NOTE — Progress Notes (Signed)
Orthopedic Tech Progress Note Patient Details:  Carla RightDorothy A Bauer 1955/09/02 [id number]  Patient ID: Carla Bauer, female   DOB: 1955/09/02, 62 y.o.   MRN: [id number]   Carla Bauer 09/23/2017, 12:56 PMCalled [facility] for right resting hand splint, cock up splint, Prafo boot.

## 2017-09-23 NOTE — Plan of Care (Signed)
  Problem: RH BOWEL ELIMINATION Goal: RH STG MANAGE BOWEL WITH ASSISTANCE Description STG Manage Bowel with Assistance. Mod  Outcome: Progressing   Problem: RH BLADDER ELIMINATION Goal: RH STG MANAGE BLADDER WITH ASSISTANCE Description STG Manage Bladder With Assistance. Mod  Outcome: Progressing Goal: RH STG MANAGE BLADDER WITH EQUIPMENT WITH ASSISTANCE Description STG Manage Bladder With Equipment With Assistance. MOD  Outcome: Progressing   Problem: RH COGNITION-NURSING Goal: RH STG USES MEMORY AIDS/STRATEGIES W/ASSIST TO PROBLEM SOLVE Description STG Uses Memory Aids/Strategies With mod  Assistance to Problem Solve. Cues and supervision   Outcome: Progressing   Problem: Consults Goal: RH STROKE PATIENT EDUCATION Description See Patient Education module for education specifics  Outcome: Progressing   

## 2017-09-23 NOTE — Progress Notes (Signed)
Ortho called for resting hand splint, cock up wrist, and PRAFO.  K Careli Luzader, LPN 

## 2017-09-23 NOTE — Progress Notes (Signed)
Speech Language Pathology Daily Session Note  Patient Details  Name: Carla Bauer MRN: [id number] Date of Birth: 03-May-1955  Today's Date: 09/23/2017 SLP Individual Time: 0800-0900 SLP Individual Time Calculation (min): 60 min  Short Term Goals: Week 3: SLP Short Term Goal 1 (Week 3): Patient will consume trials of thin liquids via tsp/cup with minimal overt s/s of aspiration with Mod A verbal cues over 2 sessions to assess readiness for repeat MBS.  SLP Short Term Goal 2 (Week 3): Patient will consume dys 1 textures and nectar thick liquids with Mod A multimodal cues for use of swallowing precautions and no overt s/s of aspiration.  SLP Short Term Goal 3 (Week 3): Patient will orient to place and situation with Max A multimodal cues.  SLP Short Term Goal 4 (Week 3): Pt will locate items at midline in 75% of opportunities during basic, structured tasks Mod A multimodal cues.  SLP Short Term Goal 5 (Week 3): Patient will demonstrate sustained attention to functional tasks for 5 minutes with Max A verbal cues for redirection.  SLP Short Term Goal 6 (Week 3): Patient will initiate functional tasks with Mod A verbal cues.   Skilled Therapeutic Interventions:Skilled ST services focused on swallow skills, cognitive skills and family education. Pt's daughter present for tretament session. SLP facilitated PO consumption of breakfast tray dys 1 and NTL, pt required mod A verbal cues to initial swallow/oral holding and to consume small bites with no overt s/s aspiration. Pt required max A verbal cues for orientation to place, situation and time. SLP facilitated sustained attention, task initiation and location of items at midline utilizing naming common items, pt required max A verbal cues for sustained attention in 5 minute intervals, mod A verbal cues to initiate requested task, mod A verbal cues to scan to midline and max A verbal/visual cues to scan right. Pt requested to use bathroom., SLP and NT  asssited pt to bathroom utilizing steady, pt became extremely combative when being transferred into bed and required max A verbal cues allow placement in bed due to impaired safety awareness requesting to stay on edge of bed. SLP provided education to daughter ability cues for swallow compensatory strategies and to ensure safety despite pt's understanding, daughter stated agreement, however continued education is needed. Pt was left in room with call bell within reach and bed alarm set. Recommend to continue skilled ST services.      Function:  Eating Eating   Modified Consistency Diet: Yes Eating Assist Level: Set up assist for;More than reasonable amount of time;Supervision or verbal cues   Eating Set Up Assist For: Parenteral or tube feed supplies Helper Scoops Food on Utensil: Every scoop Helper Brings Food to Mouth: Every scoop   Cognition Comprehension Comprehension assist level: Understands basic 50 - 74% of the time/ requires cueing 25 - 49% of the time  Expression   Expression assist level: Expresses basic 25 - 49% of the time/requires cueing 50 - 75% of the time. Uses single words/gestures.  Social Interaction Social Interaction assist level: Interacts appropriately 50 - 74% of the time - May be physically or verbally inappropriate.  Problem Solving Problem solving assist level: Solves basic 25 - 49% of the time - needs direction more than half the time to initiate, plan or complete simple activities  Memory Memory assist level: Recognizes or recalls 25 - 49% of the time/requires cueing 50 - 75% of the time    Pain Pain Assessment Pain Score: 0-No pain  Therapy/Group: Individual Therapy  Findley Vi  [facility] 09/23/2017, 2:37 PM

## 2017-09-23 NOTE — Progress Notes (Signed)
Subjective/Complaints: Patient seen lying in bed this morning, talking on the phone.  ROS: limited due to cognitive/behavioral   Objective: Vital Signs: Blood pressure 113/75, pulse (!) 112, temperature 99 F (37.2 C), temperature source Oral, resp. rate 18, height 5\' 1"  (1.549 m), weight 63 kg (138 lb 14.2 oz), SpO2 100 %. No results found. Results for orders placed or performed during the hospital encounter of 09/03/17 (from the past 72 hour(s))  Glucose, capillary     Status: Abnormal   Collection Time: 09/20/17 12:17 PM  Result Value Ref Range   Glucose-Capillary 210 (H) 65 - 99 mg/dL  Glucose, capillary     Status: None   Collection Time: 09/20/17  4:56 PM  Result Value Ref Range   Glucose-Capillary 83 65 - 99 mg/dL  Glucose, capillary     Status: Abnormal   Collection Time: 09/20/17  9:28 PM  Result Value Ref Range   Glucose-Capillary 141 (H) 65 - 99 mg/dL  Glucose, capillary     Status: None   Collection Time: 09/21/17  6:45 AM  Result Value Ref Range   Glucose-Capillary 88 65 - 99 mg/dL  Glucose, capillary     Status: Abnormal   Collection Time: 09/21/17 12:03 PM  Result Value Ref Range   Glucose-Capillary 102 (H) 65 - 99 mg/dL  Glucose, capillary     Status: Abnormal   Collection Time: 09/21/17  4:43 PM  Result Value Ref Range   Glucose-Capillary 112 (H) 65 - 99 mg/dL  Glucose, capillary     Status: Abnormal   Collection Time: 09/21/17  9:40 PM  Result Value Ref Range   Glucose-Capillary 219 (H) 65 - 99 mg/dL  Glucose, capillary     Status: None   Collection Time: 09/22/17  6:30 AM  Result Value Ref Range   Glucose-Capillary 84 65 - 99 mg/dL  Glucose, capillary     Status: Abnormal   Collection Time: 09/22/17 12:10 PM  Result Value Ref Range   Glucose-Capillary 100 (H) 65 - 99 mg/dL  Glucose, capillary     Status: Abnormal   Collection Time: 09/22/17  4:34 PM  Result Value Ref Range   Glucose-Capillary 149 (H) 65 - 99 mg/dL  Glucose, capillary     Status:  Abnormal   Collection Time: 09/22/17  9:26 PM  Result Value Ref Range   Glucose-Capillary 102 (H) 65 - 99 mg/dL   Comment 1 Notify RN   Glucose, capillary     Status: None   Collection Time: 09/23/17  6:56 AM  Result Value Ref Range   Glucose-Capillary 67 65 - 99 mg/dL  Glucose, capillary     Status: None   Collection Time: 09/23/17  7:37 AM  Result Value Ref Range   Glucose-Capillary 95 65 - 99 mg/dL     Constitutional: No distress . Vital signs reviewed. HENT: Normocephalic.  Atraumatic. Eyes: EOMI. No discharge. Cardiovascular: + tachycardic. Regular rhythm.. No JVD. Respiratory: CTA Bilaterally. Normal effort. GI: BS +. Non-distended. Musc: No edema or tenderness in extremities. Neuro: alert and oriented 1 Aphasia Motor: Right upper extremity: 0/5 with tone at wrist Right lower extremity: Hip flexion 2 -/5, distally 0/5 tone at ankle Skin:   Wound well healed incision Left crani   Assessment/Plan: 1. 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 Physiatrist is providing close team supervision and 24 hour management of active  medical problems listed below. Physiatrist and rehab team continue to assess barriers to discharge/monitor patient progress toward functional and medical goals. FIM: Function - Bathing Position: Wheelchair/chair at sink Body parts bathed by patient: Abdomen, Chest, Right arm, Front perineal area, Right upper leg, Left upper leg Body parts bathed by helper: Right lower leg, Left lower leg, Back, Left arm, Buttocks Assist Level: Touching or steadying assistance(Pt > 75%)  Function- Upper Body Dressing/Undressing What is the patient wearing?: Pull over shirt/dress Pull over shirt/dress - Perfomed by patient: Thread/unthread left sleeve, Put head through opening, Pull shirt over trunk Pull over shirt/dress -  Perfomed by helper: Thread/unthread right sleeve Assist Level: Touching or steadying assistance(Pt > 75%) Function - Lower Body Dressing/Undressing What is the patient wearing?: Pants Position: Wheelchair/chair at sink Pants- Performed by patient: Thread/unthread left pants leg Pants- Performed by helper: Thread/unthread right pants leg, Pull pants up/down Non-skid slipper socks- Performed by helper: Don/doff right sock, Don/doff left sock Assist for footwear: Partial/moderate assist Assist for lower body dressing: Touching or steadying assistance (Pt > 75%)  Function - Toileting Toileting activity did not occur: No continent bowel/bladder event Toileting steps completed by patient: Performs perineal hygiene(standing in stedy) Toileting steps completed by helper: Adjust clothing prior to toileting, Adjust clothing after toileting Assist level: Two helpers  Function - Archivist transfer activity did not occur: Safety/medical concerns Toilet transfer assistive device: Systems developer lift: Stedy Assist level to toilet: 2 helpers Assist level from toilet: 2 helpers  Function - Chair/bed transfer Chair/bed transfer method: Stand pivot Chair/bed transfer assist level: Moderate assist (Pt 50 - 74%/lift or lower) Chair/bed transfer assistive device: Armrests Mechanical lift: Stedy Chair/bed transfer details: Manual facilitation for weight shifting, Manual facilitation for placement, Verbal cues for technique, Verbal cues for precautions/safety  Function - Locomotion: Wheelchair Will patient use wheelchair at discharge?: Yes Type: Manual Max wheelchair distance: >150' Assist Level: Touching or steadying assistance (Pt > 75%) Assist Level: Touching or steadying assistance (Pt > 75%) Assist Level: Touching or steadying assistance (Pt > 75%) Turns around,maneuvers to table,bed, and toilet,negotiates 3% grade,maneuvers on rugs and over doorsills: No Function -  Locomotion: Ambulation Ambulation activity did not occur: Safety/medical concerns Assistive device: Rail in hallway Max distance: 30' Assist level: Moderate assist (Pt 50 - 74%) Assist level: Moderate assist (Pt 50 - 74%)  Function - Comprehension Comprehension: Auditory Comprehension assist level: Understands basic 50 - 74% of the time/ requires cueing 25 - 49% of the time  Function - Expression Expression: Verbal Expression assist level: Expresses basic 50 - 74% of the time/requires cueing 25 - 49% of the time. Needs to repeat parts of sentences.  Function - Social Interaction Social Interaction assist level: Interacts appropriately 75 - 89% of the time - Needs redirection for appropriate language or to initiate interaction.  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 DVT Prophylaxis/Anticoagulation: Subcutaneous heparin. LE vascular study negative 3. Pain Management:Tylenol as needed 4. Mood:Provide emotional support  Added scheduled Seroquel at night-reduced dose secondary to sedation 12.5  Added as needed Seroquel for agitation.  Want to avoid oversedation 5. Neuropsych: This patientis notcapable of making decisions on herown behalf. 6. Skin/Wound Care:Routine skin checks 7.  Fluids/Electrolytes/Nutrition:Routine in and outs  NGT feedings, adjust rate/H20 as needed- dietary consult PEG tube placed, tolerating bolus feeds 8.Dysphagia. Dysphagia #1 nectar liquids s/p PEG 9.Hyperglycemia induced from tube feeds. Continue Lantus insulin for coverage. CBG (last 3)  Recent Labs    09/22/17 2126 09/23/17 0656 09/23/17 0737  GLUCAP 102* 67 95   Labile on 6/24 with hypoglycemia 10.Hypertension. Lopressor 50 mg twice daily Vitals:   09/23/17 0509 09/23/17 0800  BP: 92/62 113/75  Pulse: (!) 104 (!) 112  Resp: 18   Temp: 99 F (37.2 C)   SpO2: 100% 100%   BP Labile on 6/24 with sinus tachycardia-lopressor 100mg  BID 11.  Anemia: Baseline Hgb ?11.  Hemoglobin 9.2 on 6/5  Labs ordered for tomorrow 12.  Agitation due to confusion, balancing control of agitation with sedation  13. Sinus tachy- likely multifactorial anemia, not hypoxic ,6/12 normal BMET Lopressor was increased to 100 mg twice a day on 09/15/2017   LOS (Days) 20 A FACE TO FACE EVALUATION WAS PERFORMED  Oluwadamilola Deliz Karis JubaAnil Kaisha Wachob 09/23/2017, 11:30 AM

## 2017-09-23 NOTE — Progress Notes (Signed)
Physical Therapy Session Note  Patient Details  Name: Carla Bauer MRN: [id number] Date of Birth: 14-Apr-1955  Today's Date: 09/23/2017 PT Individual Time: 1100-1155 PT Individual Time Calculation (min): 55 min   Short Term Goals: Week 3:  PT Short Term Goal 1 (Week 3): STG=LTG   Skilled Therapeutic Interventions/Progress Updates:    Pt received from OT in room, seated in w/c and pts daughter present for training. Pt transported to rehab apartment. Therapist educated pt's daughter Carla Kerbs(Penny) on transfer techniques, pt performed stand pivot transfer with mod assist x 1 with assist from therapist and x 1 with assist from daughter. Pt also performed bed mobility with min assist sit>supine and mod assist supine>sitting. Pt continues to be resistant to participation with therapist and at times demonstrates increased agitation, therapist directing care through rehab tech as pt does not following commands directly from therapist. Pt transported to ortho gym, performed car transfer with max assist in each direction, therapist educating pt's daughter on techniques, will let daughter practice at a later session. Pt ambulated x 20 ft with RW and R hand orthosis, max assist with +2 for w/c follow for safety, pt able to advance R LE, demonstrates R knee hyperextension during stance. Pt performed stand pivot from w/c<>mat with assist from daughter in each direction. Pt transported back to room, therapist educated daughter on d/c planning and equipment recommendations.   Therapy Documentation Precautions:  Precautions Precautions: Fall Restrictions Weight Bearing Restrictions: No   See Function Navigator for Current Functional Status.   Therapy/Group: Individual Therapy  Cresenciano GenreEmily van Schagen, PT, DPT 09/23/2017, 8:02 AM

## 2017-09-23 NOTE — Progress Notes (Signed)
Occupational Therapy Session Note  Patient Details  Name: Carla Bauer MRN: [id number] Date of Birth: 1955/06/12  Today's Date: 09/23/2017 OT Individual Time: 1005-1100 OT Individual Time Calculation (min): 55 min   Skilled Therapeutic Interventions/Progress Updates:    Pt greeted supine in bed. Requesting to use restroom and motivated to get washed/dressed. Tx focus on balance, Rt NMR, sit<stands, and functional transfers. She completed toilet transfer with use of Stedy and 2 helpers due to Rt lean. Pt completed perihygiene with balance assist while standing in device. Afterwards proceeded with bathing/dressing w/c level sit<stand with Stedy (Mod A) or 2 helpers with Rt side supported. Pt able to wash Rt foot and don gripper sock when therapist placed limb in figure 4. Able to lift R LE without UE assist when donning pants. Mod A for overhead shirt with instruction on hemi technique. Dtr present throughout session to observe methods for having pt complete self care tasks at max level of independence. At end of session pt was left via PT handoff.   Therapy Documentation Precautions:  Precautions Precautions: Fall Restrictions Weight Bearing Restrictions: No Pain: No c/o pain during session    ADL: ADL ADL Comments: Please see functional navigator     See Function Navigator for Current Functional Status.   Therapy/Group: Individual Therapy  Ziad Maye A Ancil Dewan 09/23/2017, 12:20 PM

## 2017-09-23 NOTE — Progress Notes (Signed)
Social Work Patient ID: Carla Bauer, female   DOB: 08-09-1955, 62 y.o.   MRN: [id number]  Met with pt and daughter-[name] who was here to go through therapies with pt and begin to learn her care. She reports she is staying with sister-[name] and all will be living together at discharge. Discussed equipment and follow up, daughter will pan to come back and continue the education. Aware preparing for discharge on Friday 6/28. Will await [name] coming in she is a CNA according to [facility] and knows what to do to assist pt. Work toward discharge Friday.

## 2017-09-24 ENCOUNTER — Inpatient Hospital Stay (HOSPITAL_COMMUNITY): Payer: Self-pay | Admitting: Occupational Therapy

## 2017-09-24 ENCOUNTER — Inpatient Hospital Stay (HOSPITAL_COMMUNITY): Payer: Self-pay | Admitting: Speech Pathology

## 2017-09-24 ENCOUNTER — Inpatient Hospital Stay (HOSPITAL_COMMUNITY): Payer: Self-pay | Admitting: Physical Therapy

## 2017-09-24 DIAGNOSIS — E870 Hyperosmolality and hypernatremia: Secondary | ICD-10-CM

## 2017-09-24 LAB — CBC WITH DIFFERENTIAL/PLATELET
ABS IMMATURE GRANULOCYTES: 0.1 10*3/uL (ref 0.0–0.1)
Basophils Absolute: 0 10*3/uL (ref 0.0–0.1)
Basophils Relative: 1 %
EOS PCT: 3 %
Eosinophils Absolute: 0.2 10*3/uL (ref 0.0–0.7)
HEMATOCRIT: 29 % — AB (ref 36.0–46.0)
HEMOGLOBIN: 8.8 g/dL — AB (ref 12.0–15.0)
Immature Granulocytes: 1 %
LYMPHS ABS: 2.7 10*3/uL (ref 0.7–4.0)
LYMPHS PCT: 31 %
MCH: 29.6 pg (ref 26.0–34.0)
MCHC: 30.3 g/dL (ref 30.0–36.0)
MCV: 97.6 fL (ref 78.0–100.0)
Monocytes Absolute: 0.7 10*3/uL (ref 0.1–1.0)
Monocytes Relative: 8 %
NEUTROS ABS: 5.1 10*3/uL (ref 1.7–7.7)
Neutrophils Relative %: 58 %
Platelets: 363 10*3/uL (ref 150–400)
RBC: 2.97 MIL/uL — AB (ref 3.87–5.11)
RDW: 15.2 % (ref 11.5–15.5)
WBC: 8.8 10*3/uL (ref 4.0–10.5)

## 2017-09-24 LAB — BASIC METABOLIC PANEL
ANION GAP: 6 (ref 5–15)
BUN: 12 mg/dL (ref 8–23)
CHLORIDE: 110 mmol/L (ref 98–111)
CO2: 31 mmol/L (ref 22–32)
Calcium: 9.6 mg/dL (ref 8.9–10.3)
Creatinine, Ser: 0.6 mg/dL (ref 0.44–1.00)
GFR calc Af Amer: >60 mL/min (ref >60)
GFR calc non Af Amer: >60 mL/min (ref >60)
GLUCOSE: 87 mg/dL (ref 70–99)
POTASSIUM: 4.1 mmol/L (ref 3.5–5.1)
Sodium: 147 mmol/L — ABNORMAL HIGH (ref 135–145)

## 2017-09-24 LAB — GLUCOSE, CAPILLARY
GLUCOSE-CAPILLARY: 83 mg/dL (ref 70–99)
GLUCOSE-CAPILLARY: 141 mg/dL — AB (ref 70–99)
GLUCOSE-CAPILLARY: 91 mg/dL (ref 70–99)
Glucose-Capillary: 150 mg/dL — ABNORMAL HIGH (ref 70–99)

## 2017-09-24 MED ORDER — FREE WATER
200.0000 mL | Freq: Every day | Status: DC
  Administered 2017-09-24 – 2017-09-27 (×13): 200 mL

## 2017-09-24 NOTE — Progress Notes (Signed)
Occupational Therapy Discharge Summary  Patient Details  Name: Carla Bauer MRN: [id number] Date of Birth: [birth year]     Patient has met 6 of 7 long term goals due to improved activity tolerance, improved balance, postural control, improved awareness and improved coordination.  Patient to discharge at overall Mod Assist level with mod to max multimodal cues for sequencing, functional problem solving, organization and visual and body awareness to the right .  [name] care partner is independent to provide the necessary physical and cognitive assistance at discharge ; pt's caregiver; daughter, has been educated and serve as primary caregiver.    Reasons goals not met: Pt still required mod A- max A for LB dressing due to decr attention to the right side and can be dependent on type of clothing. Pt unable to don AFO when needed/ applied.  Recommendation:  Patient will benefit from ongoing skilled OT services in home health setting to continue to advance functional skills in the area of BADL and Reduce care partner burden.  Equipment: tub transfer bench, 3-in-1 BSC, wc, 1/2 lap tray, RW  Reasons for discharge: lack of progress toward goals and treatment goals met  Patient/family agrees with progress made and goals achieved: Yes  OT Discharge Precautions/Restrictions  Precautions Precautions: Fall Restrictions Weight Bearing Restrictions: No  Vital Signs Therapy Vitals Temp: 98 F (36.7 C) Temp Source: Oral Pulse Rate: (!) 102 Resp: 18 BP: 117/87 Patient Position (if appropriate): Sitting Oxygen Therapy SpO2: 100 % O2 Device: Room Air Pain  no noted pain  ADL ADL ADL Comments: see functional navigator Vision Baseline Vision/History: No visual deficits Patient Visual Report: No change from baseline Vision Assessment?: Yes Eye Alignment: Impaired (comment)(L gaze preference) Ocular Range of Motion: Within Functional Limits Alignment/Gaze Preference: Gaze  left Tracking/Visual Pursuits: Requires cues, head turns, or add eye shifts to track Saccades: Additional head turns occurred during testing;Additional eye shifts occurred during testing;Undershoots Convergence: Impaired - to be further tested in functional context Perception  Perception: Impaired Inattention/Neglect: Does not attend to right visual field;Does not attend to right side of body Praxis Praxis: Impaired Praxis Impairment Details: Motor planning;Ideomotor Cognition Overall Cognitive Status: Impaired/Different from baseline Arousal/Alertness: Awake/alert Orientation Level: Disoriented to time;Disoriented to situation;Oriented to person;Oriented to place Attention: Sustained Focused Attention: Appears intact Sustained Attention: Impaired Sustained Attention Impairment: Functional complex Memory: Impaired Memory Impairment: Decreased recall of new information;Decreased short term memory;Storage deficit Awareness: Impaired Awareness Impairment: Intellectual impairment Problem Solving: Impaired Executive Function: (ALL Impaired due to lower level of cognition impairment) Behaviors: Restless;Impulsive;Verbal agitation Safety/Judgment: Impaired Comments: right neglect Sensation Sensation Light Touch: Impaired Detail Light Touch Impaired Details: Impaired RUE Hot/Cold: Impaired Detail Hot/Cold Impaired Details: Impaired RUE Proprioception: Impaired Detail Proprioception Impaired Details: Impaired RUE Stereognosis: Not tested Coordination Gross Motor Movements are Fluid and Coordinated: No Fine Motor Movements are Fluid and Coordinated: No Coordination and Movement Description: increased tone in right  UE  Finger Nose Finger Test: unable against gravity Motor  Motor Motor: Hemiplegia;Abnormal tone;Abnormal postural alignment and control;Motor apraxia Motor - Discharge Observations: R hemi, mild increase in R hamstring and adductor tone, decreased midline  orientation Mobility  Bed Mobility Bed Mobility: Rolling Right;Rolling Left;Supine to Sit;Sit to Supine Rolling Right: Minimal Assistance - Patient > 75% Rolling Left: Minimal Assistance - Patient > 75% Supine to Sit: Minimal Assistance - Patient > 75% Sit to Supine: Minimal Assistance - Patient > 75% Transfers Sit to Stand: Minimal Assistance - Patient > 75% Stand to Sit: Minimal Assistance - Patient >  75%  Trunk/Postural Assessment  Cervical Assessment Cervical Assessment: (forward head and rounded shoulders) Thoracic Assessment Thoracic Assessment: (kyphotic) Lumbar Assessment Lumbar Assessment: (posterior pelvic tilt) Postural Control Postural Control: Deficits on evaluation Trunk Control: requires UE assist to maintain balance w/ supervision Protective Responses: delayed to the right   Balance Static Sitting Balance Static Sitting - Level of Assistance: 5: Stand by assistance Dynamic Sitting Balance Dynamic Sitting - Balance Support: Feet supported;No upper extremity supported;During functional activity Dynamic Sitting - Level of Assistance: 3: Mod assist;4: Min assist Static Standing Balance Static Standing - Level of Assistance: 3: Mod assist Dynamic Standing Balance Dynamic Standing - Level of Assistance: 2: Max assist;1: +1 Total assist Extremity/Trunk Assessment RUE Assessment Passive Range of Motion (PROM) Comments: 0-90 at the shoulder with tone present  General Strength Comments: Pt with active assist with shoulder adduction, flexion and bicep flexion  RUE Body System: Neuro Brunstrum levels for arm and hand: Arm;Hand Brunstrum level for arm: Stage II Synergy is developing Brunstrum level for hand: Stage II Synergy is developing RUE Tone RUE Tone: Modified Ashworth Body Part - Modified Ashworth Scale: Elbow;Wrist;Fingers;Thumb Elbow - Modified Ashworth Scale for Grading Hypertonia RUE: More marked increase in muscle tone through most of the ROM, but affected  part(s) easily moved Wrist - Modified Ashworth Scale for Grading Hypertonia RUE: More marked increase in muscle tone through most of the ROM, but affected part(s) easily moved Fingers - Modified Ashworth Scale for Grading Hypertonia RUE: More marked increase in muscle tone through most of the ROM, but affected part(s) easily moved Thumb - Modified Ashworth Scale for Grading Hypertonia RUE: Slight increase in muscle tone, manifested by a catch, followed by minimal resistance throughout the remainder (less than half) of the ROM     See Function Navigator for Current Functional Status.  Willeen Cass [facility] 09/26/2017, 3:18 PM

## 2017-09-24 NOTE — Progress Notes (Signed)
Subjective/Complaints: Patient seen lying in bed this morning. No reported issues overnight.  ROS: limited due to cognitive/behavioral, but appears to deny CP, SOB, nausea, vomiting, diarrhea.  Objective: Vital Signs: Blood pressure 111/63, pulse (!) 103, temperature 97.6 F (36.4 C), temperature source Oral, resp. rate 17, height 5' 1"  (1.549 m), weight 68.1 kg (150 lb 2.1 oz), SpO2 100 %. No results found. Results for orders placed or performed during the hospital encounter of 09/03/17 (from the past 72 hour(s))  Glucose, capillary     Status: Abnormal   Collection Time: 09/21/17 12:03 PM  Result Value Ref Range   Glucose-Capillary 102 (H) 65 - 99 mg/dL  Glucose, capillary     Status: Abnormal   Collection Time: 09/21/17  4:43 PM  Result Value Ref Range   Glucose-Capillary 112 (H) 65 - 99 mg/dL  Glucose, capillary     Status: Abnormal   Collection Time: 09/21/17  9:40 PM  Result Value Ref Range   Glucose-Capillary 219 (H) 65 - 99 mg/dL  Glucose, capillary     Status: None   Collection Time: 09/22/17  6:30 AM  Result Value Ref Range   Glucose-Capillary 84 65 - 99 mg/dL  Glucose, capillary     Status: Abnormal   Collection Time: 09/22/17 12:10 PM  Result Value Ref Range   Glucose-Capillary 100 (H) 65 - 99 mg/dL  Glucose, capillary     Status: Abnormal   Collection Time: 09/22/17  4:34 PM  Result Value Ref Range   Glucose-Capillary 149 (H) 65 - 99 mg/dL  Glucose, capillary     Status: Abnormal   Collection Time: 09/22/17  9:26 PM  Result Value Ref Range   Glucose-Capillary 102 (H) 65 - 99 mg/dL   Comment 1 Notify RN   Glucose, capillary     Status: None   Collection Time: 09/23/17  6:56 AM  Result Value Ref Range   Glucose-Capillary 67 65 - 99 mg/dL  Glucose, capillary     Status: None   Collection Time: 09/23/17  7:37 AM  Result Value Ref Range   Glucose-Capillary 95 65 - 99 mg/dL  Glucose, capillary     Status: Abnormal   Collection Time: 09/23/17 11:48 AM  Result  Value Ref Range   Glucose-Capillary 110 (H) 65 - 99 mg/dL  Glucose, capillary     Status: Abnormal   Collection Time: 09/23/17  4:59 PM  Result Value Ref Range   Glucose-Capillary 117 (H) 65 - 99 mg/dL  Glucose, capillary     Status: Abnormal   Collection Time: 09/23/17  9:26 PM  Result Value Ref Range   Glucose-Capillary 106 (H) 65 - 99 mg/dL  Glucose, capillary     Status: None   Collection Time: 09/24/17  6:12 AM  Result Value Ref Range   Glucose-Capillary 83 70 - 99 mg/dL  Basic metabolic panel     Status: Abnormal   Collection Time: 09/24/17  7:17 AM  Result Value Ref Range   Sodium 147 (H) 135 - 145 mmol/L   Potassium 4.1 3.5 - 5.1 mmol/L   Chloride 110 98 - 111 mmol/L   CO2 31 22 - 32 mmol/L   Glucose, Bld 87 70 - 99 mg/dL   BUN 12 8 - 23 mg/dL   Creatinine, Ser 0.60 0.44 - 1.00 mg/dL   Calcium 9.6 8.9 - 10.3 mg/dL   GFR calc non Af Amer >60 >60 mL/min   GFR calc Af Amer >60 >60 mL/min    Comment: (  NOTE) The eGFR has been calculated using the CKD EPI equation. This calculation has not been validated in all clinical situations. eGFR's persistently <60 mL/min signify possible Chronic Kidney Disease.    Anion gap 6 5 - 15    Comment: Performed at [facility] [address] [place], [place] [address]  CBC with Differential/Platelet     Status: Abnormal   Collection Time: 09/24/17  7:17 AM  Result Value Ref Range   WBC 8.8 4.0 - 10.5 K/uL   RBC 2.97 (L) 3.87 - 5.11 MIL/uL   Hemoglobin 8.8 (L) 12.0 - 15.0 g/dL   HCT 29.0 (L) 36.0 - 46.0 %   MCV 97.6 78.0 - 100.0 fL   MCH 29.6 26.0 - 34.0 pg   MCHC 30.3 30.0 - 36.0 g/dL   RDW 15.2 11.5 - 15.5 %   Platelets 363 150 - 400 K/uL   Neutrophils Relative % 58 %   Neutro Abs 5.1 1.7 - 7.7 K/uL   Lymphocytes Relative 31 %   Lymphs Abs 2.7 0.7 - 4.0 K/uL   Monocytes Relative 8 %   Monocytes Absolute 0.7 0.1 - 1.0 K/uL   Eosinophils Relative 3 %   Eosinophils Absolute 0.2 0.0 - 0.7 K/uL   Basophils Relative 1 %    Basophils Absolute 0.0 0.0 - 0.1 K/uL   Immature Granulocytes 1 %   Abs Immature Granulocytes 0.1 0.0 - 0.1 K/uL    Comment: Performed at [facility] [address], [place], [place] [address]     Constitutional: No distress . Vital signs reviewed. HENT: Normocephalic.  Atraumatic. Eyes: EOMI. No discharge. Cardiovascular: +tachycardic. Regular rhythm.. No JVD. Respiratory: CTA Bilaterally. Normal effort. GI: BS +. Non-distended. Musc: No edema or tenderness in extremities. Neuro: Alert Aphasia, ? Improving Motor: Right upper extremity: 1/5 shoulder abduction, distally 0/5 with tone at wrist Right lower extremity: Hip flexion 2+/5, ?distally 0/5 tone at ankle Skin:   Wound well healed incision Left crani   Assessment/Plan: 1. 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 Physiatrist is providing close team supervision and 24 hour management of active medical problems listed below. Physiatrist and rehab team continue to assess barriers to discharge/monitor patient progress toward functional and medical goals. FIM: Function - Bathing Position: Wheelchair/chair at sink Body parts bathed by patient: Abdomen, Chest, Front perineal area, Right upper leg, Left upper leg, Left lower leg, Buttocks Body parts bathed by helper: Left arm, Right arm, Right lower leg, Back Assist Level: (sit<stand in [place])  Function- Upper Body Dressing/Undressing What is the patient wearing?: Pull over shirt/dress Pull over shirt/dress - Perfomed by patient: Thread/unthread left sleeve, Put head through opening Pull over shirt/dress - Perfomed by helper: Pull shirt over trunk, Thread/unthread right sleeve Assist Level: (Mod A) Function - Lower Body Dressing/Undressing What is the patient wearing?: Pants Position: Wheelchair/chair at sink Pants- Performed  by patient: Thread/unthread left pants leg Pants- Performed by helper: Thread/unthread right pants leg, Pull pants up/down Non-skid slipper socks- Performed by helper: Don/doff right sock, Don/doff left sock Assist for footwear: Partial/moderate assist Assist for lower body dressing: (2 helpers sit<stand without DME)  Function - Toileting Toileting activity did not occur: No continent bowel/bladder event Toileting steps completed by patient: Performs perineal hygiene(standing in [place]) Toileting steps completed by helper: Adjust clothing prior to toileting, Performs perineal hygiene, Adjust clothing after  toileting Assist level: Touching or steadying assistance (Pt.75%)  Function - Toilet Transfers Toilet transfer activity did not occur: Safety/medical concerns Toilet transfer assistive device: Mechanical lift Mechanical lift: Stedy Assist level to toilet: 2 helpers Assist level from toilet: 2 helpers  Function - Chair/bed transfer Chair/bed transfer method: Stand pivot Chair/bed transfer assist level: Moderate assist (Pt 50 - 74%/lift or lower) Chair/bed transfer assistive device: Armrests Mechanical lift: Stedy Chair/bed transfer details: Manual facilitation for weight shifting, Manual facilitation for placement, Verbal cues for technique, Verbal cues for precautions/safety  Function - Locomotion: Wheelchair Will patient use wheelchair at discharge?: Yes Type: Manual Max wheelchair distance: >150' Assist Level: Touching or steadying assistance (Pt > 75%) Assist Level: Touching or steadying assistance (Pt > 75%) Assist Level: Touching or steadying assistance (Pt > 75%) Turns around,maneuvers to table,bed, and toilet,negotiates 3% grade,maneuvers on rugs and over doorsills: No Function - Locomotion: Ambulation Ambulation activity did not occur: Safety/medical concerns Assistive device: Walker-rolling, Orthosis Max distance: 20 ft Assist level: Maximal assist (Pt 25 -  49%) Assist level: Maximal assist (Pt 25 - 49%)  Function - Comprehension Comprehension: Auditory Comprehension assist level: Understands basic 50 - 74% of the time/ requires cueing 25 - 49% of the time  Function - Expression Expression: Verbal Expression assist level: Expresses basic 25 - 49% of the time/requires cueing 50 - 75% of the time. Uses single words/gestures.  Function - Social Interaction Social Interaction assist level: Interacts appropriately 50 - 74% of the time - May be physically or verbally inappropriate.  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 DVT Prophylaxis/Anticoagulation: Subcutaneous heparin. LE vascular study negative 3. Pain Management:Tylenol as needed 4. Mood:Provide emotional support  Added scheduled Seroquel at night-reduced dose secondary to sedation   Added as needed Seroquel for agitation.  Want to avoid oversedation 5. Neuropsych: This patientis notcapable of making decisions on herown behalf. 6. Skin/Wound Care:Routine skin checks 7. Fluids/Electrolytes/Nutrition:Routine in and outs  PEG tube placed, tolerating bolus feeds 8.Dysphagia. Dysphagia #1 nectar liquids s/p PEG 9.Hyperglycemia induced from tube feeds. Continue Lantus insulin for coverage. CBG (last 3)  Recent Labs     09/23/17 1659 09/23/17 2126 09/24/17 0612  GLUCAP 117* 106* 83   Relatively controlled on 6/25 10.Hypertension. Lopressor  Vitals:   09/24/17 0526 09/24/17 0827  BP: (!) 116/96 111/63  Pulse: (!) 104 (!) 103  Resp: 17   Temp: 97.6 F (36.4 C)   SpO2: 100%    BP Labile on 6/25 11.  Anemia: Baseline Hgb ?11.  Hemoglobin 8.8 on 6/25  Continue to monitor 12.  Agitation due to confusion, balancing control of agitation with sedation  13. Sinus tachy- likely multifactorial anemia, not hypoxic ,6/12 normal BMET Lopressor was increased to 100 mg twice a day on 09/15/2017 14. Hypernatremia  Sodium 147 on 6/25  Continue to monitor, will adjust free water flushes if necessary  LOS (Days) 21 A FACE TO FACE EVALUATION WAS PERFORMED  Ankit Lorie Phenix 09/24/2017, 9:11 AM

## 2017-09-24 NOTE — Progress Notes (Signed)
Speech Language Pathology Daily Session Note  Patient Details  Name: Carla Bauer MRN: [id number] Date of Birth: 1956-02-22  Today's Date: 09/24/2017 SLP Individual Time: 1300-1345 SLP Individual Time Calculation (min): 45 min  Short Term Goals: Week 3: SLP Short Term Goal 1 (Week 3): Patient will consume trials of thin liquids via tsp/cup with minimal overt s/s of aspiration with Mod A verbal cues over 2 sessions to assess readiness for repeat MBS.  SLP Short Term Goal 2 (Week 3): Patient will consume dys 1 textures and nectar thick liquids with Mod A multimodal cues for use of swallowing precautions and no overt s/s of aspiration.  SLP Short Term Goal 3 (Week 3): Patient will orient to place and situation with Max A multimodal cues.  SLP Short Term Goal 4 (Week 3): Pt will locate items at midline in 75% of opportunities during basic, structured tasks Mod A multimodal cues.  SLP Short Term Goal 5 (Week 3): Patient will demonstrate sustained attention to functional tasks for 5 minutes with Max A verbal cues for redirection.  SLP Short Term Goal 6 (Week 3): Patient will initiate functional tasks with Mod A verbal cues.   Skilled Therapeutic Interventions:  Pt was seen for skilled ST targeting goals for family education, dysphagia, and cognition.  Pt's daughter was present upon therapist's arrival and was provided with skilled education regarding how to thicken liquids.  SLP demonstrated how to assess whether liquids were thickened to the appropriate viscosity and where to purchase thickener.  All questions were answered to pt's daughter's satisfaction although pt's daughter was not present for very much of today's session and quickly left stating that she needed to go get lunch.  Pt consumed nectar thick liquids with mod cues to monitor and correct anterior labial loss of liquids.  No overt s/s of aspiration were evident with thickened liquids.  Pt was pleasantly confused today and needed max  cues to reorient to upcoming discharge date.  Pt was perseverative on wanting things to be "clean and dry" for when she went home and was not able to be redirected despite max cues.  Pt was left in wheelchair with call bell within reach and chair alarm set, quick release belt donned.  Continue per current plan of care.    Function:  Eating Eating   Modified Consistency Diet: Yes Eating Assist Level: Supervision or verbal cues;Set up assist for           Cognition Comprehension Comprehension assist level: Understands basic 50 - 74% of the time/ requires cueing 25 - 49% of the time  Expression   Expression assist level: Expresses basic 50 - 74% of the time/requires cueing 25 - 49% of the time. Needs to repeat parts of sentences.  Social Interaction Social Interaction assist level: Interacts appropriately 50 - 74% of the time - May be physically or verbally inappropriate.  Problem Solving Problem solving assist level: Solves basic 25 - 49% of the time - needs direction more than half the time to initiate, plan or complete simple activities  Memory Memory assist level: Recognizes or recalls 25 - 49% of the time/requires cueing 50 - 75% of the time    Pain Pain Assessment Pain Scale: 0-10 Pain Score: 0-No pain  Therapy/Group: Individual Therapy  Tessla Spurling, Melanee SpryNicole L 09/24/2017, 3:57 PM

## 2017-09-24 NOTE — Progress Notes (Signed)
Social Work Patient ID: Carla Bauer, female   DOB: 01-30-1956, 62 y.o.   MRN: [id number]  Met with pt and daughter-[name] who is here to do hands on care and training. She reports it is going well and will be ready to go home on Friday. Have made referral for home health follow up and equipment to be delivered Thursday, to contact daughter to set up delivery of equipment. Will try to get an appointment with [facility] for PCP follow up prior to discharge. Work toward discharge on Friday.

## 2017-09-24 NOTE — Progress Notes (Signed)
Nutrition Follow-up  DOCUMENTATION CODES:   Not applicable  INTERVENTION:  Let pt attempt to eat at meals, if po intake is <50%: Provide a bolus feed via PEG using Osmolite 1.5 formula at volume of 240 ml up to TID. Additionally provide HS bolus daily.   Continue 30 ml Prostat po TID, each supplement provides 100 kcal and 15 grams of protein.   Provide 200 ml free water flushes 5 times daily between meals/bolus feedings.   If all four boluses are given, tube feeding regimen to provide 1740 kcal (100% of needs), 105 grams of protein, 1730 ml water.   Continue Magic cup TID with meals, each supplement provides 290 kcal and 9 grams of protein   NUTRITION DIAGNOSIS:   Inadequate oral intake related to dysphagia as evidenced by meal completion < 25%; ongoing  GOAL:   Patient will meet greater than or equal to 90% of their needs; met via TF  MONITOR:   PO intake, Supplement acceptance, Diet advancement, Weight trends, Labs, TF tolerance, Skin, I & O's  REASON FOR ASSESSMENT:   Consult Enteral/tube feeding initiation and management  ASSESSMENT:   62 year old female with history of headaches. Presented 08/14/2017 after being found down by her family with right sided weakness. Cranial CT scan showed left temporal hemorrhage. Underwent left craniotomy for clipping of aneurysm, evacuation of left temporal hematoma placement of right frontal external ventricular drain. Diet has been advanced to a dysphagia #1 nectar thick liquid as well as the nasogastric tube remains in place for nutritional support.   Pt continues on a dysphagia 1 diet with nectar thick liquids. Meal completion has been varied from 30-80%. Intake has slowly been improving. RD to modify tube feeding orders to provide a bolus feed if meal completion is <50%. Noted sodium elevated at 147. RD to increase free water with an additional 200 ml. RD to continue to monitor. Pt encouraged to eat her food at meals.   Diet Order:    Diet Order           DIET - DYS 1 Room service appropriate? Yes; Fluid consistency: Nectar Thick  Diet effective now          EDUCATION NEEDS:   Not appropriate for education at this time  Skin:  Skin Assessment: Skin Integrity Issues: Skin Integrity Issues:: Incisions Incisions: head  Last BM:  6/24  Height:   Ht Readings from Last 1 Encounters:  09/05/17 5' 1"  (1.549 m)    Weight:   Wt Readings from Last 1 Encounters:  09/24/17 150 lb 2.1 oz (68.1 kg)    Ideal Body Weight:  47.7 kg  BMI:  Body mass index is 28.37 kg/m.  Estimated Nutritional Needs:   Kcal:  1650-1850  Protein:  80-100 grams  Fluid:  Per MD    Corrin Parker, MS, RD, LDN Pager # [phone] After hours/ weekend pager # [phone]

## 2017-09-24 NOTE — Progress Notes (Signed)
Physical Therapy Session Note  Patient Details  Name: Carla Bauer MRN: [id number] Date of Birth: 1956/03/02  Today's Date: 09/24/2017 PT Individual Time: 1100-1150 PT Individual Time Calculation (min): 50 min   Short Term Goals: Week 3:  PT Short Term Goal 1 (Week 3): STG=LTG   Skilled Therapeutic Interventions/Progress Updates:   Pt in w/c and agreeable to therapy, daughter present for continued family education. Pt requesting coffee before we begin session. Provided w/ thickened coffee during seated rest breaks w/ max cues to prevent spillage to R side. Session focused on gait training w/ RW and family education. Ambulated 15' and 30' w/ RW and mod assist overall, RUE orthosis, and RAFO. Pt able to clear R toe much better and w/ improved R hip flexion and step length. She primarily only needed assist to prevent scissoring. Additionally decreased RLE hyperextension in single leg stance. Practiced performing transfers w/ RAFO on and daughter assisting, mod assist overall and daughter able to manage RLE better during transfer. Returned to room and ended session in w/c and in care of daughter, all needs met.   Therapy Documentation Precautions:  Precautions Precautions: Fall Restrictions Weight Bearing Restrictions: No Vital Signs: Therapy Vitals Pulse Rate: (!) 103 BP: 111/63 Patient Position (if appropriate): Lying Pain: Pain Assessment Pain Scale: 0-10 Pain Score: 0-No pain  See Function Navigator for Current Functional Status.   Therapy/Group: Individual Therapy  Danyela Posas K Arnette 09/24/2017, 12:00 PM

## 2017-09-24 NOTE — Progress Notes (Signed)
Occupational Therapy Session Note  Patient Details  Name: Arvid RightDorothy A Howes MRN: [id number] Date of Birth: 1955-07-02  Today's Date: 09/24/2017 OT Individual Time: 0905-1000 OT Individual Time Calculation (min): 55 min   Short Term Goals: Week 3:  OT Short Term Goal 1 (Week 3): STG=LTG 2/2 ELOS  Skilled Therapeutic Interventions/Progress Updates:    Pt greeted sitting in wc with daughter present and agreeable to OT treatment session focused on pt/family education. Educated pt's daughter on dressing techniques and ways to set-up R side for pt to dress. Daughter then assisted with sit<>stand at the sink to help manage clothing in standing. Had daughter work with wc functions including leg rests and arm rests. Discussed home bathroom set-up and completed tub shower transfer and toilet transfer with daughter assisting pt. Educated on importance of use of gait belt for all transfers. Pts daughter demonstrated understanding. Educated daughter on R UE massage and gentle ROM for tone. Also practiced donning/doffing resting hand splint and educated on wearing schedule. Pt left seated in wc at end of session with daughter present and safety belt on.   Therapy Documentation Precautions:  Precautions Precautions: Fall Restrictions Weight Bearing Restrictions: No Pain: Pain Assessment Pain Scale: 0-10 Pain Score: 0-No pain ADL: ADL ADL Comments: Please see functional navigator  See Function Navigator for Current Functional Status.   Therapy/Group: Individual Therapy  Mal Amabilelisabeth S Jovanni Rash 09/24/2017, 10:02 AM

## 2017-09-25 ENCOUNTER — Inpatient Hospital Stay (HOSPITAL_COMMUNITY): Payer: Self-pay

## 2017-09-25 ENCOUNTER — Inpatient Hospital Stay (HOSPITAL_COMMUNITY): Payer: Self-pay | Admitting: Occupational Therapy

## 2017-09-25 ENCOUNTER — Inpatient Hospital Stay (HOSPITAL_COMMUNITY): Payer: Self-pay | Admitting: Speech Pathology

## 2017-09-25 LAB — GLUCOSE, CAPILLARY
GLUCOSE-CAPILLARY: 98 mg/dL (ref 70–99)
GLUCOSE-CAPILLARY: 69 mg/dL — AB (ref 70–99)
GLUCOSE-CAPILLARY: 78 mg/dL (ref 70–99)
Glucose-Capillary: 209 mg/dL — ABNORMAL HIGH (ref 70–99)
Glucose-Capillary: 74 mg/dL (ref 70–99)

## 2017-09-25 NOTE — Significant Event (Signed)
Hypoglycemic Event  CBG: 69  Treatment: 15 GM carbohydrate snack - pt eating lunch  Symptoms: None  Follow-up CBG: Time:1200 CBG Result: 78 pt ate 100% of lunch  Possible Reasons for Event: Unknown  Comments/MD notified: Malissa Hippoan A, PA    Carla Bauer

## 2017-09-25 NOTE — Progress Notes (Signed)
Subjective/Complaints: Pt seen lying in bed this AM.  No reported issues overnight.  Spontaneous speech appears to be improving.    ROS: limited due to cognitive/behavioral, but appears to deny CP, SOB, nausea, vomiting, diarrhea.  Objective: Vital Signs: Blood pressure 107/79, pulse (!) 106, temperature 99 F (37.2 C), temperature source Oral, resp. rate 18, height 5' 1"  (1.549 m), weight 68.2 kg (150 lb 5.7 oz), SpO2 100 %. No results found. Results for orders placed or performed during the hospital encounter of 09/03/17 (from the past 72 hour(s))  Glucose, capillary     Status: Abnormal   Collection Time: 09/22/17 12:10 PM  Result Value Ref Range   Glucose-Capillary 100 (H) 65 - 99 mg/dL  Glucose, capillary     Status: Abnormal   Collection Time: 09/22/17  4:34 PM  Result Value Ref Range   Glucose-Capillary 149 (H) 65 - 99 mg/dL  Glucose, capillary     Status: Abnormal   Collection Time: 09/22/17  9:26 PM  Result Value Ref Range   Glucose-Capillary 102 (H) 65 - 99 mg/dL   Comment 1 Notify RN   Glucose, capillary     Status: None   Collection Time: 09/23/17  6:56 AM  Result Value Ref Range   Glucose-Capillary 67 65 - 99 mg/dL  Glucose, capillary     Status: None   Collection Time: 09/23/17  7:37 AM  Result Value Ref Range   Glucose-Capillary 95 65 - 99 mg/dL  Glucose, capillary     Status: Abnormal   Collection Time: 09/23/17 11:48 AM  Result Value Ref Range   Glucose-Capillary 110 (H) 65 - 99 mg/dL  Glucose, capillary     Status: Abnormal   Collection Time: 09/23/17  4:59 PM  Result Value Ref Range   Glucose-Capillary 117 (H) 65 - 99 mg/dL  Glucose, capillary     Status: Abnormal   Collection Time: 09/23/17  9:26 PM  Result Value Ref Range   Glucose-Capillary 106 (H) 65 - 99 mg/dL  Glucose, capillary     Status: None   Collection Time: 09/24/17  6:12 AM  Result Value Ref Range   Glucose-Capillary 83 70 - 99 mg/dL  Basic metabolic panel     Status: Abnormal   Collection Time: 09/24/17  7:17 AM  Result Value Ref Range   Sodium 147 (H) 135 - 145 mmol/L   Potassium 4.1 3.5 - 5.1 mmol/L   Chloride 110 98 - 111 mmol/L   CO2 31 22 - 32 mmol/L   Glucose, Bld 87 70 - 99 mg/dL   BUN 12 8 - 23 mg/dL   Creatinine, Ser 0.60 0.44 - 1.00 mg/dL   Calcium 9.6 8.9 - 10.3 mg/dL   GFR calc non Af Amer >60 >60 mL/min   GFR calc Af Amer >60 >60 mL/min    Comment: (NOTE) The eGFR has been calculated using the CKD EPI equation. This calculation has not been validated in all clinical situations. eGFR's persistently <60 mL/min signify possible Chronic Kidney Disease.    Anion gap 6 5 - 15    Comment: Performed at [facility] [address], [place], [place] [address]  CBC with Differential/Platelet     Status: Abnormal   Collection Time: 09/24/17  7:17 AM  Result Value Ref Range   WBC 8.8 4.0 - 10.5 K/uL   RBC 2.97 (L) 3.87 - 5.11 MIL/uL   Hemoglobin 8.8 (L) 12.0 - 15.0 g/dL   HCT 29.0 (L) 36.0 - 46.0 %  MCV 97.6 78.0 - 100.0 fL   MCH 29.6 26.0 - 34.0 pg   MCHC 30.3 30.0 - 36.0 g/dL   RDW 15.2 11.5 - 15.5 %   Platelets 363 150 - 400 K/uL   Neutrophils Relative % 58 %   Neutro Abs 5.1 1.7 - 7.7 K/uL   Lymphocytes Relative 31 %   Lymphs Abs 2.7 0.7 - 4.0 K/uL   Monocytes Relative 8 %   Monocytes Absolute 0.7 0.1 - 1.0 K/uL   Eosinophils Relative 3 %   Eosinophils Absolute 0.2 0.0 - 0.7 K/uL   Basophils Relative 1 %   Basophils Absolute 0.0 0.0 - 0.1 K/uL   Immature Granulocytes 1 %   Abs Immature Granulocytes 0.1 0.0 - 0.1 K/uL    Comment: Performed at [facility] [address], [place], [place] [address]  Glucose, capillary     Status: Abnormal   Collection Time: 09/24/17 11:52 AM  Result Value Ref Range   Glucose-Capillary 141 (H) 70 - 99 mg/dL  Glucose, capillary     Status: None   Collection Time: 09/24/17  5:07 PM  Result Value Ref Range   Glucose-Capillary 91 70 - 99 mg/dL  Glucose, capillary     Status: Abnormal    Collection Time: 09/24/17  9:29 PM  Result Value Ref Range   Glucose-Capillary 150 (H) 70 - 99 mg/dL  Glucose, capillary     Status: None   Collection Time: 09/25/17  6:40 AM  Result Value Ref Range   Glucose-Capillary 98 70 - 99 mg/dL  Glucose, capillary     Status: Abnormal   Collection Time: 09/25/17 11:36 AM  Result Value Ref Range   Glucose-Capillary 69 (L) 70 - 99 mg/dL   Comment 1 Notify RN     Physical exam: Constitutional: No distress . Vital signs reviewed. HENT: Normocephalic.  Atraumatic. Eyes: EOMI. No discharge. Cardiovascular: +tachycardic. Regular rhythm.. No JVD. Respiratory: CTA Bilaterally. Normal effort. GI: BS +. Non-distended. Musc: No edema or tenderness in extremities. Neuro: Alert and oriented x1. Aphasia, ? Improving Motor: Right upper extremity: 1/5 shoulder abduction, distally 0/5 with tone at wrist (stable) Right lower extremity: Hip flexion 2+/5, ?distally 0/5 tone at ankle (stable) Skin:   Wound well healed incision Left crani   Assessment/Plan: 1. 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 Physiatrist is providing close team supervision and 24 hour management of active medical problems listed below. Physiatrist and rehab team continue to assess barriers to discharge/monitor patient progress toward functional and medical goals. FIM: Function - Bathing Position: Wheelchair/chair at sink Body parts bathed by patient: Abdomen, Chest, Front perineal area, Right upper leg, Left upper leg, Left lower leg, Buttocks, Right arm Body parts bathed by helper: Left arm, Right arm, Right lower leg, Back Assist Level: Touching or steadying assistance(Pt > 75%)  Function- Upper Body Dressing/Undressing What is the patient wearing?: Pull over shirt/dress Pull over shirt/dress - Perfomed by patient: Thread/unthread  left sleeve, Put head through opening, Pull shirt over trunk Pull over shirt/dress - Perfomed by helper: Thread/unthread right sleeve Assist Level: Touching or steadying assistance(Pt > 75%) Function - Lower Body Dressing/Undressing What is the patient wearing?: Pants, Non-skid slipper socks Position: Wheelchair/chair at sink Pants- Performed by patient: Thread/unthread left pants leg, Pull pants up/down Pants- Performed by helper: Thread/unthread right pants leg Non-skid slipper socks- Performed by patient:  Don/doff left sock Non-skid slipper socks- Performed by helper: Don/doff right sock Socks - Performed by helper: Don/doff left sock, Don/doff right sock Shoes - Performed by helper: Don/doff left shoe, Don/doff right shoe, Fasten left, Fasten right Assist for footwear: Partial/moderate assist Assist for lower body dressing: Touching or steadying assistance (Pt > 75%)  Function - Toileting Toileting activity did not occur: No continent bowel/bladder event Toileting steps completed by patient: Adjust clothing prior to toileting, Adjust clothing after toileting Toileting steps completed by helper: Performs perineal hygiene, Adjust clothing after toileting Assist level: Touching or steadying assistance (Pt.75%)  Function - Air cabin crew transfer activity did not occur: Safety/medical concerns Toilet transfer assistive device: Elevated toilet seat/BSC over toilet Mechanical lift: Stedy Assist level to toilet: Touching or steadying assistance (Pt > 75%) Assist level from toilet: Moderate assist (Pt 50 - 74%/lift or lower)  Function - Chair/bed transfer Chair/bed transfer method: Stand pivot Chair/bed transfer assist level: Moderate assist (Pt 50 - 74%/lift or lower) Chair/bed transfer assistive device: Armrests Mechanical lift: Stedy Chair/bed transfer details: Manual facilitation for weight shifting, Manual facilitation for placement, Verbal cues for technique, Verbal cues  for precautions/safety  Function - Locomotion: Wheelchair Will patient use wheelchair at discharge?: Yes Type: Manual Max wheelchair distance: >150' Assist Level: Touching or steadying assistance (Pt > 75%) Assist Level: Touching or steadying assistance (Pt > 75%) Assist Level: Touching or steadying assistance (Pt > 75%) Turns around,maneuvers to table,bed, and toilet,negotiates 3% grade,maneuvers on rugs and over doorsills: No Function - Locomotion: Ambulation Ambulation activity did not occur: Safety/medical concerns Assistive device: Walker-rolling, Orthosis Max distance: 30' Assist level: Moderate assist (Pt 50 - 74%) Assist level: Moderate assist (Pt 50 - 74%)  Function - Comprehension Comprehension: Auditory Comprehension assist level: Understands basic 50 - 74% of the time/ requires cueing 25 - 49% of the time  Function - Expression Expression: Verbal Expression assist level: Expresses basic 50 - 74% of the time/requires cueing 25 - 49% of the time. Needs to repeat parts of sentences.  Function - Social Interaction Social Interaction assist level: Interacts appropriately 50 - 74% of the time - May be physically or verbally inappropriate.  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 DVT Prophylaxis/Anticoagulation:  Subcutaneous heparin. LE vascular study negative 3. Pain Management:Tylenol as needed 4. Mood:Provide emotional support  Added scheduled Seroquel at night-reduced dose secondary to sedation   Added as needed Seroquel for agitation.  Want to avoid oversedation 5. Neuropsych: This patientis notcapable of making decisions on herown behalf. 6. Skin/Wound Care:Routine skin checks 7. Fluids/Electrolytes/Nutrition:Routine in and outs  PEG tube placed, tolerating bolus feeds 8.Dysphagia. Dysphagia #1 nectar liquids s/p PEG 9.Hyperglycemia induced from tube feeds. Continue Lantus insulin for coverage. CBG (last 3)  Recent Labs    09/24/17 2129 09/25/17 0640 09/25/17 1136  GLUCAP 150* 98 69*   Labile on 6/26 10.Hypertension. Lopressor  Vitals:   09/24/17 2012 09/25/17 0450  BP: 122/90 107/79  Pulse: (!) 108 (!) 106  Resp: 18 18  Temp: 99 F (37.2 C) 99 F (37.2 C)  SpO2: 100% 100%   Relatively controlled on 6/26 11.  Anemia: Baseline Hgb ?11.  Hemoglobin 8.8 on 6/25  Will plant to order labs for Friday  Continue to monitor 12.  Agitation due to confusion, balancing control of agitation with sedation  13. Sinus tachy- likely multifactorial anemia, not hypoxic ,6/12 normal BMET Lopressor was increased to 100 mg twice a day on 09/15/2017 14. Hypernatremia  Sodium 147 on 6/25  Will plant to order labs for Friday  Continue to monitor, will adjust free water flushes if necessary   LOS (Days) 22 A FACE TO FACE EVALUATION WAS PERFORMED  Riyansh Gerstner Lorie Phenix 09/25/2017, 11:48 AM

## 2017-09-25 NOTE — Progress Notes (Signed)
Occupational Therapy Session Note  Patient Details  Name: Carla Bauer MRN: [id number] Date of Birth: 1955-11-14  Today's Date: 09/25/2017 OT Individual Time: 0802-0900 OT Individual Time Calculation (min): 58 min   Short Term Goals: Week 3:  OT Short Term Goal 1 (Week 3): STG=LTG 2/2 ELOS  Skilled Therapeutic Interventions/Progress Updates:    Pt greeted semi-reclined in bed, awake and agreeable to OT. OT asked pt if she would like to shower today and she said yes. Pt came to sitting EOB on L side with min A. Pt brought into shower and completed stand-pivot to shower bench on R side with mod A and facilitation for head/hips relationship. Pt followed commands to was body parts as instructed. OT provided hand over hand A to integrate R UE into bathing tasks with increased pectoral tone. Min A stand-pivot out of shower to stronger L side. Dressing completed wc at the sink with focus on R attention, sit<>stand, and standing balance. Max multimodal cues for hemi-dressing techniques but improved overall attention to R side of body. Multiple sit<>stands with min/mod A, then pt able to pull pants up over hips with OT providing mod A for dynamic balance. Pt reported need to urinate, so stand-pivot completed to toilet with min A to L side and Mod A back to the R. Successful continent void. Pt able to complete peri-care with OT providing mod A for balance. Pt left seated in wc at end of session with safety belt on and needs met.   Therapy Documentation Precautions:  Precautions Precautions: Fall Restrictions Weight Bearing Restrictions: No Pain:  none/denies pain ADL: ADL ADL Comments: Please see functional navigator  See Function Navigator for Current Functional Status.   Therapy/Group: Individual Therapy  Valma Cava 09/25/2017, 8:43 AM

## 2017-09-25 NOTE — Progress Notes (Signed)
Speech Language Pathology Daily Session Note  Patient Details  Name: Carla Bauer MRN: [id number] Date of Birth: 06-26-1955  Today's Date: 09/25/2017 SLP Individual Time: 1030-1100 SLP Individual Time Calculation (min): 30 min  Short Term Goals: Week 3: SLP Short Term Goal 1 (Week 3): Patient will consume trials of thin liquids via tsp/cup with minimal overt s/s of aspiration with Mod A verbal cues over 2 sessions to assess readiness for repeat MBS.  SLP Short Term Goal 2 (Week 3): Patient will consume dys 1 textures and nectar thick liquids with Mod A multimodal cues for use of swallowing precautions and no overt s/s of aspiration.  SLP Short Term Goal 3 (Week 3): Patient will orient to place and situation with Max A multimodal cues.  SLP Short Term Goal 4 (Week 3): Pt will locate items at midline in 75% of opportunities during basic, structured tasks Mod A multimodal cues.  SLP Short Term Goal 5 (Week 3): Patient will demonstrate sustained attention to functional tasks for 5 minutes with Max A verbal cues for redirection.  SLP Short Term Goal 6 (Week 3): Patient will initiate functional tasks with Mod A verbal cues.   Skilled Therapeutic Interventions:  Pt was seen for skilled ST targeting cognitive goals.  SLP was seated upright in wheelchair, pleasantly confused, and agreeable to participate in therapies.  SLP facilitated the session with a basic card game to address visual scanning, sustained attention, and task initiation.  Pt was able to locate cards at midline in 100% of opportunities with min-mod verbal and visual cues.  She sustained her attention to task for its duration (~15 minutes) with intermittent supervision cues for redirection.  Pt was left in wheelchair with chair alarm set, quick release belt donned, and telesitter on.  Continue per current plan of care.    Function:  Eating Eating   Modified Consistency Diet: Yes Eating Assist Level: Supervision or verbal cues;Set up  assist for;Helper performs IV, parenteral or tube feed           Cognition Comprehension Comprehension assist level: Understands basic 75 - 89% of the time/ requires cueing 10 - 24% of the time  Expression   Expression assist level: Expresses basic 75 - 89% of the time/requires cueing 10 - 24% of the time. Needs helper to occlude trach/needs to repeat words.  Social Interaction Social Interaction assist level: Interacts appropriately 75 - 89% of the time - Needs redirection for appropriate language or to initiate interaction.  Problem Solving Problem solving assist level: Solves basic 50 - 74% of the time/requires cueing 25 - 49% of the time  Memory Memory assist level: Recognizes or recalls 25 - 49% of the time/requires cueing 50 - 75% of the time    Pain Pain Assessment Pain Scale: 0-10 Pain Score: 0-No pain  Therapy/Group: Individual Therapy  Graceson Nichelson, Melanee SpryNicole L 09/25/2017, 11:53 AM

## 2017-09-25 NOTE — Progress Notes (Signed)
Physical Therapy Session Note  Patient Details  Name: Carla Bauer Start MRN: [id number] Date of Birth: 1955/10/29  Today's Date: 09/25/2017 PT Individual Time: 1300-1400 PT Individual Time Calculation (min): 60 min   Short Term Goals: Week 3:  PT Short Term Goal 1 (Week 3): STG=LTG   Skilled Therapeutic Interventions/Progress Updates:    Pt seated in w/c upon PT arrival, agreeable to therapy tx and denies pain. Pt transported to the gym. Pt participated in orthotic consult this session for R AFO. Therapist donned shoes, socks and AFO total assist for time management. Pt ambulated x 15 ft with RW and R AFO, mod assist for postural control and RW management. Pt ambulated x 30 ft this session in litegait for BWS overground training, therapist provided manual facilitation for lateral weightshifting and HHA. After first trial pt reports "I'm done with this." Unable to attempt further ambulation. Pt transported to dayroom, pt performed stand pivot transfer from w/c>nustep with mod assist. Pt used nustep x 5 minutes for neuro re-ed using B UEs/LEs for reciprocal movement while listening to music to increase participation. Pt transferred back to w/c mod assist stand pivot. Pt seated in w/c worked on R LE NMR to kick ball back and forth, x 10 kicks. Pt transported back to room and left seated with needs in reach and chair alarm set.    Therapy Documentation Precautions:  Precautions Precautions: Fall Restrictions Weight Bearing Restrictions: No   See Function Navigator for Current Functional Status.   Therapy/Group: Individual Therapy  Cresenciano GenreEmily van Schagen, PT, DPT 09/25/2017, 7:48 AM

## 2017-09-25 NOTE — Progress Notes (Signed)
Orthopedic Tech Progress Note Patient Details:  Arvid RightDorothy A Berk [birth year] [id number]  Patient ID: Arvid Rightorothy A Thorner, female   DOB: [birth year], 62 y.o.   MRN: [id number]   Nikki DomCrawford, Yani Coventry 09/25/2017, 10:05 AM Called in [facility] brace order; spoke with [facility]

## 2017-09-25 NOTE — Patient Care Conference (Signed)
Inpatient RehabilitationTeam Conference and Plan of Care Update Date: 09/25/2017   Time: 11:20 AM    Patient Name: Carla Bauer      Medical Record Number: [id number]  Date of Birth: 1955/06/30 Sex: Female         Room/Bed: 4W16C/4W16C-01 Payor Info: Payor: MEDICAID PENDING / Plan: MEDICAID PENDING / Product Type: *No Product type* /    Admitting Diagnosis: ICH CORTRAK  Admit Date/Time:  09/03/2017  3:38 PM Admission Comments: No comment available   Primary Diagnosis:  <principal problem not specified> Principal Problem: <principal problem not specified>  Patient Active Problem List   Diagnosis Date Noted  . Hypernatremia   . Dysphagia due to recent stroke   . Spastic hemiparesis (HCC)   . S/P percutaneous endoscopic gastrostomy (PEG) tube placement (HCC)   . Labile blood glucose   . Sinus tachycardia   . Intraparenchymal hematoma of brain (HCC) 09/03/2017  . Right hemiparesis (HCC)   . Oropharyngeal dysphagia   . Essential hypertension   . Acute lower UTI   . Tachypnea   . Tachycardia   . Reactive hypertension   . Prediabetes   . Acute blood loss anemia   . Hypokalemia   . Fever   . Leukocytosis   . Acute respiratory failure with hypoxia (HCC)   . 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 ICH (intracerebral hemorrhage) (HCC) 08/14/2017  . SAH (subarachnoid hemorrhage) (HCC) 08/14/2017  . 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 Hydrocephalus 08/14/2017  . Brain herniation (HCC) 08/14/2017  . Cytotoxic brain edema (HCC) 08/14/2017    Expected Discharge Date: Expected Discharge Date: 09/27/17  Team Members Present: Physician leading conference: Dr. Maryla MorrowAnkit Patel Social Worker Present: Carla DerBecky Monzerrat Wellen, LCSW Nurse Present: Other (comment)(Carla Bauer) PT Present: Carla Bauer, PT OT Present: Carla Bauer, OT;Carla Bauer, COTA SLP Present: Carla Bauer, SLP PPS  Coordinator present : Carla DuckMarie Noel, RN, CRRN     Current Status/Progress Goal Weekly Team Focus  Medical   Decreased functional mobility with right-sided weakness secondary to intraparenchymal hematoma in the medial left temporal lobe status post left craniotomy clipping of aneurysm placement of right frontal external ventricular drain 08/14/2017, severe cognitive deficits  Improve mobility, work on aphasia, follow labs, dysphagia  See above   Bowel/Bladder   INC of bowel and bladder  To be to call for assistance to bathroom.   Will clean and dry pt as needed.    Swallow/Nutrition/ Hydration   Dys 1, nectar thick liquids, mod-max cues   min assist   continue use of swallowing precautions, completion of family education    ADL's   MIn/mod A bathing showe rlevel, Min A UB dressing, Min/mod A LB dressing, min A toilet transfer to L Mod A to R  Min/mod A goals  R NMR, functional transfers, pt/family education, R attention , R visual scanning   Mobility   mod assist overall transfers and gait at rail or w/ RW  min assist  discharge planning and family education, gait w/ RAFO and RW   Communication   mod assist, impacted by cognition and language of confusion   mod assist   completion of family education   Safety/Cognition/ Behavioral Observations  max assist   mod assist   attention, orientation, safety awareness, recall of daily information    Pain             Skin   Incision  to head healed but PT keeps kicking  at the front.      For all wounds to remain healed.       *See Care Plan and progress notes for long and short-term goals.     Barriers to Discharge  Current Status/Progress Possible Resolutions Date Resolved   Physician    Medical stability     See above  Therapies, PEG feeds, optimize DM meds, follow labs      Nursing                  PT  Behavior  Agitation continues to improve              OT                  SLP                SW                Discharge  Planning/Teaching Needs:  Carla Bauer has been here to go throguh therpaies and learn Mom's care. Planning to take home Friday 6/28. Trying to get her other daughter Carla Bauer to coem in without success thus far.      Team Discussion:  Pt is making progress this week participating more and more pleasant. Daughter has ben here for family education this week in anticipation of discharge Friday, Po intake better less tube feedings needed. Looking to the right ore and more continenent. DM meds adjusted and watching sodium level. Needs to slow down and take her time in therapies, at times impulsive.  Revisions to Treatment Plan:  DC 6/28    Continued Need for Acute Rehabilitation Level of Care: The patient requires daily medical management by a physician with specialized training in physical medicine and rehabilitation for the following conditions: Daily direction of a multidisciplinary physical rehabilitation program to ensure safe treatment while eliciting the highest outcome that is of practical value to the patient.: Yes Daily medical management of patient stability for increased activity during participation in an intensive rehabilitation regime.: Yes Daily analysis of laboratory values and/or radiology reports with any subsequent need for medication adjustment of medical intervention for : Neurological problems;Diabetes problems;Other  Carla Bauer, Carla Bauer 09/25/2017, 2:11 PM

## 2017-09-25 NOTE — Plan of Care (Signed)
  Problem: RH BOWEL ELIMINATION Goal: RH STG MANAGE BOWEL WITH ASSISTANCE Description STG Manage Bowel with Assistance. Mod  Outcome: Progressing   Problem: RH BLADDER ELIMINATION Goal: RH STG MANAGE BLADDER WITH ASSISTANCE Description STG Manage Bladder With Assistance. Mod  Outcome: Progressing Goal: RH STG MANAGE BLADDER WITH EQUIPMENT WITH ASSISTANCE Description STG Manage Bladder With Equipment With Assistance. MOD  Outcome: Progressing   Problem: RH COGNITION-NURSING Goal: RH STG USES MEMORY AIDS/STRATEGIES W/ASSIST TO PROBLEM SOLVE Description STG Uses Memory Aids/Strategies With mod  Assistance to Problem Solve. Cues and supervision   Outcome: Progressing   Problem: Consults Goal: RH STROKE PATIENT EDUCATION Description See Patient Education module for education specifics  Outcome: Progressing   

## 2017-09-26 ENCOUNTER — Inpatient Hospital Stay (HOSPITAL_COMMUNITY): Payer: Self-pay | Admitting: Occupational Therapy

## 2017-09-26 ENCOUNTER — Inpatient Hospital Stay (HOSPITAL_COMMUNITY): Payer: Self-pay | Admitting: Speech Pathology

## 2017-09-26 ENCOUNTER — Inpatient Hospital Stay (HOSPITAL_COMMUNITY): Payer: Self-pay | Admitting: Physical Therapy

## 2017-09-26 DIAGNOSIS — I10 Essential (primary) hypertension: Secondary | ICD-10-CM

## 2017-09-26 LAB — GLUCOSE, CAPILLARY
GLUCOSE-CAPILLARY: 203 mg/dL — AB (ref 70–99)
Glucose-Capillary: 83 mg/dL (ref 70–99)
Glucose-Capillary: 86 mg/dL (ref 70–99)
Glucose-Capillary: 132 mg/dL — ABNORMAL HIGH (ref 70–99)
Glucose-Capillary: 70 mg/dL (ref 70–99)

## 2017-09-26 LAB — BASIC METABOLIC PANEL
Anion gap: 9 (ref 5–15)
BUN: 13 mg/dL (ref 8–23)
CHLORIDE: 107 mmol/L (ref 98–111)
CO2: 28 mmol/L (ref 22–32)
CREATININE: 0.61 mg/dL (ref 0.44–1.00)
Calcium: 9.4 mg/dL (ref 8.9–10.3)
GFR calc Af Amer: >60 mL/min (ref >60)
GFR calc non Af Amer: >60 mL/min (ref >60)
Glucose, Bld: 159 mg/dL — ABNORMAL HIGH (ref 70–99)
POTASSIUM: 3.6 mmol/L (ref 3.5–5.1)
Sodium: 144 mmol/L (ref 135–145)

## 2017-09-26 LAB — CBC
HEMATOCRIT: 29.6 % — AB (ref 36.0–46.0)
HEMOGLOBIN: 9 g/dL — AB (ref 12.0–15.0)
MCH: 30.1 pg (ref 26.0–34.0)
MCHC: 30.4 g/dL (ref 30.0–36.0)
MCV: 99 fL (ref 78.0–100.0)
Platelets: 404 10*3/uL — ABNORMAL HIGH (ref 150–400)
RBC: 2.99 MIL/uL — AB (ref 3.87–5.11)
RDW: 15.5 % (ref 11.5–15.5)
WBC: 9.6 10*3/uL (ref 4.0–10.5)

## 2017-09-26 MED ORDER — FREE WATER: 200.0000 mL | Freq: Every day | Status: DC

## 2017-09-26 MED ORDER — QUETIAPINE FUMARATE 25 MG PO TABS: 25.0000 mg | ORAL_TABLET | Freq: Every day | ORAL | 0 refills | Status: DC

## 2017-09-26 MED ORDER — RESOURCE THICKENUP CLEAR PO POWD: 1.0000 | ORAL | 1 refills | Status: DC | PRN

## 2017-09-26 MED ORDER — TAMSULOSIN HCL 0.4 MG PO CAPS: 0.4000 mg | ORAL_CAPSULE | Freq: Every day | ORAL | 1 refills | Status: DC

## 2017-09-26 MED ORDER — INSULIN GLARGINE 100 UNITS/ML SOLOSTAR PEN: 20.0000 [IU] | PEN_INJECTOR | Freq: Every day | SUBCUTANEOUS | 11 refills | Status: DC

## 2017-09-26 MED ORDER — CALCIUM POLYCARBOPHIL 625 MG PO TABS: 625.0000 mg | ORAL_TABLET | Freq: Every day | ORAL | Status: DC

## 2017-09-26 MED ORDER — METOPROLOL TARTRATE 25 MG/10 ML ORAL SUSPENSION: 100.0000 mg | Freq: Two times a day (BID) | ORAL | 1 refills | Status: DC

## 2017-09-26 MED ORDER — OSMOLITE 1.5 CAL PO LIQD: 240.0000 mL | Freq: Four times a day (QID) | ORAL | 0 refills | Status: DC

## 2017-09-26 MED ORDER — PRO-STAT SUGAR FREE PO LIQD: 30.0000 mL | Freq: Three times a day (TID) | ORAL | 0 refills | Status: DC

## 2017-09-26 NOTE — Discharge Summary (Signed)
Discharge summary job [id number]

## 2017-09-26 NOTE — Progress Notes (Addendum)
Speech Language Pathology Daily Session Note  Patient Details  Name: Carla Bauer A Lichtenwalner MRN: [id number] Date of Birth: 06/17/1955  Today's Date: 09/26/2017 SLP Individual Time: 1500-1530 SLP Individual Time Calculation (min): 30 min  Short Term Goals: Week 3: SLP Short Term Goal 1 (Week 3): Patient will consume trials of thin liquids via tsp/cup with minimal overt s/s of aspiration with Mod A verbal cues over 2 sessions to assess readiness for repeat MBS.  SLP Short Term Goal 2 (Week 3): Patient will consume dys 1 textures and nectar thick liquids with Mod A multimodal cues for use of swallowing precautions and no overt s/s of aspiration.  SLP Short Term Goal 3 (Week 3): Patient will orient to place and situation with Max A multimodal cues.  SLP Short Term Goal 4 (Week 3): Pt will locate items at midline in 75% of opportunities during basic, structured tasks Mod A multimodal cues.  SLP Short Term Goal 5 (Week 3): Patient will demonstrate sustained attention to functional tasks for 5 minutes with Max A verbal cues for redirection.  SLP Short Term Goal 6 (Week 3): Patient will initiate functional tasks with Mod A verbal cues.   Skilled Therapeutic Interventions:Skilled ST services focused on cognitive and swallow skills. Pt required max A verbal cues for orientation to place, situation and time. SLP facilitated task initiation, scanning and sustained attention utilizing basic task with colored shapes, pt required min A verbal cues to locate shapes at midline and the right, initiated task with min A verbal cues and demonstrated sustained attention for 10 minutes with max-mod A verbal cues. Pt continues to demonstrate language of confusion, inconsistent naming correct shapes and colors given max A verbal cues. SLP facilitated PO consumption of thin liquid trial via cup, follow oral care, pt agreed to limited sips (3) no overt s/s aspiration and max-mod A verbal to consume small sips. Pt was left in room  with call bell within reach and chiar alarm set.      Function:  Eating Eating   Modified Consistency Diet: No(thin trial with ST only) Eating Assist Level: Supervision or verbal cues;Set up assist for           Cognition Comprehension Comprehension assist level: Understands basic 50 - 74% of the time/ requires cueing 25 - 49% of the time  Expression   Expression assist level: Expresses basic 25 - 49% of the time/requires cueing 50 - 75% of the time. Uses single words/gestures.  Social Interaction Social Interaction assist level: Interacts appropriately 25 - 49% of time - Needs frequent redirection.  Problem Solving Problem solving assist level: Solves basic 25 - 49% of the time - needs direction more than half the time to initiate, plan or complete simple activities  Memory Memory assist level: Recognizes or recalls less than 25% of the time/requires cueing greater than 75% of the time    Pain Pain Assessment Pain Score: 0-No pain  Therapy/Group: Individual Therapy  Oanh Devivo  [facility] 09/26/2017, 3:30 PM

## 2017-09-26 NOTE — Progress Notes (Signed)
Occupational Therapy Session Note  Patient Details  Name: Arvid RightDorothy A Saefong MRN: [id number] Date of Birth: [birth year]  Today's Date: 09/26/2017 OT Individual Time: [id number] OT Individual Time Calculation (min): 60 min    Short Term Goals: Week 3:  OT Short Term Goal 1 (Week 3): STG=LTG 2/2 ELOS  Skilled Therapeutic Interventions/Progress Updates:    1:1 When arrived pt being transfer into bathroom with STEDY to void. Pt already had been incontinent of bowel on the way in her brief. Pt able to finish voiding on the toilet while maintaining her sitting balance with occasional left UE support.  [facility] shower bed in shower with stedy. Pt able to maintain static sitting balance with supervision but required min A for dynamic sitting balance to prevent falling to the right. Pt still presents with decr balance reactions. Pt performed sit to stand with grab bar in shower with min A (however decr weight bearing through right LE) to wash periarea and bottom. Pt participated in dressing sit to stand at w/c level with recall with min questioning cues and setup of hemi dressing techiques and with extra time. Pt required min A to come into standing and mod to maintain for clothing management. Total A for donning socks and shoes but did participate in pulling up sock once placed over her toes.   Engaged in propelling w/c short distances with bilateral feet with mod to max A for right knee extension to kick out leg to advance it forward.  Also practiced sit to stands at the wall with rail with pushing up on the arm rest on the left and with focus on maintaining weight at midline (weightbearing on the right with knee stabilized). Pt did required max cues for hand placement during transitional movements.   Therapy Documentation Precautions:  Precautions Precautions: Fall Restrictions Weight Bearing Restrictions: No   Pain:  no c/o pain  ADL: ADL ADL Comments: Please see  functional navigator Vision Baseline Vision/History: No visual deficits Patient Visual Report: No change from baseline Vision Assessment?: Yes Eye Alignment: Impaired (comment)(L gaze preference) Ocular Range of Motion: Within Functional Limits Alignment/Gaze Preference: Gaze left Tracking/Visual Pursuits: Requires cues, head turns, or add eye shifts to track Perception  Perception: Impaired Inattention/Neglect: Does not attend to right visual field;Does not attend to right side of body Praxis Praxis: Impaired Praxis Impairment Details: Motor planning;Ideomotor;Initiation Exercises:   Other Treatments:    See Function Navigator for Current Functional Status.   Therapy/Group: Individual Therapy  Roney MansSmith, Saryn Cherry [facility] 09/26/2017, 2:44 PM

## 2017-09-26 NOTE — Progress Notes (Signed)
Social Work Patient ID: Carla Bauer, female   DOB: 1955/05/03, 62 y.o.   MRN: [id number]  Left message for Carla Bauer and [facility] and plan still to discharge tomorrow. Pt is doing better and looking forward to going home.She feels she will sleep better and overall be happier. Carla Bauer to be here tonight to stay the night with pt. Work on discharge for tomorrow.

## 2017-09-26 NOTE — Progress Notes (Addendum)
Physical Therapy Discharge Summary  Patient Details  Name: Carla Bauer MRN: [id number] Date of Birth: September 01, 1955  Today's Date: 09/26/2017 PT Individual Time: [id number] AND 1315-1330 AND 1535-1550 PT Individual Time Calculation (min): 25 min AND 15 min AND 15 min  Pt missed time: 45 min (unwilling to participate)  Session 1:  Pt in w/c and immediately requesting coffee before participating in therapy with this therapist. Provided w/ thickened coffee and performed assess d/c strength, sensation, vision, and cognition prior to leaving room. Pt self-propelled w/c 50' x2 in controlled environment w/ supervision and max verbal and visual cues for technique. Ended session in w/c and in care of NT at nurses station while pt finished her coffee. All needs met.   Session 2:  Pt w/ increased agitation this afternoon. Pt taken out of room, total assist in w/c, pt continued to state "I'm done", "I'm tired", and "I don't feel like doing anything". Brought to gym w/ therapy tech present, encouraging pt to participate. Pt continued to refuse any activity. Did not attempt to facilitate pt's participation w/ manual assist 2/2 bouts of combative agitation in the past. Pt repeatedly stated "take me back to my room". Returned to room and ended session in w/c, call bell within reach and all needs met. Quick release belt and chair alarm engaged. Missed 45 min of skilled PT 2/2 agitation/refusal. Mobility and locomotion d/c status taken from pt's level of assist over last few therapy sessions as pt was unwilling to participate in mobility and locomotion this session.   Session 3: Attempted to see pt for make up time from session earlier this date. Pt continued to refuse and become agitated when attempting to perform out-of-chair activity w/ pt in room or while in therapy gym. Pt was agreeable for this therapist to assess RAFO fit in her R shoe as OT reported they had trouble putting it on this morning. Pt w/ increased  agitation after this, stating "it's too tight" repeatedly. This is despite wearing the same AFO in the same shoes previously. Ended session in w/c, call bell within reach and all needs met.   Patient has met 4 of 7 long term goals due to improved activity tolerance, improved balance, improved postural control, increased strength, ability to compensate for deficits, improved attention, improved awareness and improved coordination.  Patient to discharge at a wheelchair level [place].   [name] care partner is independent to provide the necessary physical assistance at discharge.   Reasons goals not met: Pt limited in function at d/c 2/2 poor participation in therapy sessions during this rehab admission. She continues to require mod-max assist w/ car transfers 2/2 refusing to practice as well as limited in w/c and gait mobility 2/2 endurance and attention deficits.  Recommendation:  Patient will benefit from ongoing skilled PT services in home health setting to continue to advance safe functional mobility, address ongoing impairments in functional mobility, transfers, gait, safety awareness, R inattention, coordination, and postural control, and minimize fall risk.  Equipment: w/c and RW  Reasons for discharge: treatment goals met and discharge from hospital  Patient/family agrees with progress made and goals achieved: Yes  PT Discharge Precautions/Restrictions Precautions Precautions: Fall Restrictions Weight Bearing Restrictions: No Pain Pain Assessment Pain Scale: 0-10 Pain Score: 0-No pain Faces Pain Scale: No hurt Vision/Perception  Vision - Assessment Eye Alignment: Impaired (comment)(L gaze preference) Ocular Range of Motion: Within Functional Limits Alignment/Gaze Preference: Gaze left Tracking/Visual Pursuits: Requires cues, head turns, or add eye shifts to  track Perception Perception: Impaired Inattention/Neglect: Does not attend to right visual field;Does not attend to  right side of body Praxis Praxis: Impaired Praxis Impairment Details: Motor planning;Ideomotor;Initiation  Cognition Overall Cognitive Status: Impaired/Different from baseline Arousal/Alertness: Awake/alert Orientation Level: Disoriented to time;Disoriented to situation;Oriented to person;Disoriented to place Attention: Sustained Sustained Attention: Impaired Memory: Impaired Awareness: Impaired Problem Solving: Impaired Behaviors: Restless;Impulsive;Verbal agitation;Physical agitation Safety/Judgment: Impaired Comments: right neglect Sensation Sensation Light Touch: Impaired by gross assessment(unable to formally assess 2/2 cognition, pt unaware of therapist touching and lifting pt's RLE) Light Touch Impaired Details: Impaired RLE Coordination Gross Motor Movements are Fluid and Coordinated: No Fine Motor Movements are Fluid and Coordinated: No Motor  Motor Motor: Hemiplegia;Motor apraxia;Abnormal postural alignment and control;Abnormal tone Motor - Discharge Observations: R hemi, mild increase in R hamstring and adductor tone, decreased midline orientation  Mobility Bed Mobility Bed Mobility: Rolling Right;Rolling Left;Supine to Sit;Sit to Supine Rolling Right: Minimal Assistance - Patient > 75% Rolling Left: Minimal Assistance - Patient > 75% Supine to Sit: Minimal Assistance - Patient > 75% Sit to Supine: Minimal Assistance - Patient > 75% Transfers Transfers: Sit to Stand;Stand to Sit;Stand Pivot Transfers Sit to Stand: Minimal Assistance - Patient > 75% Stand to Sit: Minimal Assistance - Patient > 75% Stand Pivot Transfers: Moderate Assistance - Patient 50 - 74% Locomotion  Gait Ambulation: Yes Gait Assistance: Moderate Assistance - Patient 50-74% Gait Distance (Feet): 30 Feet Assistive device: Rolling walker Stairs / Additional Locomotion Stairs: No Wheelchair Mobility Wheelchair Mobility: Yes Wheelchair Assistance: Supervision/Verbal cueing Wheelchair  Propulsion: Left upper extremity;Left lower extremity Wheelchair Parts Management: Needs assistance Distance: 74'  Trunk/Postural Assessment  Cervical Assessment Cervical Assessment: Exceptions to WFL(forward head, rounded shoulder posture) Thoracic Assessment Thoracic Assessment: Exceptions to WFL(kyphotic) Lumbar Assessment Lumbar Assessment: Exceptions to WFL(posterior pelvic tilt) Postural Control Postural Control: Deficits on evaluation Trunk Control: requires UE assist to maintain balance w/ supervision Protective Responses: delayed  Extremity Assessment  RLE Assessment RLE Assessment: Exceptions to [facility] Passive Range of Motion (PROM) Comments: WFL  Active Range of Motion (AROM) Comments: ~50% range 2/2 strength deficits General Strength Comments: 3/5 hip and knee musculature, 0/5 DF  LLE Assessment LLE Assessment: Within Functional Limits   See Function Navigator for Current Functional Status.  Devanshi Califf K Arnette 09/26/2017, 12:14 PM

## 2017-09-26 NOTE — Plan of Care (Signed)
LBM 6/24

## 2017-09-26 NOTE — Discharge Summary (Signed)
[name], [name] MEDICAL RECORD [id number] ACCOUNT [id number] DATE OF BIRTH:November 15, 1955 FACILITY: [facility] LOCATION: [facility] PHYSICIAN:ANDREW Wynn BankerKIRSTEINS, MD  DISCHARGE SUMMARY  DATE OF DISCHARGE:  09/27/2017  ADMIT DATE:  09/03/2017  DISCHARGE DATE:  09/27/2017  DISCHARGE DIAGNOSES: 1.  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 2.  Subcutaneous heparin for deep venous thrombosis prophylaxis.   3.  Dysphagia with gastrostomy tubes 09/16/2017.   4.  Anemia.  HOSPITAL COURSE:  A [age] year old right-handed female with a history of headaches.  Lives with her daughter.  Independent prior to admission.  Presented 08/14/2017 after being found down by family with right-sided weakness.  Cranial CT scan showed left  temporal hemorrhage 4.2 x 3.4 x 2.1 intraparenchymal hematoma in the medial left temporal lobe with intraventricular penetration filling of the left lateral ventricle with blood.  Mass effect with right to left shift.  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  Underwent craniotomy, clipping of aneurysm, evacuation of hematoma.  Placement of right frontal external ventricular drain 08/14/2017 by Dr. Conchita ParisNundkumar.   Multiple attempts to wean her ventricular catheter unsuccessfully.  Underwent placement of laparoscopic assisted right ventriculoperitoneal shunt 08/29/2017.  HOSPITAL COURSE:  UTI treated with Rocephin.  Renal ultrasound showed no hydronephrosis.  Dysphagia #1 with nectar thick liquid diet.  Subcutaneous heparin initiated for DVT prophylaxis 08/19/2017.  The patient was admitted for comprehensive  rehabilitation program.  PAST MEDICAL HISTORY:  See discharge diagnoses.  SOCIAL HISTORY:  Lives with daughter, works as a LawyerCNA.  FUNCTIONAL STATUS:  Upon admission to rehabilitation services was +2 physical assist,  stand pivot transfers, moderate assist sit to stand, max total assist with activities of daily living.  PHYSICAL EXAMINATION: VITAL SIGNS:  Blood pressure 111/69, pulse 89, temperature 97, respirations 16. GENERAL:  Alert female, somewhat anxious, nonverbal but would follow some inconsistent commands.  Craniotomy site clean and dry.  Nasogastric tube in place.  EOMs right eye exhibits no discharge.  Left eye, no discharge. NECK:  Supple, nontender, no JVD. HEART:  Rate controlled. ABDOMEN:  Soft, nontender.  Good bowel sounds. LUNGS:  Clear to auscultation without wheeze.  REHABILITATION HOSPITAL COURSE:  The patient was admitted to inpatient rehabilitation services.  Therapies initiated on a 3-hour daily basis, consisting of physical therapy, occupational therapy, speech therapy and rehabilitation nursing.  The following  issues were addressed during patient's rehabilitation stay:  Pertaining to the patient's intraparenchymal hematoma, craniotomy, aneurysm clipping 08/14/2017, she would follow up with neurosurgery.  Surgical site well healed.  Subcutaneous heparin for DVT  prophylaxis.  Venous Doppler studies negative.  She had bouts of anxiety, continued to improve.  Maintained on low dose Seroquel.  Dysphagia with diet of dysphagia #1 nectar liquids.  A gastrostomy tube was placed 09/16/2017 by interventional radiology  for nutritional support.  Blood pressure is overall controlled on Lopressor.  Bouts of hyperglycemia related to tube feeds.  Insulin therapy as directed.  The patient received weekly collaborative interdisciplinary team conferences to discuss estimated  length of stay, family teaching, any barriers to discharge.  Sessions focused on family education.  The patient participating in sessions, fitted with a right AFO brace.  Ambulates 15 feet rolling walker with a right AFO, moderate assist for postural  control.  She can ambulate up to 30 feet with her sessions working with ongoing  activities of daily living, transfers back to her wheelchair, moderate assist  with stand pivot.  Occupational therapy, the patient sitting can sit at edge of bed.  Completes  stand pivot transfers to the shower bench, moderate assist for facilitation for head, hips and bathing.  She could communicate her needs.  Full family teaching completed and discharge to home.  DISCHARGE MEDICATIONS:  Included aspirin 81 mg p.o. daily, Lantus insulin 20 units at bedtime, Lopressor 100 mg b.i.d. by tube, FiberCon daily, Seroquel 25 mg at bedtime by tube.  Flomax 0.4 mg daily.  Her diet was a dysphagia #1 nectar thick liquid diet as well as Osmolite 240 mL 4 times daily by tube with free water 200 mL 5 times daily by tube.  She would follow up with Dr. Claudette Laws at the outpatient rehab center as directed, Dr. Conchita Paris  neurosurgery call for appointment, Dr. Jolaine Click interventional radiology as needed.  TN/NUANCE D:09/26/2017 T:09/26/2017 JOB:[id number]

## 2017-09-26 NOTE — Progress Notes (Signed)
Subjective/Complaints: Pt seen lying in bed this AM.  No reported issues overnight.  Remains aphasic.   ROS: limited due to aphasia.  Objective: Vital Signs: Blood pressure 130/79, pulse (!) 130, temperature 98.2 F (36.8 C), temperature source Oral, resp. rate 18, height 5' 1"  (1.549 m), weight 68.6 kg (151 lb 3.8 oz), SpO2 100 %. No results found. Results for orders placed or performed during the hospital encounter of 09/03/17 (from the past 72 hour(s))  Glucose, capillary     Status: Abnormal   Collection Time: 09/23/17 11:48 AM  Result Value Ref Range   Glucose-Capillary 110 (H) 65 - 99 mg/dL  Glucose, capillary     Status: Abnormal   Collection Time: 09/23/17  4:59 PM  Result Value Ref Range   Glucose-Capillary 117 (H) 65 - 99 mg/dL  Glucose, capillary     Status: Abnormal   Collection Time: 09/23/17  9:26 PM  Result Value Ref Range   Glucose-Capillary 106 (H) 65 - 99 mg/dL  Glucose, capillary     Status: None   Collection Time: 09/24/17  6:12 AM  Result Value Ref Range   Glucose-Capillary 83 70 - 99 mg/dL  Basic metabolic panel     Status: Abnormal   Collection Time: 09/24/17  7:17 AM  Result Value Ref Range   Sodium 147 (H) 135 - 145 mmol/L   Potassium 4.1 3.5 - 5.1 mmol/L   Chloride 110 98 - 111 mmol/L   CO2 31 22 - 32 mmol/L   Glucose, Bld 87 70 - 99 mg/dL   BUN 12 8 - 23 mg/dL   Creatinine, Ser 0.60 0.44 - 1.00 mg/dL   Calcium 9.6 8.9 - 10.3 mg/dL   GFR calc non Af Amer >60 >60 mL/min   GFR calc Af Amer >60 >60 mL/min    Comment: (NOTE) The eGFR has been calculated using the CKD EPI equation. This calculation has not been validated in all clinical situations. eGFR's persistently <60 mL/min signify possible Chronic Kidney Disease.    Anion gap 6 5 - 15    Comment: Performed at [facility] [address], [place], [place] [address]  CBC with Differential/Platelet     Status: Abnormal   Collection Time: 09/24/17  7:17 AM  Result Value Ref Range    WBC 8.8 4.0 - 10.5 K/uL   RBC 2.97 (L) 3.87 - 5.11 MIL/uL   Hemoglobin 8.8 (L) 12.0 - 15.0 g/dL   HCT 29.0 (L) 36.0 - 46.0 %   MCV 97.6 78.0 - 100.0 fL   MCH 29.6 26.0 - 34.0 pg   MCHC 30.3 30.0 - 36.0 g/dL   RDW 15.2 11.5 - 15.5 %   Platelets 363 150 - 400 K/uL   Neutrophils Relative % 58 %   Neutro Abs 5.1 1.7 - 7.7 K/uL   Lymphocytes Relative 31 %   Lymphs Abs 2.7 0.7 - 4.0 K/uL   Monocytes Relative 8 %   Monocytes Absolute 0.7 0.1 - 1.0 K/uL   Eosinophils Relative 3 %   Eosinophils Absolute 0.2 0.0 - 0.7 K/uL   Basophils Relative 1 %   Basophils Absolute 0.0 0.0 - 0.1 K/uL   Immature Granulocytes 1 %   Abs Immature Granulocytes 0.1 0.0 - 0.1 K/uL    Comment: Performed at [facility] [address], [place], [place] [address]  Glucose, capillary     Status: Abnormal   Collection Time: 09/24/17 11:52 AM  Result Value Ref Range  Glucose-Capillary 141 (H) 70 - 99 mg/dL  Glucose, capillary     Status: None   Collection Time: 09/24/17  5:07 PM  Result Value Ref Range   Glucose-Capillary 91 70 - 99 mg/dL  Glucose, capillary     Status: Abnormal   Collection Time: 09/24/17  9:29 PM  Result Value Ref Range   Glucose-Capillary 150 (H) 70 - 99 mg/dL  Glucose, capillary     Status: None   Collection Time: 09/25/17  6:40 AM  Result Value Ref Range   Glucose-Capillary 98 70 - 99 mg/dL  Glucose, capillary     Status: Abnormal   Collection Time: 09/25/17 11:36 AM  Result Value Ref Range   Glucose-Capillary 69 (L) 70 - 99 mg/dL   Comment 1 Notify RN   Glucose, capillary     Status: None   Collection Time: 09/25/17 11:57 AM  Result Value Ref Range   Glucose-Capillary 78 70 - 99 mg/dL   Comment 1 Notify RN   Glucose, capillary     Status: Abnormal   Collection Time: 09/25/17  5:09 PM  Result Value Ref Range   Glucose-Capillary 209 (H) 70 - 99 mg/dL   Comment 1 Notify RN   Glucose, capillary     Status: None   Collection Time: 09/25/17  9:29 PM  Result Value Ref Range    Glucose-Capillary 74 70 - 99 mg/dL  Glucose, capillary     Status: None   Collection Time: 09/26/17  6:38 AM  Result Value Ref Range   Glucose-Capillary 83 70 - 99 mg/dL  Glucose, capillary     Status: None   Collection Time: 09/26/17  7:52 AM  Result Value Ref Range   Glucose-Capillary 86 70 - 99 mg/dL    Physical exam: Constitutional: No distress . Vital signs reviewed. HENT: Normocephalic.  Atraumatic. Eyes: EOMI. No discharge. Cardiovascular: +Tachycardic. Regular rhythm.. No JVD. Respiratory: CTA Bilaterally. Normal effort. GI: BS +. Non-distended. Musc: No edema or tenderness in extremities. Neuro: Alert and oriented x1. Aphasia, ? Improving Motor: Right upper extremity: 1/5 shoulder abduction, distally 0/5 with tone at wrist (unchanged) Right lower extremity: Hip flexion 2+/5, ?distally 0/5 tone at ankle (unchanged) Skin:   Crani site healed  Assessment/Plan: 1. 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 Physiatrist is providing close team supervision and 24 hour management of active medical problems listed below. Physiatrist and rehab team continue to assess barriers to discharge/monitor patient progress toward functional and medical goals. FIM: Function - Bathing Position: Wheelchair/chair at sink Body parts bathed by patient: Abdomen, Chest, Front perineal area, Right upper leg, Left upper leg, Left lower leg, Buttocks, Right arm Body parts bathed by helper: Left arm, Right arm, Right lower leg, Back Assist Level: Touching or steadying assistance(Pt > 75%)  Function- Upper Body Dressing/Undressing What is the patient wearing?: Pull over shirt/dress Pull over shirt/dress - Perfomed by patient: Thread/unthread left sleeve, Put head through opening, Pull shirt over trunk Pull over shirt/dress - Perfomed by helper:  Thread/unthread right sleeve Assist Level: Touching or steadying assistance(Pt > 75%) Function - Lower Body Dressing/Undressing What is the patient wearing?: Pants, Non-skid slipper socks Position: Wheelchair/chair at sink Pants- Performed by patient: Thread/unthread left pants leg, Pull pants up/down Pants- Performed by helper: Thread/unthread right pants leg Non-skid slipper socks- Performed by patient: Don/doff left sock Non-skid slipper socks- Performed by helper: Don/doff  right sock Socks - Performed by helper: Don/doff left sock, Don/doff right sock Shoes - Performed by helper: Don/doff left shoe, Don/doff right shoe, Fasten left, Fasten right Assist for footwear: Partial/moderate assist Assist for lower body dressing: Touching or steadying assistance (Pt > 75%)  Function - Toileting Toileting activity did not occur: No continent bowel/bladder event Toileting steps completed by patient: Adjust clothing prior to toileting, Adjust clothing after toileting Toileting steps completed by helper: Performs perineal hygiene, Adjust clothing after toileting Assist level: Touching or steadying assistance (Pt.75%)  Function - Toilet Transfers Toilet transfer activity did not occur: Safety/medical concerns Toilet transfer assistive device: Elevated toilet seat/BSC over toilet Mechanical lift: Stedy Assist level to toilet: Touching or steadying assistance (Pt > 75%) Assist level from toilet: Moderate assist (Pt 50 - 74%/lift or lower)  Function - Chair/bed transfer Chair/bed transfer method: Stand pivot Chair/bed transfer assist level: Moderate assist (Pt 50 - 74%/lift or lower) Chair/bed transfer assistive device: Armrests Mechanical lift: Stedy Chair/bed transfer details: Manual facilitation for weight shifting, Manual facilitation for placement, Verbal cues for technique, Verbal cues for precautions/safety  Function - Locomotion: Wheelchair Will patient use wheelchair at discharge?:  Yes Type: Manual Max wheelchair distance: >150' Assist Level: Touching or steadying assistance (Pt > 75%) Assist Level: Touching or steadying assistance (Pt > 75%) Assist Level: Touching or steadying assistance (Pt > 75%) Turns around,maneuvers to table,bed, and toilet,negotiates 3% grade,maneuvers on rugs and over doorsills: No Function - Locomotion: Ambulation Ambulation activity did not occur: Safety/medical concerns Assistive device: Walker-rolling, Orthosis Max distance: 15 ft Assist level: Moderate assist (Pt 50 - 74%) Assist level: Moderate assist (Pt 50 - 74%)  Function - Comprehension Comprehension: Auditory Comprehension assist level: Understands basic 75 - 89% of the time/ requires cueing 10 - 24% of the time  Function - Expression Expression: Verbal Expression assist level: Expresses basic 75 - 89% of the time/requires cueing 10 - 24% of the time. Needs helper to occlude trach/needs to repeat words.  Function - Social Interaction Social Interaction assist level: Interacts appropriately 75 - 89% of the time - Needs redirection for appropriate language or to initiate interaction.  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 DVT Prophylaxis/Anticoagulation: Subcutaneous heparin. LE vascular study negative 3. Pain Management:Tylenol as needed 4. Mood:Provide emotional  support  Added scheduled Seroquel at night-reduced dose secondary to sedation   Added as needed Seroquel for agitation.  Want to avoid oversedation 5. Neuropsych: This patientis notcapable of making decisions on herown behalf. 6. Skin/Wound Care:Routine skin checks 7. Fluids/Electrolytes/Nutrition:Routine in and outs  PEG tube placed, tolerating bolus feeds 8.Dysphagia. Dysphagia #1 nectar liquids s/p PEG 9.Hyperglycemia induced from tube feeds. Continue Lantus insulin for coverage. CBG (last 3)  Recent Labs    09/25/17 2129 09/26/17 0638 09/26/17 0752  GLUCAP 74 83 86   Labile on 6/27 10.Hypertension. Lopressor  Vitals:   09/25/17 1946 09/26/17 0508  BP: 133/81 130/79  Pulse: (!) 103 (!) 130  Resp:    Temp: 98.2 F (36.8 C) 98.2 F (36.8 C)  SpO2: 100% 100%   Relatively controlled on 6/27 11.  Anemia: Baseline Hgb ?11.  Hemoglobin 8.8 on 6/25  Labs ordered  Continue to monitor 12.  Agitation due to confusion, balancing control of agitation with sedation  13. Sinus tachy- likely multifactorial anemia, not hypoxic Lopressor was increased to 100 mg twice a day on 09/15/2017  Labile at present 14. Hypernatremia  Sodium 147 on 6/25  Labs ordered  Continue to monitor, will adjust free water flushes if necessary   LOS (Days) 23 A FACE TO FACE EVALUATION WAS PERFORMED  Lucan Riner Lorie Phenix 09/26/2017, 9:41 AM

## 2017-09-26 NOTE — Plan of Care (Signed)
  Problem: RH BOWEL ELIMINATION Goal: RH STG MANAGE BOWEL WITH ASSISTANCE Description STG Manage Bowel with Assistance. Mod  Outcome: Progressing   Problem: RH BLADDER ELIMINATION Goal: RH STG MANAGE BLADDER WITH ASSISTANCE Description STG Manage Bladder With Assistance. Mod  Outcome: Progressing Goal: RH STG MANAGE BLADDER WITH EQUIPMENT WITH ASSISTANCE Description STG Manage Bladder With Equipment With Assistance. MOD  Outcome: Progressing   Problem: RH COGNITION-NURSING Goal: RH STG USES MEMORY AIDS/STRATEGIES W/ASSIST TO PROBLEM SOLVE Description STG Uses Memory Aids/Strategies With mod  Assistance to Problem Solve. Cues and supervision   Outcome: Progressing   Problem: Consults Goal: RH STROKE PATIENT EDUCATION Description See Patient Education module for education specifics  Outcome: Progressing

## 2017-09-27 LAB — GLUCOSE, CAPILLARY: Glucose-Capillary: 83 mg/dL (ref 70–99)

## 2017-09-27 NOTE — Progress Notes (Signed)
Pt d/c home. Pt is stable no new concerns. Pt is transported out of hospital by family

## 2017-09-27 NOTE — Progress Notes (Signed)
Speech Language Pathology Discharge Summary  Patient Details  Name: Carla Bauer MRN: [id number] Date of Birth: Sep 30, 1955    Patient has met 7 of 7 long term goals.  Patient to discharge at overall Mod level.  Reasons goals not met:     Clinical Impression/Discharge Summary:  Pt has made slow but functional gains while inpatient and is discharging having met 7 out of 7 long term goals although some goals had to be downgraded due to slow progress.  Pt is currently consuming dys 1 textures and nectar thick liquids with mod cues for use of swallowing precautions.  Pt is also mod assist for functional communication due to decreased auditory comprehension for following multi step commands and decreased ability to convey her needs and wants to caregivers due to fluent language of confusion.  Pt continues to have severe cognitive deficits but has demonstrated improved participation in therapies with slightly decreased impulsive and restless behaviors.  Pt was still requiring a telesitter at the time of discharge from hospital.  Pt and family education was completed at this venue of care; however, recommend ongoing education with follow up ST services given limited family participation in therapies.   Care Partner:  Caregiver Able to Provide Assistance: Yes  Type of Caregiver Assistance: Physical;Cognitive  Recommendation:  Home Health SLP;Outpatient SLP;24 hour supervision/assistance  Rationale for SLP Follow Up: Maximize functional communication;Maximize cognitive function and independence;Reduce caregiver burden   Equipment: thickener    Reasons for discharge: Discharged from hospital   Patient/Family Agrees with Progress Made and Goals Achieved: Yes   Numan Zylstra, Selinda Orion 09/27/2017, 3:52 PM

## 2017-09-27 NOTE — Plan of Care (Signed)
  Problem: RH BLADDER ELIMINATION Goal: RH STG MANAGE BLADDER WITH ASSISTANCE Description STG Manage Bladder With Assistance. Mod  Outcome: Not Met (add Reason)   Problem: RH COGNITION-NURSING Goal: RH STG USES MEMORY AIDS/STRATEGIES W/ASSIST TO PROBLEM SOLVE Description STG Uses Memory Aids/Strategies With mod  Assistance to Problem Solve. Cues and supervision   Outcome: Not Met (add Reason)   Problem: RH BOWEL ELIMINATION Goal: RH STG MANAGE BOWEL WITH ASSISTANCE Description STG Manage Bowel with Assistance. Mod  Outcome: Completed/Met   Problem: RH BLADDER ELIMINATION Goal: RH STG MANAGE BLADDER WITH EQUIPMENT WITH ASSISTANCE Description STG Manage Bladder With Equipment With Assistance. MOD  Outcome: Completed/Met   Problem: Consults Goal: RH STROKE PATIENT EDUCATION Description See Patient Education module for education specifics  Outcome: Completed/Met    Patient still has incontinent episodes with bladder requiring total assist for changing clothing and brief.  Patient continues to struggle with retaining any information provided.  Education primarily focused toward family.  Brita Romp, RN

## 2017-09-27 NOTE — Discharge Instructions (Signed)
Inpatient Rehab Discharge Instructions  Carla RightDorothy A Bauer Discharge date and time: No discharge date for patient encounter.   Activities/Precautions/ Functional Status: Activity: activity as tolerated Diet: Dysphagia 1 nectar liquids/Osmolite tube feeds 240 mL 4 times daily/free water 200 mL 5 times daily Wound Care: none needed Functional status:  ___ No restrictions     ___ Walk up steps independently ___ 24/7 supervision/assistance   ___ Walk up steps with assistance ___ Intermittent supervision/assistance  ___ Bathe/dress independently ___ Walk with walker     _x__ Bathe/dress with assistance ___ Walk Independently    ___ Shower independently ___ Walk with assistance    ___ Shower with assistance ___ No alcohol     ___ Return to work/school ________  Special Instructions: Cleanse PEG tube site daily warm soap and water    COMMUNITY REFERRALS UPON DISCHARGE:    Home Health:   PT, OT ,RN ,SP, SW  Agency:[facility] Phone:[phone]   Date of last service:09/27/2017  Medical Equipment/Items Ordered:WHEELCHAIR, YOUTH ROLLING HullWALKER, TUB BENCH AND TUBE FEEDING  Agency/Supplier:[facility]   [phone] Other: MEDICAID AND DISABILITY [facility] FOR [facility]  GENERAL COMMUNITY RESOURCES FOR PATIENT/FAMILY: Support Groups:[facility] SUPPORT GROUP EVERY SECOND Thursday @ 3:00-4:00 PM ON THE REHAB UNIT ( SEPT-MAY) QUESTIONS CONTACT [facility] [phone]  My questions have been answered and I understand these instructions. I will adhere to these goals and the provided educational materials after my discharge from the hospital.  Patient/Caregiver Signature _______________________________ Date __________  Clinician Signature _______________________________________ Date __________  Please bring this form and your medication list with you to all your follow-up doctor's appointments.

## 2017-09-27 NOTE — Progress Notes (Signed)
Social Work  Discharge Note  The overall goal for the admission was met for:   Discharge location: Yes-HOME WITH DAUGHTER'S WHO ARE AWARE SHE REQUIRES 24 HR PHYSICAL CARE  Length of Stay: Yes-24 DAYS  Discharge activity level: Yes-MIN ASSIST WHEELCHAIR LEVEL  Home/community participation: Yes  Services provided included: MD, RD, PT, OT, SLP, RN, CM, TR, Pharmacy, Neuropsych and SW  Financial Services: Other: PENDING MEDICAID  Follow-up services arranged: Home Health: [facility]-PT,OT,SP, RN,SW, AIDE, DME: [facility]-WHEELCHAIR, YOUTH ROLLING WALKER, TUB BENCH AND TUBE FEEDINGS and Patient/Family has no preference for HH/DME agencies  Comments (or additional information):DAUGHTER WERE IN FOR FAMILY EDUCATION AND FEEL WITH THEIR BACKGROUNDS-CNA & MED TECH THEY CAN PROVIDE THE CARE PT REQUIRES. PT WILL BE AT [place] RE-ADMIT DUE TO NON-COMPLIANCE AND DEMANDS DIET NOT SAFE FOR HER. BOTH DAUGHTER'S HAVE BEEN EDUCATED ON THIS. MATCH GIVEN TO DAUGHTER FOR MEDICATION ASSISTANCE. PCP SET UP VIA [facility] [place] WILL ASSIST WITH MEDICATIONS AND [place] CARD UNTIL APPROVED FOR DISABILITY AND MEDICAID-BOTH APPLICATIONS ARE PENDING  Patient/Family verbalized understanding of follow-up arrangements: Yes  Individual responsible for coordination of the follow-up plan: [name] & [name]-DAUGHTER'S  Confirmed correct DME delivered: Elease Hashimoto 09/27/2017    Elease Hashimoto

## 2017-09-27 NOTE — Progress Notes (Signed)
Subjective/Complaints: Patient seen lying in bed this morning. No reported issues overnight.Patient remains confused and not able to consistently follow commands, but tells me that she is going home today at 1 PM.  ROS: limited due to aphasia.  Objective: Vital Signs: Blood pressure 105/75, pulse (!) 115, temperature 97.8 F (36.6 C), temperature source Oral, resp. rate 18, height 5' 1"  (1.549 m), weight 69.5 kg (153 lb 3.5 oz), SpO2 98 %. No results found. Results for orders placed or performed during the hospital encounter of 09/03/17 (from the past 72 hour(s))  Glucose, capillary     Status: None   Collection Time: 09/24/17  5:07 PM  Result Value Ref Range   Glucose-Capillary 91 70 - 99 mg/dL  Glucose, capillary     Status: Abnormal   Collection Time: 09/24/17  9:29 PM  Result Value Ref Range   Glucose-Capillary 150 (H) 70 - 99 mg/dL  Glucose, capillary     Status: None   Collection Time: 09/25/17  6:40 AM  Result Value Ref Range   Glucose-Capillary 98 70 - 99 mg/dL  Glucose, capillary     Status: Abnormal   Collection Time: 09/25/17 11:36 AM  Result Value Ref Range   Glucose-Capillary 69 (L) 70 - 99 mg/dL   Comment 1 Notify RN   Glucose, capillary     Status: None   Collection Time: 09/25/17 11:57 AM  Result Value Ref Range   Glucose-Capillary 78 70 - 99 mg/dL   Comment 1 Notify RN   Glucose, capillary     Status: Abnormal   Collection Time: 09/25/17  5:09 PM  Result Value Ref Range   Glucose-Capillary 209 (H) 70 - 99 mg/dL   Comment 1 Notify RN   Glucose, capillary     Status: None   Collection Time: 09/25/17  9:29 PM  Result Value Ref Range   Glucose-Capillary 74 70 - 99 mg/dL  Glucose, capillary     Status: None   Collection Time: 09/26/17  6:38 AM  Result Value Ref Range   Glucose-Capillary 83 70 - 99 mg/dL  Glucose, capillary     Status: None   Collection Time: 09/26/17  7:52 AM  Result Value Ref Range   Glucose-Capillary 86 70 - 99 mg/dL  CBC     Status:  Abnormal   Collection Time: 09/26/17 10:29 AM  Result Value Ref Range   WBC 9.6 4.0 - 10.5 K/uL   RBC 2.99 (L) 3.87 - 5.11 MIL/uL   Hemoglobin 9.0 (L) 12.0 - 15.0 g/dL   HCT 29.6 (L) 36.0 - 46.0 %   MCV 99.0 78.0 - 100.0 fL   MCH 30.1 26.0 - 34.0 pg   MCHC 30.4 30.0 - 36.0 g/dL   RDW 15.5 11.5 - 15.5 %   Platelets 404 (H) 150 - 400 K/uL    Comment: Performed at [facility], [address], [place], [place] [address]  Basic metabolic panel     Status: Abnormal   Collection Time: 09/26/17 10:29 AM  Result Value Ref Range   Sodium 144 135 - 145 mmol/L   Potassium 3.6 3.5 - 5.1 mmol/L   Chloride 107 98 - 111 mmol/L    Comment: Please note change in reference range.   CO2 28 22 - 32 mmol/L   Glucose, Bld 159 (H) 70 - 99 mg/dL    Comment: Please note change in reference range.   BUN 13 8 - 23 mg/dL    Comment: Please note change  in reference range.   Creatinine, Ser 0.61 0.44 - 1.00 mg/dL   Calcium 9.4 8.9 - 10.3 mg/dL   GFR calc non Af Amer >60 >60 mL/min   GFR calc Af Amer >60 >60 mL/min    Comment: (NOTE) The eGFR has been calculated using the CKD EPI equation. This calculation has not been validated in all clinical situations. eGFR's persistently <60 mL/min signify possible Chronic Kidney Disease.    Anion gap 9 5 - 15    Comment: Performed at [facility] [address], [place], [place] [address]  Glucose, capillary     Status: Abnormal   Collection Time: 09/26/17 11:51 AM  Result Value Ref Range   Glucose-Capillary 132 (H) 70 - 99 mg/dL  Glucose, capillary     Status: None   Collection Time: 09/26/17  5:25 PM  Result Value Ref Range   Glucose-Capillary 70 70 - 99 mg/dL  Glucose, capillary     Status: Abnormal   Collection Time: 09/26/17  9:25 PM  Result Value Ref Range   Glucose-Capillary 203 (H) 70 - 99 mg/dL  Glucose, capillary     Status: None   Collection Time: 09/27/17  6:51 AM  Result Value Ref Range   Glucose-Capillary 83 70 - 99 mg/dL     Physical exam: Constitutional: No distress . Vital signs reviewed. HENT: Normocephalic.  Atraumatic. Eyes: EOMI. No discharge. Cardiovascular: +Tachycardic. Regular rhythm.. No JVD. Respiratory: CTA Bilaterally. Normal effort. GI: BS +. Non-distended. Musc: No edema or tenderness in extremities. Neuro: Alert and oriented x1. Aphasia, ? Somewhat improving Motor: Right upper extremity: 1/5 shoulder abduction, distally 0/5 with tone at wrist (stable) Right lower extremity: Hip flexion 2+/5, ?distally 0/5 tone at ankle (table) Skin:   Crani site healed  Assessment/Plan: 1. 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 Physiatrist is providing close team supervision and 24 hour management of active medical problems listed below. Physiatrist and rehab team continue to assess barriers to discharge/monitor patient progress toward functional and medical goals. FIM: Function - Bathing Position: Shower Body parts bathed by patient: Abdomen, Chest, Front perineal area, Right upper leg, Left upper leg, Left lower leg, Buttocks, Right arm Body parts bathed by helper: Left arm, Right lower leg, Back Assist Level: Touching or steadying assistance(Pt > 75%)  Function- Upper Body Dressing/Undressing What is the patient wearing?: Pull over shirt/dress Pull over shirt/dress - Perfomed by patient: Thread/unthread left sleeve, Put head through opening, Pull shirt over trunk Pull over shirt/dress - Perfomed by helper: Thread/unthread right sleeve Assist Level: Supervision or verbal cues, Set up Function - Lower Body Dressing/Undressing What is the patient wearing?: Pants, Socks, Shoes, AFO Position: Wheelchair/chair at sink Pants- Performed by patient: Thread/unthread left pants leg, Pull pants up/down Pants- Performed by helper: Thread/unthread right pants  leg Non-skid slipper socks- Performed by patient: Don/doff left sock Non-skid slipper socks- Performed by helper: Don/doff right sock Socks - Performed by helper: Don/doff left sock, Don/doff right sock Shoes - Performed by helper: Don/doff left shoe, Don/doff right shoe, Fasten left, Fasten right AFO - Performed by helper: Don/doff right AFO Assist for footwear: Partial/moderate assist Assist for lower body dressing: Touching or steadying assistance (Pt > 75%)  Function - Toileting Toileting activity did not occur: No continent bowel/bladder event Toileting steps completed by patient: Adjust clothing prior to toileting, Adjust clothing after toileting Toileting steps  completed by helper: Performs perineal hygiene, Adjust clothing after toileting Assist level: Touching or steadying assistance (Pt.75%)  Function - Toilet Transfers Toilet transfer activity did not occur: Safety/medical concerns Toilet transfer assistive device: Elevated toilet seat/BSC over toilet Mechanical lift: Stedy Assist level to toilet: Touching or steadying assistance (Pt > 75%) Assist level from toilet: Moderate assist (Pt 50 - 74%/lift or lower)  Function - Chair/bed transfer Chair/bed transfer activity did not occur: Refused Chair/bed transfer method: Stand pivot Chair/bed transfer assist level: Moderate assist (Pt 50 - 74%/lift or lower) Chair/bed transfer assistive device: Armrests Mechanical lift: Stedy Chair/bed transfer details: Manual facilitation for weight shifting, Manual facilitation for placement, Verbal cues for technique, Verbal cues for precautions/safety  Function - Locomotion: Wheelchair Will patient use wheelchair at discharge?: Yes Type: Manual Max wheelchair distance: 15' Assist Level: Supervision or verbal cues Assist Level: Supervision or verbal cues Wheel 150 feet activity did not occur: Refused Assist Level: Touching or steadying assistance (Pt > 75%) Turns around,maneuvers to  table,bed, and toilet,negotiates 3% grade,maneuvers on rugs and over doorsills: No Function - Locomotion: Ambulation Ambulation activity did not occur: Refused Assistive device: Walker-rolling, Orthosis Max distance: 15 ft Assist level: Moderate assist (Pt 50 - 74%) Walk 10 feet activity did not occur: Refused Assist level: Moderate assist (Pt 50 - 74%) Walk 50 feet with 2 turns activity did not occur: Refused Walk 150 feet activity did not occur: Refused Walk 10 feet on uneven surfaces activity did not occur: Refused  Function - Comprehension Comprehension: Auditory Comprehension assist level: Understands basic 50 - 74% of the time/ requires cueing 25 - 49% of the time  Function - Expression Expression: Verbal Expression assist level: Expresses basic 25 - 49% of the time/requires cueing 50 - 75% of the time. Uses single words/gestures.  Function - Social Interaction Social Interaction assist level: Interacts appropriately 25 - 49% of time - Needs frequent redirection.  Function - Problem Solving Problem solving assist level: Solves basic 25 - 49% of the time - needs direction more than half the time to initiate, plan or complete simple activities  Function - Memory Memory assist level: Recognizes or recalls less than 25% of the time/requires cueing greater than 75% of the time Patient normally able to recall (first 3 days only): None of the above   Medical Problem List and Plan: 1.Decreased functional mobility with right-sided weaknesssecondary to intraparenchymal hematoma in the medial left temporal lobe status post left craniotomy clipping of aneurysm placement of right frontal external ventricular drain 08/14/2017, severe cognitive deficits  D/c today  Patient to follow up with Dr. Dianna Limbo for transitional care management in 1-2 weeks  WHO/PRAFO 2. DVT Prophylaxis/Anticoagulation: Subcutaneous heparin. LE vascular study negative 3. Pain Management:Tylenol as  needed 4. Mood:Provide emotional support  Added scheduled Seroquel at night-reduced dose secondary to sedation   Added as needed Seroquel for agitation.  Want to avoid oversedation 5. Neuropsych: This patientis notcapable of making decisions on herown behalf. 6. Skin/Wound Care:Routine skin checks 7. Fluids/Electrolytes/Nutrition:Routine in and outs  PEG tube placed, tolerating bolus feeds 8.Dysphagia. Dysphagia #1 nectar liquids s/p PEG 9.Hyperglycemia induced from tube feeds. Continue Lantus insulin for coverage. CBG (last 3)  Recent Labs    09/26/17 1725 09/26/17 2125 09/27/17 0651  GLUCAP 70 203* 83   Labile on 6/28, will need a motor monitor 10.Hypertension. Lopressor  Vitals:   09/26/17 2038 09/27/17 0455  BP: 134/79 105/75  Pulse: (!) 117 (!) 115  Resp: 20 18  Temp: 98.1  F (36.7 C) 97.8 F (36.6 C)  SpO2: 100% 98%   Relatively controlled on 6/28 11.  Anemia: Baseline Hgb ?11.  Hemoglobin 9.0 on 6/27  Continue to monitor 12.  Agitation due to confusion, balancing control of agitation with sedation  13. Sinus tachy- likely multifactorial anemia, not hypoxic Lopressor was increased to 100 mg twice a day on 09/15/2017  Labile/?trending up, will need ambulatory monitoring 14. Hypernatremia; Resolved  Sodium 144 on 6/27   LOS (Days) 24 A FACE TO FACE EVALUATION WAS PERFORMED  Delorice Bannister Lorie Phenix 09/27/2017, 1:06 PM

## 2017-10-01 ENCOUNTER — Telehealth: Payer: Self-pay

## 2017-10-01 NOTE — Telephone Encounter (Signed)
Transitional Care call attempt made today 10-01-17.  Called all avaliable numbers and 2 of them had full voicemail boxes where the others were straight to voicemail.  Patient name: Carla Bauer([name]) DOB: ([birth year]) Appointment date/time (10-07-2017/1115am), arrive time (1045am) and who it is with here (Dr. Wynn BankerKirsteins)

## 2017-10-02 ENCOUNTER — Telehealth: Payer: Self-pay | Admitting: *Deleted

## 2017-10-02 MED ORDER — METOPROLOL TARTRATE 50 MG PO TABS: ORAL_TABLET | ORAL | 0 refills | Status: DC

## 2017-10-02 NOTE — Telephone Encounter (Signed)
Allen DerryAllison Mallory, ST, [facility] left a message asking for verbal orders  1week2. Chart reviewed, Verbal orders given per office protocol. Also, important, Speech therapist reports that the family has modified the patient's diet back to normal textures and thin liquids. The speech therapist made it clear when I spoke to her that she did not modify the diet. She educated the family on the purpose of a modified diet and listened to the patients lungs for any signs of aspiration. She reports no signs or symptoms for pneumonia and no overt signs of aspiration. Please advise and/or FYI

## 2017-10-02 NOTE — Telephone Encounter (Signed)
[facility] Provider [facility], [facility] will prescribe the Metoprolol to be given by Peg tube. [facility] and she would have to obtain medication at a compound pharmacy. [facility]. The daughter states the speech pathologist was there this morning and stating Ms. Carla Bauer can have PO intake, [facility] was called and awaiting a call from Speech Pathologist, this was relayed to daughter. She verbalizes understanding.

## 2017-10-02 NOTE — Telephone Encounter (Signed)
I spoke with Revonda Standardllison again and she wanted to be sure we understood she did not alter the diet instructions.  She is not sure of how well the patient understands because they reported to her that  "thae man told her if she could get to eating they could remove the tube" so they understood she could try and eat.  She has been doing ok with observation and no signs of aspiration.  Revonda Standardllison said she was chewing her pills at baseline and that is where she is now. She showed her how to take with some applesauce.

## 2017-10-02 NOTE — Telephone Encounter (Signed)
[facility]  Start of Care nurse went out yesterday and ST was due to go today.

## 2017-10-02 NOTE — Telephone Encounter (Signed)
Transitional Care call Transitional Care Call Questions Answered by daughter Marcelle Smilingatasha  Patient name: Carla BoroughsDorothy Bauer  DOB: 11/24/ 1957 1. Are you/is patient experiencing any problems since coming home? Daughter states her mother's blood pressure is running in the 152/99, she doesn't have any anti-hypertensive  Medication. PA will be callled.  a. Are there any questions regarding any aspect of care? See above.  2. Are there any questions regarding medications administration/dosing? See above  a. Are meds being taken as prescribed? Yes, except antihypertensive medication.  b. "Patient should review meds with caller to confirm" Medication list reviewed. 3. Have there been any falls? No 4. Has Home Health been to the house and/or have they contacted you? [facility] a. If not, have you tried to contact them? NA b. Can we help you contact them? NA 5. Are bowels and bladder emptying properly? Yes a. Are there any unexpected incontinence issues? No b. If applicable, is patient following bowel/bladder programs? NA 6. Any fevers, problems with breathing, unexpected pain? No 7. Are there any skin problems or new areas of breakdown? No 8. Has the patient/family member arranged specialty MD follow up (ie cardiology/neurology/renal/surgical/etc.)?  Yes, daughter is awaitng a return call.  a. Can we help arrange? NA 9. Does the patient need any other services or support that we can help arrange? No 10. Are caregivers following through as expected in assisting the patient? Yes 11. Has the patient quit smoking, drinking alcohol, or using drugs as recommended? Daughter states no smoking, drinking alcohol or using illicit drugs.  Appointment date/time 10/07/2017 arrival time at 10:45 fpr 11:15 appointment. At [address]

## 2017-10-03 NOTE — Telephone Encounter (Signed)
The D/C diet is D1 Nectar, may order MBS for next week to see if we can upgrade.

## 2017-10-04 ENCOUNTER — Encounter (HOSPITAL_COMMUNITY): Payer: Self-pay | Admitting: Interventional Radiology

## 2017-10-07 ENCOUNTER — Other Ambulatory Visit: Payer: Self-pay

## 2017-10-07 ENCOUNTER — Encounter: Payer: Medicaid Other | Attending: Physical Medicine & Rehabilitation

## 2017-10-07 ENCOUNTER — Encounter: Payer: Self-pay | Admitting: Physical Medicine & Rehabilitation

## 2017-10-07 ENCOUNTER — Ambulatory Visit (HOSPITAL_BASED_OUTPATIENT_CLINIC_OR_DEPARTMENT_OTHER): Payer: Self-pay | Admitting: Physical Medicine & Rehabilitation

## 2017-10-07 VITALS — BP 129/86 | HR 79

## 2017-10-07 DIAGNOSIS — S06353S Traumatic hemorrhage of left cerebrum with loss of consciousness of 1 hours to 5 hours 59 minutes, sequela: Secondary | ICD-10-CM | POA: Diagnosis not present

## 2017-10-07 DIAGNOSIS — R4702 Dysphasia: Secondary | ICD-10-CM | POA: Diagnosis not present

## 2017-10-07 DIAGNOSIS — R7303 Prediabetes: Secondary | ICD-10-CM | POA: Diagnosis not present

## 2017-10-07 DIAGNOSIS — R7309 Other abnormal glucose: Secondary | ICD-10-CM

## 2017-10-07 DIAGNOSIS — R4701 Aphasia: Secondary | ICD-10-CM | POA: Insufficient documentation

## 2017-10-07 DIAGNOSIS — I69391 Dysphagia following cerebral infarction: Secondary | ICD-10-CM

## 2017-10-07 DIAGNOSIS — X58XXXS Exposure to other specified factors, sequela: Secondary | ICD-10-CM | POA: Insufficient documentation

## 2017-10-07 DIAGNOSIS — I610 Nontraumatic intracerebral hemorrhage in hemisphere, subcortical: Secondary | ICD-10-CM

## 2017-10-07 NOTE — Telephone Encounter (Signed)
Left vm with Carla Bauer

## 2017-10-07 NOTE — Progress Notes (Signed)
Subjective:  Transitional care visit, transitional care call completed  Patient ID: Carla Bauer, female    DOB: December 23, 1955, 62 y.o.   MRN: [id number] 62 year old right-handed female with a history of headaches.  Lives with her daughter.  Independent prior to admission.  Presented 08/14/2017 after being found down by family with right-sided weakness.  Cranial CT scan showed left  temporal hemorrhage 4.2 x 3.4 x 2.1 intraparenchymal hematoma in the medial left temporal lobe with intraventricular penetration filling of the left lateral ventricle with blood.  Mass effect with right to left shift.  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  Underwent craniotomy, clipping of aneurysm, evacuation of hematoma.  Placement of right frontal external ventricular drain 08/14/2017 by Dr. Conchita ParisNundkumar.   Multiple attempts to wean her ventricular catheter unsuccessfully.  Underwent placement of laparoscopic assisted right ventriculoperitoneal shunt 08/29/2017.    HPI  Discharge from inpatient stroke rehabilitation unit on 09/27/2017.  She is returned home with the assistance of 2 daughters.  One daughter is a Scientist, clinical (histocompatibility and immunogenetics)med tech and administers her insulin.  They have the daughter is here today, she provides G-tube care. Eating greater than 50% of meals.  Appetite improved greatly after returning home according to family.  Patient reportedly did not like hospital food but was not able to verbalize this during her hospitalization. Patient remains aphasic although she is now able to sentence level communication. No reported falls at home.  She is starting to move her right upper extremity more. Home health PT, will be starting this week  Pain Inventory Average Pain 0 Pain Right Now 0 My pain is no pain  In the last 24 hours, has pain interfered with the following? General activity 0 Relation with others 0 Enjoyment of life  0 What TIME of day is your pain at its worst? no pain Sleep (in general) Good  Pain is worse with: no pain Pain improves with: no pain Relief from Meds: no pain  Mobility walk with assistance use a walker ability to climb steps?  no do you drive?  no use a wheelchair needs help with transfers  Function disabled: date disabled n/a  Neuro/Psych weakness trouble walking  Prior Studies Any changes since last visit?  no  Physicians involved in your care Any changes since last visit?  no   No family history on file. Social History   Socioeconomic History  . Marital status: Single    Spouse name: Not on file  . Number of children: Not on file  . Years of education: Not on file  . Highest education level: Not on file  Occupational History  . Not on file  Social Needs  . Financial resource strain: Not on file  . Food insecurity:    Worry: Not on file    Inability: Not on file  . Transportation needs:    Medical: Not on file    Non-medical: Not on file  Tobacco Use  . Smoking status: Never Smoker  . Smokeless tobacco: Never Used  Substance and Sexual Activity  . Alcohol use: No  . Drug use: No  . Sexual activity: Not on file  Lifestyle  . Physical activity:    Days per week: Not on file    Minutes per session: Not on file  . Stress: Not on file  Relationships  . Social connections:    Talks on phone: Not on file    Gets together:  Not on file    Attends religious service: Not on file    Active member of club or organization: Not on file    Attends meetings of clubs or organizations: Not on file    Relationship status: Not on file  Other Topics Concern  . Not on file  Social History Narrative  . Not on file   Past Surgical History:  Procedure Laterality Date  . CESAREAN SECTION    . CRANIOTOMY Left 08/14/2017   Procedure: 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;  Surgeon: Lisbeth Renshaw, MD;  Location: [facility] OR;  Service:  Neurosurgery;  Laterality: Left;  . IR GASTROSTOMY TUBE MOD SED  09/16/2017  . LAPAROSCOPIC REVISION VENTRICULAR-PERITONEAL (V-P) SHUNT N/A 08/29/2017   Procedure: LAPAROSCOPIC REVISION VENTRICULAR-PERITONEAL (V-P) SHUNT;  Surgeon: Abigail Miyamoto, MD;  Location: [facility] OR;  Service: General;  Laterality: N/A;  LAPAROSCOPIC REVISION VENTRICULAR-PERITONEAL (V-P) SHUNT  . VENTRICULOPERITONEAL SHUNT Right 08/29/2017   Procedure: SHUNT INSERTION VENTRICULAR-PERITONEAL;  Surgeon: Lisbeth Renshaw, MD;  Location: [facility] OR;  Service: Neurosurgery;  Laterality: Right;  SHUNT INSERTION VENTRICULAR-PERITONEAL   No past medical history on file. BP 129/86   Pulse 79   SpO2 98%   Opioid Risk Score:   Fall Risk Score:  `1  Depression screen PHQ 2/9  Depression screen PHQ 2/9 10/07/2017  Decreased Interest 0  Down, Depressed, Hopeless 0  PHQ - 2 Score 0     Review of Systems  Constitutional: Negative.   HENT: Negative.   Eyes: Negative.   Respiratory: Negative.   Cardiovascular: Negative.   Gastrointestinal: Negative.   Endocrine: Negative.   Genitourinary: Negative.   Musculoskeletal: Negative.   Skin: Negative.   Allergic/Immunologic: Negative.   Neurological: Negative.   Hematological: Negative.   Psychiatric/Behavioral: Negative.   All other systems reviewed and are negative.      Objective:   Physical Exam  Constitutional: She appears well-developed and well-nourished. No distress.  HENT:  Head: Normocephalic and atraumatic.  Eyes: Pupils are equal, round, and reactive to light. EOM are normal.  Neck: Normal range of motion.  Cardiovascular: Normal rate, regular rhythm and normal heart sounds.  No murmur heard. Pulmonary/Chest: Effort normal and breath sounds normal. No respiratory distress.  Abdominal: Soft. Bowel sounds are normal. She exhibits no distension. There is no tenderness.  G-tube site clean dry and intact  Skin: She is not diaphoretic.  Motor strength is 0/5 in the  right deltoid, bicep, tricep, grip 3- in the right hip flexor knee extensor 0 at the ankle dorsiflexor and plantar flexor 5/5 in left deltoid bicep tricep grip hip flexion extensor ankle dorsiflexion Sensation is reduced reduced but response to pinch on the right side Patient is nonambulatory Speech has a mild dysarthria.  She has some word finding deficits.  She is repetitive in terms of her verbal utterances.      Assessment & Plan:  #1.  Right hemiparesis affecting the upper limb greater than lower limb along with  aphasia and dysphagia.  Motor deficits in the lower extremity have improved towards the upper extremity motor deficits have not improved. Verbal output is also improving. Cont HHPT, OT, SLP PMR f/u in 6wk 2.  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  I think her intake has improved to the point where she no longer requires the tube however it has only been in for about 3 weeks.  Will need to have this removed in IR not before 7/29, will place  order 3.  Hx "prediabetes" but with elevated CBG in hospital in part related to TF.  Now essentially off TF, pt will f/u with new PCP in am and can discuss.

## 2017-10-07 NOTE — Patient Instructions (Addendum)
Stop flomax (tamsulosin)  May restart if patient stops peeing  Stop seroquel, may restart if not sleeping

## 2017-10-08 ENCOUNTER — Encounter: Payer: Self-pay | Admitting: Family Medicine

## 2017-10-08 ENCOUNTER — Ambulatory Visit (INDEPENDENT_AMBULATORY_CARE_PROVIDER_SITE_OTHER): Payer: Self-pay | Admitting: Family Medicine

## 2017-10-08 VITALS — BP 134/84 | HR 77 | Temp 98.0°F | Ht 61.0 in

## 2017-10-08 DIAGNOSIS — N390 Urinary tract infection, site not specified: Secondary | ICD-10-CM

## 2017-10-08 DIAGNOSIS — Z09 Encounter for follow-up examination after completed treatment for conditions other than malignant neoplasm: Secondary | ICD-10-CM

## 2017-10-08 DIAGNOSIS — R319 Hematuria, unspecified: Secondary | ICD-10-CM

## 2017-10-08 DIAGNOSIS — R8290 Unspecified abnormal findings in urine: Secondary | ICD-10-CM

## 2017-10-08 DIAGNOSIS — R7303 Prediabetes: Secondary | ICD-10-CM

## 2017-10-08 DIAGNOSIS — Z8673 Personal history of transient ischemic attack (TIA), and cerebral infarction without residual deficits: Secondary | ICD-10-CM

## 2017-10-08 LAB — POCT URINALYSIS DIP (MANUAL ENTRY)
Bilirubin, UA: NEGATIVE
Glucose, UA: NEGATIVE mg/dL
Ketones, POC UA: NEGATIVE mg/dL
Nitrite, UA: NEGATIVE
Protein Ur, POC: NEGATIVE mg/dL
Spec Grav, UA: 1.015 (ref 1.010–1.025)
Urobilinogen, UA: 0.2 E.U./dL
pH, UA: 6 (ref 5.0–8.0)

## 2017-10-08 LAB — POCT GLYCOSYLATED HEMOGLOBIN (HGB A1C): Hemoglobin A1C: 5.3 % (ref 4.0–5.6)

## 2017-10-08 MED ORDER — SULFAMETHOXAZOLE-TRIMETHOPRIM 800-160 MG PO TABS: 1.0000 | ORAL_TABLET | Freq: Two times a day (BID) | ORAL | 0 refills | Status: DC

## 2017-10-08 MED ORDER — BLOOD GLUCOSE MONITOR KIT
PACK | 0 refills | Status: DC
Start: 2017-10-08 — End: 2018-08-09

## 2017-10-08 NOTE — Patient Instructions (Signed)
6Sulfamethoxazole; Trimethoprim, SMX-TMP tablets What is this medicine? SULFAMETHOXAZOLE; TRIMETHOPRIM or SMX-TMP (suhl fuh meth OK suh zohl; trye METH oh prim) is a combination of a sulfonamide antibiotic and a second antibiotic, trimethoprim. It is used to treat or prevent certain kinds of bacterial infections. It will not work for colds, flu, or other viral infections. This medicine may be used for other purposes; ask your health care provider or pharmacist if you have questions. COMMON BRAND NAME(S): Bacter-Aid DS, Bactrim, Bactrim DS, Septra, Septra DS What should I tell my health care provider before I take this medicine? They need to know if you have any of these conditions: -anemia -asthma -being treated with anticonvulsants -if you frequently drink alcohol containing drinks -kidney disease -liver disease -low level of folic acid or [id number]-1-[id number]-6-phosphate dehydrogenase -poor nutrition or malabsorption -porphyria -severe allergies -thyroid disorder -an unusual or allergic reaction to sulfamethoxazole, trimethoprim, sulfa drugs, other medicines, foods, dyes, or preservatives -pregnant or trying to get pregnant -breast-feeding How should I use this medicine? Take this medicine by mouth with a full glass of water. Follow the directions on the prescription label. Take your medicine at regular intervals. Do not take it more often than directed. Do not skip doses or stop your medicine early. Talk to your pediatrician regarding the use of this medicine in children. Special care may be needed. This medicine has been used in children as young as 752 months of age. Overdosage: If you think you have taken too much of this medicine contact a poison control center or emergency room at once. NOTE: This medicine is only for you. Do not share this medicine with others. What if I miss a dose? If you miss a dose, take it as soon as you can. If it is almost time for your next dose, take only that dose. Do  not take double or extra doses. What may interact with this medicine? Do not take this medicine with any of the following medications: -aminobenzoate potassium -dofetilide -metronidazole This medicine may also interact with the following medications: -ACE inhibitors like benazepril, enalapril, lisinopril, and ramipril -birth control pills -cyclosporine -digoxin -diuretics -indomethacin -medicines for diabetes -methenamine -methotrexate -phenytoin -potassium supplements -pyrimethamine -sulfinpyrazone -tricyclic antidepressants -warfarin This list may not describe all possible interactions. Give your health care provider a list of all the medicines, herbs, non-prescription drugs, or dietary supplements you use. Also tell them if you smoke, drink alcohol, or use illegal drugs. Some items may interact with your medicine. What should I watch for while using this medicine? Tell your doctor or health care professional if your symptoms do not improve. Drink several glasses of water a day to reduce the risk of kidney problems. Do not treat diarrhea with over the counter products. Contact your doctor if you have diarrhea that lasts more than 2 days or if it is severe and watery. This medicine can make you more sensitive to the sun. Keep out of the sun. If you cannot avoid being in the sun, wear protective clothing and use a sunscreen. Do not use sun lamps or tanning beds/booths. What side effects may I notice from receiving this medicine? Side effects that you should report to your doctor or health care professional as soon as possible: -allergic reactions like skin rash or hives, swelling of the face, lips, or tongue -breathing problems -fever or chills, sore throat -irregular heartbeat, chest pain -joint or muscle pain -pain or difficulty passing urine -red pinpoint spots on skin -redness, blistering, peeling or loosening of  the skin, including inside the mouth -unusual bleeding or  bruising -unusually weak or tired -yellowing of the eyes or skin Side effects that usually do not require medical attention (report to your doctor or health care professional if they continue or are bothersome): -diarrhea -dizziness -headache -loss of appetite -nausea, vomiting -nervousness This list may not describe all possible side effects. Call your doctor for medical advice about side effects. You may report side effects to FDA at 1-800-FDA-1088. Where should I keep my medicine? Keep out of the reach of children. Store at room temperature between 20 to 25 degrees C (68 to 77 degrees F). Protect from light. Throw away any unused medicine after the expiration date. NOTE: This sheet is a summary. It may not cover all possible information. If you have questions about this medicine, talk to your doctor, pharmacist, or health care provider.  2018 Elsevier/Gold Standard (2012-10-24 14:38:26)  

## 2017-10-08 NOTE — Progress Notes (Signed)
New Patient-Establish Care  Subjective:    Patient ID: Carla Bauer, female    DOB: 07/08/1955, 62 y.o.   MRN: [id number]   PCP: Kathe Becton, NP  Chief Complaint  Patient presents with  . Establish Care  . Hospitalization Follow-up    HPI  Carla Bauer does not have a pertinent past medical history. She is here today for hospital follow up after suffering a Stroke.   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 Since her discharge from hospital, she is doing well with no complaints. She was discharge with [place], Physical Therapy, and Occupational Therapy because of right-sided residual weakness. She continues to have peripheral edema in her right extremities, more pronounced in her right foot, ankle, and hand. She receive PEG-Tube in upper abdomen while hospitalized. She is now eating well and states that she no longer uses feeding tube. She is scheduled to have it removed in 3 weeks. She is accompanied by her daughter today.   Reports increased fatigue. She denies fevers, chills, recent infections, weight loss, and night sweats.   She has not had any headaches, visual changes, dizziness, and falls.   No chest pain, heart palpitations, cough and shortness of breath reported.   No reports of GI problems such as nausea, vomiting, diarrhea, and constipation. She has no reports of blood in stools, dysuria and hematuria.   No depression or anxiety.    She denies pain today.   History reviewed. No pertinent past medical history.  Family History  Problem Relation Age of Onset  . Hypertension Mother     Social History   Socioeconomic History  . Marital status: Single    Spouse name: Not on file  . Number of children: Not on file  . Years of education: Not on file  . Highest education level: Not on file  Occupational History  . Not on file  Social Needs  . Financial  resource strain: Not on file  . Food insecurity:    Worry: Not on file    Inability: Not on file  . Transportation needs:    Medical: Not on file    Non-medical: Not on file  Tobacco Use  . Smoking status: Never Smoker  . Smokeless tobacco: Never Used  Substance and Sexual Activity  . Alcohol use: No  . Drug use: No  . Sexual activity: Not on file  Lifestyle  . Physical activity:    Days per week: Not on file    Minutes per session: Not on file  . Stress: Not on file  Relationships  . Social connections:    Talks on phone: Not on file    Gets together: Not on file    Attends religious service: Not on file    Active member of club or organization: Not on file    Attends meetings of clubs or organizations: Not on file    Relationship status: Not on file  . Intimate partner violence:    Fear of current or ex partner: Not on file    Emotionally abused: Not on file    Physically abused: Not on file    Forced sexual activity: Not on file  Other Topics Concern  . Not on file  Social History Narrative  . Not on file    Past Surgical History:  Procedure Laterality Date  . CESAREAN SECTION    . 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 IR GASTROSTOMY TUBE MOD SED  09/16/2017  . LAPAROSCOPIC REVISION VENTRICULAR-PERITONEAL (V-P) SHUNT N/A 08/29/2017   Procedure: LAPAROSCOPIC REVISION VENTRICULAR-PERITONEAL (V-P) SHUNT;  Surgeon: Coralie Keens, MD;  Location: [facility];  Service: General;  Laterality: N/A;  LAPAROSCOPIC REVISION VENTRICULAR-PERITONEAL (V-P) SHUNT  . VENTRICULOPERITONEAL SHUNT Right 08/29/2017   Procedure: SHUNT INSERTION VENTRICULAR-PERITONEAL;  Surgeon: Consuella Lose, MD;  Location: [facility];  Service: Neurosurgery;  Laterality: Right;  SHUNT INSERTION VENTRICULAR-PERITONEAL     There is no  immunization history on file for this patient.  Current Meds  Medication Sig  . aspirin 81 MG chewable tablet Chew 1 tablet (81 mg total) by mouth daily.  . insulin glargine (LANTUS) 100 unit/mL SOPN Inject 0.2 mLs (20 Units total) into the skin at bedtime.  . metoprolol tartrate (LOPRESSOR) 50 MG tablet Take two tablets ( 100 mg ) crushed  via Peg tube  Twice a day  . Water For Irrigation, Sterile (FREE WATER) SOLN Place 200 mLs into feeding tube 5 (five) times daily.    No Known Allergies  BP 134/84 (BP Location: Left Arm, Patient Position: Sitting, Cuff Size: Small)   Pulse 77   Temp 98 F (36.7 C) (Oral)   Ht _0  (1.549 m)   SpO2 98%   BMI 28.95 kg/m   Review of Systems  Constitutional: Negative.   HENT: Negative.   Eyes: Negative.   Respiratory: Negative.   Cardiovascular: Negative.   Gastrointestinal: Positive for abdominal distention.       PEG tube upper abdomen.  Endocrine: Negative.   Genitourinary: Negative.   Musculoskeletal:       Weakness right extremities.   Skin: Negative.   Allergic/Immunologic: Negative.   Neurological: Positive for dizziness, weakness (s/p: Stroke; Right sided weakness) and light-headedness.  Hematological: Negative.   Psychiatric/Behavioral: Negative.    Objective:   Physical Exam  Constitutional: She is oriented to person, place, and time. She appears well-developed and well-nourished.  HENT:  Head: Normocephalic and atraumatic.  Right Ear: External ear normal.  Left Ear: External ear normal.  Nose: Nose normal.  Mouth/Throat: Oropharynx is clear and moist.  Eyes: Pupils are equal, round, and reactive to light. Conjunctivae and EOM are normal.  Neck: Normal range of motion. Neck supple.  Cardiovascular: Normal rate, regular rhythm, normal heart sounds and intact distal pulses.  Pulmonary/Chest: Effort normal and breath sounds normal.  Abdominal: Soft. Bowel sounds are normal.  PEG tube in upper abdomen. No signs or symptoms  of infection noted. Dsg is CD& I.  Musculoskeletal:  Right extremity weakness, r/t recent Stroke  Neurological: She is alert and oriented to person, place, and time.  Skin: Skin is warm and dry. Capillary refill takes less than 2 seconds.  Psychiatric: She has a normal mood and affect. Her behavior is normal. Judgment and thought content normal.  Nursing note and vitals reviewed.  Assessment & Plan:   1. Prediabetes She does not have a history of Diabetes. Her blood glucose levels increased which she was hospitalized. Hgb A1c is at normal level of 5.3 today. We will send Rx or blood glucose meter, she will test nightly and hold Lantus injection if levels are normal. We will follow up with her in 2 weeks to re-evaluate need for continual Insulin.  - POCT glycosylated hemoglobin (Hb A1C) - POCT urinalysis dipstick - blood glucose  meter kit and supplies KIT; Dispense based on patient and insurance preference. Use up to four times daily as directed. (FOR ICD-9 250.00, 250.01).  Dispense: 1 each; Refill: 0  2. Urinary tract infection with hematuria, site unspecified We will send Rx for Bactrim to pharmacy today. We will order Urine Culture to evaluate further.  - sulfamethoxazole-trimethoprim (BACTRIM DS,SEPTRA DS) 800-160 MG tablet; Take 1 tablet by mouth 2 (two) times daily.  Dispense: 20 tablet; Refill: 0  3. Abnormal urinalysis  - Urine Culture  4. 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 Daughter will monitor and catheterize patient if for urinary retention if needed, as needed.   5. Follow up She will follow up in 2 weeks.   Meds ordered this encounter  Medications  . sulfamethoxazole-trimethoprim (BACTRIM DS,SEPTRA DS) 800-160 MG tablet    Sig: Take 1 tablet by mouth 2 (two) times daily.    Dispense:  20 tablet    Refill:  0  . blood glucose meter kit and supplies KIT    Sig: Dispense based on patient and insurance preference. Use up to four times daily as  directed. (FOR ICD-9 250.00, 250.01).    Dispense:  1 each    Refill:  0    Order Specific Question:   Number of strips    Answer:   100    Order Specific Question:   Number of lancets    Answer:   [place],  MSN, FNP-C Patient [facility] [address] [place], [place] [address] [phone]

## 2017-10-11 ENCOUNTER — Telehealth: Payer: Self-pay

## 2017-10-11 LAB — URINE CULTURE

## 2017-10-11 NOTE — Telephone Encounter (Signed)
Jeff,PT/[facility] called requesting PT 1wk4 for gait, balance, strength and fall prevention. Verbal orders given per discharge summary.

## 2017-10-15 ENCOUNTER — Telehealth: Payer: Self-pay | Admitting: *Deleted

## 2017-10-15 ENCOUNTER — Other Ambulatory Visit: Payer: Self-pay | Admitting: Family Medicine

## 2017-10-15 ENCOUNTER — Other Ambulatory Visit: Payer: Self-pay | Admitting: Physical Medicine & Rehabilitation

## 2017-10-15 DIAGNOSIS — I69391 Dysphagia following cerebral infarction: Secondary | ICD-10-CM

## 2017-10-15 DIAGNOSIS — N390 Urinary tract infection, site not specified: Secondary | ICD-10-CM

## 2017-10-15 DIAGNOSIS — R319 Hematuria, unspecified: Secondary | ICD-10-CM

## 2017-10-15 MED ORDER — NITROFURANTOIN MONOHYD MACRO 100 MG PO CAPS: 100.0000 mg | ORAL_CAPSULE | Freq: Two times a day (BID) | ORAL | 0 refills | Status: DC

## 2017-10-15 NOTE — Telephone Encounter (Signed)
May send in a prescription for glucometer, strips and lancets

## 2017-10-15 NOTE — Telephone Encounter (Signed)
Marcelle SmilingNatasha, RN, [facility] left a message statoing that patient is in desperate need of a means to check his blood sugar.  I spoke with the RN and she says patient is medicaid pending and has not yet established PCP.  She is asking for a script from Dr. Wynn BankerKirsteins for a Glucometer (accucheck), strips, and lancets.

## 2017-10-15 NOTE — Progress Notes (Signed)
Rx for Macrobid to pharmacy. 

## 2017-10-16 ENCOUNTER — Other Ambulatory Visit: Payer: Self-pay | Admitting: Family Medicine

## 2017-10-16 DIAGNOSIS — N390 Urinary tract infection, site not specified: Secondary | ICD-10-CM

## 2017-10-16 DIAGNOSIS — R319 Hematuria, unspecified: Secondary | ICD-10-CM

## 2017-10-16 MED ORDER — NITROFURANTOIN MONOHYD MACRO 100 MG PO CAPS: 100.0000 mg | ORAL_CAPSULE | Freq: Two times a day (BID) | ORAL | 0 refills | Status: AC

## 2017-10-16 NOTE — Progress Notes (Signed)
[facility] [address] today. Patient is aware.

## 2017-10-16 NOTE — Telephone Encounter (Signed)
I checked pt chart and it seems that a blood glucose meter kit and supplies kit was sent to [facility] on [address] by Kathe Becton, FNP on 10/08/17. I called [name], RN to notify her and she states she will get in contact with the daughter of pt to let her know.

## 2017-10-21 ENCOUNTER — Telehealth: Payer: Self-pay | Admitting: *Deleted

## 2017-10-21 NOTE — Telephone Encounter (Addendum)
Samara [facility]istKathryn OT [facility] to request 2wk2,1wk2 visits.  Approval given per protocol.

## 2017-10-28 ENCOUNTER — Telehealth: Payer: Self-pay | Admitting: *Deleted

## 2017-10-28 ENCOUNTER — Encounter (HOSPITAL_COMMUNITY): Payer: Self-pay | Admitting: Emergency Medicine

## 2017-10-28 ENCOUNTER — Other Ambulatory Visit: Payer: Self-pay

## 2017-10-28 ENCOUNTER — Ambulatory Visit (HOSPITAL_COMMUNITY)
Admission: EM | Admit: 2017-10-28 | Discharge: 2017-10-28 | Disposition: A | Discharge status: Home / Self Care | Payer: Self-pay | Attending: Family Medicine | Admitting: Family Medicine

## 2017-10-28 DIAGNOSIS — I10 Essential (primary) hypertension: Secondary | ICD-10-CM

## 2017-10-28 MED ORDER — METOPROLOL TARTRATE 50 MG PO TABS: ORAL_TABLET | ORAL | 0 refills | Status: DC

## 2017-10-28 NOTE — Telephone Encounter (Signed)
Home health nurse call;ed to report that patient was scheduled to be discharged from home health. She is concerned because patient does not have all her meds. She asked if Dr. Riley KillSwartz had an advise.  I contacted the home health nurse and informed that Dr. Riley KillSwartz was on vacation. She verbalized understanding. Since then it was discovered that the patient is indeed Dr. Wynn BankerKirsteins patient and he is now on vacation. Side Note: secondary call from [facility] indicates patient went to urgent care for treatment of hypertension 0n 10/28/2017

## 2017-10-28 NOTE — Discharge Instructions (Signed)
It was nice meeting you!!  We refilled your blood pressure medication. Please follow up with your doctor on Wednesday as scheduled.

## 2017-10-28 NOTE — Telephone Encounter (Signed)
[facility] reports high BP and heart rate.  BP was 152/100 and heart rate was recorded at 112 and 124 bpm. Therapist reports that [facility]. Dr. Wynn BankerKirsteins is on vacation

## 2017-10-28 NOTE — ED Provider Notes (Signed)
[place]    CSN: [id number] Arrival date & time: 10/28/17  1408     History   Chief Complaint Chief Complaint  Patient presents with  . Hypertension    HPI Carla Bauer is a 62 y.o. female.   Patient is a 62 year old female that presents for medication refill.  She has not had her blood pressure medicine  in over a week.  Her physical therapist took her blood pressure today it was 152/104.  They were concerned giving her history of aneurysm and stroke.  She has an appointment this coming Wednesday with her PCP.  She denies any headaches, dizziness, blurry vision vision changes.  She denies any chest pain, shortness of breath.  Patient reports that she feels her baseline giving multiple deficits from previous stroke.  Family is with patient and agrees.   ROS per HPI      History reviewed. No pertinent past medical history.  Patient Active Problem List   Diagnosis Date Noted  . Benign essential HTN   . Hypernatremia   . Dysphagia due to recent stroke   . Spastic hemiparesis ([place])   . S/P percutaneous endoscopic gastrostomy (PEG) tube placement ([place])   . Labile blood glucose   . Sinus tachycardia   . Intraparenchymal hematoma of brain ([place]) 09/03/2017  . Right hemiparesis ([place])   . Oropharyngeal dysphagia   . Essential hypertension   . Acute lower UTI   . Tachypnea   . Tachycardia   . Reactive hypertension   . Prediabetes   . Acute blood loss anemia   . Hypokalemia   . Fever   . Leukocytosis   . Acute respiratory failure with hypoxia ([place])   . Cerebrovascular accident (CVA) due to thrombosis of left posterior cerebral artery ([place])   . ICH (intracerebral hemorrhage) ([place]) 08/14/2017  . SAH (subarachnoid hemorrhage) ([place]) 08/14/2017  . 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 Hydrocephalus 08/14/2017  . Brain herniation ([place]) 08/14/2017  . Cytotoxic brain edema ([place]) 08/14/2017    Past  Surgical History:  Procedure Laterality Date  . CESAREAN SECTION    . 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 IR GASTROSTOMY TUBE MOD SED  09/16/2017  . LAPAROSCOPIC REVISION VENTRICULAR-PERITONEAL (V-P) SHUNT N/A 08/29/2017   Procedure: LAPAROSCOPIC REVISION VENTRICULAR-PERITONEAL (V-P) SHUNT;  Surgeon: Coralie Keens, MD;  Location: [place];  Service: General;  Laterality: N/A;  LAPAROSCOPIC REVISION VENTRICULAR-PERITONEAL (V-P) SHUNT  . VENTRICULOPERITONEAL SHUNT Right 08/29/2017   Procedure: SHUNT INSERTION VENTRICULAR-PERITONEAL;  Surgeon: Consuella Lose, MD;  Location: [facility];  Service: Neurosurgery;  Laterality: Right;  SHUNT INSERTION VENTRICULAR-PERITONEAL    OB History   None      Home Medications    Prior to Admission medications   Medication Sig Start Date End Date Taking? Authorizing Provider  aspirin 81 MG chewable tablet Chew 1 tablet (81 mg total) by mouth daily. 09/04/17  Yes Costella, Vista Mink, PA-C  insulin glargine (LANTUS) 100 unit/mL SOPN Inject 0.2 mLs (20 Units total) into the skin at bedtime. 09/26/17  Yes Angiulli, Lavon Paganini, PA-C  sulfamethoxazole-trimethoprim (BACTRIM DS,SEPTRA DS) 800-160 MG tablet Take 1 tablet by mouth 2 (two) times daily. 10/08/17  Yes Azzie Glatter, FNP  Amino Acids-Protein Hydrolys (FEEDING SUPPLEMENT, PRO-STAT SUGAR FREE 64,) LIQD Place 30 mLs into feeding tube 3 (three)  times daily. Patient not taking: Reported on 10/08/2017 09/26/17   Angiulli, Lavon Paganini, PA-C  blood glucose meter kit and supplies KIT Dispense based on patient and insurance preference. Use up to four times daily as directed. (FOR ICD-9 250.00, 250.01). 10/08/17   Azzie Glatter, FNP  Maltodextrin-Xanthan Gum (RESOURCE THICKENUP CLEAR) POWD Take 120 g by mouth as needed. Patient not taking: Reported on  10/08/2017 09/26/17   Angiulli, Lavon Paganini, PA-C  metoprolol tartrate (LOPRESSOR) 50 MG tablet Take two tablets ( 100 mg ) crushed  via Peg tube  Twice a day 10/28/17   Loura Halt A, NP  Nutritional Supplements (FEEDING SUPPLEMENT, OSMOLITE 1.5 CAL,) LIQD Place 240 mLs into feeding tube 4 (four) times daily. Patient not taking: Reported on 10/08/2017 09/26/17   Angiulli, Lavon Paganini, PA-C  polycarbophil (FIBERCON) 625 MG tablet Take 1 tablet (625 mg total) by mouth daily. Patient not taking: Reported on 10/08/2017 09/27/17   Angiulli, Lavon Paganini, PA-C  QUEtiapine (SEROQUEL) 25 MG tablet Place 1 tablet (25 mg total) into feeding tube at bedtime. Patient not taking: Reported on 10/08/2017 09/26/17   Angiulli, Lavon Paganini, PA-C  tamsulosin (FLOMAX) 0.4 MG CAPS capsule Take 1 capsule (0.4 mg total) by mouth daily after supper. Patient not taking: Reported on 10/08/2017 09/26/17   Angiulli, Lavon Paganini, PA-C  Water For Irrigation, Sterile (FREE WATER) SOLN Place 200 mLs into feeding tube 5 (five) times daily. 09/26/17   Angiulli, Lavon Paganini, PA-C    Family History Family History  Problem Relation Age of Onset  . Hypertension Mother     Social History Social History   Tobacco Use  . Smoking status: Never Smoker  . Smokeless tobacco: Never Used  Substance Use Topics  . Alcohol use: No  . Drug use: No     Allergies   Patient has no known allergies.   Review of Systems Review of Systems   Physical Exam Triage Vital Signs ED Triage Vitals  Enc Vitals Group     BP 10/28/17 1441 (!) 152/92     Pulse Rate 10/28/17 1441 (!) 129     Resp --      Temp 10/28/17 1441 98.2 F (36.8 C)     Temp Source 10/28/17 1441 Oral     SpO2 10/28/17 1441 99 %     Weight --      Height --      Head Circumference --      Peak Flow --      Pain Score 10/28/17 1439 0     Pain Loc --      Pain Edu? --      Excl. in [place]? --    No data found.  Updated Vital Signs BP (!) 152/92 (BP Location: Left Arm)   Pulse (!) 129   Temp  98.2 F (36.8 C) (Oral)   SpO2 99%   Visual Acuity Right Eye Distance:   Left Eye Distance:   Bilateral Distance:    Right Eye Near:   Left Eye Near:    Bilateral Near:     Physical Exam  Constitutional: She appears well-developed and well-nourished. No distress.  HENT:  Head: Normocephalic and atraumatic.  Cardiovascular: Normal rate, normal heart sounds and intact distal pulses.  tachycardia  Pulmonary/Chest: Effort normal and breath sounds normal.  Neurological: She is alert.  Right-sided facial droop and right-sided upper and lower extremity weakness due to previous stroke.  Skin: Skin is warm and dry. Capillary refill takes  less than 2 seconds.  Psychiatric: She has a normal mood and affect.  Nursing note and vitals reviewed.    UC Treatments / Results  Labs (all labs ordered are listed, but only abnormal results are displayed) Labs Reviewed - No data to display  EKG None  Radiology No results found.  Procedures Procedures (including critical care time)  Medications Ordered in UC Medications - No data to display  Initial Impression / Assessment and Plan / UC Course  I have reviewed the triage vital signs and the nursing notes.  Pertinent labs & imaging results that were available during my care of the patient were reviewed by me and considered in my medical decision making (see chart for details).     The tachycardia is likely due to not having her metoprolol in a week.  She has having no other concerning signs or symptoms on physical exam.   We will refill her blood pressure medication and have her follow-up with her PCP on Wednesday as scheduled. Return precautions given.  Final Clinical Impressions(s) / UC Diagnoses   Final diagnoses:  Essential hypertension     Discharge Instructions     It was nice meeting you!!  We refilled your blood pressure medication. Please follow up with your doctor on Wednesday as scheduled.     ED Prescriptions      Medication Sig Dispense Auth. Provider   metoprolol tartrate (LOPRESSOR) 50 MG tablet Take two tablets ( 100 mg ) crushed  via Peg tube  Twice a day 60 tablet Kinzi Frediani A, NP     Controlled Substance Prescriptions [place] Controlled Substance Registry consulted? Not Applicable   Orvan July, NP 10/28/17 1812

## 2017-10-28 NOTE — ED Triage Notes (Signed)
Pt has a history of stroke/aneurism.  Pt was on HBP meds but they ran out.  Pt's next appt with her PCP is this Wed.  She had a home health visit today and her BP was 152/100.

## 2017-10-30 ENCOUNTER — Ambulatory Visit: Payer: Self-pay | Admitting: Family Medicine

## 2017-11-01 ENCOUNTER — Encounter: Payer: Self-pay | Admitting: Family Medicine

## 2017-11-01 ENCOUNTER — Ambulatory Visit (INDEPENDENT_AMBULATORY_CARE_PROVIDER_SITE_OTHER): Payer: Self-pay | Admitting: Family Medicine

## 2017-11-01 VITALS — BP 134/88 | HR 77 | Temp 98.0°F | Ht 61.0 in | Wt 143.0 lb

## 2017-11-01 DIAGNOSIS — R8290 Unspecified abnormal findings in urine: Secondary | ICD-10-CM

## 2017-11-01 DIAGNOSIS — Z8673 Personal history of transient ischemic attack (TIA), and cerebral infarction without residual deficits: Secondary | ICD-10-CM

## 2017-11-01 DIAGNOSIS — Z0000 Encounter for general adult medical examination without abnormal findings: Secondary | ICD-10-CM

## 2017-11-01 DIAGNOSIS — R531 Weakness: Secondary | ICD-10-CM

## 2017-11-01 DIAGNOSIS — Z09 Encounter for follow-up examination after completed treatment for conditions other than malignant neoplasm: Secondary | ICD-10-CM

## 2017-11-01 LAB — POCT URINALYSIS DIP (MANUAL ENTRY)
Bilirubin, UA: NEGATIVE
Glucose, UA: NEGATIVE mg/dL
Ketones, POC UA: NEGATIVE mg/dL
Leukocytes, UA: NEGATIVE
Nitrite, UA: NEGATIVE
Protein Ur, POC: NEGATIVE mg/dL
Spec Grav, UA: 1.01 (ref 1.010–1.025)
Urobilinogen, UA: 0.2 E.U./dL
pH, UA: 7 (ref 5.0–8.0)

## 2017-11-01 LAB — GLUCOSE, POCT (MANUAL RESULT ENTRY): POC Glucose: 59 mg/dl — AB (ref 70–99)

## 2017-11-01 NOTE — Progress Notes (Signed)
Follow Up  Subjective:    Patient ID: Carla Bauer, female    DOB: [birth year], 62 y.o.   MRN: [id number]   Chief Complaint  Patient presents with  . Follow-up    2 weeks stroke   HPI Carla Bauer has a recent past history of Stroke. She is here today for follow up.   Current Status: Patient is s/p: Stroke. Since her last office visit, she has been doing well with no complaints. She is accompanied by her daughter.She arrives in a wheelchair. She currently uses a walker at home, but is getting upgraded to only using a cane.  She continues to take Lantus injection every night.   She continues OT and PT a few times a week.   She denies fevers, chills, recent infections, weight loss, and night sweats. She has not had any headaches, visual changes, dizziness, and falls. No chest pain, heart palpitations, cough and shortness of breath reported. No reports of GI problems such as nausea, vomiting, diarrhea, and constipation. She has no reports of blood in stools, dysuria and hematuria. No depression or anxiety reported. She denies pain today.   History reviewed. No pertinent past medical history.  Social History   Socioeconomic History  . Marital status: Single    Spouse name: Not on file  . Number of children: Not on file  . Years of education: Not on file  . Highest education level: Not on file  Occupational History  . Not on file  Social Needs  . Financial resource strain: Not on file  . Food insecurity:    Worry: Not on file    Inability: Not on file  . Transportation needs:    Medical: Not on file    Non-medical: Not on file  Tobacco Use  . Smoking status: Never Smoker  . Smokeless tobacco: Never Used  Substance and Sexual Activity  . Alcohol use: No  . Drug use: No  . Sexual activity: Not on file  Lifestyle  . Physical activity:    Days per week: Not on file    Minutes per session: Not on file  . Stress: Not on file  Relationships  . Social connections:    Talks  on phone: Not on file    Gets together: Not on file    Attends religious service: Not on file    Active member of club or organization: Not on file    Attends meetings of clubs or organizations: Not on file    Relationship status: Not on file  . Intimate partner violence:    Fear of current or ex partner: Not on file    Emotionally abused: Not on file    Physically abused: Not on file    Forced sexual activity: Not on file  Other Topics Concern  . Not on file  Social History Narrative  . Not on file    Past Surgical History:  Procedure Laterality Date  . CESAREAN SECTION    . 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 IR GASTROSTOMY TUBE MOD SED  09/16/2017  . LAPAROSCOPIC REVISION VENTRICULAR-PERITONEAL (V-P) SHUNT N/A 08/29/2017   Procedure: LAPAROSCOPIC REVISION VENTRICULAR-PERITONEAL (V-P) SHUNT;  Surgeon: Coralie Keens, MD;  Location: [facility];  Service: General;  Laterality: N/A;  LAPAROSCOPIC REVISION VENTRICULAR-PERITONEAL (V-P) SHUNT  . VENTRICULOPERITONEAL SHUNT Right 08/29/2017   Procedure: SHUNT INSERTION  VENTRICULAR-PERITONEAL;  Surgeon: Consuella Lose, MD;  Location: [facility];  Service: Neurosurgery;  Laterality: Right;  SHUNT INSERTION VENTRICULAR-PERITONEAL     There is no immunization history on file for this patient.  Current Meds  Medication Sig  . aspirin 81 MG chewable tablet Chew 1 tablet (81 mg total) by mouth daily.  . blood glucose meter kit and supplies KIT Dispense based on patient and insurance preference. Use up to four times daily as directed. (FOR ICD-9 250.00, 250.01).  . metoprolol tartrate (LOPRESSOR) 50 MG tablet Take two tablets ( 100 mg ) crushed  via Peg tube  Twice a day    No Known Allergies  BP 134/88 (BP Location: Left Arm, Patient Position: Sitting, Cuff Size: Small)   Pulse  77   Temp 98 F (36.7 C) (Oral)   Ht 5' 1"  (1.549 m)   Wt 143 lb (64.9 kg)   SpO2 100%   BMI 27.02 kg/m   Review of Systems  Constitutional: Positive for fatigue.  HENT: Negative.   Eyes: Negative.   Respiratory: Negative.   Cardiovascular: Negative.   Gastrointestinal: Negative.   Endocrine: Negative.   Musculoskeletal: Negative.   Skin: Negative.   Allergic/Immunologic: Negative.   Neurological: Positive for weakness and numbness.       Right extremity weakness.   Hematological: Negative.   Psychiatric/Behavioral: Positive for sleep disturbance.     Objective:   Physical Exam  Constitutional: She appears well-developed and well-nourished.  HENT:  Head: Normocephalic and atraumatic.  Right Ear: External ear normal.  Mouth/Throat: Oropharynx is clear and moist.  Eyes: Pupils are equal, round, and reactive to light. Conjunctivae and EOM are normal.  Neck: Normal range of motion. Neck supple.  Cardiovascular: Normal rate, regular rhythm, normal heart sounds and intact distal pulses.  Pulmonary/Chest: Effort normal and breath sounds normal.  Abdominal: Soft. Bowel sounds are normal.  Musculoskeletal: Normal range of motion.  Neurological: She exhibits abnormal muscle tone (right extremities). Coordination (right extremities) abnormal.  Skin: Skin is warm and dry. Capillary refill takes less than 2 seconds.  Psychiatric: She has a normal mood and affect. Her behavior is normal. Judgment and thought content normal.  Nursing note and vitals reviewed.  Assessment & Plan:   1. 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   Patient was not previously diagnosed with Diabetes prior to hospital admission. She was placed on Insulin in hospital to control blood glucose levels. Her Glucose was at 59 today at office. Daughter states that her levels have been in the 50s since she was discharged from hospital. She will discontinue Lantus. We will re-evaluated blood  glucose level at next office visit.   2. Right sided weakness Stable. PT and OT continues home visits twice weekly. She has upgraded to using cane for ambulation. No reports of falls.   3. Healthcare maintenance - CBC; Future - Comprehensive metabolic panel - CBC - POCT urinalysis dipstick - POCT glucose (manual entry)  4. Abnormal urinalysis - Urine Culture  5. Follow up Follow up in 3 months.   No orders of the defined types were placed in this encounter.  Kathe Becton,  MSN, FNP-C [facility] [address] [place], [place] [address] [phone]

## 2017-11-02 LAB — COMPREHENSIVE METABOLIC PANEL
ALT: 15 IU/L (ref 0–32)
AST: 14 IU/L (ref 0–40)
Albumin/Globulin Ratio: 1.4 (ref 1.2–2.2)
Albumin: 4.1 g/dL (ref 3.6–4.8)
Alkaline Phosphatase: 89 IU/L (ref 39–117)
BUN/Creatinine Ratio: 9 — ABNORMAL LOW (ref 12–28)
BUN: 6 mg/dL — ABNORMAL LOW (ref 8–27)
Bilirubin Total: 0.3 mg/dL (ref 0.0–1.2)
CO2: 25 mmol/L (ref 20–29)
Calcium: 9.9 mg/dL (ref 8.7–10.3)
Chloride: 104 mmol/L (ref 96–106)
Creatinine, Ser: 0.65 mg/dL (ref 0.57–1.00)
GFR calc Af Amer: 109 mL/min/{1.73_m2} (ref >59)
GFR calc non Af Amer: 95 mL/min/{1.73_m2} (ref >59)
Globulin, Total: 3 g/dL (ref 1.5–4.5)
Glucose: 76 mg/dL (ref 65–99)
Potassium: 3.4 mmol/L — ABNORMAL LOW (ref 3.5–5.2)
Sodium: 146 mmol/L — ABNORMAL HIGH (ref 134–144)
Total Protein: 7.1 g/dL (ref 6.0–8.5)

## 2017-11-02 LAB — CBC
Hematocrit: 38 % (ref 34.0–46.6)
Hemoglobin: 12 g/dL (ref 11.1–15.9)
MCH: 28.6 pg (ref 26.6–33.0)
MCHC: 31.6 g/dL (ref 31.5–35.7)
MCV: 91 fL (ref 79–97)
Platelets: 301 10*3/uL (ref 150–450)
RBC: 4.2 x10E6/uL (ref 3.77–5.28)
RDW: 14.7 % (ref 12.3–15.4)
WBC: 5.3 10*3/uL (ref 3.4–10.8)

## 2017-11-03 LAB — URINE CULTURE

## 2017-11-04 NOTE — Telephone Encounter (Signed)
Patient notified of message and states that she understands.

## 2017-11-04 NOTE — Telephone Encounter (Signed)
-----   Message from Kallie LocksNatalie M Stroud, FNP sent at 11/03/2017  1:11 PM EDT ----- Regarding: "Lab Results" Carla Bauer,   Inform patient that lab results are stable. Potassium is slightly low. She should increase intake of foods high in potassium like bananas and green leafy foods. She should keep follow up appointment.  Please make sure that she is no longer using insulin.    Thank you.

## 2017-11-13 ENCOUNTER — Ambulatory Visit (HOSPITAL_COMMUNITY)
Admission: EM | Admit: 2017-11-13 | Discharge: 2017-11-13 | Disposition: A | Discharge status: Home / Self Care | Payer: Self-pay | Attending: Family Medicine | Admitting: Family Medicine

## 2017-11-13 ENCOUNTER — Encounter (HOSPITAL_COMMUNITY): Payer: Self-pay | Admitting: Emergency Medicine

## 2017-11-13 DIAGNOSIS — I10 Essential (primary) hypertension: Secondary | ICD-10-CM

## 2017-11-13 MED ORDER — METOPROLOL TARTRATE 100 MG PO TABS: ORAL_TABLET | ORAL | 1 refills | Status: DC

## 2017-11-13 NOTE — ED Triage Notes (Signed)
Pt has hx of stroke/aneurism. Pt ran out of her BP medicines. Pt requesting refill.

## 2017-11-16 NOTE — ED Provider Notes (Signed)
[name]   [id number] 11/13/17 Arrival Time: 9211  ASSESSMENT & PLAN:  1. Essential hypertension     Meds ordered this encounter  Medications  . metoprolol tartrate (LOPRESSOR) 100 MG tablet    Sig: Take one tablet twice daily.    Dispense:  60 tablet    Refill:  1   Will plan f/u with PCP for further refills. Reviewed expectations re: course of current medical issues. Questions answered. Outlined signs and symptoms indicating need for more acute intervention. Patient verbalized understanding. After Visit Summary given.   SUBJECTIVE: History from: patient. Carla Bauer is a 62 y.o. female who presents requesting medication refill; HTN medication. No current concerns.  Current medical problems include: HTN  Current Outpatient Medications:  .  Amino Acids-Protein Hydrolys (FEEDING SUPPLEMENT, PRO-STAT SUGAR FREE 64,) LIQD, Place 30 mLs into feeding tube 3 (three) times daily. (Patient not taking: Reported on 10/08/2017), Disp: 900 mL, Rfl: 0 .  aspirin 81 MG chewable tablet, Chew 1 tablet (81 mg total) by mouth daily., Disp: 30 tablet, Rfl: 0 .  blood glucose meter kit and supplies KIT, Dispense based on patient and insurance preference. Use up to four times daily as directed. (FOR ICD-9 250.00, 250.01)., Disp: 1 each, Rfl: 0 .  insulin glargine (LANTUS) 100 unit/mL SOPN, Inject 0.2 mLs (20 Units total) into the skin at bedtime. (Patient not taking: Reported on 11/01/2017), Disp: 15 mL, Rfl: 11 .  Maltodextrin-Xanthan Gum (RESOURCE THICKENUP CLEAR) POWD, Take 120 g by mouth as needed. (Patient not taking: Reported on 10/08/2017), Disp: 5 Can, Rfl: 1 .  metoprolol tartrate (LOPRESSOR) 100 MG tablet, Take one tablet twice daily., Disp: 60 tablet, Rfl: 1 .  Nutritional Supplements (FEEDING SUPPLEMENT, OSMOLITE 1.5 CAL,) LIQD, Place 240 mLs into feeding tube 4 (four) times daily. (Patient not taking: Reported on 10/08/2017), Disp: 5 Bottle, Rfl: 0 .  polycarbophil (FIBERCON)  625 MG tablet, Take 1 tablet (625 mg total) by mouth daily. (Patient not taking: Reported on 10/08/2017), Disp: , Rfl:  .  QUEtiapine (SEROQUEL) 25 MG tablet, Place 1 tablet (25 mg total) into feeding tube at bedtime. (Patient not taking: Reported on 10/08/2017), Disp: 30 tablet, Rfl: 0 .  sulfamethoxazole-trimethoprim (BACTRIM DS,SEPTRA DS) 800-160 MG tablet, Take 1 tablet by mouth 2 (two) times daily. (Patient not taking: Reported on 11/01/2017), Disp: 20 tablet, Rfl: 0 .  tamsulosin (FLOMAX) 0.4 MG CAPS capsule, Take 1 capsule (0.4 mg total) by mouth daily after supper. (Patient not taking: Reported on 10/08/2017), Disp: 30 capsule, Rfl: 1  ROS: As per HPI.   OBJECTIVE:  Vitals:   11/13/17 1733  BP: (!) 144/85  Pulse: 80  Resp: 18  Temp: 98.1 F (36.7 C)  TempSrc: Oral  SpO2: 100%    General appearance: alert; no distress Eyes: conjunctiva normal Lungs: clear to auscultation bilaterally Heart: regular rate and rhythm Extremities: no cyanosis or edema; symmetrical with no gross deformities Skin: warm and dry Psychological: alert and cooperative; normal mood and affect  Labs: Results for orders placed or performed in visit on 11/01/17  Urine Culture  Result Value Ref Range   Urine Culture, Routine Final report    Organism ID, Bacteria Comment   Comprehensive metabolic panel  Result Value Ref Range   Glucose 76 65 - 99 mg/dL   BUN 6 (L) 8 - 27 mg/dL   Creatinine, Ser 0.65 0.57 - 1.00 mg/dL   GFR calc non Af Amer 95 >59 mL/min/1.73   GFR calc  Af Amer 109 >59 mL/min/1.73   BUN/Creatinine Ratio 9 (L) 12 - 28   Sodium 146 (H) 134 - 144 mmol/L   Potassium 3.4 (L) 3.5 - 5.2 mmol/L   Chloride 104 96 - 106 mmol/L   CO2 25 20 - 29 mmol/L   Calcium 9.9 8.7 - 10.3 mg/dL   Total Protein 7.1 6.0 - 8.5 g/dL   Albumin 4.1 3.6 - 4.8 g/dL   Globulin, Total 3.0 1.5 - 4.5 g/dL   Albumin/Globulin Ratio 1.4 1.2 - 2.2   Bilirubin Total 0.3 0.0 - 1.2 mg/dL   Alkaline Phosphatase 89 39 - 117 IU/L    AST 14 0 - 40 IU/L   ALT 15 0 - 32 IU/L  CBC  Result Value Ref Range   WBC 5.3 3.4 - 10.8 x10E3/uL   RBC 4.20 3.77 - 5.28 x10E6/uL   Hemoglobin 12.0 11.1 - 15.9 g/dL   Hematocrit 38.0 34.0 - 46.6 %   MCV 91 79 - 97 fL   MCH 28.6 26.6 - 33.0 pg   MCHC 31.6 31.5 - 35.7 g/dL   RDW 14.7 12.3 - 15.4 %   Platelets 301 150 - 450 x10E3/uL  POCT urinalysis dipstick  Result Value Ref Range   Color, UA  yellow   Clarity, UA  clear   Glucose, UA negative negative mg/dL   Bilirubin, UA negative negative   Ketones, POC UA negative negative mg/dL   Spec Grav, UA 1.010 1.010 - 1.025   Blood, UA small (A) negative   pH, UA 7.0 5.0 - 8.0   Protein Ur, POC negative negative mg/dL   Urobilinogen, UA 0.2 0.2 or 1.0 E.U./dL   Nitrite, UA Negative Negative   Leukocytes, UA Negative Negative  POCT glucose (manual entry)  Result Value Ref Range   POC Glucose 59 (A) 70 - 99 mg/dl   Labs Reviewed - No data to display  No Known Allergies  History reviewed. No pertinent past medical history. Social History   Socioeconomic History  . Marital status: Single    Spouse name: Not on file  . Number of children: Not on file  . Years of education: Not on file  . Highest education level: Not on file  Occupational History  . Not on file  Social Needs  . Financial resource strain: Not on file  . Food insecurity:    Worry: Not on file    Inability: Not on file  . Transportation needs:    Medical: Not on file    Non-medical: Not on file  Tobacco Use  . Smoking status: Never Smoker  . Smokeless tobacco: Never Used  Substance and Sexual Activity  . Alcohol use: No  . Drug use: No  . Sexual activity: Not on file  Lifestyle  . Physical activity:    Days per week: Not on file    Minutes per session: Not on file  . Stress: Not on file  Relationships  . Social connections:    Talks on phone: Not on file    Gets together: Not on file    Attends religious service: Not on file    Active member  of club or organization: Not on file    Attends meetings of clubs or organizations: Not on file    Relationship status: Not on file  . Intimate partner violence:    Fear of current or ex partner: Not on file    Emotionally abused: Not on file    Physically abused:  Not on file    Forced sexual activity: Not on file  Other Topics Concern  . Not on file  Social History Narrative  . Not on file   Family History  Problem Relation Age of Onset  . Hypertension Mother    Past Surgical History:  Procedure Laterality Date  . CESAREAN SECTION    . 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 IR GASTROSTOMY TUBE MOD SED  09/16/2017  . LAPAROSCOPIC REVISION VENTRICULAR-PERITONEAL (V-P) SHUNT N/A 08/29/2017   Procedure: LAPAROSCOPIC REVISION VENTRICULAR-PERITONEAL (V-P) SHUNT;  Surgeon: Coralie Keens, MD;  Location: [facility];  Service: General;  Laterality: N/A;  LAPAROSCOPIC REVISION VENTRICULAR-PERITONEAL (V-P) SHUNT  . VENTRICULOPERITONEAL SHUNT Right 08/29/2017   Procedure: SHUNT INSERTION VENTRICULAR-PERITONEAL;  Surgeon: Consuella Lose, MD;  Location: [facility];  Service: Neurosurgery;  Laterality: Right;  SHUNT INSERTION Otto Herb, MD 11/16/17 1010

## 2017-11-18 ENCOUNTER — Ambulatory Visit: Payer: Self-pay | Admitting: Physical Medicine & Rehabilitation

## 2017-11-18 ENCOUNTER — Encounter: Payer: Self-pay | Attending: Physical Medicine & Rehabilitation

## 2017-11-18 DIAGNOSIS — R4701 Aphasia: Secondary | ICD-10-CM | POA: Insufficient documentation

## 2017-11-18 DIAGNOSIS — R7303 Prediabetes: Secondary | ICD-10-CM | POA: Insufficient documentation

## 2017-11-18 DIAGNOSIS — R4702 Dysphasia: Secondary | ICD-10-CM | POA: Insufficient documentation

## 2017-11-18 DIAGNOSIS — S06353S Traumatic hemorrhage of left cerebrum with loss of consciousness of 1 hours to 5 hours 59 minutes, sequela: Secondary | ICD-10-CM | POA: Insufficient documentation

## 2017-11-29 ENCOUNTER — Ambulatory Visit (HOSPITAL_COMMUNITY): Admission: RE | Admit: 2017-11-29 | Payer: Self-pay | Source: Ambulatory Visit

## 2017-11-29 ENCOUNTER — Telehealth (HOSPITAL_COMMUNITY): Payer: Self-pay | Admitting: *Deleted

## 2017-11-29 NOTE — Telephone Encounter (Signed)
Rescheduled per daughter's request

## 2017-12-06 ENCOUNTER — Encounter (HOSPITAL_COMMUNITY): Payer: Self-pay | Admitting: Interventional Radiology

## 2017-12-06 ENCOUNTER — Ambulatory Visit (HOSPITAL_COMMUNITY)
Admission: RE | Admit: 2017-12-06 | Discharge: 2017-12-06 | Disposition: A | Discharge status: Home / Self Care | Payer: Medicaid Other | Source: Ambulatory Visit | Attending: Physical Medicine & Rehabilitation | Admitting: Physical Medicine & Rehabilitation

## 2017-12-06 DIAGNOSIS — Z431 Encounter for attention to gastrostomy: Secondary | ICD-10-CM | POA: Insufficient documentation

## 2017-12-06 DIAGNOSIS — I69391 Dysphagia following cerebral infarction: Secondary | ICD-10-CM

## 2017-12-06 HISTORY — PX: IR GASTROSTOMY TUBE REMOVAL: IMG5492

## 2017-12-06 MED ORDER — LIDOCAINE VISCOUS HCL 2 % MT SOLN
OROMUCOSAL | Status: AC
  Filled 2017-12-06: qty 15

## 2017-12-06 MED ORDER — LIDOCAINE VISCOUS HCL 2 % MT SOLN
OROMUCOSAL | Status: DC | PRN
  Administered 2017-12-06: 7 mL via OROMUCOSAL

## 2018-01-14 ENCOUNTER — Ambulatory Visit (HOSPITAL_COMMUNITY)
Admission: EM | Admit: 2018-01-14 | Discharge: 2018-01-14 | Disposition: A | Discharge status: Home / Self Care | Payer: Self-pay | Attending: Family Medicine | Admitting: Family Medicine

## 2018-01-14 ENCOUNTER — Encounter (HOSPITAL_COMMUNITY): Payer: Self-pay | Admitting: Emergency Medicine

## 2018-01-14 ENCOUNTER — Other Ambulatory Visit: Payer: Self-pay

## 2018-01-14 DIAGNOSIS — Z760 Encounter for issue of repeat prescription: Secondary | ICD-10-CM

## 2018-01-14 DIAGNOSIS — I10 Essential (primary) hypertension: Secondary | ICD-10-CM

## 2018-01-14 MED ORDER — METOPROLOL TARTRATE 100 MG PO TABS: ORAL_TABLET | ORAL | 0 refills | Status: DC

## 2018-01-14 NOTE — ED Triage Notes (Signed)
Pt here for medication refill for blood pressure. She does not know what the name of the medication is but the blood pressure medication found in her list is Lopressor 100mg .  Pt ran out of medication over 1 week ago.

## 2018-01-14 NOTE — Discharge Instructions (Addendum)
I will refill your medicine ONLY until you see your PCP It is better to get BP medicine from your PCP and NOT the urgent care center CALL your PCP when you get low on medicine

## 2018-01-14 NOTE — ED Provider Notes (Signed)
[facility]    CSN: [id number] Arrival date & time: 01/14/18  1115     History   Chief Complaint Chief Complaint  Patient presents with  . Medication Refill    HPI Carla Bauer is a 62 y.o. female.   HPI  Patient is seen for refill of blood pressure medication.  She has come here for refills of her blood pressure medication in July, and August, and again now.  She is not asking her family doctor for these medicines.  Uncertain reasons.  She sees her family doctor regularly.  I explained her the importance of getting all of her medicines refilled, at every visit, at her family doctor's office and not coming to the urgent care center every month or 2.  I explained that this fragments her medical care, and is not good for her overall health and management.  I told her I will give her only enough pills to last until her next visit and that thereafter she needs to ask her primary care doctor for her blood pressure medications.  She is feeling well today.  No complaints. History reviewed. No pertinent past medical history.  Patient Active Problem List   Diagnosis Date Noted  . Benign essential HTN   . Hypernatremia   . Dysphagia due to recent stroke   . Spastic hemiparesis ([place])   . S/P percutaneous endoscopic gastrostomy (PEG) tube placement ([facility])   . Labile blood glucose   . Sinus tachycardia   . Intraparenchymal hematoma of brain ([facility]) 09/03/2017  . Right hemiparesis ([place])   . Oropharyngeal dysphagia   . Essential hypertension   . Acute lower UTI   . Tachypnea   . Tachycardia   . Reactive hypertension   . Prediabetes   . Acute blood loss anemia   . Hypokalemia   . Fever   . Leukocytosis   . Acute respiratory failure with hypoxia ([place])   . Cerebrovascular accident (CVA) due to thrombosis of left posterior cerebral artery ([place] [place])   . ICH (intracerebral hemorrhage) ([facility]) 08/14/2017  . SAH (subarachnoid hemorrhage) ([facility]) 08/14/2017  . 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 Hydrocephalus ([facility]) 08/14/2017  . 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 Cytotoxic brain edema ([place]) 08/14/2017    Past Surgical History:  Procedure Laterality Date  . CESAREAN SECTION    . 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 IR GASTROSTOMY TUBE MOD SED  09/16/2017  . IR GASTROSTOMY TUBE REMOVAL  12/06/2017  . LAPAROSCOPIC REVISION VENTRICULAR-PERITONEAL (V-P) SHUNT N/A 08/29/2017   Procedure: LAPAROSCOPIC REVISION VENTRICULAR-PERITONEAL (V-P) SHUNT;  Surgeon: Coralie Keens, MD;  Location: [facility];  Service: General;  Laterality: N/A;  LAPAROSCOPIC REVISION VENTRICULAR-PERITONEAL (V-P) SHUNT  . VENTRICULOPERITONEAL SHUNT Right 08/29/2017   Procedure: SHUNT INSERTION VENTRICULAR-PERITONEAL;  Surgeon: Consuella Lose, MD;  Location: [facility];  Service: Neurosurgery;  Laterality: Right;  SHUNT INSERTION VENTRICULAR-PERITONEAL    OB History   None      Home Medications    Prior to Admission medications   Medication Sig Start Date End Date Taking? Authorizing Provider  aspirin 81 MG chewable tablet Chew 1 tablet (81 mg total) by mouth daily. 09/04/17   Costella, Vista Mink, PA-C  blood glucose meter kit and supplies KIT Dispense based on patient and insurance preference. Use up to four times daily  as directed. (FOR ICD-9 250.00, 250.01). 10/08/17   Azzie Glatter, FNP  metoprolol tartrate (LOPRESSOR) 100 MG tablet Take one tablet twice daily. 01/14/18   Raylene Everts, MD    Family History Family History  Problem Relation Age of Onset  . Hypertension Mother     Social History Social History   Tobacco Use  . Smoking status: Never Smoker  . Smokeless tobacco: Never Used  Substance Use Topics  . Alcohol use: No  . Drug use: No      Allergies   Patient has no known allergies.   Review of Systems Review of Systems  Constitutional: Negative for chills and fever.  HENT: Negative for ear pain and sore throat.   Eyes: Negative for pain and visual disturbance.  Respiratory: Negative for cough and shortness of breath.   Cardiovascular: Negative for chest pain and palpitations.  Gastrointestinal: Negative for abdominal pain and vomiting.  Genitourinary: Negative for dysuria and hematuria.  Musculoskeletal: Negative for arthralgias and back pain.  Skin: Negative for color change and rash.  Neurological: Negative for seizures and syncope.  All other systems reviewed and are negative.  No change in review of systems, chronic weakness, chronic facial asymmetry, chronic gait disturbance  Physical Exam Triage Vital Signs ED Triage Vitals  Enc Vitals Group     BP 01/14/18 1224 (!) 174/93     Pulse Rate 01/14/18 1224 82     Resp --      Temp 01/14/18 1224 97.6 F (36.4 C)     Temp Source 01/14/18 1224 Oral     SpO2 01/14/18 1224 100 %   No data found.  Updated Vital Signs BP (!) 174/93 (BP Location: Left Arm)   Pulse 82   Temp 97.6 F (36.4 C) (Oral)   SpO2 100%        Physical Exam  Constitutional: She appears well-developed and well-nourished. No distress.  Seen in wheelchair.  Mild face asymmetry/droop.  Mild dysarthria  HENT:  Head: Normocephalic and atraumatic.  Mouth/Throat: Oropharynx is clear and moist.  Eyes: Pupils are equal, round, and reactive to light. Conjunctivae are normal.  Neck: Normal range of motion.  Cardiovascular: Normal rate, regular rhythm and normal heart sounds.  Pulmonary/Chest: Effort normal and breath sounds normal. No respiratory distress.  Abdominal: Soft. She exhibits no distension.  Musculoskeletal: Normal range of motion. She exhibits no edema.  Neurological: She is alert.  Skin: Skin is warm and dry.     UC Treatments / Results  Labs (all labs ordered are  listed, but only abnormal results are displayed) Labs Reviewed - No data to display  EKG None  Radiology No results found.  Procedures Procedures (including critical care time)  Medications Ordered in UC Medications - No data to display  Initial Impression / Assessment and Plan / UC Course  I have reviewed the triage vital signs and the nursing notes.  Pertinent labs & imaging results that were available during my care of the patient were reviewed by me and considered in my medical decision making (see chart for details).     Final Clinical Impressions(s) / UC Diagnoses   Final diagnoses:  Medication refill  Essential hypertension     Discharge Instructions     I will refill your medicine ONLY until you see your PCP It is better to get BP medicine from your PCP and NOT the urgent care center CALL your PCP when you get low on medicine   ED Prescriptions  Medication Sig Dispense Auth. Provider   metoprolol tartrate (LOPRESSOR) 100 MG tablet Take one tablet twice daily. 40 tablet Raylene Everts, MD     Controlled Substance Prescriptions [place] Controlled Substance Registry consulted? Not Applicable   Raylene Everts, MD 01/14/18 2129

## 2018-02-04 ENCOUNTER — Ambulatory Visit: Payer: Self-pay | Admitting: Family Medicine

## 2018-02-05 ENCOUNTER — Ambulatory Visit (HOSPITAL_COMMUNITY)
Admission: EM | Admit: 2018-02-05 | Discharge: 2018-02-05 | Disposition: A | Discharge status: Home / Self Care | Payer: Self-pay | Attending: Family Medicine | Admitting: Family Medicine

## 2018-02-05 ENCOUNTER — Encounter (HOSPITAL_COMMUNITY): Payer: Self-pay | Admitting: Emergency Medicine

## 2018-02-05 DIAGNOSIS — I10 Essential (primary) hypertension: Secondary | ICD-10-CM

## 2018-02-05 DIAGNOSIS — Z760 Encounter for issue of repeat prescription: Secondary | ICD-10-CM

## 2018-02-05 MED ORDER — METOPROLOL TARTRATE 100 MG PO TABS: ORAL_TABLET | ORAL | 0 refills | Status: DC

## 2018-02-05 NOTE — ED Triage Notes (Signed)
Pt here for refill of htn meds

## 2018-02-05 NOTE — Discharge Instructions (Signed)
I have refilled your medicine. Please set up care with a Primary Care for further management.   Your blood pressure was elevated today in clinic. Please be sure to take blood pressure medications as prescribed. Please monitor your blood pressure at home or when you go to a [facility]. Please follow up with your primary care doctor to recheck blood pressure and discuss any need for medication changes.   Please go to Emergency Room if you start to experience severe headache, vision changes, decreased urine production, chest pain, shortness of breath, speech slurring, one sided weakness.

## 2018-02-06 NOTE — ED Provider Notes (Signed)
Carla Bauer    CSN: [id number] Arrival date & time: 02/05/18  1231     History   Chief Complaint Chief Complaint  Patient presents with  . Medication Refill    HPI Carla Bauer is a 62 y.o. female history of hypertension, recent stroke, presenting today for refill of blood pressure medicines.  Patient states that she took her last dose of her blood pressure medication this morning.  States that she does not take blood pressure measurements at home.  She denies headaches, vision changes, chest pain or shortness of breath.  Patient states that she does not have a primary care-she has been following up since her recent stroke with internal medicine..  States that she has difficulty with transportation.  She has been taking metoprolol 100 mg twice daily.  Has been seen here multiple times over the past few months for similar medication refill.  Each time it has been discussed with her to obtain a primary care.  HPI  History reviewed. No pertinent past medical history.  Patient Active Problem List   Diagnosis Date Noted  . Benign essential HTN   . Hypernatremia   . Dysphagia due to recent stroke   . Spastic hemiparesis ([place])   . S/P percutaneous endoscopic gastrostomy (PEG) tube placement ([place])   . Labile blood glucose   . Sinus tachycardia   . Intraparenchymal hematoma of brain ([place]) 09/03/2017  . Right hemiparesis ([place])   . Oropharyngeal dysphagia   . Essential hypertension   . Acute lower UTI   . Tachypnea   . Tachycardia   . Reactive hypertension   . Prediabetes   . Acute blood loss anemia   . Hypokalemia   . Fever   . Leukocytosis   . Acute respiratory failure with hypoxia ([place])   . Cerebrovascular accident (CVA) due to thrombosis of left posterior cerebral artery ([place])   . ICH (intracerebral hemorrhage) ([place]) 08/14/2017  . SAH (subarachnoid hemorrhage) ([place]) 08/14/2017  . 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 Hydrocephalus ([place]) 08/14/2017  . 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 Cytotoxic brain edema ([place]) 08/14/2017    Past Surgical History:  Procedure Laterality Date  . CESAREAN SECTION    . CRANIOTOMY Left 08/14/2017   Procedure: 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;  Surgeon: Consuella Lose, MD;  Location: [facility];  Service: Neurosurgery;  Laterality: Left;  . IR GASTROSTOMY TUBE MOD SED  09/16/2017  . IR GASTROSTOMY TUBE REMOVAL  12/06/2017  . LAPAROSCOPIC REVISION VENTRICULAR-PERITONEAL (V-P) SHUNT N/A 08/29/2017   Procedure: LAPAROSCOPIC REVISION VENTRICULAR-PERITONEAL (V-P) SHUNT;  Surgeon: Coralie Keens, MD;  Location: [facility];  Service: General;  Laterality: N/A;  LAPAROSCOPIC REVISION VENTRICULAR-PERITONEAL (V-P) SHUNT  . VENTRICULOPERITONEAL SHUNT Right 08/29/2017   Procedure: SHUNT INSERTION VENTRICULAR-PERITONEAL;  Surgeon: Consuella Lose, MD;  Location: [facility];  Service: Neurosurgery;  Laterality: Right;  SHUNT INSERTION VENTRICULAR-PERITONEAL    OB History   None      Home Medications    Prior to Admission medications   Medication Sig Start Date End Date Taking? Authorizing Provider  aspirin 81 MG chewable tablet Chew 1 tablet (81 mg total) by mouth daily. 09/04/17   Costella, Vista Mink, PA-C  blood glucose meter kit and supplies KIT Dispense based on patient and insurance preference. Use up to four times daily as directed. (FOR ICD-9 250.00, 250.01). 10/08/17   Azzie Glatter, FNP  metoprolol tartrate (LOPRESSOR) 100 MG tablet Take  one tablet twice daily. 02/05/18   Laloni Rowton, Elesa Hacker, PA-C    Family History Family History  Problem Relation Age of Onset  . Hypertension Mother     Social History Social History   Tobacco Use  . Smoking status: Never Smoker  . Smokeless tobacco: Never Used  Substance Use Topics  . Alcohol use: No  . Drug use: No     Allergies   Patient has no known allergies.   Review of  Systems Review of Systems  Constitutional: Negative for fatigue and fever.  HENT: Negative for congestion, sinus pressure and sore throat.   Eyes: Negative for photophobia, pain and visual disturbance.  Respiratory: Negative for cough and shortness of breath.   Cardiovascular: Negative for chest pain.  Gastrointestinal: Negative for abdominal pain, nausea and vomiting.  Genitourinary: Negative for decreased urine volume and hematuria.  Musculoskeletal: Negative for myalgias, neck pain and neck stiffness.  Neurological: Positive for weakness. Negative for dizziness, syncope, facial asymmetry, speech difficulty, light-headedness, numbness and headaches.     Physical Exam Triage Vital Signs ED Triage Vitals [02/05/18 1315]  Enc Vitals Group     BP (!) 157/85     Pulse Rate 80     Resp 18     Temp 98 F (36.7 C)     Temp Source Oral     SpO2 100 %     Weight      Height      Head Circumference      Peak Flow      Pain Score 0     Pain Loc      Pain Edu?      Excl. in [place]?    No data found.  Updated Vital Signs BP (!) 157/85 (BP Location: Left Arm)   Pulse 80   Temp 98 F (36.7 C) (Oral)   Resp 18   SpO2 100%   Visual Acuity Right Eye Distance:   Left Eye Distance:   Bilateral Distance:    Right Eye Near:   Left Eye Near:    Bilateral Near:     Physical Exam  Constitutional: She appears well-developed and well-nourished. No distress.  Sitting in wheelchair  HENT:  Head: Normocephalic and atraumatic.  Mouth/Throat: Oropharynx is clear and moist.  Eyes: Pupils are equal, round, and reactive to light. Conjunctivae and EOM are normal.  Neck: Neck supple.  Cardiovascular: Normal rate and regular rhythm.  No murmur heard. Pulmonary/Chest: Effort normal and breath sounds normal. No respiratory distress.  Breathing comfortably at rest, CTABL, no wheezing, rales or other adventitious sounds auscultated  Abdominal: Soft. There is no tenderness.  Musculoskeletal: She  exhibits no edema.  Neurological: She is alert.  Skin: Skin is warm and dry.  Psychiatric: She has a normal mood and affect.  Nursing note and vitals reviewed.    UC Treatments / Results  Labs (all labs ordered are listed, but only abnormal results are displayed) Labs Reviewed - No data to display  EKG None  Radiology No results found.  Procedures Procedures (including critical care time)  Medications Ordered in UC Medications - No data to display  Initial Impression / Assessment and Plan / UC Course  I have reviewed the triage vital signs and the nursing notes.  Pertinent labs & imaging results that were available during my care of the patient were reviewed by me and considered in my medical decision making (see chart for details).     We will refill blood  pressure medicine for 1 month.  Discussed establishing care with primary care.  Discussed primary care at [facility].  Discussed importance of having primary care to monitor and manage blood pressure, as well as medical issues.  Advised to monitor blood pressure at home.  Discussed signs and symptoms to monitor to go to emergency room.  Patient appears to be at her baseline since her recent stroke in June.  Advised if she has worsening symptoms from her normal to go to emergency room.Discussed strict return precautions. Patient verbalized understanding and is agreeable with plan.  Final Clinical Impressions(s) / UC Diagnoses   Final diagnoses:  Medication refill  Essential hypertension     Discharge Instructions     I have refilled your medicine. Please set up care with a Primary Care for further management.   Your blood pressure was elevated today in clinic. Please be sure to take blood pressure medications as prescribed. Please monitor your blood pressure at home or when you go to a [facility]. Please follow up with your primary care doctor to recheck blood pressure and discuss any need for medication changes.    Please go to Emergency Room if you start to experience severe headache, vision changes, decreased urine production, chest pain, shortness of breath, speech slurring, one sided weakness.   ED Prescriptions    Medication Sig Dispense Auth. Provider   metoprolol tartrate (LOPRESSOR) 100 MG tablet  (Status: Discontinued) Take one tablet twice daily. 60 tablet Donnalynn Wheeless C, PA-C   metoprolol tartrate (LOPRESSOR) 100 MG tablet Take one tablet twice daily. 60 tablet Wave Calzada, [place] C, PA-C     Controlled Substance Prescriptions [place] Controlled Substance Registry consulted? Not Applicable   Janith Lima, [place] 02/06/18 3074

## 2018-02-19 ENCOUNTER — Ambulatory Visit (INDEPENDENT_AMBULATORY_CARE_PROVIDER_SITE_OTHER): Payer: Self-pay | Admitting: Family Medicine

## 2018-02-19 ENCOUNTER — Encounter: Payer: Self-pay | Admitting: Family Medicine

## 2018-02-19 VITALS — BP 155/98 | HR 80 | Temp 98.0°F | Ht 61.0 in | Wt 137.2 lb

## 2018-02-19 DIAGNOSIS — R8290 Unspecified abnormal findings in urine: Secondary | ICD-10-CM

## 2018-02-19 DIAGNOSIS — I10 Essential (primary) hypertension: Secondary | ICD-10-CM

## 2018-02-19 DIAGNOSIS — Z8673 Personal history of transient ischemic attack (TIA), and cerebral infarction without residual deficits: Secondary | ICD-10-CM

## 2018-02-19 DIAGNOSIS — R531 Weakness: Secondary | ICD-10-CM

## 2018-02-19 DIAGNOSIS — Z09 Encounter for follow-up examination after completed treatment for conditions other than malignant neoplasm: Secondary | ICD-10-CM

## 2018-02-19 DIAGNOSIS — Z131 Encounter for screening for diabetes mellitus: Secondary | ICD-10-CM

## 2018-02-19 LAB — POCT URINALYSIS DIP (MANUAL ENTRY)
Bilirubin, UA: NEGATIVE
Glucose, UA: NEGATIVE mg/dL
Ketones, POC UA: NEGATIVE mg/dL
Nitrite, UA: NEGATIVE
Protein Ur, POC: NEGATIVE mg/dL
Spec Grav, UA: 1.015 (ref 1.010–1.025)
Urobilinogen, UA: 0.2 E.U./dL
pH, UA: 6 (ref 5.0–8.0)

## 2018-02-19 LAB — POCT GLYCOSYLATED HEMOGLOBIN (HGB A1C): Hemoglobin A1C: 5.6 % (ref 4.0–5.6)

## 2018-02-19 MED ORDER — CLONIDINE HCL 0.1 MG PO TABS
0.1000 mg | ORAL_TABLET | Freq: Once | ORAL | Status: AC
  Administered 2018-02-19: 0.1 mg via ORAL

## 2018-02-19 MED ORDER — AMLODIPINE BESYLATE 5 MG PO TABS: 5.0000 mg | ORAL_TABLET | Freq: Every day | ORAL | 3 refills | Status: DC

## 2018-02-19 NOTE — Patient Instructions (Addendum)
Amlodipine tablets °What is this medicine? °AMLODIPINE (am LOE di peen) is a calcium-channel blocker. It affects the amount of calcium found in your heart and muscle cells. This relaxes your blood vessels, which can reduce the amount of work the heart has to do. This medicine is used to lower high blood pressure. It is also used to prevent chest pain. °This medicine may be used for other purposes; ask your health care provider or pharmacist if you have questions. °COMMON BRAND NAME(S): Norvasc °What should I tell my health care provider before I take this medicine? °They need to know if you have any of these conditions: °-heart problems like heart failure or aortic stenosis °-liver disease °-an unusual or allergic reaction to amlodipine, other medicines, foods, dyes, or preservatives °-pregnant or trying to get pregnant °-breast-feeding °How should I use this medicine? °Take this medicine by mouth with a glass of water. Follow the directions on the prescription label. Take your medicine at regular intervals. Do not take more medicine than directed. °Talk to your pediatrician regarding the use of this medicine in children. Special care may be needed. This medicine has been used in children as young as 6. °Persons over 65 years old may have a stronger reaction to this medicine and need smaller doses. °Overdosage: If you think you have taken too much of this medicine contact a poison control center or emergency room at once. °NOTE: This medicine is only for you. Do not share this medicine with others. °What if I miss a dose? °If you miss a dose, take it as soon as you can. If it is almost time for your next dose, take only that dose. Do not take double or extra doses. °What may interact with this medicine? °-herbal or dietary supplements °-local or general anesthetics °-medicines for high blood pressure °-medicines for prostate problems °-rifampin °This list may not describe all possible interactions. Give your health  care provider a list of all the medicines, herbs, non-prescription drugs, or dietary supplements you use. Also tell them if you smoke, drink alcohol, or use illegal drugs. Some items may interact with your medicine. °What should I watch for while using this medicine? °Visit your doctor or health care professional for regular check ups. Check your blood pressure and pulse rate regularly. Ask your health care professional what your blood pressure and pulse rate should be, and when you should contact him or her. °This medicine may make you feel confused, dizzy or lightheaded. Do not drive, use machinery, or do anything that needs mental alertness until you know how this medicine affects you. To reduce the risk of dizzy or fainting spells, do not sit or stand up quickly, especially if you are an older patient. Avoid alcoholic drinks; they can make you more dizzy. °Do not suddenly stop taking amlodipine. Ask your doctor or health care professional how you can gradually reduce the dose. °What side effects may I notice from receiving this medicine? °Side effects that you should report to your doctor or health care professional as soon as possible: °-allergic reactions like skin rash, itching or hives, swelling of the face, lips, or tongue °-breathing problems °-changes in vision or hearing °-chest pain °-fast, irregular heartbeat °-swelling of legs or ankles °Side effects that usually do not require medical attention (report to your doctor or health care professional if they continue or are bothersome): °-dry mouth °-facial flushing °-nausea, vomiting °-stomach gas, pain °-tired, weak °-trouble sleeping °This list may not describe all possible side   effects. Call your doctor for medical advice about side effects. You may report side effects to FDA at 1-800-FDA-1088. °Where should I keep my medicine? °Keep out of the reach of children. °Store at room temperature between 59 and 86 degrees F (15 and 30 degrees C). Protect from  light. Keep container tightly closed. Throw away any unused medicine after the expiration date. °NOTE: This sheet is a summary. It may not cover all possible information. If you have questions about this medicine, talk to your doctor, pharmacist, or health care provider. °© 2018 Elsevier/Gold Standard (2012-02-15 11:40:58) ° °

## 2018-02-19 NOTE — Progress Notes (Signed)
Follow Up  Subjective:    Patient ID: Carla Bauer, female    DOB: January 22, 1956, 62 y.o.   MRN: [id number]  No chief complaint on file.  HPI  Carla Bauer is a 84 female with a past medical history of Stroke, Insomnia, and Right Sided Weakness She is here today for hospital follow up.   Current Status: Since her last office visit, she is doing well with no complaints. She has had several ED and Urgent Care visits. She missed her last follow up appointment with our office. She was not aware to keep follow up appointments with our office. He blood pressure is elevated today. She denies visual changes, chest pain, cough, shortness of breath, heart palpitations, and falls. She has occasionally headaches and dizziness with position changes. Denies severe headaches, confusion, seizures, double vision, and blurred vision, nausea and vomiting.  She denies fevers, chills, recent infections, weight loss, and night sweats.  No reports of GI problems such as nausea, vomiting, diarrhea, and constipation. She has no reports of blood in stools, dysuria and hematuria. No depression or anxiety reported. She denies pain today.   Past Medical History:  Diagnosis Date  . Hypertension   . Insomnia   . Right sided weakness   . Stroke (cerebrum) [facility])     Family History  Problem Relation Age of Onset  . Hypertension Mother     Social History   Socioeconomic History  . Marital status: Single    Spouse name: Not on file  . Number of children: Not on file  . Years of education: Not on file  . Highest education level: Not on file  Occupational History  . Not on file  Social Needs  . Financial resource strain: Not on file  . Food insecurity:    Worry: Not on file    Inability: Not on file  . Transportation needs:    Medical: Not on file    Non-medical: Not on file  Tobacco Use  . Smoking status: Never Smoker  . Smokeless tobacco: Never Used  Substance and Sexual Activity  . Alcohol use: No  .  Drug use: No  . Sexual activity: Not on file  Lifestyle  . Physical activity:    Days per week: Not on file    Minutes per session: Not on file  . Stress: Not on file  Relationships  . Social connections:    Talks on phone: Not on file    Gets together: Not on file    Attends religious service: Not on file    Active member of club or organization: Not on file    Attends meetings of clubs or organizations: Not on file    Relationship status: Not on file  . Intimate partner violence:    Fear of current or ex partner: Not on file    Emotionally abused: Not on file    Physically abused: Not on file    Forced sexual activity: Not on file  Other Topics Concern  . Not on file  Social History Narrative  . Not on file    Past Surgical History:  Procedure Laterality Date  . CESAREAN SECTION    . CRANIOTOMY Left 08/14/2017   Procedure: 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;  Surgeon: Consuella Lose, MD;  Location: [facility];  Service: Neurosurgery;  Laterality: Left;  . IR GASTROSTOMY TUBE MOD SED  09/16/2017  . IR GASTROSTOMY TUBE REMOVAL  12/06/2017  . LAPAROSCOPIC REVISION VENTRICULAR-PERITONEAL (V-P)  SHUNT N/A 08/29/2017   Procedure: LAPAROSCOPIC REVISION VENTRICULAR-PERITONEAL (V-P) SHUNT;  Surgeon: Coralie Keens, MD;  Location: [facility];  Service: General;  Laterality: N/A;  LAPAROSCOPIC REVISION VENTRICULAR-PERITONEAL (V-P) SHUNT  . VENTRICULOPERITONEAL SHUNT Right 08/29/2017   Procedure: SHUNT INSERTION VENTRICULAR-PERITONEAL;  Surgeon: Consuella Lose, MD;  Location: [facility];  Service: Neurosurgery;  Laterality: Right;  SHUNT INSERTION VENTRICULAR-PERITONEAL     There is no immunization history on file for this patient.  Current Meds  Medication Sig  . aspirin 81 MG chewable tablet Chew 1 tablet (81 mg total) by mouth daily.  . blood glucose meter kit and supplies KIT Dispense based on patient and insurance preference. Use up to four times daily as  directed. (FOR ICD-9 250.00, 250.01).  . metoprolol tartrate (LOPRESSOR) 100 MG tablet Take one tablet twice daily.    No Known Allergies  BP (!) 155/98   Pulse 80   Temp 98 F (36.7 C) (Oral)   Ht 5' 1"  (1.549 m)   Wt 137 lb 3.2 oz (62.2 kg)   SpO2 100%   BMI 25.92 kg/m    Review of Systems  Constitutional: Negative.   HENT: Negative.   Eyes: Negative.   Respiratory: Negative.   Cardiovascular: Negative.   Gastrointestinal: Negative.   Endocrine: Negative.   Genitourinary: Negative.   Musculoskeletal: Negative.   Skin: Negative.   Neurological: Positive for dizziness, weakness (Residual right sided r/t recent stroke) and headaches.  Psychiatric/Behavioral: Negative.        Objective:   Physical Exam  Constitutional: She is oriented to person, place, and time. She appears well-developed and well-nourished.  HENT:  Head: Normocephalic and atraumatic.  Eyes: Pupils are equal, round, and reactive to light. Conjunctivae and EOM are normal.  Neck: Normal range of motion. Neck supple.  Cardiovascular: Normal rate, regular rhythm, normal heart sounds and intact distal pulses.  Pulmonary/Chest: Effort normal and breath sounds normal.  Abdominal: Soft. Bowel sounds are normal.  Musculoskeletal: Normal range of motion.  Neurological: She is alert and oriented to person, place, and time.  Skin: Skin is warm.  Psychiatric: She has a normal mood and affect. Her behavior is normal. Judgment and thought content normal.  Nursing note and vitals reviewed.  Assessment & Plan:   1. 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  2. Essential hypertension She received Clonidine 0.61m today in office. We will initiate Amlodipine today. She will continue Metoprolol as prescribed. She will continue to decrease high sodium intake, excessive alcohol intake, increase potassium intake, smoking cessation, and increase physical activity of at least 30 minutes of cardio activity daily. She will  continue to follow Heart Healthy or DASH diet. - cloNIDine (CATAPRES) tablet 0.1 mg - amLODipine (NORVASC) 5 MG tablet; Take 1 tablet (5 mg total) by mouth daily.  Dispense: 30 tablet; Refill: 3  3. Right sided weakness Stable.   4. Screening for diabetes mellitus Hgb A1c is within normal range of 5.6 today.  She will continue to decrease foods/beverages high in sugars and carbs and follow Heart Healthy or DASH diet. Increase physical activity to at least 30 minutes cardio exercise daily.   - POCT glycosylated hemoglobin (Hb A1C)  5. Abnormal urinalysis - Urine Culture  6. Follow up She will follow up in 1 month. - POCT urinalysis dipstick  Meds ordered this encounter  Medications  . cloNIDine (CATAPRES) tablet 0.1 mg  . amLODipine (NORVASC) 5 MG tablet    Sig: Take 1 tablet (5 mg total) by mouth  daily.    Dispense:  30 tablet    Refill:  [place],  MSN, FNP-C [facility] [address] [place], [place] [address] [phone]

## 2018-02-21 LAB — URINE CULTURE

## 2018-03-05 ENCOUNTER — Other Ambulatory Visit: Payer: Self-pay | Admitting: Family Medicine

## 2018-03-05 DIAGNOSIS — A498 Other bacterial infections of unspecified site: Secondary | ICD-10-CM

## 2018-03-05 MED ORDER — AMOXICILLIN-POT CLAVULANATE 875-125 MG PO TABS: 1.0000 | ORAL_TABLET | Freq: Two times a day (BID) | ORAL | 0 refills | Status: AC

## 2018-03-05 NOTE — Progress Notes (Signed)
New Rx for Augmentin sent to pharmacy today.  

## 2018-03-06 ENCOUNTER — Telehealth: Payer: Self-pay

## 2018-03-06 NOTE — Telephone Encounter (Signed)
-----   Message from Kallie LocksNatalie M Stroud, FNP sent at 03/05/2018  9:48 PM EST ----- Regarding: "Urine Culture Results" Lyla SonCarrie,   Urine culture identified bacteria. Please inform patient that we have sent new Rx for Augmentin to pharmacy today. This antibiotic is specific to eliminate this bacteria. She is to take medication as directed. She is to complete all medication.  Thank you.

## 2018-03-06 NOTE — Telephone Encounter (Signed)
Patient notified

## 2018-03-21 ENCOUNTER — Ambulatory Visit (INDEPENDENT_AMBULATORY_CARE_PROVIDER_SITE_OTHER): Payer: Self-pay | Admitting: Family Medicine

## 2018-03-21 ENCOUNTER — Encounter: Payer: Self-pay | Admitting: Family Medicine

## 2018-03-21 VITALS — BP 136/84 | HR 94 | Temp 97.8°F | Ht 61.0 in | Wt 140.6 lb

## 2018-03-21 DIAGNOSIS — I10 Essential (primary) hypertension: Secondary | ICD-10-CM

## 2018-03-21 DIAGNOSIS — R8290 Unspecified abnormal findings in urine: Secondary | ICD-10-CM

## 2018-03-21 DIAGNOSIS — Z09 Encounter for follow-up examination after completed treatment for conditions other than malignant neoplasm: Secondary | ICD-10-CM

## 2018-03-21 DIAGNOSIS — R531 Weakness: Secondary | ICD-10-CM

## 2018-03-21 LAB — POCT URINALYSIS DIP (MANUAL ENTRY)
Bilirubin, UA: NEGATIVE
Glucose, UA: NEGATIVE mg/dL
Ketones, POC UA: NEGATIVE mg/dL
Nitrite, UA: NEGATIVE
Protein Ur, POC: NEGATIVE mg/dL
Spec Grav, UA: 1.02 (ref 1.010–1.025)
Urobilinogen, UA: 0.2 E.U./dL
pH, UA: 7 (ref 5.0–8.0)

## 2018-03-21 MED ORDER — METOPROLOL TARTRATE 100 MG PO TABS: ORAL_TABLET | ORAL | 3 refills | Status: DC

## 2018-03-21 MED ORDER — AMLODIPINE BESYLATE 5 MG PO TABS: 5.0000 mg | ORAL_TABLET | Freq: Every day | ORAL | 3 refills | Status: DC

## 2018-03-21 NOTE — Progress Notes (Signed)
Follow Up  Subjective:    Patient ID: Carla Bauer, female    DOB: 09/19/1955, 62 y.o.   MRN: [id number]   Chief Complaint  Patient presents with  . Follow-up    chronic condition   HPI  Carla Bauer is a [id number] female with a past medical history of Stroke, Right Sided Weakness, Insomnia, and Hypertension. She is here today for follow up.   Current Status: Since her last office visit, she is doing well with no complaints. She arrives via wheelchair today and is accompanied by her daughter.    She denies visual changes, chest pain, cough, shortness of breath, heart palpitations, and falls. She has occasionally headaches and dizziness with position changes. Denies severe headaches, confusion, seizures, double vision, and blurred vision, nausea and vomiting.  She denies fevers, chills, fatigue, recent infections, weight loss, and night sweats. No reports of GI problems such as nausea, vomiting, diarrhea, and constipation. She has no reports of blood in stools, dysuria and hematuria. No depression or anxiety reported. She denies pain today.   Review of Systems  Constitutional: Negative.   HENT: Negative.   Eyes: Negative.   Respiratory: Negative.   Cardiovascular: Negative.   Gastrointestinal: Negative.   Endocrine: Negative.   Genitourinary: Negative.   Musculoskeletal: Negative.   Skin: Negative.   Neurological: Positive for dizziness, weakness (Right sided. ) and headaches.  Hematological: Negative.   Psychiatric/Behavioral: Negative.    Objective:   Physical Exam Vitals signs reviewed.  Constitutional:      Appearance: Normal appearance. She is normal weight.  HENT:     Head: Normocephalic and atraumatic.     Right Ear: Tympanic membrane, ear canal and external ear normal.     Left Ear: Tympanic membrane, ear canal and external ear normal.     Nose: Nose normal.     Mouth/Throat:     Mouth: Mucous membranes are moist.     Pharynx: Oropharynx is clear.  Eyes:   Extraocular Movements: Extraocular movements intact.     Conjunctiva/sclera: Conjunctivae normal.     Pupils: Pupils are equal, round, and reactive to light.  Neck:     Musculoskeletal: Normal range of motion and neck supple.  Cardiovascular:     Rate and Rhythm: Normal rate and regular rhythm.     Pulses: Normal pulses.     Heart sounds: Normal heart sounds.  Pulmonary:     Effort: Pulmonary effort is normal.     Breath sounds: Normal breath sounds.  Abdominal:     General: Abdomen is flat. Bowel sounds are normal.     Palpations: Abdomen is soft.  Musculoskeletal: Normal range of motion.  Skin:    General: Skin is warm and dry.  Neurological:     General: No focal deficit present.     Mental Status: She is alert and oriented to person, place, and time.  Psychiatric:        Mood and Affect: Mood normal.        Behavior: Behavior normal.        Thought Content: Thought content normal.        Judgment: Judgment normal.    Assessment & Plan:   1. Essential hypertension Antihypertensive medications are effective. Blood pressure is at 136/84 today. She will continue Norvasc and Metoprolol as prescribed. She will continue to decrease high sodium intake, excessive alcohol intake, increase potassium intake, smoking cessation, and increase physical activity of at least 30 minutes of cardio activity daily. She  will continue to follow Heart Healthy or DASH diet. - POCT urinalysis dipstick - metoprolol tartrate (LOPRESSOR) 100 MG tablet; Take one tablet twice daily.  Dispense: 60 tablet; Refill: 3 - amLODipine (NORVASC) 5 MG tablet; Take 1 tablet (5 mg total) by mouth daily.  Dispense: 30 tablet; Refill: 3  2. Abnormal urine Results are pending.  - Urine Culture  3. Right sided weakness She is s/p: stroke on 09/03/2017/ Stable. She is learning to ambulate more effectively since attending a few of her Physical Therapy sessions. No signs or symptoms of recurrence noted or reported. Monitor.    4. Follow up She will follow up in 3 months.   Meds ordered this encounter  Medications  . metoprolol tartrate (LOPRESSOR) 100 MG tablet    Sig: Take one tablet twice daily.    Dispense:  60 tablet    Refill:  3  . amLODipine (NORVASC) 5 MG tablet    Sig: Take 1 tablet (5 mg total) by mouth daily.    Dispense:  30 tablet    Refill:  3   Raliegh IpNatalie Baily Serpe,  MSN, FNP-C Patient [facility] [address]  [place], [place] [id number] [phone]

## 2018-03-23 LAB — URINE CULTURE

## 2018-06-20 ENCOUNTER — Ambulatory Visit: Payer: Self-pay | Admitting: Family Medicine

## 2018-06-28 ENCOUNTER — Emergency Department (HOSPITAL_COMMUNITY): Payer: Medicaid Other

## 2018-06-28 ENCOUNTER — Emergency Department (HOSPITAL_COMMUNITY)
Admission: EM | Admit: 2018-06-28 | Discharge: 2018-06-28 | Disposition: A | Discharge status: Home / Self Care | Payer: Medicaid Other | Attending: Emergency Medicine | Admitting: Emergency Medicine

## 2018-06-28 ENCOUNTER — Encounter (HOSPITAL_COMMUNITY): Payer: Self-pay | Admitting: Emergency Medicine

## 2018-06-28 DIAGNOSIS — I69391 Dysphagia following cerebral infarction: Secondary | ICD-10-CM | POA: Insufficient documentation

## 2018-06-28 DIAGNOSIS — R4182 Altered mental status, unspecified: Secondary | ICD-10-CM | POA: Insufficient documentation

## 2018-06-28 DIAGNOSIS — I10 Essential (primary) hypertension: Secondary | ICD-10-CM | POA: Diagnosis not present

## 2018-06-28 DIAGNOSIS — Z79899 Other long term (current) drug therapy: Secondary | ICD-10-CM | POA: Diagnosis not present

## 2018-06-28 DIAGNOSIS — R569 Unspecified convulsions: Secondary | ICD-10-CM | POA: Diagnosis not present

## 2018-06-28 DIAGNOSIS — E876 Hypokalemia: Secondary | ICD-10-CM | POA: Insufficient documentation

## 2018-06-28 LAB — CBC WITH DIFFERENTIAL/PLATELET
Abs Immature Granulocytes: 0.03 10*3/uL (ref 0.00–0.07)
Basophils Absolute: 0.1 10*3/uL (ref 0.0–0.1)
Basophils Relative: 1 %
Eosinophils Absolute: 0.1 10*3/uL (ref 0.0–0.5)
Eosinophils Relative: 1 %
HCT: 37.5 % (ref 36.0–46.0)
Hemoglobin: 11.8 g/dL — ABNORMAL LOW (ref 12.0–15.0)
Immature Granulocytes: 0 %
Lymphocytes Relative: 43 %
Lymphs Abs: 4.6 10*3/uL — ABNORMAL HIGH (ref 0.7–4.0)
MCH: 27.5 pg (ref 26.0–34.0)
MCHC: 31.5 g/dL (ref 30.0–36.0)
MCV: 87.4 fL (ref 80.0–100.0)
Monocytes Absolute: 0.5 10*3/uL (ref 0.1–1.0)
Monocytes Relative: 4 %
Neutro Abs: 5.4 10*3/uL (ref 1.7–7.7)
Neutrophils Relative %: 51 %
Platelets: 234 10*3/uL (ref 150–400)
RBC: 4.29 MIL/uL (ref 3.87–5.11)
RDW: 14.6 % (ref 11.5–15.5)
WBC: 10.6 10*3/uL — ABNORMAL HIGH (ref 4.0–10.5)
nRBC: 0 % (ref 0.0–0.2)

## 2018-06-28 LAB — URINALYSIS, ROUTINE W REFLEX MICROSCOPIC
Bacteria, UA: NONE SEEN
Bilirubin Urine: NEGATIVE
Glucose, UA: NEGATIVE mg/dL
Ketones, ur: NEGATIVE mg/dL
LEUKOCYTE UA: NEGATIVE
NITRITE: NEGATIVE
Protein, ur: NEGATIVE mg/dL
Specific Gravity, Urine: 1.01 (ref 1.005–1.030)
pH: 7 (ref 5.0–8.0)

## 2018-06-28 LAB — COMPREHENSIVE METABOLIC PANEL
ALT: 18 U/L (ref 0–44)
ANION GAP: 13 (ref 5–15)
AST: 24 U/L (ref 15–41)
Albumin: 3.4 g/dL — ABNORMAL LOW (ref 3.5–5.0)
Alkaline Phosphatase: 109 U/L (ref 38–126)
BUN: 12 mg/dL (ref 8–23)
CALCIUM: 9.1 mg/dL (ref 8.9–10.3)
CO2: 19 mmol/L — ABNORMAL LOW (ref 22–32)
CREATININE: 0.89 mg/dL (ref 0.44–1.00)
Chloride: 105 mmol/L (ref 98–111)
GFR calc non Af Amer: >60 mL/min (ref >60)
Glucose, Bld: 157 mg/dL — ABNORMAL HIGH (ref 70–99)
Potassium: 2.9 mmol/L — ABNORMAL LOW (ref 3.5–5.1)
Sodium: 137 mmol/L (ref 135–145)
Total Bilirubin: 0.5 mg/dL (ref 0.3–1.2)
Total Protein: 7 g/dL (ref 6.5–8.1)

## 2018-06-28 LAB — POTASSIUM: Potassium: 3.3 mmol/L — ABNORMAL LOW (ref 3.5–5.1)

## 2018-06-28 LAB — RAPID URINE DRUG SCREEN, HOSP PERFORMED
Amphetamines: NOT DETECTED
Barbiturates: NOT DETECTED
Benzodiazepines: NOT DETECTED
Cocaine: NOT DETECTED
Opiates: NOT DETECTED
Tetrahydrocannabinol: NOT DETECTED

## 2018-06-28 LAB — ETHANOL: Alcohol, Ethyl (B): <10 mg/dL (ref <10)

## 2018-06-28 LAB — CBG MONITORING, ED: Glucose-Capillary: 157 mg/dL — ABNORMAL HIGH (ref 70–99)

## 2018-06-28 MED ORDER — LEVETIRACETAM 500 MG PO TABS: 500.0000 mg | ORAL_TABLET | Freq: Two times a day (BID) | ORAL | 0 refills | Status: DC

## 2018-06-28 MED ORDER — LEVETIRACETAM 500 MG PO TABS
1000.0000 mg | ORAL_TABLET | Freq: Once | ORAL | Status: AC
  Administered 2018-06-28: 1000 mg via ORAL
  Filled 2018-06-28: qty 2

## 2018-06-28 NOTE — Discharge Instructions (Signed)
Per [facility] statutes, patients with seizures are not allowed to drive until they have been seizure-free for six months. Use caution when using heavy equipment or power tools. Avoid working on ladders or at heights. Take showers instead of baths. Ensure the water temperature is not too high on the home water heater. Do not go swimming alone. Do not lock yourself in a room alone (i.e. bathroom). When caring for infants or small children, sit down when holding, feeding, or changing them to minimize risk of injury to the child in the event you have a seizure. Maintain good sleep hygiene. Avoid alcohol.   The most likely diagnosis is a seizure Carla Bauer is being started on seizure medication. Please follow up with her neurologist or [facility] who are doing e visits Please follow all seizure precautions- no driving, or doing anything which may harm self or others if a seizure occurs.  NO bathing or swimming alone  If Carla Bauer has another seizure, call 911 and bring them back to the ED if:       A.  The seizure lasts longer than 5 minutes.            B.  The patient doesn't wake shortly after the seizure or has new problems such as difficulty seeing, speaking or moving following the seizure       C.  The patient was injured during the seizure       D.  The patient has a temperature over 102 F (39C)       E.  The patient vomited during the seizure and now is having trouble breathing

## 2018-06-28 NOTE — ED Provider Notes (Signed)
Pt signed out by Dr. Rosalia Hammers.  Pt's K is up to 3.3.  She is awake and alert.  She will be d/c home on keppra and is given seizure precautions.  I spoke with her daughter [name].  All questions answered.  Return if worse.   Jacalyn Lefevre, MD 06/28/18 [phone]

## 2018-06-28 NOTE — ED Provider Notes (Signed)
[place] [facility] EMERGENCY DEPARTMENT Provider Note   CSN: [id number] Arrival date & time: 06/28/18  1255    History   Chief Complaint Chief Complaint  Patient presents with   Altered Mental Status    HPI Carla Bauer is a 63 y.o. female.   Level 5 caveat secondary to altered mental status and patient unable to give history  HPI  63 year old female history of cerebral hemorrhage status post shunt placement transported by EMS with report that she was found by her daughter slumped over in her chair.  EMS reports that she was combative on their arrival.  She has returned to conversational state.  Further history is obtained from chart.  Past Medical History:  Diagnosis Date   Hypertension    Insomnia    Right sided weakness    Stroke (cerebrum) [facility])     Patient Active Problem List   Diagnosis Date Noted   Benign essential HTN    Hypernatremia    Dysphagia due to recent stroke    Spastic hemiparesis (HCC)    S/P percutaneous endoscopic gastrostomy (PEG) tube placement (HCC)    Labile blood glucose    Sinus tachycardia    Intraparenchymal hematoma of brain ([place]) 09/03/2017   Right hemiparesis (HCC)    Oropharyngeal dysphagia    Essential hypertension    Acute lower UTI    Tachypnea    Tachycardia    Reactive hypertension    Prediabetes    Acute blood loss anemia    Hypokalemia    Fever    Leukocytosis    Acute respiratory failure with hypoxia (HCC)    Cerebrovascular accident (CVA) due to thrombosis of left posterior cerebral artery (HCC)    ICH (intracerebral hemorrhage) ([place]) 08/14/2017   SAH (subarachnoid hemorrhage) ([place]) 08/14/2017   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, 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   Hydrocephalus ([place]) 08/14/2017   Brain herniation ([place]) 08/14/2017   Cytotoxic brain edema ([place]) 08/14/2017    Past Surgical History:  Procedure Laterality Date   CESAREAN SECTION      CRANIOTOMY Left 08/14/2017   Procedure: 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;  Surgeon: Consuella Lose, MD;  Location: [facility];  Service: Neurosurgery;  Laterality: Left;   IR GASTROSTOMY TUBE MOD SED  09/16/2017   IR GASTROSTOMY TUBE REMOVAL  12/06/2017   LAPAROSCOPIC REVISION VENTRICULAR-PERITONEAL (V-P) SHUNT N/A 08/29/2017   Procedure: LAPAROSCOPIC REVISION VENTRICULAR-PERITONEAL (V-P) SHUNT;  Surgeon: Coralie Keens, MD;  Location: [facility];  Service: General;  Laterality: N/A;  LAPAROSCOPIC REVISION VENTRICULAR-PERITONEAL (V-P) SHUNT   VENTRICULOPERITONEAL SHUNT Right 08/29/2017   Procedure: SHUNT INSERTION VENTRICULAR-PERITONEAL;  Surgeon: Consuella Lose, MD;  Location: [facility];  Service: Neurosurgery;  Laterality: Right;  SHUNT INSERTION VENTRICULAR-PERITONEAL     OB History   No obstetric history on file.      Home Medications    Prior to Admission medications   Medication Sig Start Date End Date Taking? Authorizing Provider  amLODipine (NORVASC) 5 MG tablet Take 1 tablet (5 mg total) by mouth daily. 03/21/18   Azzie Glatter, FNP  aspirin 81 MG chewable tablet Chew 1 tablet (81 mg total) by mouth daily. 09/04/17   Costella, Vista Mink, PA-C  blood glucose meter kit and supplies KIT Dispense based on patient and insurance preference. Use up to four times daily as directed. (FOR ICD-9 250.00, 250.01). 10/08/17   Azzie Glatter, FNP  metoprolol tartrate (LOPRESSOR) 100 MG tablet Take one tablet twice  daily. 03/21/18   Azzie Glatter, FNP    Family History Family History  Problem Relation Age of Onset   Hypertension Mother     Social History Social History   Tobacco Use   Smoking status: Never Smoker   Smokeless tobacco: Never Used  Substance Use Topics   Alcohol use: No   Drug use: No     Allergies   Patient has no known allergies.   Review of Systems Review of Systems  Unable to perform ROS: Mental status change    All other systems reviewed and are negative.    Physical Exam Updated Vital Signs There were no vitals taken for this visit.  Physical Exam Vitals signs and nursing note reviewed.  Constitutional:      General: She is not in acute distress.    Appearance: Normal appearance. She is not ill-appearing.     Comments: Patient appears unkempt  HENT:     Head: Normocephalic and atraumatic.     Comments: Intraventricular shunt palpable on right side of head with compressible    Right Ear: External ear normal.     Left Ear: External ear normal.     Nose: Nose normal.     Mouth/Throat:     Mouth: Mucous membranes are moist.     Comments: Patient with some dark fluid from mouth and chewing tobacco in mouth Eyes:     Extraocular Movements: Extraocular movements intact.     Pupils: Pupils are equal, round, and reactive to light.  Neck:     Musculoskeletal: Normal range of motion.     Comments: Intraventricular shunt palpable neck Cardiovascular:     Rate and Rhythm: Normal rate and regular rhythm.  Pulmonary:     Effort: Pulmonary effort is normal.     Breath sounds: Normal breath sounds.  Abdominal:     General: Abdomen is flat.  Musculoskeletal: Normal range of motion.  Skin:    General: Skin is warm and dry.     Capillary Refill: Capillary refill takes less than 2 seconds.  Neurological:     Mental Status: She is alert.     Comments: With asymmetrical smile and decreased right nasolabial fold Contractures noted of right upper extremity Patient able to lift right and left lower extremity off bed She is oriented to place but not to year.      ED Treatments / Results  Labs (all labs ordered are listed, but only abnormal results are displayed) Labs Reviewed  CBC WITH DIFFERENTIAL/PLATELET - Abnormal; Notable for the following components:      Result Value   WBC 10.6 (*)    Hemoglobin 11.8 (*)    Lymphs Abs 4.6 (*)    All other components within normal limits   COMPREHENSIVE METABOLIC PANEL - Abnormal; Notable for the following components:   Potassium 2.9 (*)    CO2 19 (*)    Glucose, Bld 157 (*)    Albumin 3.4 (*)    All other components within normal limits  URINALYSIS, ROUTINE W REFLEX MICROSCOPIC - Abnormal; Notable for the following components:   Hgb urine dipstick SMALL (*)    All other components within normal limits  CBG MONITORING, ED - Abnormal; Notable for the following components:   Glucose-Capillary 157 (*)    All other components within normal limits  RAPID URINE DRUG SCREEN, HOSP PERFORMED  ETHANOL  POTASSIUM    EKG None  Radiology Dg Skull 1-3 Views  Result Date: 06/28/2018 CLINICAL DATA:  Ventriculoperitoneal shunt patient. Assess for shunt continuity. EXAM: SKULL - 1-3 VIEW; ABDOMEN - 1 VIEW; CHEST  1 VIEW COMPARISON:  CT head 06/28/2018 shows no hydrocephalus. FINDINGS: AP and lateral views of the skull demonstrate unremarkable appearance following LEFT craniotomy for aneurysm clipping. RIGHT frontal shunt catheter is intact, with satisfactory appearing connection to the programmable valve. Shunt tubing is continuous over the posterior skull and neck into the chest. AP view of the chest demonstrates clear lung fields with normal heart size. Shunt catheter courses along the RIGHT anterior chest without discontinuity. AP view of the abdomen demonstrates unremarkable gas pattern. Shunt tubing is coiled over the RIGHT abdomen, with the tip pointing superiorly into the RIGHT upper quadrant. IMPRESSION: No discontinuity or other adverse features of the ventriculoperitoneal shunt catheter, RIGHT frontal approach. The tip of the shunt catheter in the abdomen is located in the RIGHT upper quadrant. Electronically Signed   By: Staci Righter M.D.   On: 06/28/2018 15:41   Dg Chest 1 View  Result Date: 06/28/2018 CLINICAL DATA:  Ventriculoperitoneal shunt patient. Assess for shunt continuity. EXAM: SKULL - 1-3 VIEW; ABDOMEN - 1 VIEW;  CHEST  1 VIEW COMPARISON:  CT head 06/28/2018 shows no hydrocephalus. FINDINGS: AP and lateral views of the skull demonstrate unremarkable appearance following LEFT craniotomy for aneurysm clipping. RIGHT frontal shunt catheter is intact, with satisfactory appearing connection to the programmable valve. Shunt tubing is continuous over the posterior skull and neck into the chest. AP view of the chest demonstrates clear lung fields with normal heart size. Shunt catheter courses along the RIGHT anterior chest without discontinuity. AP view of the abdomen demonstrates unremarkable gas pattern. Shunt tubing is coiled over the RIGHT abdomen, with the tip pointing superiorly into the RIGHT upper quadrant. IMPRESSION: No discontinuity or other adverse features of the ventriculoperitoneal shunt catheter, RIGHT frontal approach. The tip of the shunt catheter in the abdomen is located in the RIGHT upper quadrant. Electronically Signed   By: Staci Righter M.D.   On: 06/28/2018 15:41   Dg Abd 1 View  Result Date: 06/28/2018 CLINICAL DATA:  Ventriculoperitoneal shunt patient. Assess for shunt continuity. EXAM: SKULL - 1-3 VIEW; ABDOMEN - 1 VIEW; CHEST  1 VIEW COMPARISON:  CT head 06/28/2018 shows no hydrocephalus. FINDINGS: AP and lateral views of the skull demonstrate unremarkable appearance following LEFT craniotomy for aneurysm clipping. RIGHT frontal shunt catheter is intact, with satisfactory appearing connection to the programmable valve. Shunt tubing is continuous over the posterior skull and neck into the chest. AP view of the chest demonstrates clear lung fields with normal heart size. Shunt catheter courses along the RIGHT anterior chest without discontinuity. AP view of the abdomen demonstrates unremarkable gas pattern. Shunt tubing is coiled over the RIGHT abdomen, with the tip pointing superiorly into the RIGHT upper quadrant. IMPRESSION: No discontinuity or other adverse features of the ventriculoperitoneal  shunt catheter, RIGHT frontal approach. The tip of the shunt catheter in the abdomen is located in the RIGHT upper quadrant. Electronically Signed   By: Staci Righter M.D.   On: 06/28/2018 15:41   Ct Head Wo Contrast  Result Date: 06/28/2018 CLINICAL DATA:  Altered consciousness. Prior VP shunt placement and craniotomy. EXAM: CT HEAD WITHOUT CONTRAST TECHNIQUE: Contiguous axial images were obtained from the base of the skull through the vertex without intravenous contrast. COMPARISON:  08/25/2017 FINDINGS: Brain: Moderate low density in the periventricular white matter likely related to small vessel disease. Remote infarct involving the posterior limb left internal  capsule with extension into the left temporal lobe. Resolved left subdural fluid collection. No mass lesion, hemorrhage, hydrocephalus, acute infarct, intra-axial, or extra-axial fluid collection. Vascular: No hyperdense vessel or unexpected calcification. Skull: No significant soft tissue swelling. Prior left frontal craniotomy and right frontal approach ventriculostomy catheter. Sinuses/Orbits: Normal imaged portions of the orbits and globes. Clear paranasal sinuses and mastoid air cells. Other: None. IMPRESSION: 1. No acute intracranial abnormality. 2. Resolution of left-sided subdural fluid collection. 3. Remote infarct involving the posterior limb left internal capsule and left temporal lobe. 4. Moderate small vessel ischemic change. Electronically Signed   By: Abigail Miyamoto M.D.   On: 06/28/2018 15:33   Dg Chest Port 1 View  Result Date: 06/28/2018 CLINICAL DATA:  Patient found unresponsive. EXAM: PORTABLE CHEST 1 VIEW COMPARISON:  Aug 22, 2017 FINDINGS: A VP shunt is seen over the right side of the neck, chest, and upper abdomen. The heart, hila, mediastinum are normal. No pneumothorax. The lungs are clear. No other acute abnormalities. IMPRESSION: No acute abnormalities. Electronically Signed   By: Dorise Bullion III M.D   On: 06/28/2018  14:14   Radiology studies independently reviewed by me  Procedures Procedures (including critical care time)  Medications Ordered in ED Medications - No data to display   Initial Impression / Assessment and Plan / ED Course  I have reviewed the triage vital signs and the nursing notes.  Pertinent labs & imaging results that were available during my care of the patient were reviewed by me and considered in my medical decision making (see chart for details).       63 year old female prior history of intracerebral hemorrhage presents today with altered mental status with initial severe confusion which has appeared to be clearing.  Presentation most consistent with likely new seizure with loss of bladder control noted.  Work-up here significant for hypokalemia.  We will recheck this.  Head CT and shunt series are pending. Patient has mental status clearing back to presumed baseline Most likely new seizure presenting with non witnessed event then combative and now clearing to baseline.  Work up here negative for other causes such ich, acute electrolyte abnormality, infection. Patient with hypokalemia and will recheck before d/c Repeat potassium pending. Discussed with Dr. Gilford Raid and she will dispo after potassium  Discussed with neuro hospitalist, Dr. Malen Gauze, and will start Keppra 500 twice daily and plan follow-up (neurology.  1 g of Keppra is given here in ED Final Clinical Impressions(s) / ED Diagnoses   Final diagnoses:  Altered mental status, unspecified altered mental status type  Seizure [facility])    ED Discharge Orders    None       Pattricia Boss, MD 06/28/18 [phone]

## 2018-06-28 NOTE — ED Triage Notes (Signed)
Pt to ER found at home unresponsive in wheelchair "slumped over," found by daughter, who lives with her. Reportedly combative on EMS arrival, no history of seizures. Pt is alert on arrival and answers questions appropriately. Incontinent episode noted on clothing.

## 2018-07-01 ENCOUNTER — Ambulatory Visit: Payer: Medicaid Other | Admitting: Family Medicine

## 2018-07-02 ENCOUNTER — Ambulatory Visit (INDEPENDENT_AMBULATORY_CARE_PROVIDER_SITE_OTHER): Payer: Medicaid Other | Admitting: Family Medicine

## 2018-07-02 ENCOUNTER — Encounter: Payer: Self-pay | Admitting: Family Medicine

## 2018-07-02 ENCOUNTER — Other Ambulatory Visit: Payer: Self-pay

## 2018-07-02 DIAGNOSIS — R569 Unspecified convulsions: Secondary | ICD-10-CM

## 2018-07-02 DIAGNOSIS — Z09 Encounter for follow-up examination after completed treatment for conditions other than malignant neoplasm: Secondary | ICD-10-CM

## 2018-07-02 DIAGNOSIS — Z8673 Personal history of transient ischemic attack (TIA), and cerebral infarction without residual deficits: Secondary | ICD-10-CM

## 2018-07-02 DIAGNOSIS — R531 Weakness: Secondary | ICD-10-CM | POA: Diagnosis not present

## 2018-07-02 DIAGNOSIS — I10 Essential (primary) hypertension: Secondary | ICD-10-CM | POA: Diagnosis not present
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
## 2018-08-08 ENCOUNTER — Other Ambulatory Visit: Payer: Self-pay

## 2018-08-08 ENCOUNTER — Ambulatory Visit (INDEPENDENT_AMBULATORY_CARE_PROVIDER_SITE_OTHER): Payer: Medicaid Other | Admitting: Family Medicine

## 2018-08-08 DIAGNOSIS — I10 Essential (primary) hypertension: Secondary | ICD-10-CM

## 2018-08-08 DIAGNOSIS — Z09 Encounter for follow-up examination after completed treatment for conditions other than malignant neoplasm: Secondary | ICD-10-CM | POA: Diagnosis not present

## 2018-08-08 DIAGNOSIS — Z8673 Personal history of transient ischemic attack (TIA), and cerebral infarction without residual deficits: Secondary | ICD-10-CM | POA: Diagnosis not present

## 2018-08-08 DIAGNOSIS — R531 Weakness: Secondary | ICD-10-CM | POA: Diagnosis not present

## 2018-08-08 MED ORDER — METOPROLOL TARTRATE 100 MG PO TABS: ORAL_TABLET | ORAL | 3 refills | Status: DC

## 2018-08-08 MED ORDER — AMLODIPINE BESYLATE 5 MG PO TABS: 5.0000 mg | ORAL_TABLET | Freq: Every day | ORAL | 3 refills | Status: DC

## 2018-08-08 NOTE — Progress Notes (Signed)
Virtual Visit via Telephone Note  I connected with Carla Bauer on 08/08/18 at 11:00 AM EDT by telephone and verified that I am speaking with the correct person using two identifiers.   I discussed the limitations, risks, security and privacy concerns of performing an evaluation and management service by telephone and the availability of in person appointments. I also discussed with the patient that there may be a patient responsible charge related to this service. The patient expressed understanding and agreed to proceed.   History of Present Illness:  Past Medical History:  Diagnosis Date  . Altered mental status   . Hypertension   . Insomnia   . Right sided weakness   . Seizure-like activity (HCC)   . Stroke (cerebrum) [facility])    Current Outpatient Medications on File Prior to Visit  Medication Sig Dispense Refill  . aspirin 81 MG chewable tablet Chew 1 tablet (81 mg total) by mouth daily. 30 tablet 0   No current facility-administered medications on file prior to visit.     Current Status: Since her last office visit, she is doing well with no complaints. She has discontinued Keppra and Flomax, which she states she does not need. She has not had any 'suspected' seizure activity since her last ED visit for Altered Mental Status on 06/28/2018. She denies visual changes, chest pain, cough, shortness of breath, heart palpitations, and falls. She has occasional headaches and dizziness with position changes. Denies severe headaches, confusion, seizures, double vision, and blurred vision, nausea and vomiting.  She denies fevers, chills, fatigue, recent infections, weight loss, and night sweats. No reports of GI problems such as diarrhea, and constipation. She has no reports of blood in stools, dysuria and hematuria. No depression or anxiety reported. She denies pain today.    Observations/Objective:  Telephone Virtual Visit   Assessment and Plan:  1. 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   2. Right sided weakness Mild. Not worsening.   3. Essential hypertension She will continue to decrease high sodium intake, excessive alcohol intake, increase potassium intake, smoking cessation, and increase physical activity of at least 30 minutes of cardio activity daily. She will continue to follow Heart Healthy or DASH diet. - metoprolol tartrate (LOPRESSOR) 100 MG tablet; Take one tablet twice daily.  Dispense: 60 tablet; Refill: 3 - amLODipine (NORVASC) 5 MG tablet; Take 1 tablet (5 mg total) by mouth daily.  Dispense: 30 tablet; Refill: 3  Meds ordered this encounter  Medications  . metoprolol tartrate (LOPRESSOR) 100 MG tablet    Sig: Take one tablet twice daily.    Dispense:  60 tablet    Refill:  3  . amLODipine (NORVASC) 5 MG tablet    Sig: Take 1 tablet (5 mg total) by mouth daily.    Dispense:  30 tablet    Refill:  3    No orders of the defined types were placed in this encounter.   Referral Orders  No referral(s) requested today    Raliegh Ip,  MSN, FNP-C Patient Care Center [facility] [address] [place], [place] [address] [phone]     Follow Up Instructions:  She will follow up in 3 months.    I discussed the assessment and treatment plan with the patient. The patient was provided an opportunity to ask questions and all were answered. The patient agreed with the plan and demonstrated an understanding of the instructions.   The patient was advised  to call back or seek an in-person evaluation if the symptoms worsen or if the condition fails to improve as anticipated.  I provided 15 minutes of non-face-to-face time during this encounter.   Kallie LocksNatalie M Rockie Schnoor, FNP

## 2018-11-10 ENCOUNTER — Ambulatory Visit: Payer: Medicaid Other | Admitting: Family Medicine

## 2019-01-16 ENCOUNTER — Ambulatory Visit (INDEPENDENT_AMBULATORY_CARE_PROVIDER_SITE_OTHER): Payer: Medicaid Other | Admitting: Family Medicine

## 2019-01-16 ENCOUNTER — Other Ambulatory Visit: Payer: Self-pay

## 2019-01-16 ENCOUNTER — Encounter: Payer: Self-pay | Admitting: Family Medicine

## 2019-01-16 VITALS — BP 145/83 | HR 98 | Temp 98.0°F | Ht 61.0 in | Wt 156.0 lb

## 2019-01-16 DIAGNOSIS — Z09 Encounter for follow-up examination after completed treatment for conditions other than malignant neoplasm: Secondary | ICD-10-CM

## 2019-01-16 DIAGNOSIS — Z8673 Personal history of transient ischemic attack (TIA), and cerebral infarction without residual deficits: Secondary | ICD-10-CM

## 2019-01-16 DIAGNOSIS — I10 Essential (primary) hypertension: Secondary | ICD-10-CM

## 2019-01-16 DIAGNOSIS — R531 Weakness: Secondary | ICD-10-CM

## 2019-01-16 LAB — POCT URINALYSIS DIPSTICK
Bilirubin, UA: NEGATIVE
Glucose, UA: NEGATIVE
Ketones, UA: NEGATIVE
Leukocytes, UA: NEGATIVE
Nitrite, UA: NEGATIVE
Protein, UA: NEGATIVE
Spec Grav, UA: 1.015 (ref 1.010–1.025)
Urobilinogen, UA: 0.2 E.U./dL
pH, UA: 6 (ref 5.0–8.0)

## 2019-01-16 MED ORDER — METOPROLOL TARTRATE 100 MG PO TABS: ORAL_TABLET | ORAL | 6 refills | Status: DC

## 2019-01-16 MED ORDER — ASPIRIN 81 MG PO CHEW: 81.0000 mg | CHEWABLE_TABLET | Freq: Every day | ORAL | 6 refills | Status: DC

## 2019-01-16 MED ORDER — AMLODIPINE BESYLATE 5 MG PO TABS: 5.0000 mg | ORAL_TABLET | Freq: Every day | ORAL | 6 refills | Status: DC

## 2019-01-16 NOTE — Progress Notes (Signed)
Patient Carla Bauer and [facility]   Established Patient Office Visit  Subjective:  Patient ID: Carla Bauer, female    DOB: 1955-07-01  Age: 63 y.o. MRN: [id number]  CC:  Chief Complaint  Patient presents with  . Follow-up    HTN, Medication management   . Medication Refill    Amlodipine & Metoprolol request a 90day suppply    HPI Carla Bauer is a 63 year old female who presents for Follow Up.   Past Medical History:  Diagnosis Date  . Altered mental status   . Hypertension   . Insomnia   . Right sided weakness   . Seizure-like activity ([place])   . Stroke (cerebrum) [facility])      Current Status: Since her last office visit, she is doing well with no complaints. She arrives today with her sister. She uses a wheelchair. She continues to have residual right-sided weakness r/t her recent history of Stroke. She denies visual changes, chest pain, cough, shortness of breath, heart palpitations, and falls. She has occasional headaches and dizziness with position changes. Denies severe headaches, confusion, seizures, double vision, and blurred vision, nausea and vomiting. She denies fevers, chills, fatigue, recent infections, weight loss, and night sweats. No reports of GI problems such as nausea, vomiting, diarrhea, and constipation. She has no reports of blood in stools, dysuria and hematuria. No depression or anxiety reported today. She denies pain today.   Past Surgical History:  Procedure Laterality Date  . CESAREAN SECTION    . 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 IR GASTROSTOMY TUBE MOD SED  09/16/2017  . IR GASTROSTOMY TUBE REMOVAL  12/06/2017  . LAPAROSCOPIC REVISION VENTRICULAR-PERITONEAL (V-P) SHUNT N/A 08/29/2017   Procedure: LAPAROSCOPIC REVISION VENTRICULAR-PERITONEAL (V-P)  SHUNT;  Surgeon: Coralie Keens, MD;  Location: [place];  Service: General;  Laterality: N/A;  LAPAROSCOPIC REVISION VENTRICULAR-PERITONEAL (V-P) SHUNT  . VENTRICULOPERITONEAL SHUNT Right 08/29/2017   Procedure: SHUNT INSERTION VENTRICULAR-PERITONEAL;  Surgeon: Consuella Lose, MD;  Location: [place];  Service: Neurosurgery;  Laterality: Right;  SHUNT INSERTION VENTRICULAR-PERITONEAL    Family History  Problem Relation Age of Onset  . Hypertension Mother     Social History   Socioeconomic History  . Marital status: Single    Spouse name: Not on file  . Number of children: Not on file  . Years of education: Not on file  . Highest education level: Not on file  Occupational History  . Not on file  Social Needs  . Financial resource strain: Not on file  . Food insecurity    Worry: Not on file    Inability: Not on file  . Transportation needs    Medical: Not on file    Non-medical: Not on file  Tobacco Use  . Smoking status: Never Smoker  . Smokeless tobacco: Never Used  Substance and Sexual Activity  . Alcohol use: No  . Drug use: No  . Sexual activity: Not on file  Lifestyle  . Physical activity    Days per week: Not on file    Minutes per session: Not on file  . Stress: Not on file  Relationships  . Social Herbalist on phone: Not on file    Gets together: Not on file    Attends religious service: Not on file  Active member of club or organization: Not on file    Attends meetings of clubs or organizations: Not on file    Relationship status: Not on file  . Intimate partner violence    Fear of current or ex partner: Not on file    Emotionally abused: Not on file    Physically abused: Not on file    Forced sexual activity: Not on file  Other Topics Concern  . Not on file  Social History Narrative  . Not on file    Outpatient Medications Prior to Visit  Medication Sig Dispense Refill  . amLODipine (NORVASC) 5 MG tablet Take 1 tablet (5 mg total) by  mouth daily. 30 tablet 3  . aspirin 81 MG chewable tablet Chew 1 tablet (81 mg total) by mouth daily. 30 tablet 0  . metoprolol tartrate (LOPRESSOR) 100 MG tablet Take one tablet twice daily. 60 tablet 3   No facility-administered medications prior to visit.     No Known Allergies  ROS Review of Systems  Eyes: Negative.   Respiratory: Negative.   Cardiovascular: Negative.   Gastrointestinal: Negative.   Endocrine: Negative.   Genitourinary: Negative.   Musculoskeletal: Negative.   Skin: Negative.   Allergic/Immunologic: Negative.   Neurological: Positive for weakness (residual right-sided weakness, r/t recent Stroke).  Hematological: Negative.   Psychiatric/Behavioral: Negative.       Objective:    Physical Exam  Constitutional: She is oriented to person, place, and time. She appears well-developed and well-nourished.  HENT:  Head: Normocephalic and atraumatic.  Eyes: Conjunctivae are normal.  Neck: Normal range of motion. Neck supple.  Cardiovascular: Normal rate, regular rhythm, normal heart sounds and intact distal pulses.  Pulmonary/Chest: Effort normal and breath sounds normal.  Abdominal: Soft. Bowel sounds are normal.  Musculoskeletal:     Comments: Limited ROM in right extremity.   Neurological: She is alert and oriented to person, place, and time. She has normal reflexes.  Skin: Skin is warm and dry.  Psychiatric: She has a normal mood and affect. Her behavior is normal. Judgment and thought content normal.  Nursing note and vitals reviewed.   BP (!) 145/83   Pulse 98   Temp 98 F (36.7 C) (Oral)   Ht 5\' 1"  (1.549 m)   Wt 156 lb (70.8 kg)   SpO2 100%   BMI 29.48 kg/m  Wt Readings from Last 3 Encounters:  01/16/19 156 lb (70.8 kg)  03/21/18 140 lb 9.6 oz (63.8 kg)  02/19/18 137 lb 3.2 oz (62.2 kg)     Health Maintenance Due  Topic Date Due  . Hepatitis C Screening  1956/02/06  . PAP SMEAR-Modifier  02/24/1975  . MAMMOGRAM  02/24/2004  .  COLONOSCOPY  02/24/2004  . INFLUENZA VACCINE  11/01/2018    There are no preventive care reminders to display for this patient.   Lab Results  Component Value Date   WBC 10.6 (H) 06/28/2018   HGB 11.8 (L) 06/28/2018   HCT 37.5 06/28/2018   MCV 87.4 06/28/2018   PLT 234 06/28/2018   Lab Results  Component Value Date   NA 137 06/28/2018   K 3.3 (L) 06/28/2018   CO2 19 (L) 06/28/2018   GLUCOSE 157 (H) 06/28/2018   BUN 12 06/28/2018   CREATININE 0.89 06/28/2018   BILITOT 0.5 06/28/2018   ALKPHOS 109 06/28/2018   AST 24 06/28/2018   ALT 18 06/28/2018   PROT 7.0 06/28/2018   ALBUMIN 3.4 (L) 06/28/2018   CALCIUM  9.1 06/28/2018   ANIONGAP 13 06/28/2018   Lab Results  Component Value Date   CHOL 120 08/16/2017   Lab Results  Component Value Date   HDL 53 08/16/2017   Lab Results  Component Value Date   LDLCALC 52 08/16/2017   Lab Results  Component Value Date   TRIG 77 08/16/2017   Lab Results  Component Value Date   CHOLHDL 2.3 08/16/2017   Lab Results  Component Value Date   HGBA1C 5.6 02/19/2018      Assessment & Plan:   1. Essential hypertension The current medical regimen is effective; blood pressure is stable at 145/83 today; continue present plan and medications as prescribed. She will continue to take medications as prescribed, to decrease high sodium intake, excessive alcohol intake, increase potassium intake, smoking cessation, and increase physical activity of at least 30 minutes of cardio activity daily. She will continue to follow Heart Healthy or DASH diet. - POCT urinalysis dipstick - amLODipine (NORVASC) 5 MG tablet; Take 1 tablet (5 mg total) by mouth daily.  Dispense: 30 tablet; Refill: 6 - aspirin 81 MG chewable tablet; Chew 1 tablet (81 mg total) by mouth daily.  Dispense: 30 tablet; Refill: 6 - metoprolol tartrate (LOPRESSOR) 100 MG tablet; Take one tablet twice daily.  Dispense: 60 tablet; Refill: 6  2. S/P stroke due to cerebrovascular  disease Stable. No signs or symptoms of recurrence noted or reported.  - Ambulatory referral to Physical Therapy  3. Right sided weakness - Ambulatory referral to Physical Therapy  4. Follow up She will follow up in 6 months.   Meds ordered this encounter  Medications  . amLODipine (NORVASC) 5 MG tablet    Sig: Take 1 tablet (5 mg total) by mouth daily.    Dispense:  30 tablet    Refill:  6  . aspirin 81 MG chewable tablet    Sig: Chew 1 tablet (81 mg total) by mouth daily.    Dispense:  30 tablet    Refill:  6  . metoprolol tartrate (LOPRESSOR) 100 MG tablet    Sig: Take one tablet twice daily.    Dispense:  60 tablet    Refill:  6    Orders Placed This Encounter  Procedures  . Ambulatory referral to Physical Therapy  . POCT urinalysis dipstick     Referral Orders     Ambulatory referral to Physical Therapy   Raliegh Ip,  MSN, FNP-BC [facility] Patient Care [facility] [address] [place], [place] [address] [phone] fax    Problem List Items Addressed This Visit      Cardiovascular and Mediastinum   Essential hypertension - Primary   Relevant Medications   amLODipine (NORVASC) 5 MG tablet   aspirin 81 MG chewable tablet   metoprolol tartrate (LOPRESSOR) 100 MG tablet   Other Relevant Orders   POCT urinalysis dipstick (Completed)    Other Visit Diagnoses    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       Relevant Orders   Ambulatory referral to Physical Therapy   Right sided weakness       Relevant Orders   Ambulatory referral to Physical Therapy   Follow up          Meds ordered this encounter  Medications  . amLODipine (NORVASC) 5 MG tablet    Sig: Take 1 tablet (5 mg total) by mouth daily.    Dispense:  30 tablet  Refill:  6  . aspirin 81 MG chewable tablet    Sig: Chew 1 tablet (81 mg total) by mouth daily.    Dispense:  30 tablet    Refill:  6  . metoprolol tartrate  (LOPRESSOR) 100 MG tablet    Sig: Take one tablet twice daily.    Dispense:  60 tablet    Refill:  6    Follow-up: No follow-ups on file.    Kallie Locks, FNP

## 2019-02-12 ENCOUNTER — Ambulatory Visit: Payer: Medicaid Other | Attending: Physical Therapy | Admitting: Physical Therapy

## 2019-06-15 ENCOUNTER — Other Ambulatory Visit: Payer: Self-pay

## 2019-06-15 ENCOUNTER — Telehealth: Payer: Self-pay | Admitting: Family Medicine

## 2019-06-15 ENCOUNTER — Other Ambulatory Visit: Payer: Self-pay | Admitting: Family Medicine

## 2019-06-15 DIAGNOSIS — I10 Essential (primary) hypertension: Secondary | ICD-10-CM

## 2019-06-15 MED ORDER — AMLODIPINE BESYLATE 5 MG PO TABS: 5.0000 mg | ORAL_TABLET | Freq: Every day | ORAL | 2 refills | Status: DC

## 2019-06-15 MED ORDER — METOPROLOL TARTRATE 100 MG PO TABS: ORAL_TABLET | ORAL | 2 refills | Status: DC

## 2019-06-15 NOTE — Telephone Encounter (Signed)
Pt wants [place] to call in medicine to pharmacy. Please call pt back.

## 2019-07-17 ENCOUNTER — Ambulatory Visit: Payer: Medicaid Other | Admitting: Family Medicine

## 2019-07-29 ENCOUNTER — Ambulatory Visit: Payer: Medicaid Other | Admitting: Family Medicine

## 2019-08-01 DIAGNOSIS — E559 Vitamin D deficiency, unspecified: Secondary | ICD-10-CM

## 2019-08-01 HISTORY — DX: Vitamin D deficiency, unspecified: E55.9

## 2019-08-11 ENCOUNTER — Ambulatory Visit: Payer: Medicaid Other | Admitting: Family Medicine

## 2019-08-19 ENCOUNTER — Ambulatory Visit (INDEPENDENT_AMBULATORY_CARE_PROVIDER_SITE_OTHER): Payer: Medicare Other | Admitting: Family Medicine

## 2019-08-19 ENCOUNTER — Encounter: Payer: Self-pay | Admitting: Family Medicine

## 2019-08-19 ENCOUNTER — Other Ambulatory Visit: Payer: Self-pay

## 2019-08-19 VITALS — BP 127/88 | HR 89 | Temp 98.7°F | Ht 61.0 in | Wt 158.4 lb

## 2019-08-19 DIAGNOSIS — R531 Weakness: Secondary | ICD-10-CM

## 2019-08-19 DIAGNOSIS — Z09 Encounter for follow-up examination after completed treatment for conditions other than malignant neoplasm: Secondary | ICD-10-CM

## 2019-08-19 DIAGNOSIS — Z0000 Encounter for general adult medical examination without abnormal findings: Secondary | ICD-10-CM

## 2019-08-19 DIAGNOSIS — Z8673 Personal history of transient ischemic attack (TIA), and cerebral infarction without residual deficits: Secondary | ICD-10-CM

## 2019-08-19 DIAGNOSIS — Z7409 Other reduced mobility: Secondary | ICD-10-CM

## 2019-08-19 DIAGNOSIS — I10 Essential (primary) hypertension: Secondary | ICD-10-CM | POA: Diagnosis not present

## 2019-08-19 LAB — POCT GLYCOSYLATED HEMOGLOBIN (HGB A1C): Hemoglobin A1C: 7.4 % — AB (ref 4.0–5.6)

## 2019-08-19 LAB — GLUCOSE, POCT (MANUAL RESULT ENTRY): POC Glucose: 143 mg/dl — AB (ref 70–99)

## 2019-08-19 NOTE — Progress Notes (Signed)
Patient [facility]   Established Patient Office Visit  Subjective:  Patient ID: Carla Bauer, female    DOB: [birth year]  Age: 64 y.o. MRN: [id number]  CC:  Chief Complaint  Patient presents with  . Follow-up    HTN    HPI Carla Bauer is a 64 year old female who presents for Follow Up today.   Past Medical History:  Diagnosis Date  . Altered mental status   . Hypertension   . Insomnia   . Right sided weakness   . Seizure-like activity ([place])   . Stroke (cerebrum) [facility])    Current Status: Since his last office visit, he is doing well with no complaints. She is accompanied by her daughter today. She is using wheelchair for ambulation.  She continues to have right-sided weakness r//t Stroke history. Her daughter reports that she is not ambulating at home much without wheelchair. She denies visual changes, chest pain, cough, shortness of breath, heart palpitations, and falls. She has occasional headaches and dizziness with position changes. Denies severe headaches, confusion, seizures, double vision, and blurred vision, nausea and vomiting. She denies fevers, chills, fatigue, recent infections, weight loss, and night sweats.  Denies GI problems such as diarrhea, and constipation. She has no reports of blood in stools, dysuria and hematuria. No depression or anxiety reported today. She denies suicidal ideations, homicidal ideations, or auditory hallucinations. She is taking all medications as prescribed. She denies pain today.   Past Surgical History:  Procedure Laterality Date  . CESAREAN SECTION    . 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 IR GASTROSTOMY TUBE MOD SED  09/16/2017  . IR GASTROSTOMY TUBE REMOVAL  12/06/2017  . LAPAROSCOPIC REVISION VENTRICULAR-PERITONEAL (V-P)  SHUNT N/A 08/29/2017   Procedure: LAPAROSCOPIC REVISION VENTRICULAR-PERITONEAL (V-P) SHUNT;  Surgeon: Coralie Keens, MD;  Location: [place];  Service: General;  Laterality: N/A;  LAPAROSCOPIC REVISION VENTRICULAR-PERITONEAL (V-P) SHUNT  . VENTRICULOPERITONEAL SHUNT Right 08/29/2017   Procedure: SHUNT INSERTION VENTRICULAR-PERITONEAL;  Surgeon: Consuella Lose, MD;  Location: [place];  Service: Neurosurgery;  Laterality: Right;  SHUNT INSERTION VENTRICULAR-PERITONEAL    Family History  Problem Relation Age of Onset  . Hypertension Mother     Social History   Socioeconomic History  . Marital status: Single    Spouse name: Not on file  . Number of children: Not on file  . Years of education: Not on file  . Highest education level: Not on file  Occupational History  . Not on file  Tobacco Use  . Smoking status: Never Smoker  . Smokeless tobacco: Current User    Types: Snuff  Substance and Sexual Activity  . Alcohol use: No  . Drug use: No  . Sexual activity: Not Currently  Other Topics Concern  . Not on file  Social History Narrative  . Not on file   Social Determinants of Health   Financial Resource Strain:   . Difficulty of Paying Living Expenses:   Food Insecurity:   . Worried About Charity fundraiser in the Last Year:   . Arboriculturist in the Last Year:   Transportation Needs:   . Film/video editor (Medical):   Marland Kitchen Lack of Transportation (Non-Medical):   Physical Activity:   . Days of Exercise per Week:   . Minutes of  Exercise per Session:   Stress:   . Feeling of Stress :   Social Connections:   . Frequency of Communication with Friends and Family:   . Frequency of Social Gatherings with Friends and Family:   . Attends Religious Services:   . Active Member of Clubs or Organizations:   . Attends Banker Meetings:   Marland Kitchen Marital Status:   Intimate Partner Violence:   . Fear of Current or Ex-Partner:   . Emotionally Abused:   Marland Kitchen Physically  Abused:   . Sexually Abused:     Outpatient Medications Prior to Visit  Medication Sig Dispense Refill  . amLODipine (NORVASC) 5 MG tablet Take 1 tablet (5 mg total) by mouth daily. 30 tablet 2  . aspirin 81 MG chewable tablet Chew 1 tablet (81 mg total) by mouth daily. 30 tablet 6  . metoprolol tartrate (LOPRESSOR) 100 MG tablet Take one tablet twice daily. 60 tablet 2   No facility-administered medications prior to visit.    No Known Allergies  ROS Review of Systems  Constitutional: Negative.   HENT: Negative.   Eyes: Negative.   Respiratory: Negative.   Cardiovascular: Negative.   Gastrointestinal: Positive for abdominal distention.  Endocrine: Negative.   Genitourinary: Negative.   Musculoskeletal: Negative.   Skin: Negative.   Allergic/Immunologic: Negative.   Neurological: Positive for dizziness (occasional) and headaches (occasional).  Hematological: Negative.   Psychiatric/Behavioral: Negative.       Objective:    Physical Exam  Constitutional: She is oriented to person, place, and time. She appears well-developed and well-nourished.  HENT:  Head: Normocephalic and atraumatic.  Eyes: Conjunctivae are normal.  Cardiovascular: Normal rate, regular rhythm, normal heart sounds and intact distal pulses.  Pulmonary/Chest: Effort normal and breath sounds normal.  Abdominal: Soft. Bowel sounds are normal.  Musculoskeletal:     Cervical back: Normal range of motion and neck supple.     Comments: Right extremity weakness.   Neurological: She is alert and oriented to person, place, and time.  Skin: Skin is warm and dry.  Nursing note and vitals reviewed.   BP 127/88   Pulse 89   Temp 98.7 F (37.1 C)   Ht 5\' 1"  (1.549 m)   Wt 158 lb 6.4 oz (71.8 kg)   SpO2 100%   BMI 29.93 kg/m  Wt Readings from Last 3 Encounters:  08/19/19 158 lb 6.4 oz (71.8 kg)  01/16/19 156 lb (70.8 kg)  03/21/18 140 lb 9.6 oz (63.8 kg)     Health Maintenance Due  Topic Date Due    . Hepatitis C Screening  Never done  . COVID-19 Vaccine (1) Never done  . TETANUS/TDAP  Never done  . PAP SMEAR-Modifier  Never done  . MAMMOGRAM  Never done  . COLONOSCOPY  Never done  . DEXA SCAN  Never done  . PNA vac Low Risk Adult (1 of 2 - PCV13) Never done    There are no preventive care reminders to display for this patient.   Lab Results  Component Value Date   WBC 10.6 (H) 06/28/2018   HGB 11.8 (L) 06/28/2018   HCT 37.5 06/28/2018   MCV 87.4 06/28/2018   PLT 234 06/28/2018   Lab Results  Component Value Date   NA 137 06/28/2018   K 3.3 (L) 06/28/2018   CO2 19 (L) 06/28/2018   GLUCOSE 157 (H) 06/28/2018   BUN 12 06/28/2018   CREATININE 0.89 06/28/2018   BILITOT 0.5 06/28/2018  ALKPHOS 109 06/28/2018   AST 24 06/28/2018   ALT 18 06/28/2018   PROT 7.0 06/28/2018   ALBUMIN 3.4 (L) 06/28/2018   CALCIUM 9.1 06/28/2018   ANIONGAP 13 06/28/2018   Lab Results  Component Value Date   CHOL 120 08/16/2017   Lab Results  Component Value Date   HDL 53 08/16/2017   Lab Results  Component Value Date   LDLCALC 52 08/16/2017   Lab Results  Component Value Date   TRIG 77 08/16/2017   Lab Results  Component Value Date   CHOLHDL 2.3 08/16/2017   Lab Results  Component Value Date   HGBA1C 7.4 (A) 08/19/2019      Assessment & Plan:   1. 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 No signs or symptoms of recurrence noted or reported.   2. Right sided weakness Stable.  3. Decreased ambulation  We will send order for Rollator to encourage more ambulation.  4. Essential hypertension The current medical regimen is effective; blood pressure is stable at 127/88 today; continue present plan and medications as prescribed. She will continue to take medications as prescribed, to decrease high sodium intake, excessive alcohol intake, increase potassium intake, smoking cessation, and increase physical activity of at least 30 minutes of cardio activity daily. She  will continue to follow Heart Healthy or DASH diet.  4. Health care maintenance - POCT urinalysis dipstick - POCT glycosylated hemoglobin (Hb A1C) - POCT glucose (manual entry) - CBC with Differential - Comprehensive metabolic panel - Lipid Panel - TSH - Vitamin B12 - Vitamin D, 25-hydroxy  5. Follow up She will follow up in 6 months.   No orders of the defined types were placed in this encounter.  Orders Placed This Encounter  Procedures  . CBC with Differential  . Comprehensive metabolic panel  . Lipid Panel  . TSH  . Vitamin B12  . Vitamin D, 25-hydroxy  . POCT urinalysis dipstick  . POCT glycosylated hemoglobin (Hb A1C)  . POCT glucose (manual entry)    Referral Orders  No referral(s) requested today    Raliegh Ip,  MSN, FNP-BC [facility] Patient Care Center/Sick[facility] Cell [facility] [address] [place], [place] [address] [phone] fax   Problem List Items Addressed This Visit      Cardiovascular and Mediastinum   Essential hypertension    Other Visit Diagnoses    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    -  Primary   Right sided weakness       Health care maintenance       Relevant Orders   POCT urinalysis dipstick   POCT glycosylated hemoglobin (Hb A1C) (Completed)   POCT glucose (manual entry) (Completed)   CBC with Differential   Comprehensive metabolic panel   Lipid Panel   TSH   Vitamin B12   Vitamin D, 25-hydroxy   Follow up          No orders of the defined types were placed in this encounter.   Follow-up: Return in about 6 months (around 02/19/2020).    Kallie Locks, FNP

## 2019-08-20 ENCOUNTER — Encounter: Payer: Self-pay | Admitting: Family Medicine

## 2019-08-20 ENCOUNTER — Other Ambulatory Visit: Payer: Self-pay | Admitting: Family Medicine

## 2019-08-20 DIAGNOSIS — E559 Vitamin D deficiency, unspecified: Secondary | ICD-10-CM

## 2019-08-20 LAB — LIPID PANEL
Chol/HDL Ratio: 3.3 ratio (ref 0.0–4.4)
Cholesterol, Total: 145 mg/dL (ref 100–199)
HDL: 44 mg/dL (ref >39)
LDL Chol Calc (NIH): 81 mg/dL (ref 0–99)
Triglycerides: 109 mg/dL (ref 0–149)
VLDL Cholesterol Cal: 20 mg/dL (ref 5–40)

## 2019-08-20 LAB — COMPREHENSIVE METABOLIC PANEL
ALT: 12 IU/L (ref 0–32)
AST: 14 IU/L (ref 0–40)
Albumin/Globulin Ratio: 1.2 (ref 1.2–2.2)
Albumin: 4 g/dL (ref 3.8–4.8)
Alkaline Phosphatase: 134 IU/L — ABNORMAL HIGH (ref 48–121)
BUN/Creatinine Ratio: 16 (ref 12–28)
BUN: 13 mg/dL (ref 8–27)
Bilirubin Total: 0.2 mg/dL (ref 0.0–1.2)
CO2: 24 mmol/L (ref 20–29)
Calcium: 9.9 mg/dL (ref 8.7–10.3)
Chloride: 106 mmol/L (ref 96–106)
Creatinine, Ser: 0.82 mg/dL (ref 0.57–1.00)
GFR calc Af Amer: 87 mL/min/{1.73_m2} (ref >59)
GFR calc non Af Amer: 75 mL/min/{1.73_m2} (ref >59)
Globulin, Total: 3.4 g/dL (ref 1.5–4.5)
Glucose: 121 mg/dL — ABNORMAL HIGH (ref 65–99)
Potassium: 3.7 mmol/L (ref 3.5–5.2)
Sodium: 141 mmol/L (ref 134–144)
Total Protein: 7.4 g/dL (ref 6.0–8.5)

## 2019-08-20 LAB — CBC WITH DIFFERENTIAL/PLATELET
Basophils Absolute: 0 10*3/uL (ref 0.0–0.2)
Basos: 1 %
EOS (ABSOLUTE): 0.1 10*3/uL (ref 0.0–0.4)
Eos: 1 %
Hematocrit: 38 % (ref 34.0–46.6)
Hemoglobin: 12.8 g/dL (ref 11.1–15.9)
Immature Grans (Abs): 0 10*3/uL (ref 0.0–0.1)
Immature Granulocytes: 0 %
Lymphocytes Absolute: 3.3 10*3/uL — ABNORMAL HIGH (ref 0.7–3.1)
Lymphs: 42 %
MCH: 28.1 pg (ref 26.6–33.0)
MCHC: 33.7 g/dL (ref 31.5–35.7)
MCV: 84 fL (ref 79–97)
Monocytes Absolute: 0.4 10*3/uL (ref 0.1–0.9)
Monocytes: 6 %
Neutrophils Absolute: 4 10*3/uL (ref 1.4–7.0)
Neutrophils: 50 %
Platelets: 257 10*3/uL (ref 150–450)
RBC: 4.55 x10E6/uL (ref 3.77–5.28)
RDW: 14 % (ref 11.7–15.4)
WBC: 7.9 10*3/uL (ref 3.4–10.8)

## 2019-08-20 LAB — TSH: TSH: 1.8 u[IU]/mL (ref 0.450–4.500)

## 2019-08-20 LAB — VITAMIN B12: Vitamin B-12: 690 pg/mL (ref 232–1245)

## 2019-08-20 LAB — VITAMIN D 25 HYDROXY (VIT D DEFICIENCY, FRACTURES): Vit D, 25-Hydroxy: 8.4 ng/mL — ABNORMAL LOW (ref 30.0–100.0)

## 2019-08-20 MED ORDER — VITAMIN D (ERGOCALCIFEROL) 1.25 MG (50000 UNIT) PO CAPS: 50000.0000 [IU] | ORAL_CAPSULE | ORAL | 6 refills | Status: DC

## 2019-08-21 ENCOUNTER — Encounter: Payer: Self-pay | Admitting: Family Medicine

## 2019-08-21 DIAGNOSIS — Z8673 Personal history of transient ischemic attack (TIA), and cerebral infarction without residual deficits: Secondary | ICD-10-CM | POA: Insufficient documentation

## 2019-08-21 DIAGNOSIS — Z7409 Other reduced mobility: Secondary | ICD-10-CM | POA: Insufficient documentation

## 2019-10-10 IMAGING — CT CT HEAD CODE STROKE
3 of 4 series · 13 of 47 positions shown, 15 images · non-contrast
Comparison: None.

CLINICAL DATA: Code stroke.  Flaccid right side.

EXAM:
CT HEAD WITHOUT CONTRAST
TECHNIQUE: Contiguous axial images were obtained from the base of the skull
through the vertex without intravenous contrast.

[Series 3: head wo · axial · 0.46mm/px · z∈[-148,-28]mm · 7 of 33 slices shown, 9 images]
[im 5/33  brain]
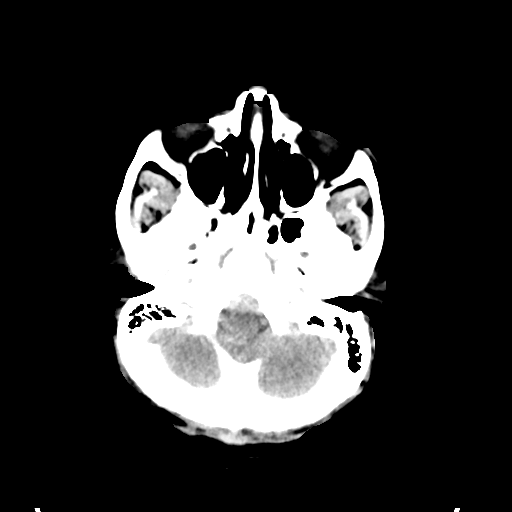
[im 5/33  bone]
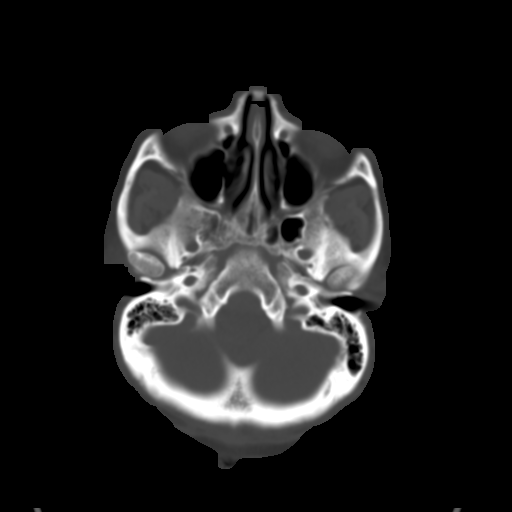
[im 9/33  brain]
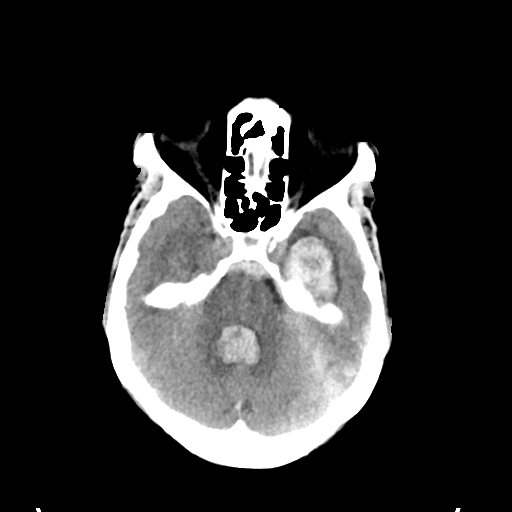
[im 13/33  brain]
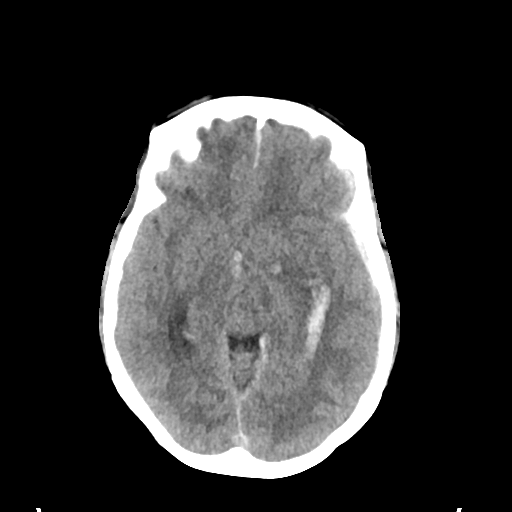
[im 17/33  brain]
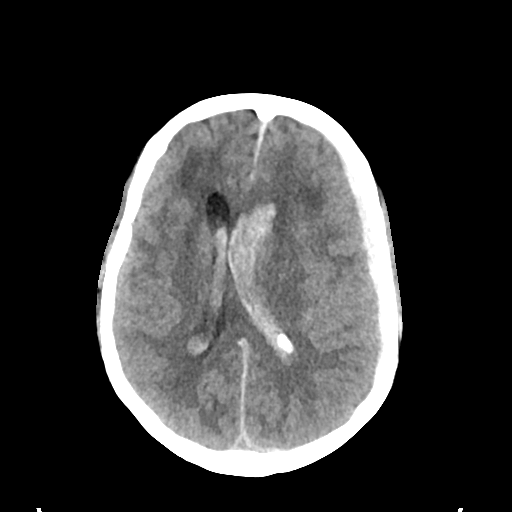
[im 21/33  brain]
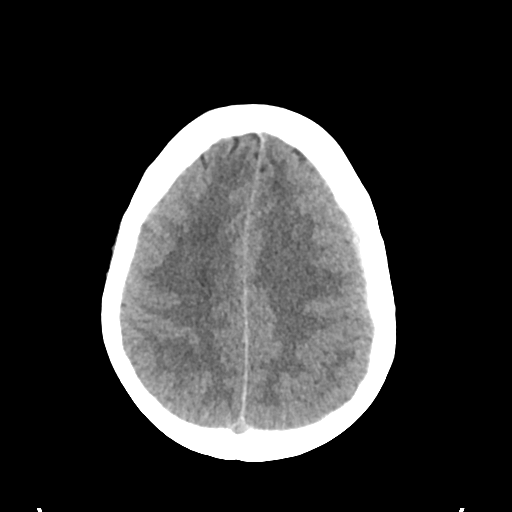
[im 21/33  bone]
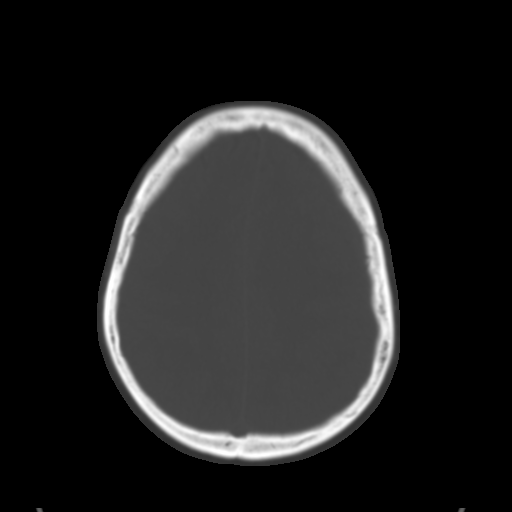
[im 25/33  brain]
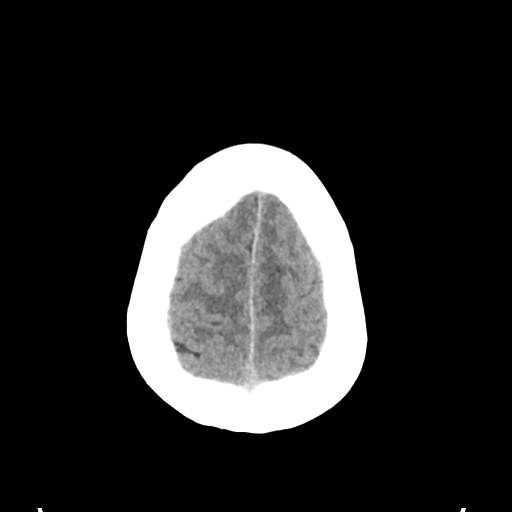
[im 29/33  brain]
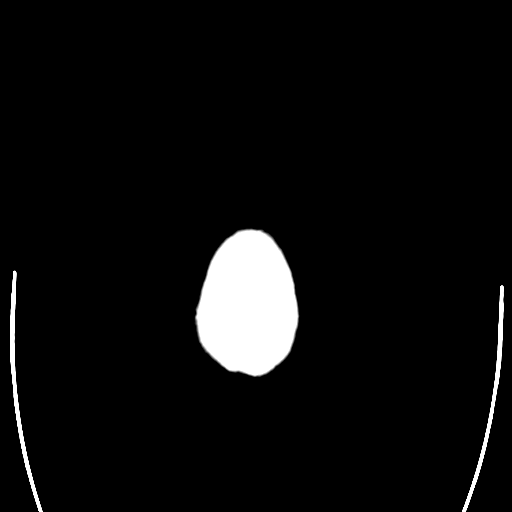

[Series 5: cor soft · coronal · 0.30mm/px · 3 of 67 slices shown]
[im 23/67  brain]
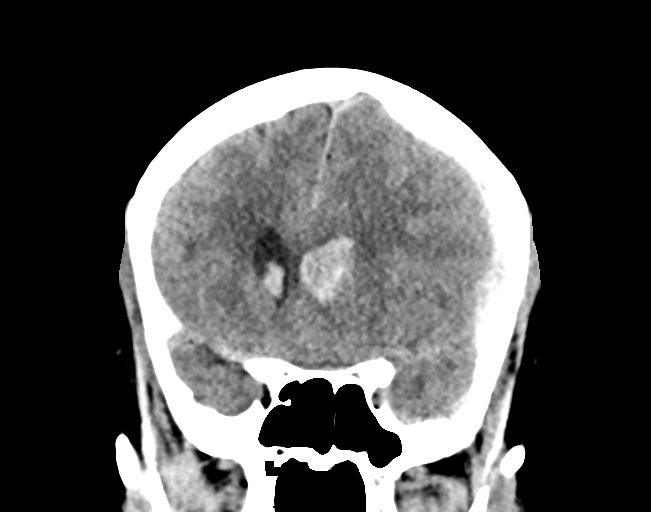
[im 30/67  brain]
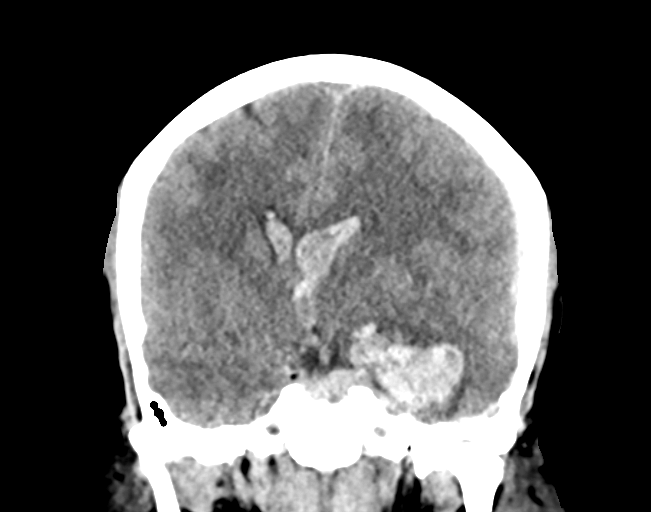
[im 37/67  brain]
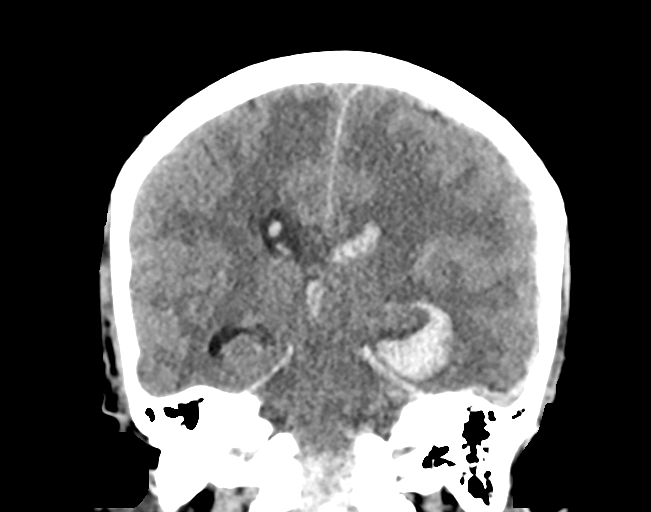

[Series 6: sag soft · sagittal · 0.34mm/px · 3 of 63 slices shown]
[im 21/63  brain]
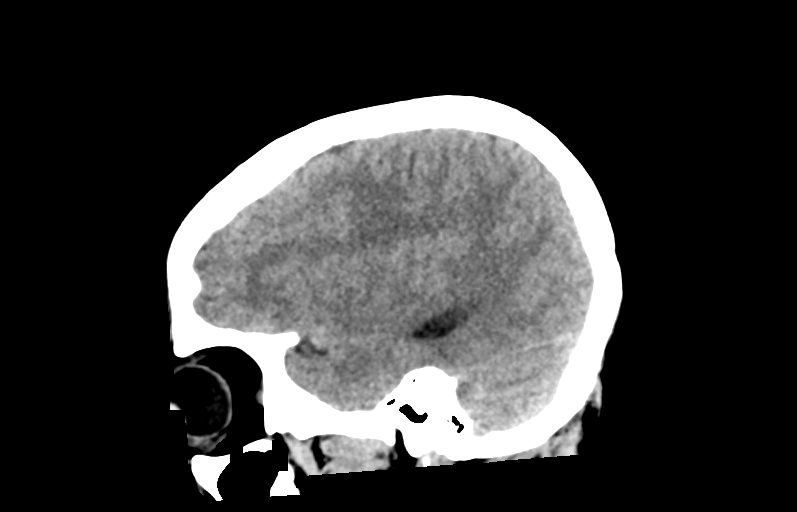
[im 32/63  brain]
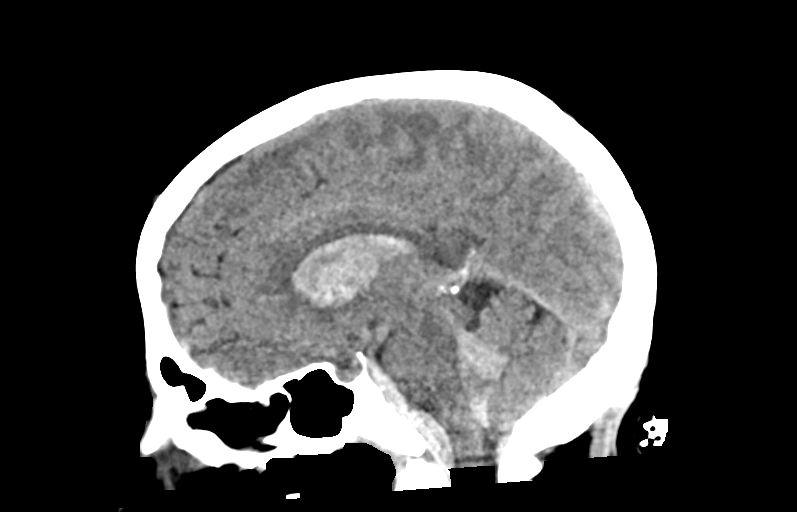
[im 42/63  brain]
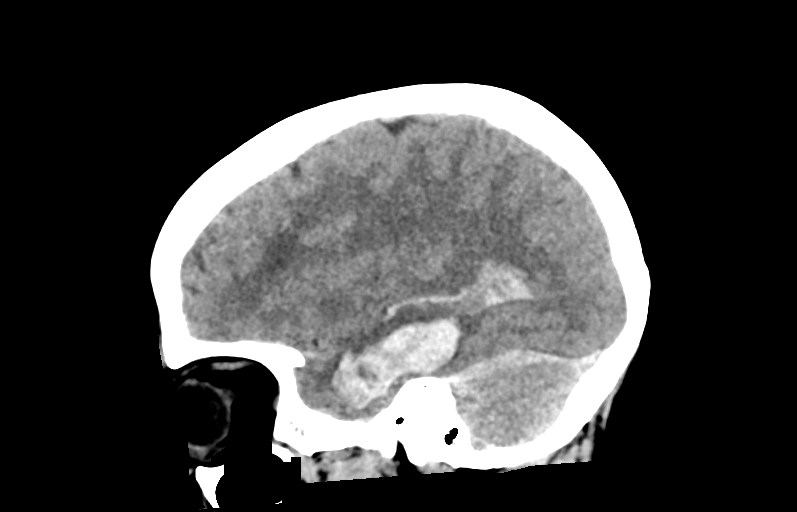

[13 of 47 positions shown; findings below may reference images not displayed]

FINDINGS: Brain: 4.2 x 3.4 x 2.1 cm (estimated volume 12 cc) intraparenchymal
hematoma in the medial left temporal lobe with intraventricular
penetration in filling of the left lateral ventricle with blood.
Smaller amount of blood in the right lateral ventricle. Third
ventricle and fourth ventricle are filled. Mild edema surrounding
the left temporal lobe hematoma suggesting that this is not
hyperacute. Mass effect with right-to-left shift of 11 mm.
Otherwise, the brain shows water probably chronic small-vessel
ischemic changes of the deep white matter. There is subdural blood
along the convexity on the left with maximal thickness of 6 mm.
Small amount of blood also along the left tentorium.

Vascular: Normal

Skull: Normal

Sinuses/Orbits: Clear/normal

Other: None

ASPECTS (Alberta Stroke Program Early CT Score)

- Ganglionic level infarction (caudate, lentiform nuclei, internal
capsule, insula, M1-M3 cortex): 7

- Supraganglionic infarction (M4-M6 cortex): 3

Total score (0-10 with 10 being normal): 10
IMPRESSION: 1. Recent intraparenchymal hemorrhage affecting the mesial temporal
lobe on the left with a hematoma 12 cc volume. Intraventricular
penetration. Small amount of surrounding edema suggesting that this
may be several hours to a day old. Mass effect with left-to-right
shift of 11 mm. 6 mm subdural hematoma along the lateral convexity
on the left with a small amount along the left tentorium.
2. ASPECTS is 10.
3. Dr. Ovick aware of the results, with neuro surgery contact.

## 2019-10-15 ENCOUNTER — Other Ambulatory Visit: Payer: Self-pay | Admitting: Family Medicine

## 2019-10-15 DIAGNOSIS — I10 Essential (primary) hypertension: Secondary | ICD-10-CM

## 2019-10-15 MED ORDER — AMLODIPINE BESYLATE 5 MG PO TABS: 5.0000 mg | ORAL_TABLET | Freq: Every day | ORAL | 3 refills | Status: DC

## 2019-10-15 NOTE — Telephone Encounter (Signed)
pt needs all meds refilled asap

## 2019-11-11 ENCOUNTER — Other Ambulatory Visit: Payer: Self-pay | Admitting: Family Medicine

## 2019-11-11 DIAGNOSIS — I10 Essential (primary) hypertension: Secondary | ICD-10-CM

## 2019-11-12 ENCOUNTER — Other Ambulatory Visit: Payer: Self-pay | Admitting: Family Medicine

## 2019-11-12 DIAGNOSIS — I10 Essential (primary) hypertension: Secondary | ICD-10-CM

## 2020-02-19 ENCOUNTER — Ambulatory Visit: Payer: Medicaid Other | Admitting: Family Medicine

## 2020-03-02 ENCOUNTER — Ambulatory Visit: Payer: Medicaid Other | Admitting: Family Medicine

## 2020-03-09 ENCOUNTER — Other Ambulatory Visit: Payer: Self-pay | Admitting: Family Medicine

## 2020-03-09 DIAGNOSIS — I10 Essential (primary) hypertension: Secondary | ICD-10-CM

## 2020-03-09 NOTE — Telephone Encounter (Signed)
Please see refill request.

## 2020-03-15 ENCOUNTER — Ambulatory Visit: Payer: Medicaid Other | Admitting: Family Medicine

## 2020-04-06 ENCOUNTER — Ambulatory Visit: Payer: Medicare Other | Admitting: Family Medicine

## 2020-04-09 ENCOUNTER — Emergency Department (HOSPITAL_COMMUNITY): Payer: Medicare Other

## 2020-04-09 ENCOUNTER — Other Ambulatory Visit: Payer: Self-pay

## 2020-04-09 ENCOUNTER — Emergency Department (HOSPITAL_COMMUNITY)
Admission: EM | Admit: 2020-04-09 | Discharge: 2020-04-09 | Disposition: A | Discharge status: Home / Self Care | Payer: Medicare Other | Attending: Emergency Medicine | Admitting: Emergency Medicine

## 2020-04-09 DIAGNOSIS — E119 Type 2 diabetes mellitus without complications: Secondary | ICD-10-CM | POA: Insufficient documentation

## 2020-04-09 DIAGNOSIS — Z79899 Other long term (current) drug therapy: Secondary | ICD-10-CM | POA: Insufficient documentation

## 2020-04-09 DIAGNOSIS — R569 Unspecified convulsions: Secondary | ICD-10-CM | POA: Insufficient documentation

## 2020-04-09 DIAGNOSIS — Z7982 Long term (current) use of aspirin: Secondary | ICD-10-CM | POA: Diagnosis not present

## 2020-04-09 DIAGNOSIS — F17220 Nicotine dependence, chewing tobacco, uncomplicated: Secondary | ICD-10-CM | POA: Diagnosis not present

## 2020-04-09 DIAGNOSIS — Z8673 Personal history of transient ischemic attack (TIA), and cerebral infarction without residual deficits: Secondary | ICD-10-CM | POA: Diagnosis not present

## 2020-04-09 DIAGNOSIS — I10 Essential (primary) hypertension: Secondary | ICD-10-CM | POA: Diagnosis not present

## 2020-04-09 LAB — CBC WITH DIFFERENTIAL/PLATELET
Abs Immature Granulocytes: 0.04 10*3/uL (ref 0.00–0.07)
Basophils Absolute: 0.1 10*3/uL (ref 0.0–0.1)
Basophils Relative: 1 %
Eosinophils Absolute: 0.1 10*3/uL (ref 0.0–0.5)
Eosinophils Relative: 0 %
HCT: 34.7 % — ABNORMAL LOW (ref 36.0–46.0)
Hemoglobin: 11.4 g/dL — ABNORMAL LOW (ref 12.0–15.0)
Immature Granulocytes: 0 %
Lymphocytes Relative: 20 %
Lymphs Abs: 2.3 10*3/uL (ref 0.7–4.0)
MCH: 29.2 pg (ref 26.0–34.0)
MCHC: 32.9 g/dL (ref 30.0–36.0)
MCV: 89 fL (ref 80.0–100.0)
Monocytes Absolute: 1.1 10*3/uL — ABNORMAL HIGH (ref 0.1–1.0)
Monocytes Relative: 10 %
Neutro Abs: 7.8 10*3/uL — ABNORMAL HIGH (ref 1.7–7.7)
Neutrophils Relative %: 69 %
Platelets: 196 10*3/uL (ref 150–400)
RBC: 3.9 MIL/uL (ref 3.87–5.11)
RDW: 15 % (ref 11.5–15.5)
WBC: 11.4 10*3/uL — ABNORMAL HIGH (ref 4.0–10.5)
nRBC: 0 % (ref 0.0–0.2)

## 2020-04-09 LAB — URINALYSIS, ROUTINE W REFLEX MICROSCOPIC
Bacteria, UA: NONE SEEN
Bilirubin Urine: NEGATIVE
Glucose, UA: >=500 mg/dL — AB
Ketones, ur: NEGATIVE mg/dL
Leukocytes,Ua: NEGATIVE
Nitrite: NEGATIVE
Protein, ur: NEGATIVE mg/dL
Specific Gravity, Urine: 1.01 (ref 1.005–1.030)
pH: 6 (ref 5.0–8.0)

## 2020-04-09 LAB — COMPREHENSIVE METABOLIC PANEL
ALT: 15 U/L (ref 0–44)
AST: 17 U/L (ref 15–41)
Albumin: 3.3 g/dL — ABNORMAL LOW (ref 3.5–5.0)
Alkaline Phosphatase: 68 U/L (ref 38–126)
Anion gap: 7 (ref 5–15)
BUN: 9 mg/dL (ref 8–23)
CO2: 23 mmol/L (ref 22–32)
Calcium: 8.4 mg/dL — ABNORMAL LOW (ref 8.9–10.3)
Chloride: 110 mmol/L (ref 98–111)
Creatinine, Ser: 0.99 mg/dL (ref 0.44–1.00)
GFR, Estimated: >60 mL/min (ref >60)
Glucose, Bld: 222 mg/dL — ABNORMAL HIGH (ref 70–99)
Potassium: 3 mmol/L — ABNORMAL LOW (ref 3.5–5.1)
Sodium: 140 mmol/L (ref 135–145)
Total Bilirubin: 0.5 mg/dL (ref 0.3–1.2)
Total Protein: 6.7 g/dL (ref 6.5–8.1)

## 2020-04-09 MED ORDER — SODIUM CHLORIDE 0.9 % IV BOLUS
500.0000 mL | Freq: Once | INTRAVENOUS | Status: AC
  Administered 2020-04-09: 500 mL via INTRAVENOUS

## 2020-04-09 MED ORDER — POTASSIUM CHLORIDE CRYS ER 20 MEQ PO TBCR
40.0000 meq | EXTENDED_RELEASE_TABLET | Freq: Once | ORAL | Status: AC
  Administered 2020-04-09: 40 meq via ORAL
  Filled 2020-04-09: qty 2

## 2020-04-09 MED ORDER — LEVETIRACETAM 500 MG PO TABS: 500.0000 mg | ORAL_TABLET | Freq: Two times a day (BID) | ORAL | 3 refills | Status: DC

## 2020-04-09 MED ORDER — LEVETIRACETAM 500 MG PO TABS
500.0000 mg | ORAL_TABLET | Freq: Once | ORAL | Status: AC
  Administered 2020-04-09: 500 mg via ORAL
  Filled 2020-04-09: qty 1

## 2020-04-09 NOTE — ED Provider Notes (Signed)
[facility]-EMERGENCY DEPT Provider Note   CSN: [id number] Arrival date & time: 04/09/20  0106     History Chief Complaint  Patient presents with  . Altered Mental Status    Carla Bauer is a 65 y.o. female.  Patient presents to the emergency department for evaluation of possible seizure.  [name] grandson called an ambulance because she had an episode where she was staring and not responding.  No shaking or generalized seizure activity.  EMS responded to the scene and found her awake and alert.  No postictal activity.  She did not have any urinary incontinence.  EMS report that the patient's blood sugar is elevated.  She does not take any medications currently for diabetes.  Patient denies headache, chest pain, shortness of breath.        Past Medical History:  Diagnosis Date  . Altered mental status   . Hypertension   . Insomnia   . Right sided weakness   . Seizure-like activity (HCC)   . Stroke (cerebrum) (HCC)   . Vitamin D deficiency 08/2019    Patient Active Problem List   Diagnosis Date Noted  . 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 Decreased ambulation status 08/21/2019  . Benign essential HTN   . Hypernatremia   . Dysphagia due to recent stroke   . Spastic hemiparesis (HCC)   . S/P percutaneous endoscopic gastrostomy (PEG) tube placement (HCC)   . Labile blood glucose   . Sinus tachycardia   . Intraparenchymal hematoma of brain (HCC) 09/03/2017  . Right hemiparesis (HCC)   . Oropharyngeal dysphagia   . Essential hypertension   . Acute lower UTI   . Tachypnea   . Tachycardia   . Reactive hypertension   . Prediabetes   . Acute blood loss anemia   . Hypokalemia   . Fever   . Leukocytosis   . Acute respiratory failure with hypoxia (HCC)   . 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 ICH (intracerebral hemorrhage) (HCC) 08/14/2017  . SAH (subarachnoid  hemorrhage) (HCC) 08/14/2017  . 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 Hydrocephalus (HCC) 08/14/2017  . Brain herniation (HCC) 08/14/2017  . Cytotoxic brain edema (HCC) 08/14/2017    Past Surgical History:  Procedure Laterality Date  . CESAREAN SECTION    . CRANIOTOMY Left 08/14/2017   Procedure: 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;  Surgeon: Lisbeth Renshaw, MD;  Location: [facility] OR;  Service: Neurosurgery;  Laterality: Left;  . IR GASTROSTOMY TUBE MOD SED  09/16/2017  . IR GASTROSTOMY TUBE REMOVAL  12/06/2017  . LAPAROSCOPIC REVISION VENTRICULAR-PERITONEAL (V-P) SHUNT N/A 08/29/2017   Procedure: LAPAROSCOPIC REVISION VENTRICULAR-PERITONEAL (V-P) SHUNT;  Surgeon: Abigail Miyamoto, MD;  Location: [facility] OR;  Service: General;  Laterality: N/A;  LAPAROSCOPIC REVISION VENTRICULAR-PERITONEAL (V-P) SHUNT  . VENTRICULOPERITONEAL SHUNT Right 08/29/2017   Procedure: SHUNT INSERTION VENTRICULAR-PERITONEAL;  Surgeon: Lisbeth Renshaw, MD;  Location: [facility] OR;  Service: Neurosurgery;  Laterality: Right;  SHUNT INSERTION VENTRICULAR-PERITONEAL     OB History   No obstetric history on file.     Family History  Problem Relation Age of Onset  . Hypertension Mother     Social History   Tobacco Use  . Smoking status: Never Smoker  . Smokeless tobacco: Current User    Types: Snuff  Vaping Use  . Vaping Use: Never used  Substance Use Topics  . Alcohol use:  No  . Drug use: No    Home Medications Prior to Admission medications   Medication Sig Start Date End Date Taking? Authorizing Provider  levETIRAcetam (KEPPRA) 500 MG tablet Take 1 tablet (500 mg total) by mouth 2 (two) times daily. 04/09/20  Yes Naftoli Penny, Canary Brimhristopher J, MD  amLODipine (NORVASC) 5 MG tablet TAKE 1 TABLET(5 MG) BY MOUTH DAILY 03/09/20   Kallie LocksStroud, Natalie M, FNP  aspirin 81 MG chewable tablet CHEW AND SWALLOW 1 TABLET(81 MG) BY MOUTH DAILY 03/09/20    Kallie LocksStroud, Natalie M, FNP  metoprolol tartrate (LOPRESSOR) 100 MG tablet TAKE 1 TABLET BY MOUTH TWICE DAILY 03/09/20   Kallie LocksStroud, Natalie M, FNP  Vitamin D, Ergocalciferol, (DRISDOL) 1.25 MG (50000 UNIT) CAPS capsule Take 1 capsule (50,000 Units total) by mouth every 7 (seven) days. 08/20/19   Kallie LocksStroud, Natalie M, FNP    Allergies    Patient has no known allergies.  Review of Systems   Review of Systems  Neurological: Positive for seizures.  All other systems reviewed and are negative.   Physical Exam Updated Vital Signs BP 104/74   Pulse 88   Temp 99 F (37.2 C) (Oral)   Resp 18   Ht 5\' 1"  (1.549 m)   Wt 72.6 kg   SpO2 99%   BMI 30.23 kg/m   Physical Exam Vitals and nursing note reviewed.  Constitutional:      General: She is not in acute distress.    Appearance: Normal appearance. She is well-developed and well-nourished.  HENT:     Head: Normocephalic and atraumatic.     Right Ear: Hearing normal.     Left Ear: Hearing normal.     Nose: Nose normal.     Mouth/Throat:     Mouth: Oropharynx is clear and moist and mucous membranes are normal.  Eyes:     Extraocular Movements: EOM normal.     Conjunctiva/sclera: Conjunctivae normal.     Pupils: Pupils are equal, round, and reactive to light.  Cardiovascular:     Rate and Rhythm: Regular rhythm.     Heart sounds: S1 normal and S2 normal. No murmur heard. No friction rub. No gallop.   Pulmonary:     Effort: Pulmonary effort is normal. No respiratory distress.     Breath sounds: Normal breath sounds.  Chest:     Chest wall: No tenderness.  Abdominal:     General: Bowel sounds are normal.     Palpations: Abdomen is soft. There is no hepatosplenomegaly.     Tenderness: There is no abdominal tenderness. There is no guarding or rebound. Negative signs include Murphy's sign and McBurney's sign.     Hernia: No hernia is present.  Musculoskeletal:        General: Deformity (Contraction right upper extremity) present. Normal range  of motion.     Cervical back: Normal range of motion and neck supple.  Skin:    General: Skin is warm, dry and intact.     Findings: No rash.     Nails: There is no cyanosis.  Neurological:     Mental Status: She is alert and oriented to person, place, and time.     GCS: GCS eye subscore is 4. GCS verbal subscore is 5. GCS motor subscore is 6.     Deep Tendon Reflexes: Strength normal.     Comments: Right hemiplegia  Psychiatric:        Mood and Affect: Mood and affect normal.  Speech: Speech normal.        Behavior: Behavior normal.        Thought Content: Thought content normal.     ED Results / Procedures / Treatments   Labs (all labs ordered are listed, but only abnormal results are displayed) Labs Reviewed  CBC WITH DIFFERENTIAL/PLATELET - Abnormal; Notable for the following components:      Result Value   WBC 11.4 (*)    Hemoglobin 11.4 (*)    HCT 34.7 (*)    Neutro Abs 7.8 (*)    Monocytes Absolute 1.1 (*)    All other components within normal limits  COMPREHENSIVE METABOLIC PANEL - Abnormal; Notable for the following components:   Potassium 3.0 (*)    Glucose, Bld 222 (*)    Calcium 8.4 (*)    Albumin 3.3 (*)    All other components within normal limits  URINALYSIS, ROUTINE W REFLEX MICROSCOPIC    EKG EKG Interpretation  Date/Time:  Saturday April 09 2020 01:30:09 EST Ventricular Rate:  94 PR Interval:    QRS Duration: 85 QT Interval:  355 QTC Calculation: 444 R Axis:   120 Text Interpretation: Right and left arm electrode reversal, interpretation assumes no reversal Sinus rhythm Right axis deviation Abnormal T, consider ischemia, lateral leads Confirmed by Gilda Crease [phone]) on 04/09/2020 6:57:52 AM   Radiology CT HEAD WO CONTRAST  Result Date: 04/09/2020 CLINICAL DATA:  Initial evaluation for mental status change, unknown cause. EXAM: CT HEAD WITHOUT CONTRAST TECHNIQUE: Contiguous axial images were obtained from the base of the skull  through the vertex without intravenous contrast. COMPARISON:  Prior head CT from 06/28/2018. FINDINGS: Brain: Sequelae of prior left craniotomy for surgical clipping of aneurysm. Aneurysm clip near the left ICA terminus. Right frontal approach VP shunt catheter in place with tip terminating near the left thalamus. Small amount of encephalomalacia along the catheter tract. Stable ventricular size and morphology without hydrocephalus. Left temporal encephalomalacia again noted, stable. Underlying chronic microvascular ischemic disease, mildly progressed from previous. No acute intracranial hemorrhage. No acute large vessel territory infarct. No mass lesion, midline shift or mass effect. No extra-axial fluid collection. Vascular: Aneurysm clip near the left ICA terminus. No hyperdense vessel. Skull: Right frontal approach shunt catheter in place. Prior left craniotomy. Scalp soft tissues demonstrate no acute finding. Calvarium otherwise intact. Sinuses/Orbits: Globes and orbital soft tissues within normal limits. Scattered mucosal thickening noted throughout the ethmoidal air cells and maxillary sinuses. Small air-fluid levels noted within the visualized maxillary sinuses, right greater than left. Mastoid air cells are largely clear. Other: None. IMPRESSION: 1. No acute intracranial abnormality. 2. Sequelae of prior left craniotomy for surgical clipping of aneurysm. Stable left temporal encephalomalacia. 3. Right frontal approach VP shunt catheter in place with tip terminating near the left thalamus. Stable ventricular size and morphology without hydrocephalus. 4. Chronic microvascular ischemic disease, mildly progressed from previous. Electronically Signed   By: Rise Mu M.D.   On: 04/09/2020 02:34    Procedures Procedures (including critical care time)  Medications Ordered in ED Medications  levETIRAcetam (KEPPRA) tablet 500 mg (has no administration in time range)  potassium chloride SA  (KLOR-CON) CR tablet 40 mEq (has no administration in time range)  sodium chloride 0.9 % bolus 500 mL (0 mLs Intravenous Stopped 04/09/20 0300)    ED Course  I have reviewed the triage vital signs and the nursing notes.  Pertinent labs & imaging results that were available during my care of the patient  were reviewed by me and considered in my medical decision making (see chart for details).    MDM Rules/Calculators/A&P                          Patient presents to the emergency department with complaints of possible seizure.  She has a history of possible seizures in the past but is not on anti-epileptics.  Patient had episodes of staring but no seizure-like activity prehospital.  No postictal state.  No incontinence.  Unclear if this was a seizure.  Previous records indicate that she has been on Keppra in the past.  She is not currently on Keppra, will represcribed, follow-up with neurology.  Patient with VP shunt in previous intracranial bleed.  No evidence of recurrent bleed.  Shunt appears to be functioning properly.  Patient has done well.  Work-up unremarkable.  No further seizure activity.  She appears appropriate for discharge, follow-up as an outpatient with neurology.  Final Clinical Impression(s) / ED Diagnoses Final diagnoses:  Seizure-like activity (HCC)    Rx / DC Orders ED Discharge Orders         Ordered    levETIRAcetam (KEPPRA) 500 MG tablet  2 times daily        04/09/20 0719           Gilda Crease, MD 04/09/20 [phone]

## 2020-04-09 NOTE — Discharge Instructions (Addendum)
Follow-up with your neurologist next available appointment.  If you do not currently have a neurologist, please call one of the listed neurology groups to schedule follow-up.

## 2020-04-09 NOTE — ED Triage Notes (Signed)
Pt came in with c/o possible seizure and AMS. Pt is talkative and oriented on arrival. According to EMS, a focal seizure was witnessed by a grandchild in the home. EMS states that pt had a couple of episodes of "staring off". Pt does not appear postictal. Pt does not have any complaints at this time. Vitals stable.

## 2020-04-09 NOTE — ED Notes (Signed)
Patient stated that daughter is not able to pick her up and does not have a ride.

## 2020-04-15 ENCOUNTER — Telehealth (INDEPENDENT_AMBULATORY_CARE_PROVIDER_SITE_OTHER): Payer: Medicare Other | Admitting: Family Medicine

## 2020-04-15 DIAGNOSIS — Z8673 Personal history of transient ischemic attack (TIA), and cerebral infarction without residual deficits: Secondary | ICD-10-CM

## 2020-04-15 DIAGNOSIS — Z09 Encounter for follow-up examination after completed treatment for conditions other than malignant neoplasm: Secondary | ICD-10-CM

## 2020-04-15 DIAGNOSIS — I10 Essential (primary) hypertension: Secondary | ICD-10-CM

## 2020-04-15 DIAGNOSIS — R531 Weakness: Secondary | ICD-10-CM | POA: Diagnosis not present

## 2020-04-15 DIAGNOSIS — Z7409 Other reduced mobility: Secondary | ICD-10-CM

## 2020-04-15 DIAGNOSIS — R4182 Altered mental status, unspecified: Secondary | ICD-10-CM | POA: Diagnosis not present

## 2020-04-15 NOTE — Progress Notes (Signed)
Virtual Visit via Telephone Note  I connected with Carla Bauer on 04/15/20 at  1:00 PM EST by telephone and verified that I am speaking with the correct person using two identifiers.  Location: Patient: Home Provider: Home   I discussed the limitations, risks, security and privacy concerns of performing an evaluation and management service by telephone and the availability of in person appointments. I also discussed with the patient that there may be a patient responsible charge related to this service. The patient expressed understanding and agreed to proceed.   History of Present Illness:  Patient Active Problem List   Diagnosis Date Noted  . 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 Decreased ambulation status 08/21/2019  . Benign essential HTN   . Hypernatremia   . Dysphagia due to recent stroke   . Spastic hemiparesis (HCC)   . S/P percutaneous endoscopic gastrostomy (PEG) tube placement (HCC)   . Labile blood glucose   . Sinus tachycardia   . Intraparenchymal hematoma of brain (HCC) 09/03/2017  . Right hemiparesis (HCC)   . Oropharyngeal dysphagia   . Essential hypertension   . Acute lower UTI   . Tachypnea   . Tachycardia   . Reactive hypertension   . Prediabetes   . Acute blood loss anemia   . Hypokalemia   . Fever   . Leukocytosis   . Acute respiratory failure with hypoxia (HCC)   . 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 ICH (intracerebral hemorrhage) (HCC) 08/14/2017  . SAH (subarachnoid hemorrhage) (HCC) 08/14/2017  . 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 Hydrocephalus (HCC) 08/14/2017  . Brain herniation (HCC) 08/14/2017  . Cytotoxic brain edema (HCC) 08/14/2017    Current Status: Since her last office visit, she has had an ED visit for Altered Mental Status on 04/09/2020 is doing well with no complaints. She was placed on Keppra  and advised to follow up with Neurologist. She has not had any headaches, visual changes, dizziness, and falls. She denies fevers, chills, fatigue, recent infections, weight loss, and night sweats.  No chest pain, heart palpitations, cough and shortness of breath reported. Denies GI problems such as nausea, vomiting, diarrhea, and constipation. She has no reports of blood in stools, dysuria and hematuria. No depression or anxiety reported today. She is taking all medications as prescribed.     Observations/Objective:  Telephone Virtual Visit   Assessment and Plan:  1. Hospital discharge follow-up - Ambulatory referral to Neurology  2. Altered mental status, unspecified altered mental status type No signs or symptoms of recurrence noted or reported today.  - Ambulatory referral to Neurology  3. 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 Recent ED visit for altered mental status. We will refer her to Neurology.   4. Right sided weakness  5. Decreased ambulation status  6. Essential hypertension She will continue to take medications as prescribed, to decrease high sodium intake, excessive alcohol intake, increase potassium intake, smoking cessation, and increase physical activity of at least 30 minutes of cardio activity daily. She will continue to follow Heart Healthy or DASH diet.  7. Follow up She will follow up in 2-3 months.  No orders of the defined types were placed in this encounter.   Orders Placed This Encounter  Procedures  . Ambulatory referral to Neurology     Referral Orders     Ambulatory referral to Neurology   Raliegh Ip,  MSN, ANE, FNP-BC [facility] [address] [place], [place] [address] [phone] fax     I discussed the assessment and treatment plan with the patient. The patient was provided an opportunity to ask questions and all were  answered. The patient agreed with the plan and demonstrated an understanding of the instructions.   The patient was advised to call back or seek an in-person evaluation if the symptoms worsen or if the condition fails to improve as anticipated.  I provided 20 minutes of non-face-to-face time during this encounter.   Kallie Locks, FNP

## 2020-04-16 ENCOUNTER — Encounter: Payer: Self-pay | Admitting: Family Medicine

## 2020-04-20 ENCOUNTER — Encounter: Payer: Self-pay | Admitting: Neurology

## 2020-07-01 ENCOUNTER — Ambulatory Visit: Payer: Medicare Other | Admitting: Neurology

## 2020-07-13 ENCOUNTER — Ambulatory Visit: Payer: Medicare Other | Admitting: Family Medicine

## 2020-08-19 ENCOUNTER — Ambulatory Visit: Payer: Medicare Other | Admitting: Nurse Practitioner

## 2020-08-19 ENCOUNTER — Telehealth (INDEPENDENT_AMBULATORY_CARE_PROVIDER_SITE_OTHER): Payer: Medicare Other | Admitting: Nurse Practitioner

## 2020-08-19 DIAGNOSIS — I10 Essential (primary) hypertension: Secondary | ICD-10-CM | POA: Diagnosis not present

## 2020-08-19 DIAGNOSIS — Z8673 Personal history of transient ischemic attack (TIA), and cerebral infarction without residual deficits: Secondary | ICD-10-CM

## 2020-08-19 MED ORDER — METOPROLOL TARTRATE 100 MG PO TABS: 100.0000 mg | ORAL_TABLET | Freq: Two times a day (BID) | ORAL | 3 refills | Status: DC

## 2020-08-19 MED ORDER — AMLODIPINE BESYLATE 5 MG PO TABS
5.0000 mg | ORAL_TABLET | Freq: Every day | ORAL | 3 refills | Status: DC
Start: 2020-08-19 — End: 2020-12-27

## 2020-08-19 MED ORDER — ASPIRIN 81 MG PO CHEW: 81.0000 mg | CHEWABLE_TABLET | Freq: Every day | ORAL | 3 refills | Status: DC

## 2020-08-19 MED ORDER — AMLODIPINE BESYLATE 5 MG PO TABS: 5.0000 mg | ORAL_TABLET | Freq: Every day | ORAL | 3 refills | Status: DC

## 2020-08-19 MED ORDER — LEVETIRACETAM 500 MG PO TABS: 500.0000 mg | ORAL_TABLET | Freq: Two times a day (BID) | ORAL | 3 refills | Status: DC
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
## 2020-08-22 ENCOUNTER — Encounter: Payer: Self-pay | Admitting: Nurse Practitioner

## 2020-08-22 ENCOUNTER — Telehealth: Payer: Self-pay

## 2020-08-22 MED ORDER — LEVETIRACETAM 500 MG PO TABS: 500.0000 mg | ORAL_TABLET | Freq: Two times a day (BID) | ORAL | 3 refills | Status: DC

## 2020-08-22 NOTE — Telephone Encounter (Signed)
Medication was refill 5/20. Unable to reach patient.

## 2020-08-22 NOTE — Telephone Encounter (Signed)
Pt asking about her BP medication refill?

## 2020-09-09 ENCOUNTER — Telehealth: Payer: Self-pay | Admitting: Nurse Practitioner

## 2020-09-09 NOTE — Telephone Encounter (Signed)
Called several times no answer or VM never came on

## 2020-12-07 ENCOUNTER — Other Ambulatory Visit: Payer: Self-pay

## 2020-12-07 DIAGNOSIS — I10 Essential (primary) hypertension: Secondary | ICD-10-CM

## 2020-12-07 MED ORDER — ASPIRIN 81 MG PO CHEW: 81.0000 mg | CHEWABLE_TABLET | Freq: Every day | ORAL | 3 refills | Status: AC

## 2020-12-21 ENCOUNTER — Telehealth: Payer: Self-pay

## 2020-12-21 NOTE — Telephone Encounter (Signed)
Home health Assistance  Daughter Jahniyah Revere [phone]

## 2020-12-27 ENCOUNTER — Emergency Department (HOSPITAL_COMMUNITY): Payer: Medicare (Managed Care)

## 2020-12-27 ENCOUNTER — Emergency Department (HOSPITAL_COMMUNITY)
Admission: EM | Admit: 2020-12-27 | Discharge: 2020-12-27 | Disposition: A | Discharge status: Home / Self Care | Payer: Medicare (Managed Care) | Attending: Emergency Medicine | Admitting: Emergency Medicine

## 2020-12-27 ENCOUNTER — Encounter (HOSPITAL_COMMUNITY): Payer: Self-pay

## 2020-12-27 DIAGNOSIS — M25561 Pain in right knee: Secondary | ICD-10-CM | POA: Diagnosis not present

## 2020-12-27 DIAGNOSIS — Y9302 Activity, running: Secondary | ICD-10-CM | POA: Insufficient documentation

## 2020-12-27 DIAGNOSIS — W01198A Fall on same level from slipping, tripping and stumbling with subsequent striking against other object, initial encounter: Secondary | ICD-10-CM | POA: Diagnosis not present

## 2020-12-27 DIAGNOSIS — I10 Essential (primary) hypertension: Secondary | ICD-10-CM | POA: Insufficient documentation

## 2020-12-27 DIAGNOSIS — R000 Tachycardia, unspecified: Secondary | ICD-10-CM | POA: Diagnosis not present

## 2020-12-27 DIAGNOSIS — Z760 Encounter for issue of repeat prescription: Secondary | ICD-10-CM

## 2020-12-27 DIAGNOSIS — Y92019 Unspecified place in single-family (private) house as the place of occurrence of the external cause: Secondary | ICD-10-CM | POA: Diagnosis not present

## 2020-12-27 DIAGNOSIS — Z79899 Other long term (current) drug therapy: Secondary | ICD-10-CM | POA: Diagnosis not present

## 2020-12-27 DIAGNOSIS — M25551 Pain in right hip: Secondary | ICD-10-CM | POA: Diagnosis not present

## 2020-12-27 DIAGNOSIS — F17200 Nicotine dependence, unspecified, uncomplicated: Secondary | ICD-10-CM | POA: Insufficient documentation

## 2020-12-27 DIAGNOSIS — W19XXXA Unspecified fall, initial encounter: Secondary | ICD-10-CM

## 2020-12-27 LAB — CBC WITH DIFFERENTIAL/PLATELET
Abs Immature Granulocytes: 0.02 10*3/uL (ref 0.00–0.07)
Basophils Absolute: 0.1 10*3/uL (ref 0.0–0.1)
Basophils Relative: 1 %
Eosinophils Absolute: 0.1 10*3/uL (ref 0.0–0.5)
Eosinophils Relative: 1 %
HCT: 35.4 % — ABNORMAL LOW (ref 36.0–46.0)
Hemoglobin: 11.8 g/dL — ABNORMAL LOW (ref 12.0–15.0)
Immature Granulocytes: 0 %
Lymphocytes Relative: 34 %
Lymphs Abs: 2.9 10*3/uL (ref 0.7–4.0)
MCH: 29 pg (ref 26.0–34.0)
MCHC: 33.3 g/dL (ref 30.0–36.0)
MCV: 87 fL (ref 80.0–100.0)
Monocytes Absolute: 0.7 10*3/uL (ref 0.1–1.0)
Monocytes Relative: 8 %
Neutro Abs: 4.8 10*3/uL (ref 1.7–7.7)
Neutrophils Relative %: 56 %
Platelets: 259 10*3/uL (ref 150–400)
RBC: 4.07 MIL/uL (ref 3.87–5.11)
RDW: 14.5 % (ref 11.5–15.5)
WBC: 8.4 10*3/uL (ref 4.0–10.5)
nRBC: 0 % (ref 0.0–0.2)

## 2020-12-27 LAB — BASIC METABOLIC PANEL
Anion gap: 8 (ref 5–15)
BUN: 10 mg/dL (ref 8–23)
CO2: 27 mmol/L (ref 22–32)
Calcium: 9.8 mg/dL (ref 8.9–10.3)
Chloride: 111 mmol/L (ref 98–111)
Creatinine, Ser: 0.66 mg/dL (ref 0.44–1.00)
GFR, Estimated: >60 mL/min (ref >60)
Glucose, Bld: 130 mg/dL — ABNORMAL HIGH (ref 70–99)
Potassium: 3.2 mmol/L — ABNORMAL LOW (ref 3.5–5.1)
Sodium: 146 mmol/L — ABNORMAL HIGH (ref 135–145)

## 2020-12-27 MED ORDER — AMLODIPINE BESYLATE 5 MG PO TABS: 5.0000 mg | ORAL_TABLET | Freq: Every day | ORAL | 0 refills | Status: DC

## 2020-12-27 MED ORDER — ASPIRIN 81 MG PO CHEW
81.0000 mg | CHEWABLE_TABLET | Freq: Once | ORAL | Status: AC
  Administered 2020-12-27: 81 mg via ORAL
  Filled 2020-12-27: qty 1

## 2020-12-27 MED ORDER — AMLODIPINE BESYLATE 5 MG PO TABS
5.0000 mg | ORAL_TABLET | Freq: Once | ORAL | Status: AC
  Administered 2020-12-27: 5 mg via ORAL
  Filled 2020-12-27: qty 1

## 2020-12-27 MED ORDER — MORPHINE SULFATE (PF) 2 MG/ML IV SOLN
2.0000 mg | Freq: Once | INTRAVENOUS | Status: AC
  Administered 2020-12-27: 2 mg via INTRAVENOUS
  Filled 2020-12-27: qty 1

## 2020-12-27 MED ORDER — IBUPROFEN 800 MG PO TABS: 800.0000 mg | ORAL_TABLET | Freq: Every day | ORAL | 0 refills | Status: AC

## 2020-12-27 MED ORDER — METOPROLOL TARTRATE 100 MG PO TABS: 100.0000 mg | ORAL_TABLET | Freq: Two times a day (BID) | ORAL | 0 refills | Status: DC

## 2020-12-27 MED ORDER — SODIUM CHLORIDE 0.9 % IV BOLUS
500.0000 mL | Freq: Once | INTRAVENOUS | Status: AC
  Administered 2020-12-27: 500 mL via INTRAVENOUS

## 2020-12-27 MED ORDER — METOPROLOL TARTRATE 25 MG PO TABS
100.0000 mg | ORAL_TABLET | Freq: Once | ORAL | Status: AC
  Administered 2020-12-27: 100 mg via ORAL
  Filled 2020-12-27: qty 4

## 2020-12-27 NOTE — ED Triage Notes (Signed)
Per EMS-fell out of wheelchair yesterday-right sided weakness from previous stroke-complaining of right knee pain-no LOC, no blood thinners

## 2020-12-27 NOTE — ED Provider Notes (Signed)
[facility]-EMERGENCY DEPT Provider Note   CSN: [id number] Arrival date & time: 12/27/20  1310     History Chief Complaint  Patient presents with   Fall   Knee Pain    Carla Bauer is a 65 y.o. female.  HPI  Patient with significant medical history of CVA with residual right-sided weakness presents to the emergency department with chief complaint of a fall.  Patient states she was home and child was running around and bumped into her causing her to fall onto her right side.  She states that she hit her head but denies losing conscious, is not on anticoagulant.  She endorsed after the fall she was having some right-sided hip and knee pain, patient is not ambulatory but she states that when she moves her knee or hip she starts to have some pain.  She denies  paresthesia or weakness in her legs, denies urinary symptoms.  She denies headaches, change in vision, neck pain, back pain, chest pain, shortness of breath.  She has no other complaints at this time.  She has not taking anything for pain.  She also notes that she has not taken any of her medications, she not taken her metoprolol and/or her amlodipine as someone had stolen this from her, states she has been off the last couple days.  Past Medical History:  Diagnosis Date   Altered mental status    Hypertension    Insomnia    Right sided weakness    Seizure-like activity (HCC)    Stroke (cerebrum) (HCC)    Vitamin D deficiency 08/2019    Patient Active Problem List   Diagnosis Date Noted   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 08/21/2019   Decreased ambulation status 08/21/2019   Benign essential HTN    Hypernatremia    Dysphagia due to recent stroke    Spastic hemiparesis (HCC)    S/P percutaneous endoscopic gastrostomy (PEG) tube placement (HCC)    Labile blood glucose    Sinus tachycardia    Intraparenchymal hematoma of brain (HCC) 09/03/2017   Right hemiparesis (HCC)    Oropharyngeal  dysphagia    Essential hypertension    Acute lower UTI    Tachypnea    Tachycardia    Reactive hypertension    Prediabetes    Acute blood loss anemia    Hypokalemia    Fever    Leukocytosis    Acute respiratory failure with hypoxia (HCC)    Cerebrovascular accident (CVA) due to thrombosis of left posterior cerebral artery (HCC)    ICH (intracerebral hemorrhage) (HCC) 08/14/2017   SAH (subarachnoid hemorrhage) (HCC) 08/14/2017   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, 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   Hydrocephalus (HCC) 08/14/2017   Brain herniation (HCC) 08/14/2017   Cytotoxic brain edema (HCC) 08/14/2017    Past Surgical History:  Procedure Laterality Date   CESAREAN SECTION     CRANIOTOMY Left 08/14/2017   Procedure: 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;  Surgeon: Lisbeth Renshaw, MD;  Location: [facility] OR;  Service: Neurosurgery;  Laterality: Left;   IR GASTROSTOMY TUBE MOD SED  09/16/2017   IR GASTROSTOMY TUBE REMOVAL  12/06/2017   LAPAROSCOPIC REVISION VENTRICULAR-PERITONEAL (V-P) SHUNT N/A 08/29/2017   Procedure: LAPAROSCOPIC REVISION VENTRICULAR-PERITONEAL (V-P) SHUNT;  Surgeon: Abigail Miyamoto, MD;  Location: [facility] OR;  Service: General;  Laterality: N/A;  LAPAROSCOPIC REVISION VENTRICULAR-PERITONEAL (V-P) SHUNT   VENTRICULOPERITONEAL SHUNT Right 08/29/2017   Procedure: SHUNT INSERTION VENTRICULAR-PERITONEAL;  Surgeon: Conchita Paris,  Marlane Hatcher, MD;  Location: [facility] OR;  Service: Neurosurgery;  Laterality: Right;  SHUNT INSERTION VENTRICULAR-PERITONEAL     OB History   No obstetric history on file.     Family History  Problem Relation Age of Onset   Hypertension Mother     Social History   Tobacco Use   Smoking status: Never   Smokeless tobacco: Current    Types: Snuff  Vaping Use   Vaping Use: Never used  Substance Use Topics   Alcohol use: No   Drug use: No    Home Medications Prior to Admission medications    Medication Sig Start Date End Date Taking? Authorizing Provider  amLODipine (NORVASC) 5 MG tablet Take 1 tablet (5 mg total) by mouth daily. 12/27/20 01/26/21 Yes Carroll Sage, PA-C  aspirin 81 MG chewable tablet Chew 1 tablet (81 mg total) by mouth daily. 12/07/20 12/07/21 Yes Barbette Merino, NP  ibuprofen (ADVIL) 800 MG tablet Take 1 tablet (800 mg total) by mouth daily for 7 days. 12/27/20 01/03/21 Yes Carroll Sage, PA-C  metoprolol tartrate (LOPRESSOR) 100 MG tablet Take 1 tablet (100 mg total) by mouth 2 (two) times daily. 12/27/20 01/26/21 Yes Carroll Sage, PA-C  levETIRAcetam (KEPPRA) 500 MG tablet Take 1 tablet (500 mg total) by mouth 2 (two) times daily. Patient not taking: No sig reported 08/22/20   Barbette Merino, NP  Vitamin D, Ergocalciferol, (DRISDOL) 1.25 MG (50000 UNIT) CAPS capsule Take 1 capsule (50,000 Units total) by mouth every 7 (seven) days. Patient not taking: No sig reported 08/20/19   Kallie Locks, FNP    Allergies    Patient has no known allergies.  Review of Systems   Review of Systems  Constitutional:  Negative for chills and fever.  HENT:  Negative for congestion.   Respiratory:  Negative for shortness of breath.   Cardiovascular:  Negative for chest pain.  Gastrointestinal:  Negative for abdominal pain.  Genitourinary:  Negative for enuresis.  Musculoskeletal:  Negative for back pain.       Right hip and knee pain.  Skin:  Negative for rash.  Neurological:  Negative for dizziness.  Hematological:  Does not bruise/bleed easily.   Physical Exam Updated Vital Signs BP 115/76   Pulse 94   Temp 97.8 F (36.6 C) (Oral)   Resp 12   SpO2 99%   Physical Exam Vitals and nursing note reviewed.  Constitutional:      General: She is not in acute distress.    Appearance: She is not ill-appearing.  HENT:     Head: Normocephalic and atraumatic.     Comments: No deformity of the head present, no raccoon eyes or battle sign present, patient  does have noted VP shunt right parietal lobe.    Nose: No congestion.     Mouth/Throat:     Mouth: Mucous membranes are moist.     Pharynx: Oropharynx is clear. No oropharyngeal exudate or posterior oropharyngeal erythema.     Comments: No trismus, torticollis, no obvious dental trauma present Eyes:     Extraocular Movements: Extraocular movements intact.     Conjunctiva/sclera: Conjunctivae normal.     Pupils: Pupils are equal, round, and reactive to light.  Cardiovascular:     Rate and Rhythm: Regular rhythm. Tachycardia present.     Pulses: Normal pulses.     Heart sounds: No murmur heard.   No friction rub. No gallop.  Pulmonary:     Effort: No respiratory distress.  Breath sounds: No wheezing, rhonchi or rales.  Chest:     Chest wall: No tenderness.  Abdominal:     Palpations: Abdomen is soft.     Tenderness: There is no abdominal tenderness. There is no right CVA tenderness or left CVA tenderness.  Musculoskeletal:     Comments: Patient has noted contracture of the right upper extremity, notable right-sided weakness, patient has normal 5 of 5 strength in the left upper and lower extremity, neurovascular fully intact in all 4 extremities.  Patient has slight tenderness on her right knee no gross abnormalities present, no joint laxity, no focal tenderness present.    Skin:    General: Skin is warm and dry.  Neurological:     Mental Status: She is alert.     Comments: No facial asymmetry, no difficulty with word finding, no slurring of her words, able to follow two-step commands, noted right-sided weakness this appears to be at baseline from prior stroke.  Psychiatric:        Mood and Affect: Mood normal.    ED Results / Procedures / Treatments   Labs (all labs ordered are listed, but only abnormal results are displayed) Labs Reviewed  BASIC METABOLIC PANEL - Abnormal; Notable for the following components:      Result Value   Sodium 146 (*)    Potassium 3.2 (*)     Glucose, Bld 130 (*)    All other components within normal limits  CBC WITH DIFFERENTIAL/PLATELET - Abnormal; Notable for the following components:   Hemoglobin 11.8 (*)    HCT 35.4 (*)    All other components within normal limits    EKG EKG Interpretation  Date/Time:  Tuesday December 27 2020 16:48:57 EDT Ventricular Rate:  125 PR Interval:  146 QRS Duration: 93 QT Interval:  323 QTC Calculation: 466 R Axis:   36 Text Interpretation: Sinus tachycardia Probable left atrial enlargement Borderline repolarization abnormality Artifact in lead(s) I II III aVR aVL aVF V1 Confirmed by Ernie Avena ([id number]) on 12/27/2020 5:00:36 PM  Radiology DG Chest Portable 1 View  Result Date: 12/27/2020 CLINICAL DATA:  Tachycardia. EXAM: PORTABLE CHEST 1 VIEW COMPARISON:  06/28/2018 FINDINGS: Stable right-sided ventriculoperitoneal shunt catheter. The cardiac silhouette, mediastinal and hilar contours are within normal limits given the AP projection and portable technique. Mild tortuosity of the thoracic aorta is again noted. The lungs are clear. No pleural effusions. No pulmonary lesions. The bony thorax is intact. IMPRESSION: No acute cardiopulmonary findings. Electronically Signed   By: Rudie Meyer M.D.   On: 12/27/2020 19:01   DG Knee Complete 4 Views Right  Result Date: 12/27/2020 CLINICAL DATA:  Larey Seat out of wheelchair yesterday. EXAM: RIGHT KNEE - COMPLETE 4+ VIEW COMPARISON:  None. FINDINGS: Limited examination due to positioning. No obvious acute fracture large joint effusion. IMPRESSION: Limited examination due to positioning. No obvious acute fracture. Electronically Signed   By: Rudie Meyer M.D.   On: 12/27/2020 15:42   DG Hip Unilat W or Wo Pelvis 2-3 Views Right  Result Date: 12/27/2020 CLINICAL DATA:  Larey Seat out of wheelchair. EXAM: DG HIP (WITH OR WITHOUT PELVIS) 2-3V RIGHT COMPARISON:  06/28/2018 FINDINGS: Both hips are normally located. No acute hip fracture. The pubic symphysis and SI  joints are intact. No pelvic fractures. IMPRESSION: No acute bony findings. Electronically Signed   By: Rudie Meyer M.D.   On: 12/27/2020 15:42    Procedures Procedures   Medications Ordered in ED Medications  metoprolol tartrate (LOPRESSOR) tablet  100 mg (100 mg Oral Given 12/27/20 1732)  amLODipine (NORVASC) tablet 5 mg (5 mg Oral Given 12/27/20 1735)  sodium chloride 0.9 % bolus 500 mL (500 mLs Intravenous New Bag/Given 12/27/20 1749)  morphine 2 MG/ML injection 2 mg (2 mg Intravenous Given 12/27/20 1749)  aspirin chewable tablet 81 mg (81 mg Oral Given 12/27/20 1758)    ED Course  I have reviewed the triage vital signs and the nursing notes.  Pertinent labs & imaging results that were available during my care of the patient were reviewed by me and considered in my medical decision making (see chart for details).    MDM Rules/Calculators/A&P                          Initial impression-patient presents after a fall.She is alert, does not appear acute distress, vital signs noted for tachycardia of 133, second heart rate is 117, triage obtain basic imaging of knee and hip, will add on basic lab work-up, EKG, chest x-ray provide patient pain medication, fluids, also provide her with Metroprolol which she did not take today.  Work-up-CBC shows Normocytic anemia hemoglobin 11.8 at baseline for patient, BMP shows sodium of 146, potassium 3.2, glucose is 130 chest x-ray unremarkable.  X-ray of her right hip and knee both negative for acute abnormalities.  EKG sinus tach without signs of ischemia.  Reassessment-patient reassessed after fluids, pain medication, amlodipine and metoprolol states she is feeling much better, has no complaints, vital signs have improved no longer tachycardic or hypertensive.  Patient treated for discharge.  Rule out- low suspicion for intracranial head bleed as patient denies loss of conscious, is not on anticoagulant, she does not endorse headaches,  paresthesia/weakness in the upper and lower extremities, no new focal deficits present on my exam.  Head CT will be deferred.  Low suspicion for spinal cord abnormality or spinal fracture spine was palpated was nontender to palpation, patient has full range of motion in the upper and lower extremities.  Low suspicion for pneumothorax as lung sounds are clear bilaterally, chest nontender palpation, will defer imaging at this time.  Low suspicion for intra-abdominal trauma as abdomen soft nontender to palpation.  Low suspicion for orthopedic injury as imaging is negative for acute findings.  Low suspicion for ACS as presentation is atypical, no ischemic changes seen on EKG.  Low suspicion for PE as patient has pleuritic chest pain, shortness of breath, no unilateral leg swelling, patient has low risk factors.  She was noted be tachycardic but suspect this is likely due to not taking her medications and slight dehydrated this improved after providing fluids and her home medications.   Plan-  Knee pain-likely muscular strain, will have her continue with home medications, follow-up with PCP as needed. Medication refill -patient is currently out of her metoprolol and amlodipine we will refill this, have her follow-up with PCP for further evaluation.  Vital signs have remained stable, no indication for hospital admission.  Patient discussed with attending and they agreed with assessment and plan.  Patient given at home care as well strict return precautions.  Patient verbalized that they understood agreed to said plan.  Final Clinical Impression(s) / ED Diagnoses Final diagnoses:  Fall, initial encounter  Medication refill    Rx / DC Orders ED Discharge Orders          Ordered    amLODipine (NORVASC) 5 MG tablet  Daily  12/27/20 1917    metoprolol tartrate (LOPRESSOR) 100 MG tablet  2 times daily        12/27/20 1917    ibuprofen (ADVIL) 800 MG tablet  Daily        12/27/20 1918              Barnie Del 12/27/20 1918    Ernie Avena, MD 12/28/20 0130

## 2020-12-27 NOTE — ED Provider Notes (Signed)
Emergency Medicine Provider Triage Evaluation Note  Carla Bauer , a 65 y.o. female  was evaluated in triage.  Pt complains of fall PTA. States her nephew pulled her out of her wheelchair and she fell onto the ground. She is c/o right hip and right knee pain. Denies head trauma.  Review of Systems  Positive: Right knee pain, right hip pain Negative: Head injury, loc  Physical Exam  BP 140/85 (BP Location: Left Arm)   Pulse (!) 133   Temp 97.8 F (36.6 C) (Oral)   Resp 14   SpO2 100%  Gen:   Awake, no distress   Resp:  Normal effort  MSK:   Moves extremities without difficulty  Other:  Ttp to the right hip  Medical Decision Making  Medically screening exam initiated at 1:29 PM.  Appropriate orders placed.  Arvid Right was informed that the remainder of the evaluation will be completed by another provider, this initial triage assessment does not replace that evaluation, and the importance of remaining in the ED until their evaluation is complete.     Rayne Du 12/27/20 1331    Glendora Score, MD 12/27/20 1755

## 2020-12-27 NOTE — ED Notes (Signed)
Patients daughter would like a call back: [name] [phone] Would like information on placement stated that her mother does not have anywhere to go.  Other sister is [name] [phone]

## 2020-12-27 NOTE — Discharge Instructions (Addendum)
Lab work and imaging are all reassuring.  Refilled your medications please take as prescribed.  Off continue ibuprofen please take as prescribed.  Follow-up with your PCP as needed.  Come back to the emergency department if you develop chest pain, shortness of breath, severe abdominal pain, uncontrolled nausea, vomiting, diarrhea.

## 2020-12-28 ENCOUNTER — Emergency Department (HOSPITAL_COMMUNITY)
Admission: EM | Admit: 2020-12-28 | Discharge: 2020-12-28 | Disposition: A | Discharge status: Home / Self Care | Payer: Medicare (Managed Care) | Attending: Emergency Medicine | Admitting: Emergency Medicine

## 2020-12-28 ENCOUNTER — Encounter (HOSPITAL_COMMUNITY): Payer: Self-pay

## 2020-12-28 DIAGNOSIS — Z5321 Procedure and treatment not carried out due to patient leaving prior to being seen by health care provider: Secondary | ICD-10-CM | POA: Insufficient documentation

## 2020-12-28 DIAGNOSIS — M79606 Pain in leg, unspecified: Secondary | ICD-10-CM | POA: Insufficient documentation

## 2020-12-28 DIAGNOSIS — Z760 Encounter for issue of repeat prescription: Secondary | ICD-10-CM | POA: Diagnosis not present

## 2020-12-28 DIAGNOSIS — Z765 Malingerer [conscious simulation]: Secondary | ICD-10-CM

## 2020-12-28 NOTE — ED Notes (Signed)
This Clinical research associate asked pt if she needed assistance with transportation. Pt states she called a cab at this time and is all set.

## 2020-12-28 NOTE — ED Triage Notes (Signed)
Pt c/o leg pain. States she was just discharged but hasn't been able to get home to get medications.

## 2020-12-28 NOTE — ED Provider Notes (Signed)
MSE was initiated and I personally evaluated the patient and placed orders (if any) at  3:22 AM on December 28, 2020.  The patient appears stable so that the remainder of the MSE may be completed by another provider.  Patient never left the waiting room after discharge and is checking back in only to get pain medications.  She was given prescriptions at the time of discharge.     Layton Naves, Jonny Ruiz, MD 12/28/20 [phone]

## 2021-01-04 ENCOUNTER — Other Ambulatory Visit: Payer: Self-pay

## 2021-01-04 ENCOUNTER — Encounter: Payer: Self-pay | Admitting: Nurse Practitioner

## 2021-01-04 ENCOUNTER — Ambulatory Visit (INDEPENDENT_AMBULATORY_CARE_PROVIDER_SITE_OTHER): Payer: Medicaid Other | Admitting: Nurse Practitioner

## 2021-01-04 VITALS — BP 128/74 | HR 88 | Temp 97.7°F | Ht 61.0 in | Wt 155.0 lb

## 2021-01-04 DIAGNOSIS — Z8673 Personal history of transient ischemic attack (TIA), and cerebral infarction without residual deficits: Secondary | ICD-10-CM | POA: Diagnosis not present

## 2021-01-04 DIAGNOSIS — Z7409 Other reduced mobility: Secondary | ICD-10-CM | POA: Diagnosis not present

## 2021-01-04 DIAGNOSIS — I10 Essential (primary) hypertension: Secondary | ICD-10-CM | POA: Diagnosis not present

## 2021-01-04 DIAGNOSIS — R531 Weakness: Secondary | ICD-10-CM | POA: Diagnosis not present

## 2021-01-04 MED ORDER — ROLLATOR ULTRA-LIGHT MISC
1.0000 | Freq: Every day | 0 refills | Status: AC
Start: 2021-01-04 — End: ?

## 2021-01-04 NOTE — Progress Notes (Signed)
[name] [address] [place], [place]  [address] Phone:  [phone]   Fax:  [phone]    Established Patient Office Visit  Subjective:  Patient ID: Carla Bauer, female    DOB: [birth year]  Age: 65 y.o. MRN: [id number]  CC:  Chief Complaint  Patient presents with  . Follow-up    Pt is here today for home health assistance. Pt daughter states that the patient(mom) is needing some rehabilitation or physical therapy due to her stroke. Pt daughter states that they don't have their own place right now and wants her mom to be able to go somewhere where she can get taken care of.    HPI Carla Bauer presents for face to face. She  has a past medical history of Altered mental status, Hypertension, Insomnia, Right sided weakness, Seizure-like activity (HCC), Stroke (cerebrum) (HCC), and Vitamin D deficiency (08/2019).   She is in today with her family.  They report that she is having right-sided weakness.  She did suffer a fall x2 in last few weeks.  She was seen in the ED and released.  She has a wheelchair and a Rollator however the Rollator is broken.  Her daughter is wanting her to be placed in rehabilitation.  They are currently living out of a hotel and have been for several months.  She has history of hypertension is currently on amlodipine 5 mg along with metoprolol 100 mg.  She is doing well with this.  [facility] to her room.   Past Medical History:  Diagnosis Date  . Altered mental status   . Hypertension   . Insomnia   . Right sided weakness   . Seizure-like activity (HCC)   . Stroke (cerebrum) (HCC)   . Vitamin D deficiency 08/2019    Past Surgical History:  Procedure Laterality Date  . CESAREAN SECTION    . CRANIOTOMY Left 08/14/2017   Procedure: 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;  Surgeon: Lisbeth Renshaw, MD;  Location: [facility] OR;  Service: Neurosurgery;  Laterality: Left;  . IR  GASTROSTOMY TUBE MOD SED  09/16/2017  . IR GASTROSTOMY TUBE REMOVAL  12/06/2017  . LAPAROSCOPIC REVISION VENTRICULAR-PERITONEAL (V-P) SHUNT N/A 08/29/2017   Procedure: LAPAROSCOPIC REVISION VENTRICULAR-PERITONEAL (V-P) SHUNT;  Surgeon: Abigail Miyamoto, MD;  Location: [facility] OR;  Service: General;  Laterality: N/A;  LAPAROSCOPIC REVISION VENTRICULAR-PERITONEAL (V-P) SHUNT  . VENTRICULOPERITONEAL SHUNT Right 08/29/2017   Procedure: SHUNT INSERTION VENTRICULAR-PERITONEAL;  Surgeon: Lisbeth Renshaw, MD;  Location: [facility] OR;  Service: Neurosurgery;  Laterality: Right;  SHUNT INSERTION VENTRICULAR-PERITONEAL    Family History  Problem Relation Age of Onset  . Hypertension Mother     Social History   Socioeconomic History  . Marital status: Single    Spouse name: Not on file  . Number of children: Not on file  . Years of education: Not on file  . Highest education level: Not on file  Occupational History  . Not on file  Tobacco Use  . Smoking status: Never  . Smokeless tobacco: Current    Types: Snuff  Vaping Use  . Vaping Use: Never used  Substance and Sexual Activity  . Alcohol use: No  . Drug use: No  . Sexual activity: Not Currently  Other Topics Concern  . Not on file  Social History Narrative  . Not on file   Social Determinants of Health   Financial Resource Strain: Not on file  Food Insecurity: Not  on file  Transportation Needs: Not on file  Physical Activity: Not on file  Stress: Not on file  Social Connections: Not on file  Intimate Partner Violence: Not on file    Outpatient Medications Prior to Visit  Medication Sig Dispense Refill  . amLODipine (NORVASC) 5 MG tablet Take 1 tablet (5 mg total) by mouth daily. 30 tablet 0  . aspirin 81 MG chewable tablet Chew 1 tablet (81 mg total) by mouth daily. 90 tablet 3  . metoprolol tartrate (LOPRESSOR) 100 MG tablet Take 1 tablet (100 mg total) by mouth 2 (two) times daily. 60 tablet 0   No facility-administered medications  prior to visit.    No Known Allergies  ROS Review of Systems    Objective:    Physical Exam Constitutional:      General: She is not in acute distress.    Appearance: She is not toxic-appearing.  HENT:     Head: Normocephalic and atraumatic.     Nose: Nose normal.     Mouth/Throat:     Mouth: Mucous membranes are moist.  Cardiovascular:     Rate and Rhythm: Normal rate.     Pulses: Normal pulses.     Heart sounds: Normal heart sounds.  Pulmonary:     Effort: Pulmonary effort is normal.     Breath sounds: Normal breath sounds.  Abdominal:     Palpations: Abdomen is soft.  Musculoskeletal:     Right lower leg: No edema.     Left lower leg: No edema.     Comments: Sitting on Rollator walker Right hand contracture Right leg weakness  Skin:    General: Skin is warm.     Capillary Refill: Capillary refill takes less than 2 seconds.  Neurological:     General: No focal deficit present.     Mental Status: She is alert and oriented to person, place, and time.  Psychiatric:        Mood and Affect: Mood normal.        Behavior: Behavior normal.        Thought Content: Thought content normal.   BP 128/74   Pulse 88   Temp 97.7 F (36.5 C)   Ht 5\' 1"  (1.549 m)   Wt 155 lb (70.3 kg)   SpO2 100%   BMI 29.29 kg/m  Wt Readings from Last 3 Encounters:  01/04/21 155 lb (70.3 kg)  12/28/20 160 lb (72.6 kg)  04/09/20 160 lb (72.6 kg)     Health Maintenance Due  Topic Date Due  . COVID-19 Vaccine (1) Never done  . Hepatitis C Screening  Never done  . TETANUS/TDAP  Never done  . COLONOSCOPY (Pts 45-36yrs Insurance coverage will need to be confirmed)  Never done  . MAMMOGRAM  Never done  . Zoster Vaccines- Shingrix (1 of 2) Never done  . DEXA SCAN  Never done  . INFLUENZA VACCINE  Never done    There are no preventive care reminders to display for this patient.  Lab Results  Component Value Date   TSH 1.800 08/19/2019   Lab Results  Component Value Date   WBC  8.4 12/27/2020   HGB 11.8 (L) 12/27/2020   HCT 35.4 (L) 12/27/2020   MCV 87.0 12/27/2020   PLT 259 12/27/2020   Lab Results  Component Value Date   NA 146 (H) 12/27/2020   K 3.2 (L) 12/27/2020   CO2 27 12/27/2020   GLUCOSE 130 (H) 12/27/2020   BUN  10 12/27/2020   CREATININE 0.66 12/27/2020   BILITOT 0.5 04/09/2020   ALKPHOS 68 04/09/2020   AST 17 04/09/2020   ALT 15 04/09/2020   PROT 6.7 04/09/2020   ALBUMIN 3.3 (L) 04/09/2020   CALCIUM 9.8 12/27/2020   ANIONGAP 8 12/27/2020   Lab Results  Component Value Date   CHOL 145 08/19/2019   Lab Results  Component Value Date   HDL 44 08/19/2019   Lab Results  Component Value Date   LDLCALC 81 08/19/2019   Lab Results  Component Value Date   TRIG 109 08/19/2019   Lab Results  Component Value Date   CHOLHDL 3.3 08/19/2019   Lab Results  Component Value Date   HGBA1C 7.4 (A) 08/19/2019      Assessment & Plan:   Problem List Items Addressed This Visit   None   No orders of the defined types were placed in this encounter.   Follow-up: No follow-ups on file.    Barbette Merino, NP

## 2021-01-06 ENCOUNTER — Encounter: Payer: Self-pay | Admitting: Nurse Practitioner

## 2021-01-06 NOTE — Patient Instructions (Signed)

## 2021-02-01 ENCOUNTER — Telehealth: Payer: Self-pay

## 2021-02-01 ENCOUNTER — Other Ambulatory Visit: Payer: Self-pay

## 2021-02-01 DIAGNOSIS — I10 Essential (primary) hypertension: Secondary | ICD-10-CM

## 2021-02-01 MED ORDER — METOPROLOL TARTRATE 100 MG PO TABS: 100.0000 mg | ORAL_TABLET | Freq: Two times a day (BID) | ORAL | 0 refills | Status: DC

## 2021-02-01 MED ORDER — AMLODIPINE BESYLATE 5 MG PO TABS: 5.0000 mg | ORAL_TABLET | Freq: Every day | ORAL | 0 refills | Status: DC

## 2021-02-01 NOTE — Telephone Encounter (Signed)
Rx refills have been sent. 

## 2021-02-01 NOTE — Telephone Encounter (Signed)
Bp med's to be filled   [facility]

## 2021-02-07 ENCOUNTER — Telehealth: Payer: Self-pay

## 2021-02-07 NOTE — Telephone Encounter (Signed)
Bp medicine refill

## 2021-02-09 NOTE — Telephone Encounter (Signed)
Pt medications were sent to her pharmacy([facility]) on 02/01/21 with a 90 day supply.

## 2021-04-06 ENCOUNTER — Ambulatory Visit: Payer: Medicaid Other | Admitting: Nurse Practitioner

## 2021-06-13 ENCOUNTER — Other Ambulatory Visit: Payer: Self-pay

## 2021-06-13 ENCOUNTER — Telehealth: Payer: Self-pay | Admitting: Clinical

## 2021-06-13 DIAGNOSIS — I10 Essential (primary) hypertension: Secondary | ICD-10-CM

## 2021-06-13 NOTE — Telephone Encounter (Signed)
[facility] ?Case Management Referral Note ? ?06/13/2021 ?Name: SHENEIKA WALSTAD MRN: [id number] DOB: 1955-08-22 ?JUANITTA EARNHARDT is a 66 y.o. year old female who sees Barbette Merino, NP for primary care. LCSW was consulted to assess patient's needs and assist the patient with Transportation Needs . ? ?Interpreter: No.   Interpreter Name & Language: none ? ?Assessment: Patient experiencing Transportation barriers.  ? ?Intervention: Patient was transferred to CSW line from front desk. Patient indicated she does not have transportation for her appointment this Friday, 3/17.  ? ?Patient has active Medicaid. Called DSS and confirmed she is eligible for transportation services but they need to do a new assessment as hers expired last year. They will call patient to complete assessment. CSW following up with [facility] Transportation department for assistance in scheduling transport for this Friday, as DSS will likely not be able to schedule for this week due to expired assessment.  ? ?Review of patient status, including review of consultants reports, relevant laboratory and other test results, and collaboration with appropriate care team members and the patient's provider was performed as part of comprehensive patient evaluation and provision of services.   ? ?Abigail Butts, LCSW ?Patient Care Center ?[facility] ?[phone] ?  ? ?

## 2021-06-14 MED ORDER — METOPROLOL TARTRATE 100 MG PO TABS: 100.0000 mg | ORAL_TABLET | Freq: Two times a day (BID) | ORAL | 0 refills | Status: DC

## 2021-06-16 ENCOUNTER — Telehealth: Payer: Self-pay

## 2021-06-16 ENCOUNTER — Ambulatory Visit (INDEPENDENT_AMBULATORY_CARE_PROVIDER_SITE_OTHER): Payer: Medicaid Other | Admitting: Nurse Practitioner

## 2021-06-16 ENCOUNTER — Other Ambulatory Visit: Payer: Self-pay

## 2021-06-16 ENCOUNTER — Encounter: Payer: Self-pay | Admitting: Nurse Practitioner

## 2021-06-16 VITALS — BP 139/81 | HR 87 | Temp 98.9°F | Ht 61.0 in | Wt 153.8 lb

## 2021-06-16 DIAGNOSIS — I10 Essential (primary) hypertension: Secondary | ICD-10-CM

## 2021-06-16 DIAGNOSIS — R531 Weakness: Secondary | ICD-10-CM | POA: Diagnosis not present

## 2021-06-16 DIAGNOSIS — Z8673 Personal history of transient ischemic attack (TIA), and cerebral infarction without residual deficits: Secondary | ICD-10-CM | POA: Diagnosis not present

## 2021-06-16 DIAGNOSIS — Z1159 Encounter for screening for other viral diseases: Secondary | ICD-10-CM | POA: Diagnosis not present

## 2021-06-16 DIAGNOSIS — Z9189 Other specified personal risk factors, not elsewhere classified: Secondary | ICD-10-CM

## 2021-06-16 MED ORDER — METOPROLOL TARTRATE 100 MG PO TABS: 100.0000 mg | ORAL_TABLET | Freq: Two times a day (BID) | ORAL | 3 refills | Status: DC

## 2021-06-16 MED ORDER — AMLODIPINE BESYLATE 5 MG PO TABS: 5.0000 mg | ORAL_TABLET | Freq: Every day | ORAL | 3 refills | Status: DC

## 2021-06-16 NOTE — Progress Notes (Signed)
? ?[facility] ?[address] ?[place], [place]  [address] ?Phone:  [phone]   Fax:  [phone] ? ? ?Established Patient Office Visit ? ?Subjective:  ?Patient ID: Carla Bauer, female    DOB: 1955/12/03  Age: 66 y.o. MRN: [id number] ? ?CC:  ?Chief Complaint  ?Patient presents with  ? Follow-up  ?  Pt is here for 3 month follow up. Pt stated she would like physical therapy and crutches. Pt is requesting refills on medications  ? ? ?HPI ?Carla Bauer presents for follow up. She  has a past medical history of Altered mental status, Hypertension, Insomnia, [name], Seizure-like activity (HCC), Stroke (cerebrum) (HCC), and Vitamin D deficiency (08/2019).  ? ?Carla Bauer is in today for follow up for Hypertension. The current prescribed treatment is amlodipine  Compliance is reported but home blood pressure monitoring is not done The  DASH diet is somewhat being followed. An exercise regimen is not ongoing due to weakness. She does however desire to receive PT so that she can ambulate with a crutch. She feels like she can do this. Her family desired intense rehabilitation however this has not been completed.  There is a goal to increase activity in the home. Denies headache, dizziness, visual changes, shortness of breath, dyspnea on exertion, chest pain, nausea, vomiting or any edema. She denies any seizure activity.  ? ? ?Past Medical History:  ?Diagnosis Date  ? Altered mental status   ? Hypertension   ? Insomnia   ? [name]   ? Seizure-like activity (HCC)   ? Stroke (cerebrum) (HCC)   ? Vitamin D deficiency 08/2019  ? ? ?Past Surgical History:  ?Procedure Laterality Date  ? CESAREAN SECTION    ? CRANIOTOMY Left 08/14/2017  ? 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 IR GASTROSTOMY TUBE MOD SED  09/16/2017  ? IR GASTROSTOMY TUBE REMOVAL   12/06/2017  ? LAPAROSCOPIC REVISION VENTRICULAR-PERITONEAL (V-P) SHUNT N/A 08/29/2017  ? Procedure: LAPAROSCOPIC REVISION VENTRICULAR-PERITONEAL (V-P) SHUNT;  Surgeon: Abigail Miyamoto, MD;  Location: [facility] OR;  Service: General;  Laterality: N/A;  LAPAROSCOPIC REVISION VENTRICULAR-PERITONEAL (V-P) SHUNT  ? VENTRICULOPERITONEAL SHUNT Bauer 08/29/2017  ? Procedure: SHUNT INSERTION VENTRICULAR-PERITONEAL;  Surgeon: Lisbeth Renshaw, MD;  Location: [facility] OR;  Service: Neurosurgery;  Laterality: Bauer;  SHUNT INSERTION VENTRICULAR-PERITONEAL  ? ? ?Family History  ?Problem Relation Age of Onset  ? Hypertension Mother   ? ? ?Social History  ? ?Socioeconomic History  ? Marital status: Single  ?  Spouse name: Not on file  ? Number of children: Not on file  ? Years of education: Not on file  ? Highest education level: Not on file  ?Occupational History  ? Not on file  ?Tobacco Use  ? Smoking status: Never  ? Smokeless tobacco: Current  ?  Types: Snuff  ?Vaping Use  ? Vaping Use: Never used  ?Substance and Sexual Activity  ? Alcohol use: No  ? Drug use: No  ? Sexual activity: Not Currently  ?Other Topics Concern  ? Not on file  ?Social History Narrative  ? Not on file  ? ?Social Determinants of Health  ? ?Financial Resource Strain: Not on file  ?Food Insecurity: Not on file  ?Transportation Needs: Unmet Transportation Needs  ? Lack of Transportation (Medical): Yes  ? Lack of Transportation (Non-Medical): Yes  ?Physical Activity: Not  on file  ?Stress: Not on file  ?Social Connections: Not on file  ?Intimate Partner Violence: Not on file  ? ? ?Outpatient Medications Prior to Visit  ?Medication Sig Dispense Refill  ? aspirin 81 MG chewable tablet Chew 1 tablet (81 mg total) by mouth daily. 90 tablet 3  ? metoprolol tartrate (LOPRESSOR) 100 MG tablet Take 1 tablet (100 mg total) by mouth 2 (two) times daily. 180 tablet 0  ? Misc. Devices (ROLLATOR ULTRA-LIGHT) MISC 1 application by Does not apply route daily. 1 each 0  ? amLODipine  (NORVASC) 5 MG tablet Take 1 tablet (5 mg total) by mouth daily. 90 tablet 0  ? ?No facility-administered medications prior to visit.  ? ? ?No Known Allergies ? ?ROS ?Review of Systems ? ?  ?Objective:  ?  ?Physical Exam ?Constitutional:   ?   General: She is not in acute distress. ?   Appearance: She is obese. She is not toxic-appearing.  ?HENT:  ?   Head: Normocephalic and atraumatic.  ?   Nose: Nose normal.  ?   Mouth/Throat:  ?   Mouth: Mucous membranes are moist.  ?Cardiovascular:  ?   Rate and Rhythm: Normal rate.  ?   Pulses: Normal pulses.  ?   Heart sounds: Normal heart sounds.  ?Pulmonary:  ?   Effort: Pulmonary effort is normal.  ?   Breath sounds: Normal breath sounds.  ?Abdominal:  ?   Palpations: Abdomen is soft.  ?Musculoskeletal:  ?   Cervical back: Normal range of motion.  ?   Bauer lower leg: No edema.  ?   Left lower leg: No edema.  ?   Comments: Sitting on Rollator walker ?Bauer hand contracture ?Bauer leg weakness has improved slightly.  ?Skin: ?   General: Skin is warm.  ?   Capillary Refill: Capillary refill takes less than 2 seconds.  ?Neurological:  ?   General: No focal deficit present.  ?   Mental Status: She is alert and oriented to person, place, and time.  ?Psychiatric:     ?   Mood and Affect: Mood normal.     ?   Behavior: Behavior normal.     ?   Thought Content: Thought content normal.  ? ? ?BP 139/81   Pulse 87   Temp 98.9 ?F (37.2 ?C)   Ht 5\' 1"  (1.549 m)   Wt 153 lb 12.8 oz (69.8 kg)   SpO2 100%   BMI 29.06 kg/m?  ?Wt Readings from Last 3 Encounters:  ?06/16/21 153 lb 12.8 oz (69.8 kg)  ?01/04/21 155 lb (70.3 kg)  ?12/28/20 160 lb (72.6 kg)  ? ? ? ?Health Maintenance Due  ?Topic Date Due  ? COVID-19 Vaccine (1) Never done  ? TETANUS/TDAP  Never done  ? MAMMOGRAM  Never done  ? Pneumonia Vaccine 6565+ Years old (1 - PCV) Never done  ? DEXA SCAN  Never done  ? ? ?There are no preventive care reminders to display for this patient. ? ?Lab Results  ?Component Value Date  ? TSH  1.800 08/19/2019  ? ?Lab Results  ?Component Value Date  ? WBC 8.4 12/27/2020  ? HGB 11.8 (L) 12/27/2020  ? HCT 35.4 (L) 12/27/2020  ? MCV 87.0 12/27/2020  ? PLT 259 12/27/2020  ? ?Lab Results  ?Component Value Date  ? NA 146 (H) 12/27/2020  ? K 3.2 (L) 12/27/2020  ? CO2 27 12/27/2020  ? GLUCOSE 130 (H) 12/27/2020  ? BUN 10  12/27/2020  ? CREATININE 0.66 12/27/2020  ? BILITOT 0.5 04/09/2020  ? ALKPHOS 68 04/09/2020  ? AST 17 04/09/2020  ? ALT 15 04/09/2020  ? PROT 6.7 04/09/2020  ? ALBUMIN 3.3 (L) 04/09/2020  ? CALCIUM 9.8 12/27/2020  ? ANIONGAP 8 12/27/2020  ? ?Lab Results  ?Component Value Date  ? CHOL 145 08/19/2019  ? ?Lab Results  ?Component Value Date  ? HDL 44 08/19/2019  ? ?Lab Results  ?Component Value Date  ? LDLCALC 81 08/19/2019  ? ?Lab Results  ?Component Value Date  ? TRIG 109 08/19/2019  ? ?Lab Results  ?Component Value Date  ? CHOLHDL 3.3 08/19/2019  ? ? ? ?  ?Assessment & Plan:  ? ?Problem List Items Addressed This Visit   ? ?  ? Cardiovascular and Mediastinum  ? Essential hypertension - Primary ?Stable  ?Encouraged on going compliance with current medication regimen ?Encouraged home monitoring and recording BP <130/80 ?Eating a heart-healthy diet with less salt ?Encouraged regular physical activity  ?Recommend Weight loss  ? Relevant Medications  ? metoprolol tartrate (LOPRESSOR) 100 MG tablet  ? amLODipine (NORVASC) 5 MG tablet  ? Other Relevant Orders  ? Lipid panel  ? Comp. Metabolic Panel (12)  ?  ? Other  ? 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 Relevant Orders  ? Ambulatory referral to Physical Therapy  ?Evaluation for device to assist with ambulation   ? Brace foot drop brace  ? ?Other Visit Diagnoses   ? ? [name]      ? Encounter for hepatitis C virus screening test for high risk patient      ? Relevant Orders  ? Hepatitis C antibody  ? ?  ? ? ?Meds ordered this encounter  ?Medications  ? metoprolol tartrate (LOPRESSOR) 100 MG tablet  ?  Sig: Take 1 tablet (100 mg total)  by mouth 2 (two) times daily.  ?  Dispense:  180 tablet  ?  Refill:  3  ?  Order Specific Question:   Supervising Provider  ?  AnswerQuentin Angst [[id number]]  ? amLODipine (NORVASC) 5 MG tablet  ?  Sig

## 2021-06-16 NOTE — Patient Instructions (Signed)
Managing Your Hypertension Hypertension, also called high blood pressure, is when the force of the blood pressing against the walls of the arteries is too strong. Arteries are blood vessels that carry blood from your heart throughout your body. Hypertension forces the heart to work harder to pump blood and may cause the arteries to become narrow or stiff. Understanding blood pressure readings Your personal target blood pressure may vary depending on your medical conditions, your age, and other factors. A blood pressure reading includes a higher number over a lower number. Ideally, your blood pressure should be below 120/80. You should know that: The first, or top, number is called the systolic pressure. It is a measure of the pressure in your arteries as your heart beats. The second, or bottom number, is called the diastolic pressure. It is a measure of the pressure in your arteries as the heart relaxes. Blood pressure is classified into four stages. Based on your blood pressure reading, your health care provider may use the following stages to determine what type of treatment you need, if any. Systolic pressure and diastolic pressure are measured in a unit called mmHg. Normal Systolic pressure: below 120. Diastolic pressure: below 80. Elevated Systolic pressure: 120-129. Diastolic pressure: below 80. Hypertension stage 1 Systolic pressure: 130-139. Diastolic pressure: 80-89. Hypertension stage 2 Systolic pressure: 140 or above. Diastolic pressure: 90 or above. How can this condition affect me? Managing your hypertension is an important responsibility. Over time, hypertension can damage the arteries and decrease blood flow to important parts of the body, including the brain, heart, and kidneys. Having untreated or uncontrolled hypertension can lead to: A heart attack. A stroke. A weakened blood vessel (aneurysm). Heart failure. Kidney damage. Eye damage. Metabolic syndrome. Memory and  concentration problems. Vascular dementia. What actions can I take to manage this condition? Hypertension can be managed by making lifestyle changes and possibly by taking medicines. Your health care provider will help you make a plan to bring your blood pressure within a normal range. Nutrition  Eat a diet that is high in fiber and potassium, and low in salt (sodium), added sugar, and fat. An example eating plan is called the Dietary Approaches to Stop Hypertension (DASH) diet. To eat this way: Eat plenty of fresh fruits and vegetables. Try to fill one-half of your plate at each meal with fruits and vegetables. Eat whole grains, such as whole-wheat pasta, brown rice, or whole-grain bread. Fill about one-fourth of your plate with whole grains. Eat low-fat dairy products. Avoid fatty cuts of meat, processed or cured meats, and poultry with skin. Fill about one-fourth of your plate with lean proteins such as fish, chicken without skin, beans, eggs, and tofu. Avoid pre-made and processed foods. These tend to be higher in sodium, added sugar, and fat. Reduce your daily sodium intake. Most people with hypertension should eat less than 1,500 mg of sodium a day. Lifestyle  Work with your health care provider to maintain a healthy body weight or to lose weight. Ask what an ideal weight is for you. Get at least 30 minutes of exercise that causes your heart to beat faster (aerobic exercise) most days of the week. Activities may include walking, swimming, or biking. Include exercise to strengthen your muscles (resistance exercise), such as weight lifting, as part of your weekly exercise routine. Try to do these types of exercises for 30 minutes at least 3 days a week. Do not use any products that contain nicotine or tobacco, such as cigarettes, e-cigarettes,   and chewing tobacco. If you need help quitting, ask your health care provider. Control any long-term (chronic) conditions you have, such as high  cholesterol or diabetes. Identify your sources of stress and find ways to manage stress. This may include meditation, deep breathing, or making time for fun activities. Alcohol use Do not drink alcohol if: Your health care provider tells you not to drink. You are pregnant, may be pregnant, or are planning to become pregnant. If you drink alcohol: Limit how much you use to: 0-1 drink a day for women. 0-2 drinks a day for men. Be aware of how much alcohol is in your drink. In the [place] one drink equals one 12 oz bottle of beer (355 mL), one 5 oz glass of wine (148 mL), or one 1 oz glass of hard liquor (44 mL). Medicines Your health care provider may prescribe medicine if lifestyle changes are not enough to get your blood pressure under control and if: Your systolic blood pressure is 130 or higher. Your diastolic blood pressure is 80 or higher. Take medicines only as told by your health care provider. Follow the directions carefully. Blood pressure medicines must be taken as told by your health care provider. The medicine does not work as well when you skip doses. Skipping doses also puts you at risk for problems. Monitoring Before you monitor your blood pressure: Do not smoke, drink caffeinated beverages, or exercise within 30 minutes before taking a measurement. Use the bathroom and empty your bladder (urinate). Sit quietly for at least 5 minutes before taking measurements. Monitor your blood pressure at home as told by your health care provider. To do this: Sit with your back straight and supported. Place your feet flat on the floor. Do not cross your legs. Support your arm on a flat surface, such as a table. Make sure your upper arm is at heart level. Each time you measure, take two or three readings one minute apart and record the results. You may also need to have your blood pressure checked regularly by your health care provider. General information Talk with your health care  provider about your diet, exercise habits, and other lifestyle factors that may be contributing to hypertension. Review all the medicines you take with your health care provider because there may be side effects or interactions. Keep all visits as told by your health care provider. Your health care provider can help you create and adjust your plan for managing your high blood pressure. Where to find more information National Heart, Lung, and Blood Institute: www.nhlbi.nih.gov American Heart Association: www.heart.org Contact a health care provider if: You think you are having a reaction to medicines you have taken. You have repeated (recurrent) headaches. You feel dizzy. You have swelling in your ankles. You have trouble with your vision. Get help right away if: You develop a severe headache or confusion. You have unusual weakness or numbness, or you feel faint. You have severe pain in your chest or abdomen. You vomit repeatedly. You have trouble breathing. These symptoms may represent a serious problem that is an emergency. Do not wait to see if the symptoms will go away. Get medical help right away. Call your local emergency services (911 in the [place] Do not drive yourself to the hospital. Summary Hypertension is when the force of blood pumping through your arteries is too strong. If this condition is not controlled, it may put you at risk for serious complications. Your personal target blood pressure may vary depending on   your medical conditions, your age, and other factors. For most people, a normal blood pressure is less than 120/80. Hypertension is managed by lifestyle changes, medicines, or both. Lifestyle changes to help manage hypertension include losing weight, eating a healthy, low-sodium diet, exercising more, stopping smoking, and limiting alcohol. This information is not intended to replace advice given to you by your health care provider. Make sure you discuss any questions  you have with your health care provider. Document Revised: 04/06/2019 Document Reviewed: 02/17/2019 Elsevier Patient Education  2022 Elsevier Inc.  

## 2021-06-17 LAB — COMP. METABOLIC PANEL (12)
AST: 19 IU/L (ref 0–40)
Albumin/Globulin Ratio: 1.3 (ref 1.2–2.2)
Albumin: 4.4 g/dL (ref 3.8–4.8)
Alkaline Phosphatase: 127 IU/L — ABNORMAL HIGH (ref 44–121)
BUN/Creatinine Ratio: 15 (ref 12–28)
BUN: 13 mg/dL (ref 8–27)
Bilirubin Total: 0.3 mg/dL (ref 0.0–1.2)
Calcium: 10.3 mg/dL (ref 8.7–10.3)
Chloride: 109 mmol/L — ABNORMAL HIGH (ref 96–106)
Creatinine, Ser: 0.89 mg/dL (ref 0.57–1.00)
Globulin, Total: 3.4 g/dL (ref 1.5–4.5)
Glucose: 109 mg/dL — ABNORMAL HIGH (ref 70–99)
Potassium: 3.8 mmol/L (ref 3.5–5.2)
Sodium: 147 mmol/L — ABNORMAL HIGH (ref 134–144)
Total Protein: 7.8 g/dL (ref 6.0–8.5)
eGFR: 71 mL/min/{1.73_m2} (ref >59)

## 2021-06-17 LAB — LIPID PANEL
Chol/HDL Ratio: 2.8 ratio (ref 0.0–4.4)
Cholesterol, Total: 176 mg/dL (ref 100–199)
HDL: 63 mg/dL (ref >39)
LDL Chol Calc (NIH): 102 mg/dL — ABNORMAL HIGH (ref 0–99)
Triglycerides: 59 mg/dL (ref 0–149)
VLDL Cholesterol Cal: 11 mg/dL (ref 5–40)

## 2021-06-17 LAB — HEPATITIS C ANTIBODY: Hep C Virus Ab: NONREACTIVE

## 2021-06-19 ENCOUNTER — Other Ambulatory Visit: Payer: Self-pay | Admitting: Nurse Practitioner

## 2021-06-19 MED ORDER — BLOOD GLUCOSE MONITOR KIT: PACK | 0 refills | Status: AC

## 2021-06-19 NOTE — Progress Notes (Deleted)
   [facility] [address] [place], [place]  [address] Phone:  [phone]   Fax:  [phone] 

## 2021-06-30 ENCOUNTER — Ambulatory Visit: Payer: Medicaid Other | Attending: Nurse Practitioner | Admitting: Physical Therapy

## 2021-07-04 ENCOUNTER — Telehealth: Payer: Self-pay | Admitting: Nurse Practitioner

## 2021-07-04 NOTE — Telephone Encounter (Signed)
Refill request  Metoprolol 100mg  #180 ?[facility] [address] ?

## 2021-07-05 ENCOUNTER — Other Ambulatory Visit: Payer: Self-pay | Admitting: Nurse Practitioner

## 2021-07-05 DIAGNOSIS — I10 Essential (primary) hypertension: Secondary | ICD-10-CM

## 2021-07-05 MED ORDER — METOPROLOL TARTRATE 100 MG PO TABS: 100.0000 mg | ORAL_TABLET | Freq: Two times a day (BID) | ORAL | 3 refills | Status: DC

## 2021-07-12 ENCOUNTER — Ambulatory Visit: Payer: Medicare (Managed Care) | Admitting: Physical Therapy

## 2021-07-24 ENCOUNTER — Telehealth: Payer: Self-pay

## 2021-07-24 ENCOUNTER — Ambulatory Visit: Payer: Medicare (Managed Care) | Attending: Nurse Practitioner

## 2021-07-24 VITALS — BP 150/96 | HR 83

## 2021-07-24 DIAGNOSIS — R262 Difficulty in walking, not elsewhere classified: Secondary | ICD-10-CM | POA: Insufficient documentation

## 2021-07-24 DIAGNOSIS — Z8673 Personal history of transient ischemic attack (TIA), and cerebral infarction without residual deficits: Secondary | ICD-10-CM | POA: Insufficient documentation

## 2021-07-24 DIAGNOSIS — M6281 Muscle weakness (generalized): Secondary | ICD-10-CM | POA: Insufficient documentation

## 2021-07-24 DIAGNOSIS — R2689 Other abnormalities of gait and mobility: Secondary | ICD-10-CM | POA: Insufficient documentation

## 2021-07-24 DIAGNOSIS — R2681 Unsteadiness on feet: Secondary | ICD-10-CM | POA: Diagnosis not present

## 2021-07-24 NOTE — Telephone Encounter (Signed)
Thad Ranger, NP,  ? ?Carla Bauer was evaluated by Physical Therapy on 07/24/2021.  The patient would benefit from occupational therapy evaluation for RUE tone post CVA and ROM deficits.   ?If you agree, please place an order in [facility] workque in [facility] or fax the order to [phone]. ? ?Thank you, ?Adelfa Koh, PT, DPT ? ?[facility] ?[address] ?[address] ?[place], [place]  [address] ?Phone:  [phone] ?Fax:  [phone] ? ?

## 2021-07-24 NOTE — Telephone Encounter (Signed)
Carla Ranger, NP,  ? ?Carla Bauer is being treated by physical therapy for Chronic CVA and Residual RUE spasticity. They will benefit from use of Manual Wheelchair in order to improve safety with functional mobility.   ?Please submit order in epic or fax the order to [phone]. ? ? ?Thank you, ?Adelfa Koh, PT, DPT ?[facility] ?[address] ?[address] ?[place], [place]  [address] ?Phone:  [phone] ?Fax:  [phone] ? ? ?

## 2021-07-24 NOTE — Therapy (Signed)
?OUTPATIENT PHYSICAL THERAPY NEURO EVALUATION ? ? ?Patient Name: Carla Bauer ?MRN: [id number] ?DOB:1955/05/10, 66 y.o., female ?Today's Date: 07/24/2021 ? ?PCP: Barbette Merino, NP ?REFERRING PROVIDER: Barbette Merino, NP ? ? PT End of Session - 07/24/21 1415   ? ? Visit Number 1   ? Number of Visits 9   ? Date for PT Re-Evaluation --   at 9th visit  ? Authorization Type Medicaid (VL: 27)   ? PT Start Time 1410   patient arriving late  ? PT Stop Time 1444   ? PT Time Calculation (min) 34 min   ? Activity Tolerance Patient tolerated treatment well   ? Behavior During Therapy [facility] for tasks assessed/performed   ? ?  ?  ? ?  ? ? ?Past Medical History:  ?Diagnosis Date  ? Altered mental status   ? Hypertension   ? Insomnia   ? Right sided weakness   ? Seizure-like activity (HCC)   ? Stroke (cerebrum) (HCC)   ? Vitamin D deficiency 08/2019  ? ?Past Surgical History:  ?Procedure Laterality Date  ? CESAREAN SECTION    ? CRANIOTOMY Left 08/14/2017  ? 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 IR GASTROSTOMY TUBE MOD SED  09/16/2017  ? IR GASTROSTOMY TUBE REMOVAL  12/06/2017  ? LAPAROSCOPIC REVISION VENTRICULAR-PERITONEAL (V-P) SHUNT N/A 08/29/2017  ? Procedure: LAPAROSCOPIC REVISION VENTRICULAR-PERITONEAL (V-P) SHUNT;  Surgeon: Abigail Miyamoto, MD;  Location: [facility] OR;  Service: General;  Laterality: N/A;  LAPAROSCOPIC REVISION VENTRICULAR-PERITONEAL (V-P) SHUNT  ? VENTRICULOPERITONEAL SHUNT Right 08/29/2017  ? Procedure: SHUNT INSERTION VENTRICULAR-PERITONEAL;  Surgeon: Lisbeth Renshaw, MD;  Location: [facility] OR;  Service: Neurosurgery;  Laterality: Right;  SHUNT INSERTION VENTRICULAR-PERITONEAL  ? ?Patient Active Problem List  ? Diagnosis Date Noted  ? 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 Decreased ambulation status 08/21/2019  ? Benign essential HTN   ? Hypernatremia   ?  Dysphagia due to recent stroke   ? Spastic hemiparesis (HCC)   ? S/P percutaneous endoscopic gastrostomy (PEG) tube placement (HCC)   ? Labile blood glucose   ? Sinus tachycardia   ? Intraparenchymal hematoma of brain (HCC) 09/03/2017  ? Right hemiparesis (HCC)   ? Oropharyngeal dysphagia   ? Essential hypertension   ? Acute lower UTI   ? Tachypnea   ? Tachycardia   ? Reactive hypertension   ? Prediabetes   ? Acute blood loss anemia   ? Hypokalemia   ? Fever   ? Leukocytosis   ? Acute respiratory failure with hypoxia (HCC)   ? Cerebrovascular accident (CVA) due to thrombosis of left posterior cerebral artery (HCC)   ? ICH (intracerebral hemorrhage) (HCC) 08/14/2017  ? SAH (subarachnoid hemorrhage) (HCC) 08/14/2017  ? 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 Hydrocephalus (HCC) 08/14/2017  ? Brain herniation (HCC) 08/14/2017  ? Cytotoxic brain edema (HCC) 08/14/2017  ? ? ?ONSET DATE: 06/16/21 (Referral Date) ? 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 ?THERAPY DIAG:  ?Other abnormalities of gait and mobility ? ?Difficulty in walking, not elsewhere classified ? ?Muscle weakness (generalized) ? ?Unsteadiness on feet ? ?SUBJECTIVE:  ?                                                                                                                                                                                           ? ?  SUBJECTIVE STATEMENT: ?Patient reports had CVA in 2019. Reports she has not been getting around much since this in terms of walking. Patient is currently seated in rollator and reports she is using it as a wheelchair to propel her self, reports this is how she gets around for most of the day. Patient does have a RW at home, but is not walking with it much. No wheelchair at home. Reports no falls recently. Reports weakness on the R side. Reports she needs assistance with bathing, does not have tub bench or shower  seat, so is taking baths from sink (sounds like bird bath style).  ? ?Pt accompanied by: self and used transportation ? ?PERTINENT HISTORY: AMS, HTN, Insomnia, Seizure, CVA, Vitamin D Deficiency ? ?PAIN:  ?Are you having pain? No ? ?PRECAUTIONS: Fall ? ?WEIGHT BEARING RESTRICTIONS No ? ?FALLS: Has patient fallen in last 6 months? No ? ?LIVING ENVIRONMENT: ?Lives with: lives with their daughter ?Lives in: House/apartment; patient is on the bottom floor.  ?Stairs:  has stairs but does not need to use them; everything patient needs is on bottom floor.  ?Has following equipment at home: Dan Humphreys - 4 wheeled; reports she also has two wheeled walker but does not use consistently. Has no shower seat/tub bench.  ? ?PLOF: Needs assistance with ADLs, Needs assistance with homemaking, and Needs assistance with gait ? ?PATIENT GOALS improve the walking ? ?OBJECTIVE:  ? ? ?COGNITION: ?Overall cognitive status: No family/caregiver present to determine baseline cognitive functioning ?  ?SENSATION: ?WFL ? ?COORDINATION: ?Decreased coordination with RLE noted due to weakness ? ?EDEMA:  ?Mild swelling in R ankle noted ? ?TONE:  Moderate Tone noted in RLE; increased resistance to knee flexion. Increased tone in RUE, especially noted in hand. Patient reports has received no botox.  ? ? ?MMT:  (all completed from seated position) ? ?MMT Right ?07/24/2021 Left ?07/24/2021  ?Hip flexion 3+/5 4/5  ?Hip extension    ?Hip abduction 3/5 4+/5  ?Hip adduction 3/5 4+/5  ?Hip internal rotation    ?Hip external rotation    ?Knee flexion 3/5 4+/5  ?Knee extension 3+/5 4/5  ?Ankle dorsiflexion 1/5 4/5  ?Ankle plantarflexion    ?Ankle inversion    ?Ankle eversion    ?(Blank rows = not tested) ? ?BED MOBILITY:  ?Denies difficulty with bed mobility ? ?TRANSFERS: ?Assistive device utilized: Environmental consultant - 4 wheeled  ?Sit to stand: CGA and Min A ?Stand to sit: CGA and Min A ?Stand Pivot Transfer: Require Min A due to assistance with turning due to inability to  place RUE onto RW to turn AD.  ? ? ?GAIT: ?Gait pattern: step to pattern, decreased step length- Right, decreased step length- Left, decreased stance time- Right, decreased ankle dorsiflexion- Right, lateral lean- Left, and poor foot clearance- Right ?Distance walked: 45 ?Assistive device utilized: Environmental consultant - 4 wheeled ?Level of assistance: Min A ?Comments: completed ambulation with rollator with single UE, unable to place RUE onto rollator due to tone.PT assisting on R side with steering of rollator and Min A for balance able to ambulate 45 ft with slow gait speed noted. PT trialed with standard RW with R Hand orthotic but unable to get RUE into splint due to significant tone. ? ?PT providing extensive education on safety concerns with using rollator like w/c. Patient verbalized understanding. PT to request order for wheelchair to promote safety within the home and community.  ? ?FUNCTIONAL TESTs:  ?5 times sit to stand: 34.53 seconds with single UE support ? ?  PATIENT SURVEYS:  ?FOTO staff did not capture on eval ? ? ?PATIENT EDUCATION: ?Education details: Educated on [employer]POC/Eval Findings; Safety Concerns with use of Rollator (see above); OT referral  ?Person educated: Patient ?Education method: Explanation ?Education comprehension: verbalized understanding ? ? ?HOME EXERCISE PROGRAM: ?To be Established at Next Session ? ? ? ?GOALS: ?Goals reviewed with patient? Yes ? ?SHORT TERM GOALS: Target date: at 5th Visit ? ?Pt will be independent with initial HEP for improved strengthening with caregiver/family member supervision  ?Baseline: no HEP established ?Goal status: INITIAL ? ?2.  Pt will improve 5x STS to </= 30 sec to demo improved functional LE strength and balance  ?Baseline: 34.53 seconds with single UE support ?Goal status: INITIAL ? ?3.  Pt will be able to ambulate >/= 100 ft with RW and CGA for improved household mobility ?Baseline: 45 ft Min A ?Goal status: INITIAL ? ?4.  Gait Speed TBA and STG/LTG to be set as  applicable ?Baseline: TBA ?Goal status: INITIAL ? ? ?LONG TERM GOALS: Target date: at 9th Visit ? ?Pt will be independent with final HEP for improved strengthening with caregiver/family member supervision  ?Ba

## 2021-08-02 ENCOUNTER — Ambulatory Visit: Payer: Medicare (Managed Care) | Attending: Nurse Practitioner | Admitting: Physical Therapy

## 2021-08-02 DIAGNOSIS — R2681 Unsteadiness on feet: Secondary | ICD-10-CM | POA: Insufficient documentation

## 2021-08-02 DIAGNOSIS — R2689 Other abnormalities of gait and mobility: Secondary | ICD-10-CM | POA: Diagnosis not present

## 2021-08-02 DIAGNOSIS — M6281 Muscle weakness (generalized): Secondary | ICD-10-CM | POA: Diagnosis not present

## 2021-08-02 DIAGNOSIS — R262 Difficulty in walking, not elsewhere classified: Secondary | ICD-10-CM | POA: Insufficient documentation

## 2021-08-02 NOTE — Therapy (Signed)
?OUTPATIENT PHYSICAL THERAPY TREATMENT NOTE ? ? ?Patient Name: Carla Bauer ?MRN: [id number] ?DOB:12/25/1955, 66 y.o., female ?Today's Date: 08/02/2021 ? ?PCP: Barbette MerinoKing, Crystal M, NP  ?REFERRING PROVIDER: Barbette MerinoKing, Crystal M, NP  ? ?END OF SESSION:  ? PT End of Session - 08/02/21 1239   ? ? Visit Number 2   ? Number of Visits 9   ? Date for PT Re-Evaluation --   at 9th visit  ? Authorization Type Medicaid (VL: 27)   ? PT Start Time 1235   Pt arrived late  ? PT Stop Time 1314   ? PT Time Calculation (min) 39 min   ? Equipment Utilized During Treatment Gait belt   ? Activity Tolerance Patient tolerated treatment well   ? [facility] tasks assessed/performed   ? ?  ?  ? ?  ? ? ?Past Medical History:  ?Diagnosis Date  ? Altered mental status   ? Hypertension   ? Insomnia   ? Right sided weakness   ? Seizure-like activity (HCC)   ? Stroke (cerebrum) (HCC)   ? Vitamin D deficiency 08/2019  ? ?Past Surgical History:  ?Procedure Laterality Date  ? CESAREAN SECTION    ? CRANIOTOMY Left 08/14/2017  ? 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 IR GASTROSTOMY TUBE MOD SED  09/16/2017  ? IR GASTROSTOMY TUBE REMOVAL  12/06/2017  ? LAPAROSCOPIC REVISION VENTRICULAR-PERITONEAL (V-P) SHUNT N/A 08/29/2017  ? Procedure: LAPAROSCOPIC REVISION VENTRICULAR-PERITONEAL (V-P) SHUNT;  Surgeon: Abigail MiyamotoBlackman, Douglas, MD;  Location: [facility] OR;  Service: General;  Laterality: N/A;  LAPAROSCOPIC REVISION VENTRICULAR-PERITONEAL (V-P) SHUNT  ? VENTRICULOPERITONEAL SHUNT Right 08/29/2017  ? Procedure: SHUNT INSERTION VENTRICULAR-PERITONEAL;  Surgeon: Lisbeth RenshawNundkumar, Neelesh, MD;  Location: [facility] OR;  Service: Neurosurgery;  Laterality: Right;  SHUNT INSERTION VENTRICULAR-PERITONEAL  ? ?Patient Active Problem List  ? Diagnosis Date Noted  ? 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 Decreased ambulation status  08/21/2019  ? Benign essential HTN   ? Hypernatremia   ? Dysphagia due to recent stroke   ? Spastic hemiparesis (HCC)   ? S/P percutaneous endoscopic gastrostomy (PEG) tube placement (HCC)   ? Labile blood glucose   ? Sinus tachycardia   ? Intraparenchymal hematoma of brain (HCC) 09/03/2017  ? Right hemiparesis (HCC)   ? Oropharyngeal dysphagia   ? Essential hypertension   ? Acute lower UTI   ? Tachypnea   ? Tachycardia   ? Reactive hypertension   ? Prediabetes   ? Acute blood loss anemia   ? Hypokalemia   ? Fever   ? Leukocytosis   ? Acute respiratory failure with hypoxia (HCC)   ? Cerebrovascular accident (CVA) due to thrombosis of left posterior cerebral artery (HCC)   ? ICH (intracerebral hemorrhage) (HCC) 08/14/2017  ? SAH (subarachnoid hemorrhage) (HCC) 08/14/2017  ? 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 Hydrocephalus (HCC) 08/14/2017  ? Brain herniation (HCC) 08/14/2017  ? Cytotoxic brain edema (HCC) 08/14/2017  ? ? 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 ?THERAPY DIAG:  ?Unsteadiness on feet ? ?Muscle weakness (generalized) ? ?Difficulty in walking, not elsewhere classified ? ?Other abnormalities of gait and mobility ? ?PERTINENT HISTORY: Altered mental status, Hypertension, Insomnia, Right sided weakness, Seizure-like activity (HCC), Stroke (cerebrum) (HCC), and Vitamin D deficiency (08/2019).   ? ?PRECAUTIONS: Fall ? ?  SUBJECTIVE: Pt reports she has been trying to walk at home w/granddaughter and sleeps on the couch, not a bedroom like she previously reported on eval. No other changes to report.  ? ?PAIN:  ?Are you having pain? No ? ?VITALS: All BP readings taken in LUE while seated  ?-Pre-session: 140/81 mmHg, HR 92 bpm  ? ?OBJECTIVE: (objective measures completed at initial evaluation unless otherwise dated) ? ?COGNITION: ?Overall cognitive status: No family/caregiver present to determine baseline cognitive  functioning ? ?  TODAY'S TREATMENT ?  Gait Training  ?Gait pattern: step to pattern, decreased arm swing- Right, decreased step length- Right, decreased step length- Left, decreased stride length, decreased hip/knee flexion- Right, decreased ankle dorsiflexion- Right, circumduction- Right, Right steppage, Right foot flat, lateral lean- Left, and poor foot clearance- Right ?Distance walked: Various clinic distances  ?Assistive device utilized: Hemi walker ?Level of assistance: CGA and Min A ?Comments: Noted significant foot drop of R foot throughout. Attempted to trial RW w/R hand orthotic but unable to place R hand into orthotic at this time. Pt sequenced HW well, min cues to avoid placing walker too close to feet to prevent tripping over HW. Noted L gait deviations and minor L inattention.  ? ? [facility] PT Assessment - 08/02/21 1304   ? ?  ? Ambulation/Gait  ? Gait velocity 32.8' over 136.28s = 0.24 ft/s w/hemiwalker in LUE and min guard   ? ?  ?  ? ?  ? ?  Ther Ex ?Practiced proper sit <>stand technique w/LUE support. Pt demonstrates significant rotation to L side w/stand and is unable to let go of chair at top of transfer. Added proper sit <>stand technique to HEP and provided max education for safe set up at home and having granddaughter assist. Pt verbalized understanding.  ?  ?PATIENT EDUCATION: ?Education details: Updates on WC and OT orders, safe performance of HEP at home  ?Person educated: Patient ?Education method: Explanation and handout ?Education comprehension: verbalized understanding and needs further education  ?  ?  ?HOME EXERCISE PROGRAM: ?Access Code: [email or web address] ?URL: [email or web address] ?Date: 08/02/2021 ?Prepared by: Alethia Berthold Yariela Tison ? ?Exercises ?- Proper Sit to Stand Technique  - 1 x daily - 7 x weekly - 3 sets - 3-5 reps ?  ?  ?  ?GOALS: ?Goals reviewed with patient? Yes ?  ?SHORT TERM GOALS: Target date: at 5th Visit ?  ?Pt will be independent with initial HEP for improved  strengthening with caregiver/family member supervision  ?Baseline: no HEP established ?Goal status: INITIAL ?  ?2.  Pt will improve 5x STS to </= 30 sec to demo improved functional LE strength and balance  ?Baseline: 34.53 seconds with single UE support ?Goal status: INITIAL ?  ?3.  Pt will be able to ambulate >/= 100 ft with RW and CGA for improved household mobility ?Baseline: 45 ft Min A ?Goal status: INITIAL ?  ?4.  Gait Speed TBA and STG/LTG to be set as applicable ?Baseline: TBA ?Goal status: INITIAL ?  ?  ?LONG TERM GOALS: Target date: at 9th Visit ?  ?Pt will be independent with final HEP for improved strengthening with caregiver/family member supervision  ?Baseline: no HEP established ?Goal status: INITIAL ?  ?2.  Pt will improve 5x STS to </= 25 sec to demo improved functional LE strength and balance  ?Baseline: 34.53 seconds with single UE support ?Goal status: INITIAL ?  ?3.  LTG to be set for Gait Speed as applicable ?Baseline: TBA ?Goal status: INITIAL ?  ?4.  Pt will be able to ambulate >/= 200 ft indoors with RW and CGA to demo improved household ambulation  ?Baseline: 54ft Min A ?Goal status: INITIAL ?  ?5.  Pt will be able to complete stand pivot transfer with RW with supervision to demo improved safety with functional mobility ?Baseline:  ?Goal status: INITIAL ?  ?ASSESSMENT: ?  ?CLINICAL IMPRESSION: ?Emphasis of skilled PT session on assessing gait with hemiwalker, gait speed and establishing initial HEP. Unable to trial RW at this time due to tone of RUE. Pt able to use hemiwalker well but requires min guard and cues for proper placement of AD as pt tends to trip over walker or drift to L side and run into obstacles. Due to time constraints, was only able to add sit <>stands to HEP. Will add to HEP next session. Continue POC.  ?  ?  ?  ?OBJECTIVE IMPAIRMENTS Abnormal gait, decreased activity tolerance, decreased balance, decreased coordination, decreased endurance, decreased knowledge of use of  DME, decreased mobility, difficulty walking, decreased strength, decreased safety awareness, hypomobility, impaired flexibility, impaired tone, and postural dysfunction.  ?  ?ACTIVITY LIMITATIONS cleaning, meal

## 2021-08-07 ENCOUNTER — Ambulatory Visit: Payer: Medicare (Managed Care) | Admitting: Physical Therapy

## 2021-08-16 ENCOUNTER — Encounter: Payer: Self-pay | Admitting: Physical Therapy

## 2021-08-16 ENCOUNTER — Ambulatory Visit: Payer: Medicare (Managed Care) | Admitting: Physical Therapy

## 2021-08-16 VITALS — BP 144/92

## 2021-08-16 DIAGNOSIS — R2681 Unsteadiness on feet: Secondary | ICD-10-CM

## 2021-08-16 DIAGNOSIS — R2689 Other abnormalities of gait and mobility: Secondary | ICD-10-CM

## 2021-08-16 DIAGNOSIS — R262 Difficulty in walking, not elsewhere classified: Secondary | ICD-10-CM

## 2021-08-16 DIAGNOSIS — M6281 Muscle weakness (generalized): Secondary | ICD-10-CM

## 2021-08-16 NOTE — Therapy (Signed)
OUTPATIENT PHYSICAL THERAPY TREATMENT NOTE   Patient Name: Carla Bauer MRN: [id number] DOB:08-Dec-1955, [age] y.o., female Today's Date: 08/17/2021  PCP: Barbette MerinoKing, Crystal M, NP  REFERRING PROVIDER: Barbette MerinoKing, Crystal M, NP   END OF SESSION:   PT End of Session - 08/16/21 1240     Visit Number 3    Number of Visits 9    Date for PT Re-Evaluation --   at 9th visit   Authorization Type Medicaid (VL: 27)    PT Start Time 1235    PT Stop Time 1314    PT Time Calculation (min) 39 min    Equipment Utilized During Treatment Gait belt    Activity Tolerance Patient tolerated treatment well    Behavior During Therapy WFL for tasks assessed/performed             Past Medical History:  Diagnosis Date   Altered mental status    Hypertension    Insomnia    Right sided weakness    Seizure-like activity (HCC)    Stroke (cerebrum) (HCC)    Vitamin D deficiency 08/2019   Past Surgical History:  Procedure Laterality Date   CESAREAN SECTION     CRANIOTOMY Left 08/14/2017   Procedure: 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;  Surgeon: Lisbeth RenshawNundkumar, Neelesh, MD;  Location: [facility];  Service: Neurosurgery;  Laterality: Left;   IR GASTROSTOMY TUBE MOD SED  09/16/2017   IR GASTROSTOMY TUBE REMOVAL  12/06/2017   LAPAROSCOPIC REVISION VENTRICULAR-PERITONEAL (V-P) SHUNT N/A 08/29/2017   Procedure: LAPAROSCOPIC REVISION VENTRICULAR-PERITONEAL (V-P) SHUNT;  Surgeon: Abigail MiyamotoBlackman, Douglas, MD;  Location: [facility];  Service: General;  Laterality: N/A;  LAPAROSCOPIC REVISION VENTRICULAR-PERITONEAL (V-P) SHUNT   VENTRICULOPERITONEAL SHUNT Right 08/29/2017   Procedure: SHUNT INSERTION VENTRICULAR-PERITONEAL;  Surgeon: Lisbeth RenshawNundkumar, Neelesh, MD;  Location: [facility];  Service: Neurosurgery;  Laterality: Right;  SHUNT INSERTION VENTRICULAR-PERITONEAL   Patient Active Problem List   Diagnosis Date Noted   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 08/21/2019   Decreased ambulation status 08/21/2019    Benign essential HTN    Hypernatremia    Dysphagia due to recent stroke    Spastic hemiparesis (HCC)    S/P percutaneous endoscopic gastrostomy (PEG) tube placement (HCC)    Labile blood glucose    Sinus tachycardia    Intraparenchymal hematoma of brain (HCC) 09/03/2017   Right hemiparesis (HCC)    Oropharyngeal dysphagia    Essential hypertension    Acute lower UTI    Tachypnea    Tachycardia    Reactive hypertension    Prediabetes    Acute blood loss anemia    Hypokalemia    Fever    Leukocytosis    Acute respiratory failure with hypoxia (HCC)    Cerebrovascular accident (CVA) due to thrombosis of left posterior cerebral artery (HCC)    ICH (intracerebral hemorrhage) (HCC) 08/14/2017   SAH (subarachnoid hemorrhage) (HCC) 08/14/2017   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    PRECAUTIONS: Fall  SUBJECTIVE: No new complaints.  No falls or pain to report. The one HEP is going well.  PAIN:  Are you having pain? No  Vitals:   08/16/21 1242 08/16/21 1246  BP: (!) 120/102 (!) 144/92     OBJECTIVE:   COGNITION: Overall cognitive status: No family/caregiver present to determine baseline cognitive functioning  TODAY'S TREATMENT  08/16/2021  TRANSFERS:   Sit<>stand: cues needed to scoot to edge of surface, for right foot placement, to push from the surface with left UE and for weight shifting  to stand up. Then cues to reach back and use left UE for controlled descent with sitting down. Performed several reps at edge of mat working on correct technique with cues needed, decreased carryover noted.   Stand pivot: with hemi walker: min assist with cues on sequencing and technique with transfer wheelchair<>mat table.    STRENGTHENING  Reviewed ex on HEP from last session. Added new ex's this session. Refer to medbridge for full details.  Cues needed on correct form and technique.     PATIENT EDUCATION: Education details: additions to HEP Person educated: Patient Education method: Explanation and handout Education comprehension: verbalized understanding and needs further education      HOME EXERCISE PROGRAM: Access Code: [email or web address] URL: [email or web address] Date: 08/16/2021 Prepared by: Sallyanne Kuster  Exercises - Proper Sit to Stand Technique  - 1 x daily - 7 x weekly - 3 sets - 3-5 reps  Added to HEP today - Seated Heel Raise  - 1 x daily - 5 x weekly - 1 sets - 10 reps - Seated Long Arc Quad  - 1 x daily - 5 x weekly - 1 sets - 10 reps - Seated Knee Flexion Slide  - 1 x daily - 5 x weekly - 1 sets - 10 reps     GOALS: Goals reviewed with patient? Yes   SHORT TERM GOALS: Target date: at 5th Visit   Pt will be independent with initial HEP for improved strengthening with caregiver/family member supervision  Baseline: no HEP established Goal status: INITIAL   2.  Pt will improve 5x STS to </= 30 sec to demo improved functional LE strength and balance  Baseline: 34.53 seconds with single UE support Goal status: INITIAL   3.  Pt will be able to ambulate >/= 100 ft with RW and CGA for improved household mobility Baseline: 45 ft Min A Goal status: INITIAL   4.  Gait Speed TBA and STG/LTG to be set as applicable Baseline: TBA Goal status: INITIAL     LONG TERM GOALS: Target date: at 9th Visit   Pt will be independent with final HEP for improved  strengthening with caregiver/family member supervision  Baseline: no HEP established Goal status: INITIAL   2.  Pt will improve 5x STS to </= 25 sec to demo improved functional LE strength and balance  Baseline: 34.53 seconds with single UE support Goal status: INITIAL   3.  LTG to be set for Gait Speed as applicable Baseline: TBA Goal status: INITIAL   4.  Pt will be able to ambulate >/= 200 ft indoors with RW and CGA to demo improved household ambulation  Baseline: 77ft Min A Goal status: INITIAL   5.  Pt will be able to complete stand pivot transfer with RW with supervision to demo improved safety with functional mobility Baseline:  Goal status: INITIAL   ASSESSMENT:   CLINICAL IMPRESSION: Pt's BP elevated at start of session, decreased with seated rest break to WFL's. Skilled session  continued to focus on transfer training, use of hemi walker and addition of ex's to pt's HEP for strengthening. No issues noted or reported in session. Pt's BP remained WFL's for rest of session. The pt is making progress toward goals and should benefit from continued PT to progress toward unmet goals.        OBJECTIVE IMPAIRMENTS Abnormal gait, decreased activity tolerance, decreased balance, decreased coordination, decreased endurance, decreased knowledge of use of DME, decreased mobility, difficulty walking, decreased strength, decreased safety awareness, hypomobility, impaired flexibility, impaired tone, and postural dysfunction.    ACTIVITY LIMITATIONS cleaning, meal prep, and medication management.    PERSONAL FACTORS Age, Time since onset of injury/illness/exacerbation, and 3+ comorbidities: AMS, HTN, Insomnia, Seizure, CVA, Vitamin D Deficiency  are also affecting patient's functional outcome.      REHAB POTENTIAL: Good   CLINICAL DECISION MAKING: Stable/uncomplicated   EVALUATION COMPLEXITY: Low   PLAN: PT FREQUENCY: 1x/week   PT DURATION: 8 weeks   PLANNED INTERVENTIONS:  Therapeutic exercises, Therapeutic activity, Neuromuscular re-education, Balance training, Gait training, Patient/Family education, Joint manipulation, Joint mobilization, Stair training, Orthotic/Fit training, DME instructions, Wheelchair mobility training, Cryotherapy, Moist heat, and Manual therapy   PLAN FOR NEXT SESSION: Monitor BP. ? Add supine ex's to HEP. gait training with hemiwalker, trial AFOs for foot drop of RLE, work on standing balance, right LE stretching due to tone    Sallyanne Kuster, PTA, [facility] [address], [address] [place], [place] [address] [phone] 08/17/21, 7:37 PM

## 2021-08-23 ENCOUNTER — Ambulatory Visit: Payer: Medicare (Managed Care)

## 2021-08-30 ENCOUNTER — Ambulatory Visit: Payer: Medicare (Managed Care)

## 2021-08-30 VITALS — BP 122/83 | HR 87

## 2021-08-30 DIAGNOSIS — R262 Difficulty in walking, not elsewhere classified: Secondary | ICD-10-CM

## 2021-08-30 DIAGNOSIS — R2681 Unsteadiness on feet: Secondary | ICD-10-CM

## 2021-08-30 DIAGNOSIS — M6281 Muscle weakness (generalized): Secondary | ICD-10-CM

## 2021-08-30 DIAGNOSIS — R2689 Other abnormalities of gait and mobility: Secondary | ICD-10-CM

## 2021-08-30 NOTE — Therapy (Signed)
OUTPATIENT PHYSICAL THERAPY TREATMENT NOTE   Patient Name: FRIEDA ARNALL MRN: [id number] DOB:11-29-1955, 66 y.o., female Today's Date: 08/30/2021  PCP: Vevelyn Francois, NP  REFERRING PROVIDER: Vevelyn Francois, NP   END OF SESSION:   PT End of Session - 08/30/21 1222     Visit Number 4    Number of Visits 9    Date for PT Re-Evaluation --   at 9th visit   Authorization Type Medicaid (VL: 38)    Authorization Time Period awaiting additional authorization    PT Start Time 1230    PT Stop Time 1313    PT Time Calculation (min) 43 min    Equipment Utilized During Treatment Gait belt    Activity Tolerance Patient tolerated treatment well    Behavior During Therapy WFL for tasks assessed/performed             Past Medical History:  Diagnosis Date   Altered mental status    Hypertension    Insomnia    Right sided weakness    Seizure-like activity ([place])    Stroke (cerebrum) ([place])    Vitamin D deficiency 08/2019   Past Surgical History:  Procedure Laterality Date   CESAREAN SECTION     CRANIOTOMY Left 08/14/2017   Procedure: 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;  Surgeon: Consuella Lose, MD;  Location: [facility];  Service: Neurosurgery;  Laterality: Left;   IR GASTROSTOMY TUBE MOD SED  09/16/2017   IR GASTROSTOMY TUBE REMOVAL  12/06/2017   LAPAROSCOPIC REVISION VENTRICULAR-PERITONEAL (V-P) SHUNT N/A 08/29/2017   Procedure: LAPAROSCOPIC REVISION VENTRICULAR-PERITONEAL (V-P) SHUNT;  Surgeon: Coralie Keens, MD;  Location: [facility];  Service: General;  Laterality: N/A;  LAPAROSCOPIC REVISION VENTRICULAR-PERITONEAL (V-P) SHUNT   VENTRICULOPERITONEAL SHUNT Right 08/29/2017   Procedure: SHUNT INSERTION VENTRICULAR-PERITONEAL;  Surgeon: Consuella Lose, MD;  Location: [facility];  Service: Neurosurgery;  Laterality: Right;  SHUNT INSERTION VENTRICULAR-PERITONEAL   Patient Active Problem List   Diagnosis Date Noted   S/P stroke due to cerebrovascular  disease 08/21/2019   Decreased ambulation status 08/21/2019   Benign essential HTN    Hypernatremia    Dysphagia due to recent stroke    Spastic hemiparesis (HCC)    S/P percutaneous endoscopic gastrostomy (PEG) tube placement (HCC)    Labile blood glucose    Sinus tachycardia    Intraparenchymal hematoma of brain ([place]) 09/03/2017   Right hemiparesis (HCC)    Oropharyngeal dysphagia    Essential hypertension    Acute lower UTI    Tachypnea    Tachycardia    Reactive hypertension    Prediabetes    Acute blood loss anemia    Hypokalemia    Fever    Leukocytosis    Acute respiratory failure with hypoxia (HCC)    Cerebrovascular accident (CVA) due to thrombosis of left posterior cerebral artery (HCC)    ICH (intracerebral hemorrhage) ([place]) 08/14/2017   SAH (subarachnoid hemorrhage) ([place]) 08/14/2017   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  PRECAUTIONS: Fall  SUBJECTIVE: Patient reports no new changes or complaints. No falls to report.   PAIN:  Are you having pain? No  Vitals:   08/30/21 1236  BP: 122/83  Pulse: 87    OBJECTIVE:  TODAY'S TREATMENT 08/30/2021  TRANSFERS:   Assistive device utilized: Hemi-Walker Sit to stand: CGA - cues needed to scoot to edge of surface, for right foot placement, to push from the surface with left UE and for weight shifting to stand up. Stand to sit:  CGA Stand Pivot Transfer: Completed with Hemi- Walker and CGA - Min A, cues on sequencing and technique with transfer wheelchair<>mat table     GAIT: Gait pattern: step to pattern, decreased step length- Right, decreased step length- Left, decreased stance time- Right, decreased ankle dorsiflexion- Right, lateral lean- Left, and poor foot clearance- Right Distance walked: 40 ft x 2 reps (with seated rest break required between ambulation Assistive device utilized: Hemi-Walker Level of assistance: Min A Comments: completed ambulation with hemi-walker, with PT assisting on R side at knee blocking recurvatum noted. Cues for step length and facilitation for weight shift and promoting improved stance on RLE.   FUNCTIONAL TESTs:  5 times sit to stand: 31.21 seconds with single UE support with CGA from PT.   NMR Reviewed [place] [facility]. No questions/concerns from patient or grandchild today.   Standing with single UE support on Hemi-Walker: completed steps forward/posterior to target with LLE to promote weight shift and stance time on RLE. Completed x 15 reps with PT facilitating for improved weight shift. PT blocking recurvatum, and cues to keep slight bend in R knee to promote reduced recurvatum. Seated rest break required after completion.   Sit to Stands: Completed sit <> stands from mat with single UE support, completed x 10 reps with PT faciliating for improved posture and increased weight shift onto RLE as demo heavy lean to L side initially. Pt tolerating well.   PATIENT EDUCATION: Education details: Progress toward STGs Person educated: Patient Education method: Explanation and handout Education comprehension: verbalized understanding and needs further education      HOME EXERCISE PROGRAM: Access Code: [email or web address] URL: [email or web address] Date: 08/16/2021 Prepared by: Willow Ora  Exercises - Proper Sit to Stand Technique  - 1 x daily - 7 x weekly - 3 sets - 3-5  reps - Seated Heel Raise  - 1 x daily - 5 x weekly - 1 sets - 10 reps - Seated Long Arc Quad  - 1 x daily - 5 x weekly - 1 sets - 10 reps - Seated Knee Flexion Slide  - 1 x daily - 5 x weekly - 1 sets - 10 reps     GOALS: Goals reviewed with patient? Yes   SHORT TERM GOALS: Target date: at 5th Visit   Pt will be independent with initial HEP for improved strengthening with caregiver/family member supervision  Baseline: no HEP established; reports independence with current HEP Goal status: MET   2.  Pt will improve 5x STS to </= 30 sec to demo improved functional LE strength and balance  Baseline: 34.53 seconds with single UE support; 31.21 seconds Goal status: ON-GOING   3.  Pt will be able to ambulate >/= 100 ft with RW and CGA for improved household mobility Baseline: 45 ft Min A; 40 ft with Min A Goal status:NOT MET   4.  Gait Speed TBA and STG/LTG to be set as applicable Baseline: TBA; unable to ambulate distance to obtain gait speed Goal  status: DEFERRED     LONG TERM GOALS: Target date: at 9th Visit   Pt will be independent with final HEP for improved strengthening with caregiver/family member supervision  Baseline: no HEP established Goal status: INITIAL   2.  Pt will improve 5x STS to </= 25 sec to demo improved functional LE strength and balance  Baseline: 34.53 seconds with single UE support Goal status: INITIAL   3.  LTG to be set for Gait Speed as applicable Baseline: TBA Goal status: INITIAL   4.  Pt will be able to ambulate >/= 200 ft indoors with RW and CGA to demo improved household ambulation  Baseline: 110f Min A Goal status: INITIAL   5.  Pt will be able to complete stand pivot transfer with RW with supervision to demo improved safety with functional mobility Baseline:  Goal status: INITIAL   ASSESSMENT:   CLINICAL IMPRESSION: Today's skilled PT session focused on progress toward STGs. Patient did improve 5x sit <> stand time however, not to goal  level. Patient was able to ambulate 40 ft x 2 today with hemi-walker, but required Min A due to recurvatum. Patient tolerating activities well, intermittent rest breaks due to fatigue. Will continue per POC and progress toward all LTGs.    OBJECTIVE IMPAIRMENTS Abnormal gait, decreased activity tolerance, decreased balance, decreased coordination, decreased endurance, decreased knowledge of use of DME, decreased mobility, difficulty walking, decreased strength, decreased safety awareness, hypomobility, impaired flexibility, impaired tone, and postural dysfunction.    ACTIVITY LIMITATIONS cleaning, meal prep, and medication management.    PERSONAL FACTORS Age, Time since onset of injury/illness/exacerbation, and 3+ comorbidities: AMS, HTN, Insomnia, Seizure, CVA, Vitamin D Deficiency  are also affecting patient's functional outcome.      REHAB POTENTIAL: Good   CLINICAL DECISION MAKING: Stable/uncomplicated   EVALUATION COMPLEXITY: Low   PLAN: PT FREQUENCY: 1x/week   PT DURATION: 8 weeks   PLANNED INTERVENTIONS: Therapeutic exercises, Therapeutic activity, Neuromuscular re-education, Balance training, Gait training, Patient/Family education, Joint manipulation, Joint mobilization, Stair training, Orthotic/Fit training, DME instructions, Wheelchair mobility training, Cryotherapy, Moist heat, and Manual therapy   PLAN FOR NEXT SESSION: Monitor BP. Add supine ex's to HEP. gait training with hemiwalker, trial AFOs for foot drop of RLE, work on standing balance, right LE stretching due to tone    KJones Bales PT, DPT [facility] [place] [place] [phone]/31/23, 1:22 PM

## 2021-08-31 ENCOUNTER — Telehealth: Payer: Self-pay

## 2021-08-31 NOTE — Telephone Encounter (Signed)
Bo Merino, NP    Carla Bauer was evaluated by Physical Therapy on 07/24/2021.  The patient would benefit from occupational therapy evaluation for RUE tone post CVA and ROM deficits.   If you agree, please place an order in [facility] in [facility] or fax the order to [phone].   Thank you, Guillermina City, PT, [place]  [address] [place], [place]  [address] Phone:  [phone] Fax:  [phone]

## 2021-08-31 NOTE — Telephone Encounter (Signed)
Carla Merino, NP,    Carla Bauer is being treated by physical therapy for Chronic CVA and Residual RLE/RUE spasticity. They will benefit from use of Manual Wheelchair in order to improve safety with functional mobility.    Please submit order in epic or fax the order to [phone].     Thank you, Guillermina City, PT, [place]  [address] [place], [place]  [address] Phone:  [phone] Fax:  [phone]

## 2021-09-01 ENCOUNTER — Other Ambulatory Visit: Payer: Self-pay | Admitting: Nurse Practitioner

## 2021-09-01 DIAGNOSIS — Z8673 Personal history of transient ischemic attack (TIA), and cerebral infarction without residual deficits: Secondary | ICD-10-CM

## 2021-09-05 NOTE — Telephone Encounter (Signed)
Hi Orion Crook, NP,   Thank you for placing the order for the wheelchair, however it was placed for an electric wheelchair. The patient needs a standard manual wheelchair. Can this please be updated so the patient can obtain the proper equipment.,   Thank you for your help,  Adelfa Koh, PT, DPT [facility]   [address] [place], [place]  [address] Phone:  [phone] Fax:  [phone]

## 2021-09-06 ENCOUNTER — Other Ambulatory Visit: Payer: Self-pay | Admitting: Nurse Practitioner

## 2021-09-06 ENCOUNTER — Ambulatory Visit: Payer: Medicare (Managed Care) | Attending: Nurse Practitioner

## 2021-09-06 DIAGNOSIS — R262 Difficulty in walking, not elsewhere classified: Secondary | ICD-10-CM | POA: Insufficient documentation

## 2021-09-06 DIAGNOSIS — M6281 Muscle weakness (generalized): Secondary | ICD-10-CM | POA: Insufficient documentation

## 2021-09-06 DIAGNOSIS — R2681 Unsteadiness on feet: Secondary | ICD-10-CM | POA: Insufficient documentation

## 2021-09-06 DIAGNOSIS — R2689 Other abnormalities of gait and mobility: Secondary | ICD-10-CM | POA: Insufficient documentation

## 2021-09-06 DIAGNOSIS — R531 Weakness: Secondary | ICD-10-CM

## 2021-09-12 ENCOUNTER — Ambulatory Visit: Payer: Medicaid Other

## 2021-09-13 ENCOUNTER — Telehealth: Payer: Self-pay

## 2021-09-13 ENCOUNTER — Ambulatory Visit: Payer: Medicare (Managed Care)

## 2021-09-13 VITALS — BP 115/89 | HR 82

## 2021-09-13 DIAGNOSIS — R2689 Other abnormalities of gait and mobility: Secondary | ICD-10-CM

## 2021-09-13 DIAGNOSIS — R2681 Unsteadiness on feet: Secondary | ICD-10-CM | POA: Diagnosis not present

## 2021-09-13 DIAGNOSIS — R262 Difficulty in walking, not elsewhere classified: Secondary | ICD-10-CM

## 2021-09-13 DIAGNOSIS — M6281 Muscle weakness (generalized): Secondary | ICD-10-CM

## 2021-09-13 NOTE — Therapy (Signed)
OUTPATIENT PHYSICAL THERAPY TREATMENT NOTE   Patient Name: Carla Bauer MRN: [id number] DOB:September 30, 1955, 66 y.o., female Today's Date: 09/13/2021  PCP: Vevelyn Francois, NP  REFERRING PROVIDER: Vevelyn Francois, NP   END OF SESSION:   PT End of Session - 09/13/21 1224     Visit Number 5    Number of Visits 9    Date for PT Re-Evaluation --   at 9th visit   Authorization Type Medicaid (VL: 27)    Authorization Time Period 5 PT visits approved 08/30/21 - 10/03/21    PT Start Time 1230    PT Stop Time 1312    PT Time Calculation (min) 42 min    Equipment Utilized During Treatment Gait belt    Activity Tolerance Patient tolerated treatment well    Behavior During Therapy WFL for tasks assessed/performed             Past Medical History:  Diagnosis Date   Altered mental status    Hypertension    Insomnia    Right sided weakness    Seizure-like activity (HCC)    Stroke (cerebrum) (HCC)    Vitamin D deficiency 08/2019   Past Surgical History:  Procedure Laterality Date   CESAREAN SECTION     CRANIOTOMY Left 08/14/2017   Procedure: 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;  Surgeon: Consuella Lose, MD;  Location: [place];  Service: Neurosurgery;  Laterality: Left;   IR GASTROSTOMY TUBE MOD SED  09/16/2017   IR GASTROSTOMY TUBE REMOVAL  12/06/2017   LAPAROSCOPIC REVISION VENTRICULAR-PERITONEAL (V-P) SHUNT N/A 08/29/2017   Procedure: LAPAROSCOPIC REVISION VENTRICULAR-PERITONEAL (V-P) SHUNT;  Surgeon: Coralie Keens, MD;  Location: [place];  Service: General;  Laterality: N/A;  LAPAROSCOPIC REVISION VENTRICULAR-PERITONEAL (V-P) SHUNT   VENTRICULOPERITONEAL SHUNT Right 08/29/2017   Procedure: SHUNT INSERTION VENTRICULAR-PERITONEAL;  Surgeon: Consuella Lose, MD;  Location: [facility];  Service: Neurosurgery;  Laterality: Right;  SHUNT INSERTION VENTRICULAR-PERITONEAL   Patient Active Problem List   Diagnosis Date Noted   S/P stroke due to  cerebrovascular disease 08/21/2019   Decreased ambulation status 08/21/2019   Benign essential HTN    Hypernatremia    Dysphagia due to recent stroke    Spastic hemiparesis (HCC)    S/P percutaneous endoscopic gastrostomy (PEG) tube placement (HCC)    Labile blood glucose    Sinus tachycardia    Intraparenchymal hematoma of brain ([place]) 09/03/2017   Right hemiparesis (HCC)    Oropharyngeal dysphagia    Essential hypertension    Acute lower UTI    Tachypnea    Tachycardia    Reactive hypertension    Prediabetes    Acute blood loss anemia    Hypokalemia    Fever    Leukocytosis    Acute respiratory failure with hypoxia (HCC)    Cerebrovascular accident (CVA) due to thrombosis of left posterior cerebral artery (HCC)    ICH (intracerebral hemorrhage) ([place]) 08/14/2017   SAH (subarachnoid hemorrhage) ([place]) 08/14/2017   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    PRECAUTIONS: Fall  SUBJECTIVE: Patient reports no new changes. No falls to report.  PAIN:  Are you having pain? No  Vitals:   09/13/21 1240 09/13/21 1310  BP: 122/74 115/89  Pulse: 82 82    OBJECTIVE: Pt arrived to session with Rollator and no brake on Right side. PT immediately moved patient from rollator to Neuro Rehab Wheelchair for safety concerns.  PT educated granddaughter on safety concerns and plan to obtain patient a manual  wheelchair.   TRANSFERS:   Assistive device utilized: Hemi-Walker Sit to stand: CGA - cues needed to scoot to edge of surface, for right foot placement, to push from the surface with left UE and for weight shifting to stand up. PT helping facilitate improved standing posture.  Stand to sit: CGA Stand Pivot Transfer: Completed with Hemi- Walker and CGA - Min A, cues on sequencing and technique with transfer wheelchair <> mat table.      GAIT: Gait pattern: step to pattern, decreased step length- Right, decreased step length- Left, decreased stance time- Right, decreased ankle dorsiflexion- Right, lateral lean- Left, and poor foot clearance- Right Distance walked: 115'  Assistive device utilized: Hemi-Walker Level of assistance: Min A for RLE Comments: completed ambulation with hemi-walker, with PT assisting on R side knee blocking recurvatum noted. Cues for step length and facilitation for weight shift and promoting improved stance on RLE. Fatigue noted at end requiring seated rest break. Patient able to ambulate without rest break today, only needed at end of session.   NMR Completed bridges, assistance required maintaining RLE in position for activity, completed 2 x 10 reps with facilitation and cues required.    With patient supine, completed heel slides with pillowcase under RLE, completed 3 x 5 reps. Intermittent rest breaks required due to fatigue in RLE. Increased tone requiring facilitation from PT.   BP after activity: 115/89  PATIENT EDUCATION: Education details: Continue HEP; Plan to [place] educated: Patient Education method: Explanation and handout Education comprehension: verbalized understanding and needs further education      HOME EXERCISE PROGRAM: Access Code: [email or web address] URL: [email or web address] Date: 08/16/2021 Prepared by: Willow Ora  Exercises - Proper Sit to Stand Technique  - 1 x daily - 7 x weekly - 3 sets - 3-5 reps - Seated Heel  Raise  - 1 x daily - 5 x weekly - 1 sets - 10 reps - Seated Long Arc Quad  - 1 x daily - 5 x weekly - 1 sets - 10 reps - Seated Knee Flexion Slide  - 1 x daily - 5 x weekly - 1 sets - 10 reps     GOALS: Goals reviewed with patient? Yes   SHORT TERM GOALS: Target date: at 5th Visit   Pt will be independent with initial HEP for improved strengthening with caregiver/family member supervision  Baseline: no HEP established; reports independence with current HEP Goal status: MET   2.  Pt will improve 5x STS to </= 30 sec to demo improved functional LE strength and balance  Baseline: 34.53 seconds with single UE support; 31.21 seconds Goal status: ON-GOING   3.  Pt will be able to ambulate >/= 100 ft with RW and CGA for improved household mobility Baseline: 45 ft Min A; 40 ft with Min A Goal status:NOT MET   4.  Gait Speed TBA and STG/LTG to be set as applicable Baseline: TBA; unable to ambulate distance to obtain gait speed Goal status: DEFERRED  LONG TERM GOALS: Target date: at 9th Visit   Pt will be independent with final HEP for improved strengthening with caregiver/family member supervision  Baseline: no HEP established Goal status: INITIAL   2.  Pt will improve 5x STS to </= 25 sec to demo improved functional LE strength and balance  Baseline: 34.53 seconds with single UE support Goal status: INITIAL   3.  LTG to be set for Gait Speed as applicable Baseline: TBA Goal status: INITIAL   4.  Pt will be able to ambulate >/= 200 ft indoors with RW and CGA to demo improved household ambulation  Baseline: 72f Min A Goal status: INITIAL   5.  Pt will be able to complete stand pivot transfer with RW with supervision to demo improved safety with functional mobility Baseline:  Goal status: INITIAL   ASSESSMENT:   CLINICAL IMPRESSION: Today's skilled PT session focused on continued gait training with Hemi-Walker, patient able to ambulate 115' today prior to needing rest  break. Min A required for RLE. May benefit from further AFO assessment. Continued activities for RLE strengthening. Will continue per POC.    OBJECTIVE IMPAIRMENTS Abnormal gait, decreased activity tolerance, decreased balance, decreased coordination, decreased endurance, decreased knowledge of use of DME, decreased mobility, difficulty walking, decreased strength, decreased safety awareness, hypomobility, impaired flexibility, impaired tone, and postural dysfunction.    ACTIVITY LIMITATIONS cleaning, meal prep, and medication management.    PERSONAL FACTORS Age, Time since onset of injury/illness/exacerbation, and 3+ comorbidities: AMS, HTN, Insomnia, Seizure, CVA, Vitamin D Deficiency  are also affecting patient's functional outcome.      REHAB POTENTIAL: Good   CLINICAL DECISION MAKING: Stable/uncomplicated   EVALUATION COMPLEXITY: Low   PLAN: PT FREQUENCY: 1x/week   PT DURATION: 8 weeks   PLANNED INTERVENTIONS: Therapeutic exercises, Therapeutic activity, Neuromuscular re-education, Balance training, Gait training, Patient/Family education, Joint manipulation, Joint mobilization, Stair training, Orthotic/Fit training, DME instructions, Wheelchair mobility training, Cryotherapy, Moist heat, and Manual therapy   PLAN FOR NEXT SESSION: . Will need to schedule additional visits. Authorization for 5 through 10/03/21. Monitor BP. Add supine ex's to HEP. gait training with hemiwalker, trial AFOs for foot drop of RLE, work on standing balance, right LE stretching due to tone    KJones Bales PT, DPT [facility]patient [facility] [place] [phone]/14/23, 2:56 PM

## 2021-09-13 NOTE — Telephone Encounter (Signed)
Orion Crook, NP,  Thank you for placing the order for the Manual Wheelchair for Carla Bauer. You are scheduled to see this patient on 09/15/2021 for a face to face visit. To allow for insurance coverage of the wheelchair, can you please state the medical necessity in your documentation for improved safety with household and community mobility.    Due to gait abnormalities, the patient has a potential need for AFO, we are beginning to trial these bracing options in session. If able, please also state the need for bracing options as well in the case we determine to obtain patient a personal AFO.   Thank you for all you do,   Adelfa Koh, PT, DPT   [facility] [address] [place], [place]  [address] Phone:  [phone] Fax:  [phone]

## 2021-09-14 NOTE — Telephone Encounter (Signed)
For Insurance coverage, it is required that they have documentation from the PCP or MD, stating the need for the wheelchair. We also send our medical notes as well, however the patient is unable to get the equipment, if we do not have documentation from the PCP. Hope this makes sense.   Thank you again!

## 2021-09-15 ENCOUNTER — Ambulatory Visit: Payer: Medicaid Other | Admitting: Nurse Practitioner

## 2021-09-20 ENCOUNTER — Ambulatory Visit: Payer: Medicare (Managed Care)

## 2021-10-04 ENCOUNTER — Other Ambulatory Visit: Payer: Self-pay

## 2021-10-04 DIAGNOSIS — I10 Essential (primary) hypertension: Secondary | ICD-10-CM

## 2021-10-05 MED ORDER — AMLODIPINE BESYLATE 5 MG PO TABS: 5.0000 mg | ORAL_TABLET | Freq: Every day | ORAL | 0 refills | Status: DC

## 2021-10-05 MED ORDER — METOPROLOL TARTRATE 100 MG PO TABS: 100.0000 mg | ORAL_TABLET | Freq: Two times a day (BID) | ORAL | 0 refills | Status: DC

## 2021-10-10 ENCOUNTER — Other Ambulatory Visit: Payer: Self-pay | Admitting: Family Medicine

## 2021-10-10 ENCOUNTER — Telehealth: Payer: Self-pay | Admitting: Family Medicine

## 2021-10-10 DIAGNOSIS — I10 Essential (primary) hypertension: Secondary | ICD-10-CM

## 2021-10-10 MED ORDER — AMLODIPINE BESYLATE 5 MG PO TABS: 5.0000 mg | ORAL_TABLET | Freq: Every day | ORAL | 0 refills | Status: DC

## 2021-10-10 MED ORDER — METOPROLOL TARTRATE 100 MG PO TABS: 100.0000 mg | ORAL_TABLET | Freq: Two times a day (BID) | ORAL | 0 refills | Status: DC

## 2021-10-10 NOTE — Progress Notes (Signed)
Meds ordered this encounter  Medications   metoprolol tartrate (LOPRESSOR) 100 MG tablet    Sig: Take 1 tablet (100 mg total) by mouth 2 (two) times daily.    Dispense:  180 tablet    Refill:  0    Order Specific Question:   Supervising Provider    Answer:   Quentin Angst [[id number]]   amLODipine (NORVASC) 5 MG tablet    Sig: Take 1 tablet (5 mg total) by mouth daily.    Dispense:  180 tablet    Refill:  0    Order Specific Question:   Supervising Provider    Answer:   Quentin Angst [[id number]]   Nolon Nations  APRN, MSN, FNP-C Patient Care [facility] [address] [place], [place] [address] [phone]

## 2021-10-10 NOTE — Telephone Encounter (Signed)
RX Request from [facility] on [address]  Metoprolol 100mg  Amlodipine 5mg 

## 2021-10-11 NOTE — Telephone Encounter (Signed)
Error

## 2021-10-17 ENCOUNTER — Other Ambulatory Visit: Payer: Self-pay

## 2021-10-17 DIAGNOSIS — I10 Essential (primary) hypertension: Secondary | ICD-10-CM

## 2021-10-17 MED ORDER — AMLODIPINE BESYLATE 5 MG PO TABS: 5.0000 mg | ORAL_TABLET | Freq: Every day | ORAL | 0 refills | Status: DC

## 2021-10-26 DIAGNOSIS — Z982 Presence of cerebrospinal fluid drainage device: Secondary | ICD-10-CM | POA: Diagnosis not present

## 2021-10-26 DIAGNOSIS — G8110 Spastic hemiplegia affecting unspecified side: Secondary | ICD-10-CM | POA: Diagnosis not present

## 2021-10-26 DIAGNOSIS — I509 Heart failure, unspecified: Secondary | ICD-10-CM | POA: Diagnosis not present

## 2021-10-26 DIAGNOSIS — F17220 Nicotine dependence, chewing tobacco, uncomplicated: Secondary | ICD-10-CM | POA: Diagnosis not present

## 2021-10-26 DIAGNOSIS — Z1159 Encounter for screening for other viral diseases: Secondary | ICD-10-CM | POA: Diagnosis not present

## 2021-10-26 DIAGNOSIS — I10 Essential (primary) hypertension: Secondary | ICD-10-CM | POA: Diagnosis not present

## 2021-10-26 DIAGNOSIS — Z8673 Personal history of transient ischemic attack (TIA), and cerebral infarction without residual deficits: Secondary | ICD-10-CM | POA: Diagnosis not present

## 2021-10-26 DIAGNOSIS — Z79899 Other long term (current) drug therapy: Secondary | ICD-10-CM | POA: Diagnosis not present

## 2021-10-26 DIAGNOSIS — Z0000 Encounter for general adult medical examination without abnormal findings: Secondary | ICD-10-CM | POA: Diagnosis not present

## 2021-10-26 DIAGNOSIS — I771 Stricture of artery: Secondary | ICD-10-CM | POA: Diagnosis not present

## 2021-11-06 ENCOUNTER — Ambulatory Visit: Payer: Medicaid Other | Admitting: Nurse Practitioner

## 2021-12-14 ENCOUNTER — Ambulatory Visit: Payer: Medicare (Managed Care) | Attending: Nurse Practitioner | Admitting: Physical Therapy

## 2021-12-14 DIAGNOSIS — I69351 Hemiplegia and hemiparesis following cerebral infarction affecting right dominant side: Secondary | ICD-10-CM | POA: Diagnosis not present

## 2021-12-15 ENCOUNTER — Ambulatory Visit: Payer: Medicaid Other | Admitting: Nurse Practitioner

## 2021-12-15 ENCOUNTER — Encounter: Payer: Self-pay | Admitting: Physical Therapy
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
## 2022-01-19 DIAGNOSIS — G8191 Hemiplegia, unspecified affecting right dominant side: Secondary | ICD-10-CM | POA: Diagnosis not present

## 2022-03-06 ENCOUNTER — Telehealth: Payer: Self-pay

## 2022-03-06 NOTE — Telephone Encounter (Signed)
Called pt to schedule a wheel chair face to face visit. No answer and lvm for pt o call back . Forms are in hold folder until appointment. Kh

## 2022-04-30 ENCOUNTER — Ambulatory Visit: Payer: Self-pay | Admitting: Nurse Practitioner

## 2022-05-30 ENCOUNTER — Other Ambulatory Visit: Payer: Self-pay | Admitting: Nurse Practitioner

## 2022-05-30 ENCOUNTER — Encounter: Payer: Self-pay | Admitting: Nurse Practitioner

## 2022-05-30 ENCOUNTER — Ambulatory Visit (INDEPENDENT_AMBULATORY_CARE_PROVIDER_SITE_OTHER): Payer: 59 | Admitting: Nurse Practitioner

## 2022-05-30 VITALS — BP 135/80 | HR 82 | Temp 97.9°F | Ht 61.0 in | Wt 167.8 lb

## 2022-05-30 DIAGNOSIS — I10 Essential (primary) hypertension: Secondary | ICD-10-CM

## 2022-05-30 DIAGNOSIS — Z1231 Encounter for screening mammogram for malignant neoplasm of breast: Secondary | ICD-10-CM

## 2022-05-30 DIAGNOSIS — E559 Vitamin D deficiency, unspecified: Secondary | ICD-10-CM

## 2022-05-30 DIAGNOSIS — Z1211 Encounter for screening for malignant neoplasm of colon: Secondary | ICD-10-CM

## 2022-05-30 DIAGNOSIS — Z7409 Other reduced mobility: Secondary | ICD-10-CM

## 2022-05-30 DIAGNOSIS — Z8673 Personal history of transient ischemic attack (TIA), and cerebral infarction without residual deficits: Secondary | ICD-10-CM | POA: Diagnosis not present

## 2022-05-30 DIAGNOSIS — Z789 Other specified health status: Secondary | ICD-10-CM

## 2022-05-30 DIAGNOSIS — Z1329 Encounter for screening for other suspected endocrine disorder: Secondary | ICD-10-CM

## 2022-05-30 DIAGNOSIS — R7303 Prediabetes: Secondary | ICD-10-CM

## 2022-05-30 MED ORDER — METOPROLOL TARTRATE 100 MG PO TABS: 100.0000 mg | ORAL_TABLET | Freq: Two times a day (BID) | ORAL | 0 refills | Status: DC

## 2022-05-30 MED ORDER — AMLODIPINE BESYLATE 5 MG PO TABS
5.0000 mg | ORAL_TABLET | Freq: Every day | ORAL | 0 refills | Status: DC
Start: 2022-05-30 — End: 2022-12-21

## 2022-05-30 NOTE — Assessment & Plan Note (Signed)
Last vitamin D Lab Results  Component Value Date   VD25OH 8.4 (L) 08/19/2019  She is currently not on vitamin D supplement we will check vitamin D levels

## 2022-05-30 NOTE — Patient Instructions (Addendum)
Please get your flu vaccine, Tdap vaccine, shingles vaccine, and pneumonia vaccine at your pharmacy. Please call [facility] and [phone] to schedule your mammogram.   It is important that you exercise regularly at least 30 minutes 5 times a week as tolerated  Think about what you will eat, plan ahead. Choose " clean, green, fresh or frozen" over canned, processed or packaged foods which are more sugary, salty and fatty. 70 to 75% of food eaten should be vegetables and fruit. Three meals at set times with snacks allowed between meals, but they must be fruit or vegetables. Aim to eat over a 12 hour period , example 7 am to 7 pm, and STOP after  your last meal of the day. Drink water,generally about 64 ounces per day, no other drink is as healthy. Fruit juice is best enjoyed in a healthy way, by EATING the fruit.  Thanks for choosing [facility] we consider it a privelige to serve you.

## 2022-05-30 NOTE — Assessment & Plan Note (Signed)
She is currently not on a statin we will check fasting lipid panel Rollator and wheelchair ordered

## 2022-05-30 NOTE — Assessment & Plan Note (Signed)
BP Readings from Last 3 Encounters:  05/30/22 135/80  09/13/21 115/89  08/30/21 122/83  Amlodipine 5 mg daily, metoprolol 100 mg twice daily were refilled today. DASH diet advised engage in regular daily exercises as tolerated Checking CMP Follow-up in 4 months

## 2022-05-30 NOTE — Progress Notes (Signed)
New Patient Office Visit  Subjective:  Patient ID: Carla Bauer, female    DOB: 1956-02-16  Age: 67 y.o. MRN: [id number]  CC:  Chief Complaint  Patient presents with   Follow-up    Hypertension.    HPI Carla Bauer is a 67 y.o. female  has a past medical history of Altered mental status, Hypertension, Insomnia, Right sided weakness, Seizure-like activity ([place]), Stroke (cerebrum) ([place]), and Vitamin D deficiency (08/2019).  She  presents for follow-up for her chronic medical conditions.  Hypertension.  Currently on amlodipine 5 mg daily, metoprolol 100 mg twice daily.  Needs refill of amlodipine stated that she has been out of this medication.  She denies shortness of breath, dizziness, edema   CVA/impaired mobility and ADLs .she is requesting for a new wheelchair and rolling walker.  She has right arm paralysis, right leg weakness unable to ambulate without a walker.  She is currently living at home with her children.  She is currently not on a statin fasting lipid panel ordered.   Due for pneumonia vaccine, flu vaccine, Tdap vaccine, shingles vaccine need for all vaccines were discussed she declined flu vaccine in the office today.  Cologuard ordered to screen for colon cancer, screening mammogram ordered.  Patient currently denies fever, chills, unintentional weight loss, chest pain, shortness of breath, palpitation, wheezing, cough, abdominal pain, nausea, vomiting.  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;  Surgeon: Consuella Lose, MD;  Location: [facility];   Service: Neurosurgery;  Laterality: Left;   IR GASTROSTOMY TUBE MOD SED  09/16/2017   IR GASTROSTOMY TUBE REMOVAL  12/06/2017   LAPAROSCOPIC REVISION VENTRICULAR-PERITONEAL (V-P) SHUNT N/A 08/29/2017   Procedure: LAPAROSCOPIC REVISION VENTRICULAR-PERITONEAL (V-P) SHUNT;  Surgeon: Coralie Keens, MD;  Location: [facility];  Service: General;  Laterality: N/A;  LAPAROSCOPIC REVISION VENTRICULAR-PERITONEAL (V-P) SHUNT   VENTRICULOPERITONEAL SHUNT Right 08/29/2017   Procedure: SHUNT INSERTION VENTRICULAR-PERITONEAL;  Surgeon: Consuella Lose, MD;  Location: [facility];  Service: Neurosurgery;  Laterality: Right;  SHUNT INSERTION VENTRICULAR-PERITONEAL    Family History  Problem Relation Age of Onset   Hypertension Mother     Social History   Socioeconomic History   Marital status: Single    Spouse name: Not on file   Number of children: 3   Years of education: Not on file   Highest education level: Not on file  Occupational History   Not on file  Tobacco Use   Smoking status: Never   Smokeless tobacco: Current    Types: Snuff  Vaping Use   Vaping Use: Never used  Substance and Sexual Activity   Alcohol use: No   Drug use: No   Sexual activity: Not Currently  Other Topics Concern   Not on file  Social History Narrative   Lives with her children   Social Determinants of Health   Financial Resource Strain: Not on file  Food Insecurity: Not on file  Transportation Needs: Unmet Transportation Needs (06/13/2021)   [place] - Transportation  Lack of Transportation (Medical): Yes    Lack of Transportation (Non-Medical): Yes  Physical Activity: Not on file  Stress: Not on file  Social Connections: Not on file  Intimate Partner Violence: Not on file    ROS Review of Systems  Constitutional: Negative.   Respiratory: Negative.    Cardiovascular: Negative.   Gastrointestinal: Negative.   Musculoskeletal:  Positive for gait problem.  Neurological:  Positive for dizziness, seizures,  facial asymmetry, weakness and headaches.  Psychiatric/Behavioral: Negative.      Objective:   Today's Vitals: BP 135/80   Pulse 82   Temp 97.9 F (36.6 C)   Ht '5\' 1"'$  (1.549 m)   Wt 167 lb 12.8 oz (76.1 kg)   SpO2 100%   BMI 31.71 kg/m   Physical Exam Constitutional:      General: She is not in acute distress.    Appearance: She is not ill-appearing, toxic-appearing or diaphoretic.  Eyes:     General: No scleral icterus.       Right eye: No discharge.        Left eye: No discharge.     Extraocular Movements: Extraocular movements intact.  Cardiovascular:     Rate and Rhythm: Normal rate and regular rhythm.     Pulses: Normal pulses.     Heart sounds: Normal heart sounds. No murmur heard.    No friction rub. No gallop.  Pulmonary:     Effort: Pulmonary effort is normal. No respiratory distress.     Breath sounds: Normal breath sounds. No stridor. No wheezing, rhonchi or rales.  Chest:     Chest wall: No tenderness.  Abdominal:     General: There is no distension.     Tenderness: There is no abdominal tenderness. There is no guarding.  Musculoskeletal:        General: Deformity present.     Right lower leg: No edema.     Left lower leg: No edema.     Comments: Right upper arm paralysis ,right leg weakness  Skin:    General: Skin is warm and dry.  Neurological:     Mental Status: She is alert and oriented to person, place, and time.     Motor: Weakness present.  Psychiatric:        Mood and Affect: Mood normal.        Behavior: Behavior normal.        Thought Content: Thought content normal.        Judgment: Judgment normal.     Assessment & Plan:   Problem List Items Addressed This Visit       Cardiovascular and Mediastinum   Essential hypertension - Primary    BP Readings from Last 3 Encounters:  05/30/22 135/80  09/13/21 115/89  08/30/21 122/83  Amlodipine 5 mg daily, metoprolol 100 mg twice daily were refilled today. 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    She is currently not on a statin we will check fasting lipid panel Rollator and wheelchair ordered      Relevant Orders   Lipid panel   Impaired mobility and ADLs    Patient needs a wheelchair and  rollator to assist with mobility  - DME Wheelchair manual - For home use only DME 4 wheeled rolling walker with seat [id number])       Relevant Orders   DME Wheelchair manual   For home use only DME 4 wheeled rolling walker with seat [id number])   Vitamin D deficiency    Last vitamin D Lab Results  Component Value Date   VD25OH 8.4 (L) 08/19/2019  She is currently not on vitamin D supplement we will check vitamin D levels      Relevant Orders   Vitamin D, 25-hydroxy   Other Visit Diagnoses     Screening for colon cancer       Relevant Orders   Cologuard   Encounter for screening mammogram for malignant neoplasm of breast       Relevant Orders   MM Digital Screening       Outpatient Encounter Medications as of 05/30/2022  Medication Sig   [DISCONTINUED] amLODipine (NORVASC) 5 MG tablet Take 1 tablet (5 mg total) by mouth daily.   [DISCONTINUED] metoprolol tartrate (LOPRESSOR) 100 MG tablet Take 1 tablet (100 mg total) by mouth 2 (two) times daily.   amLODipine (NORVASC) 5 MG tablet Take 1 tablet (5 mg total) by mouth daily.   blood glucose meter kit and supplies KIT Dispense based on patient and insurance preference. Daily monitoring (Patient not taking: Reported on 05/30/2022)   metoprolol tartrate (LOPRESSOR) 100 MG tablet Take 1 tablet (100 mg total) by mouth 2 (two) times daily.   Misc. Devices (ROLLATOR ULTRA-LIGHT) MISC 1 application by Does not apply route daily. (Patient not  taking: Reported on 05/30/2022)   No facility-administered encounter medications on file as of 05/30/2022.    Follow-up: Return in about 4 months (around 09/28/2022) for CPE.   Renee Rival, FNP

## 2022-05-30 NOTE — Assessment & Plan Note (Signed)
Patient needs a wheelchair and rollator to assist with mobility  - DME Wheelchair manual - For home use only DME 4 wheeled rolling walker with seat [id number])

## 2022-06-04 ENCOUNTER — Other Ambulatory Visit: Payer: Self-pay

## 2022-06-04 ENCOUNTER — Telehealth: Payer: Self-pay

## 2022-06-04 DIAGNOSIS — Z7409 Other reduced mobility: Secondary | ICD-10-CM

## 2022-06-04 NOTE — Telephone Encounter (Signed)
Pending

## 2022-06-05 ENCOUNTER — Other Ambulatory Visit: Payer: Self-pay | Admitting: Pharmacist

## 2022-06-05 ENCOUNTER — Telehealth: Payer: Self-pay

## 2022-06-06 ENCOUNTER — Other Ambulatory Visit: Payer: Self-pay

## 2022-06-06 NOTE — Progress Notes (Signed)
Care Coordination Call  Received message from Carlota Raspberry that amlodipine and metoprolol were not running through at the pharmacy. Contacted the pharmacy. They note that Medicaid is rejecting as Medicaid has that patient also has a Medicare plan. Patient needs to provide the Medicare information to her pharmacy.   Catie Hedwig Morton, PharmD, [place], [facility] [phone]

## 2022-06-08 NOTE — Progress Notes (Signed)
Pt call today to check in regard medication. Advise pt to call medicaid and social services to get help enrolling in medicare pt at this moment she just had medicaid, and pharmacy will need medicare in order for pt not to pay medication out off pocket. Pt to call social cervices today. She will call back to our office to follow up. Pope

## 2022-06-13 NOTE — Telephone Encounter (Signed)
Done

## 2022-07-12 ENCOUNTER — Telehealth: Payer: Self-pay

## 2022-07-12 NOTE — Telephone Encounter (Signed)
Pt was advised that  we are waiting to hear back from [employer] concerting her wheel chair . They did say that she is not eligible right now but I let them know hers is broken. Still waiting on a reply/. KH

## 2022-07-13 ENCOUNTER — Telehealth: Payer: Self-pay | Admitting: Nurse Practitioner

## 2022-07-13 ENCOUNTER — Other Ambulatory Visit: Payer: Self-pay | Admitting: Nurse Practitioner

## 2022-07-13 DIAGNOSIS — I10 Essential (primary) hypertension: Secondary | ICD-10-CM

## 2022-07-13 MED ORDER — METOPROLOL TARTRATE 100 MG PO TABS: 100.0000 mg | ORAL_TABLET | Freq: Two times a day (BID) | ORAL | 0 refills | Status: DC

## 2022-07-13 NOTE — Telephone Encounter (Signed)
Pt called and was asking about her meds and needs the [facility] to call her as well.Marland KitchenMarland Kitchen

## 2022-09-25 ENCOUNTER — Ambulatory Visit: Payer: 59 | Admitting: Podiatry

## 2022-09-28 ENCOUNTER — Ambulatory Visit: Payer: Self-pay | Admitting: Nurse Practitioner

## 2022-10-02 ENCOUNTER — Other Ambulatory Visit: Payer: Self-pay | Admitting: Nurse Practitioner

## 2022-10-02 DIAGNOSIS — I10 Essential (primary) hypertension: Secondary | ICD-10-CM

## 2022-10-09 NOTE — Telephone Encounter (Signed)
Pt will come in next week. KH

## 2022-10-10 ENCOUNTER — Other Ambulatory Visit: Payer: Self-pay | Admitting: Family Medicine

## 2022-10-10 DIAGNOSIS — M064 Inflammatory polyarthropathy: Secondary | ICD-10-CM

## 2022-10-10 DIAGNOSIS — Z1231 Encounter for screening mammogram for malignant neoplasm of breast: Secondary | ICD-10-CM

## 2022-10-17 ENCOUNTER — Ambulatory Visit: Payer: Self-pay | Admitting: Nurse Practitioner

## 2022-10-30 ENCOUNTER — Other Ambulatory Visit: Payer: Self-pay | Admitting: Nurse Practitioner

## 2022-10-30 DIAGNOSIS — I10 Essential (primary) hypertension: Secondary | ICD-10-CM

## 2022-10-31 ENCOUNTER — Other Ambulatory Visit: Payer: Self-pay

## 2022-10-31 DIAGNOSIS — I10 Essential (primary) hypertension: Secondary | ICD-10-CM

## 2022-10-31 MED ORDER — METOPROLOL TARTRATE 100 MG PO TABS: 100.0000 mg | ORAL_TABLET | Freq: Two times a day (BID) | ORAL | 0 refills | Status: DC

## 2022-11-01 ENCOUNTER — Other Ambulatory Visit: Payer: Self-pay | Admitting: Nurse Practitioner

## 2022-11-01 DIAGNOSIS — I10 Essential (primary) hypertension: Secondary | ICD-10-CM

## 2022-12-17 ENCOUNTER — Other Ambulatory Visit: Payer: Self-pay | Admitting: Nurse Practitioner

## 2022-12-17 DIAGNOSIS — I10 Essential (primary) hypertension: Secondary | ICD-10-CM

## 2022-12-17 MED ORDER — METOPROLOL TARTRATE 100 MG PO TABS
100.0000 mg | ORAL_TABLET | Freq: Two times a day (BID) | ORAL | 0 refills | Status: DC
Start: 2022-12-17 — End: 2023-10-07

## 2022-12-18 ENCOUNTER — Other Ambulatory Visit: Payer: Self-pay

## 2022-12-18 DIAGNOSIS — I10 Essential (primary) hypertension: Secondary | ICD-10-CM

## 2022-12-18 MED ORDER — METOPROLOL TARTRATE 100 MG PO TABS: 100.0000 mg | ORAL_TABLET | Freq: Two times a day (BID) | ORAL | 0 refills | Status: AC

## 2022-12-20 ENCOUNTER — Other Ambulatory Visit: Payer: Self-pay | Admitting: Nurse Practitioner

## 2022-12-20 ENCOUNTER — Other Ambulatory Visit: Payer: Self-pay | Admitting: Family Medicine

## 2022-12-21 ENCOUNTER — Other Ambulatory Visit: Payer: Self-pay | Admitting: Nurse Practitioner

## 2022-12-21 DIAGNOSIS — I10 Essential (primary) hypertension: Secondary | ICD-10-CM

## 2022-12-26 ENCOUNTER — Other Ambulatory Visit: Payer: Self-pay | Admitting: Nurse Practitioner

## 2022-12-26 DIAGNOSIS — I10 Essential (primary) hypertension: Secondary | ICD-10-CM

## 2023-01-01 ENCOUNTER — Other Ambulatory Visit: Payer: Self-pay

## 2023-01-01 DIAGNOSIS — I10 Essential (primary) hypertension: Secondary | ICD-10-CM

## 2023-01-01 MED ORDER — AMLODIPINE BESYLATE 5 MG PO TABS: 5.0000 mg | ORAL_TABLET | Freq: Every day | ORAL | 0 refills | Status: DC

## 2023-01-04 ENCOUNTER — Other Ambulatory Visit: Payer: Self-pay

## 2023-01-04 DIAGNOSIS — I10 Essential (primary) hypertension: Secondary | ICD-10-CM

## 2023-01-04 MED ORDER — AMLODIPINE BESYLATE 5 MG PO TABS: 5.0000 mg | ORAL_TABLET | Freq: Every day | ORAL | 0 refills | Status: DC

## 2023-05-07 ENCOUNTER — Ambulatory Visit: Payer: Self-pay | Admitting: Nurse Practitioner

## 2023-05-30 ENCOUNTER — Telehealth: Payer: Self-pay | Admitting: Nurse Practitioner

## 2023-05-31 NOTE — Telephone Encounter (Signed)
Please disreguard ?

## 2023-06-18 ENCOUNTER — Ambulatory Visit: Payer: Self-pay | Admitting: Nurse Practitioner

## 2023-07-10 ENCOUNTER — Ambulatory Visit: Payer: Self-pay | Admitting: Nurse Practitioner

## 2023-07-10 ENCOUNTER — Telehealth: Payer: Self-pay | Admitting: Nurse Practitioner

## 2023-07-10 NOTE — Telephone Encounter (Signed)
 Copied from CRM [id number]. Topic: General - Transportation >> Jul 10, 2023 12:53 PM Victorino Dike T wrote: Reason for CRM: need to speak with someone about transportation- [phone]  Call patient in reference to her message, she can call the number at the back of her Medicaid to set up her transpertation
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
## 2023-07-11 ENCOUNTER — Other Ambulatory Visit: Payer: Self-pay | Admitting: Licensed Clinical Social Worker

## 2023-07-11 NOTE — Telephone Encounter (Signed)
 Message sent to the provider and Gwyndolyn Saxon for advice. KH

## 2023-07-11 NOTE — Patient Instructions (Signed)
 Visit Information  Thank you for taking time to visit with me today. Please don't hesitate to contact me if I can be of assistance to you before our next scheduled appointment.   Patient has met all care management goals. Care Management case will be closed. Patient has been provided contact information should new needs arise.   Please call the care guide team at [phone] if you need to cancel, schedule, or reschedule an appointment.   Please call 911 if you are experiencing a Mental Health or Behavioral Health Crisis or need someone to talk to.  Gwyndolyn Saxon MSW, LCSW Licensed Clinical Social Worker  [facility], Population Health Direct Dial: [phone]  Fax: [phone]

## 2023-07-11 NOTE — Telephone Encounter (Signed)
 Spoke with Sue Lush via teams. We both agreed this was an emergency and going forward referrals will be sent. Provider was out and wanted info on how to help patient. Kh

## 2023-07-11 NOTE — Patient Outreach (Signed)
 Complex Care Management   Visit Note  07/11/2023  Name:  Carla Bauer MRN: [id number] DOB: 1955/07/08  Situation: Referral received for Complex Care Management related to SDOH Barriers:  Housing homeless  I obtained verbal consent from D Ou.  Visit completed with D Easton  on the phone  Background:   Past Medical History:  Diagnosis Date   Altered mental status    Hypertension    Insomnia    Right sided weakness    Seizure-like activity ([facility])    Stroke (cerebrum) ([facility])    Vitamin D deficiency 08/2019    Assessment: Patient Reported Symptoms:  Cognitive    Neurological      HEENT      Cardiovascular      Respiratory      Endocrine      Gastrointestinal      Genitourinary      Integumentary      Musculoskeletal      Psychosocial       There were no vitals filed for this visit.  Medications Reviewed Today   Medications were not reviewed in this encounter     Recommendation:   Contact partners ending homeless for possible assistance. APS reports filed due to patient's reports urgent homeless report. Pt reports she was residing at [facility] and asked to leave due to inability to pay for rooming. Due to medical history and sense or urgency with homelessness aps report was made with [facility] DSS. LCSW spk with intake social worker Edsel Petrin.    Follow Up Plan:   Closing From:  Complex Care Management  Gwyndolyn Saxon MSW, LCSW Licensed Clinical Social Worker  [facility], Population Health Direct Dial: [phone]  Fax: [phone]

## 2023-07-30 ENCOUNTER — Ambulatory Visit: Admitting: Nurse Practitioner

## 2023-09-24 ENCOUNTER — Telehealth: Payer: Self-pay | Admitting: Nurse Practitioner

## 2023-09-24 NOTE — Telephone Encounter (Signed)
 Copied from CRM [id number]. Topic: General - Other >> Sep 24, 2023  3:58 PM [place] B wrote: Reason for CRM: pt called in says she needs to speak with Social worker for assistance with a place to live. The payment in the hotels is too much. Please cb

## 2023-09-26 NOTE — Telephone Encounter (Signed)
 Pt is now in [place] , [place] with family and advising she wants to come back to [place]. Pt was advise we are not able to help with that move and she should reach out to her daughters . Once she is back in [place] we can look into helping with housing. KH

## 2023-10-07 ENCOUNTER — Other Ambulatory Visit: Payer: Self-pay | Admitting: Nurse Practitioner

## 2023-10-07 DIAGNOSIS — I10 Essential (primary) hypertension: Secondary | ICD-10-CM

## 2023-10-07 NOTE — Telephone Encounter (Unsigned)
 Copied from CRM [id number]. Topic: Clinical - Medication Refill >> Oct 07, 2023  2:13 PM [name] wrote: Medication: metoprolol  tartrate (LOPRESSOR ) 100 MG tablet, amLODipine  (NORVASC ) 5 MG tablet  Has the patient contacted their pharmacy? No   This is the patient's preferred pharmacy:    [facility] [address] [place], [place] - [address] [name] [address] Phone: [phone] Fax: [phone]  Is this the correct pharmacy for this prescription? Yes If no, delete pharmacy and type the correct one.   Has the prescription been filled recently? No  Is the patient out of the medication? No but she only has a few left  Has the patient been seen for an appointment in the last year OR does the patient have an upcoming appointment? Yes  Can we respond through MyChart? No  Please assist patient further as she is still in [facility] and will not be back until the end of the month. She knows she has a n appointment and to call if she cannot make it

## 2023-10-08 MED ORDER — METOPROLOL TARTRATE 100 MG PO TABS: 100.0000 mg | ORAL_TABLET | Freq: Two times a day (BID) | ORAL | 0 refills | Status: AC

## 2023-10-08 MED ORDER — AMLODIPINE BESYLATE 5 MG PO TABS
5.0000 mg | ORAL_TABLET | Freq: Every day | ORAL | 0 refills | Status: DC
Start: 2023-10-08 — End: 2023-10-18

## 2023-10-18 ENCOUNTER — Other Ambulatory Visit: Payer: Self-pay

## 2023-10-18 DIAGNOSIS — I10 Essential (primary) hypertension: Secondary | ICD-10-CM

## 2023-10-18 MED ORDER — AMLODIPINE BESYLATE 5 MG PO TABS: 5.0000 mg | ORAL_TABLET | Freq: Every day | ORAL | 0 refills | Status: AC

## 2023-10-25 ENCOUNTER — Encounter: Payer: Self-pay | Admitting: Nurse Practitioner

## 2023-11-21 ENCOUNTER — Encounter: Payer: Self-pay | Admitting: Nurse Practitioner

## 2023-11-26 ENCOUNTER — Ambulatory Visit: Payer: Self-pay | Admitting: Nurse Practitioner

## 2023-12-06 ENCOUNTER — Ambulatory Visit: Payer: Self-pay | Admitting: Nurse Practitioner
